# Patient Record
Sex: Female | Born: 1935 | Race: Black or African American | Hispanic: No | Smoking: Former smoker
Health system: Southern US, Community
[De-identification: ages and names within clinical notes are randomized; demographics above are authoritative.]

## PROBLEM LIST (undated history)

## (undated) DIAGNOSIS — H9190 Unspecified hearing loss, unspecified ear: Secondary | ICD-10-CM

## (undated) DIAGNOSIS — H269 Unspecified cataract: Secondary | ICD-10-CM

## (undated) DIAGNOSIS — E7800 Pure hypercholesterolemia, unspecified: Secondary | ICD-10-CM

## (undated) DIAGNOSIS — F1010 Alcohol abuse, uncomplicated: Secondary | ICD-10-CM

## (undated) DIAGNOSIS — H04123 Dry eye syndrome of bilateral lacrimal glands: Secondary | ICD-10-CM

## (undated) DIAGNOSIS — D649 Anemia, unspecified: Secondary | ICD-10-CM

## (undated) DIAGNOSIS — R4701 Aphasia: Secondary | ICD-10-CM

## (undated) DIAGNOSIS — N189 Chronic kidney disease, unspecified: Secondary | ICD-10-CM

## (undated) DIAGNOSIS — I10 Essential (primary) hypertension: Secondary | ICD-10-CM

## (undated) DIAGNOSIS — H9193 Unspecified hearing loss, bilateral: Secondary | ICD-10-CM

## (undated) DIAGNOSIS — E785 Hyperlipidemia, unspecified: Secondary | ICD-10-CM

## (undated) DIAGNOSIS — N184 Chronic kidney disease, stage 4 (severe): Secondary | ICD-10-CM

## (undated) DIAGNOSIS — F0390 Unspecified dementia without behavioral disturbance: Secondary | ICD-10-CM

## (undated) HISTORY — PX: CATARACT EXTRACTION W/ INTRAOCULAR LENS IMPLANT: SHX1309

## (undated) HISTORY — PX: EYE SURGERY: SHX253

## (undated) HISTORY — DX: Unspecified hearing loss, bilateral: H91.93

## (undated) HISTORY — DX: Aphasia: R47.01

## (undated) HISTORY — DX: Chronic kidney disease, stage 4 (severe): N18.4

---

## 1999-09-13 ENCOUNTER — Encounter: Admission: RE | Admit: 1999-09-13 | Discharge: 1999-09-13 | Payer: Self-pay | Admitting: Internal Medicine

## 1999-09-13 ENCOUNTER — Encounter: Payer: Self-pay | Admitting: Internal Medicine

## 2004-02-27 ENCOUNTER — Ambulatory Visit (HOSPITAL_COMMUNITY): Admission: RE | Admit: 2004-02-27 | Discharge: 2004-02-27 | Payer: Self-pay | Admitting: Family Medicine

## 2005-03-03 ENCOUNTER — Encounter: Admission: RE | Admit: 2005-03-03 | Discharge: 2005-03-03 | Payer: Self-pay | Admitting: Family Medicine

## 2006-02-03 ENCOUNTER — Inpatient Hospital Stay (HOSPITAL_COMMUNITY): Admission: EM | Admit: 2006-02-03 | Discharge: 2006-02-04 | Payer: Self-pay | Admitting: Emergency Medicine

## 2006-03-30 ENCOUNTER — Encounter: Admission: RE | Admit: 2006-03-30 | Discharge: 2006-03-30 | Payer: Self-pay | Admitting: Family Medicine

## 2006-07-14 ENCOUNTER — Emergency Department (HOSPITAL_COMMUNITY): Admission: EM | Admit: 2006-07-14 | Discharge: 2006-07-14 | Payer: Self-pay | Admitting: Family Medicine

## 2006-09-25 ENCOUNTER — Emergency Department (HOSPITAL_COMMUNITY): Admission: EM | Admit: 2006-09-25 | Discharge: 2006-09-25 | Payer: Self-pay | Admitting: Emergency Medicine

## 2007-01-27 ENCOUNTER — Inpatient Hospital Stay (HOSPITAL_COMMUNITY): Admission: EM | Admit: 2007-01-27 | Discharge: 2007-01-29 | Payer: Self-pay | Admitting: Emergency Medicine

## 2007-01-27 ENCOUNTER — Ambulatory Visit: Payer: Self-pay | Admitting: Internal Medicine

## 2007-04-24 ENCOUNTER — Encounter: Admission: RE | Admit: 2007-04-24 | Discharge: 2007-04-24 | Payer: Self-pay | Admitting: Family Medicine

## 2007-06-22 ENCOUNTER — Ambulatory Visit (HOSPITAL_COMMUNITY): Admission: RE | Admit: 2007-06-22 | Discharge: 2007-06-22 | Payer: Self-pay | Admitting: Orthopaedic Surgery

## 2007-12-18 ENCOUNTER — Encounter: Admission: RE | Admit: 2007-12-18 | Discharge: 2007-12-18 | Payer: Self-pay | Admitting: Family Medicine

## 2008-03-17 ENCOUNTER — Encounter: Admission: RE | Admit: 2008-03-17 | Discharge: 2008-03-17 | Payer: Self-pay | Admitting: Family Medicine

## 2008-04-08 ENCOUNTER — Emergency Department (HOSPITAL_COMMUNITY): Admission: EM | Admit: 2008-04-08 | Discharge: 2008-04-09 | Payer: Self-pay | Admitting: Emergency Medicine

## 2009-03-23 ENCOUNTER — Encounter: Admission: RE | Admit: 2009-03-23 | Discharge: 2009-03-23 | Payer: Self-pay | Admitting: Family Medicine

## 2009-05-15 ENCOUNTER — Emergency Department (HOSPITAL_COMMUNITY): Admission: EM | Admit: 2009-05-15 | Discharge: 2009-05-16 | Payer: Self-pay | Admitting: Emergency Medicine

## 2010-05-16 ENCOUNTER — Encounter: Payer: Self-pay | Admitting: Family Medicine

## 2010-07-12 LAB — DIFFERENTIAL
Basophils Absolute: 0 10*3/uL (ref 0.0–0.1)
Basophils Relative: 0 % (ref 0–1)
Eosinophils Absolute: 0.1 10*3/uL (ref 0.0–0.7)
Eosinophils Relative: 2 % (ref 0–5)
Lymphocytes Relative: 30 % (ref 12–46)
Lymphs Abs: 1.7 10*3/uL (ref 0.7–4.0)
Monocytes Absolute: 0.7 10*3/uL (ref 0.1–1.0)
Monocytes Relative: 12 % (ref 3–12)
Neutro Abs: 3.2 10*3/uL (ref 1.7–7.7)
Neutrophils Relative %: 57 % (ref 43–77)

## 2010-07-12 LAB — COMPREHENSIVE METABOLIC PANEL
ALT: 22 U/L (ref 0–35)
AST: 28 U/L (ref 0–37)
Albumin: 4 g/dL (ref 3.5–5.2)
Alkaline Phosphatase: 68 U/L (ref 39–117)
BUN: 13 mg/dL (ref 6–23)
CO2: 28 mEq/L (ref 19–32)
Calcium: 9.8 mg/dL (ref 8.4–10.5)
Chloride: 102 mEq/L (ref 96–112)
Creatinine, Ser: 1.29 mg/dL — ABNORMAL HIGH (ref 0.4–1.2)
GFR calc Af Amer: 49 mL/min — ABNORMAL LOW (ref >60)
GFR calc non Af Amer: 40 mL/min — ABNORMAL LOW (ref >60)
Glucose, Bld: 162 mg/dL — ABNORMAL HIGH (ref 70–99)
Potassium: 3.6 mEq/L (ref 3.5–5.1)
Sodium: 137 mEq/L (ref 135–145)
Total Bilirubin: 0.3 mg/dL (ref 0.3–1.2)
Total Protein: 7 g/dL (ref 6.0–8.3)

## 2010-07-12 LAB — CBC
HCT: 33.4 % — ABNORMAL LOW (ref 36.0–46.0)
Hemoglobin: 11.2 g/dL — ABNORMAL LOW (ref 12.0–15.0)
MCHC: 33.5 g/dL (ref 30.0–36.0)
MCV: 97.1 fL (ref 78.0–100.0)
Platelets: 272 10*3/uL (ref 150–400)
RBC: 3.44 MIL/uL — ABNORMAL LOW (ref 3.87–5.11)
RDW: 12.8 % (ref 11.5–15.5)
WBC: 5.7 10*3/uL (ref 4.0–10.5)

## 2010-07-12 LAB — URINALYSIS, ROUTINE W REFLEX MICROSCOPIC
Bilirubin Urine: NEGATIVE
Glucose, UA: NEGATIVE mg/dL
Hgb urine dipstick: NEGATIVE
Ketones, ur: NEGATIVE mg/dL
Nitrite: NEGATIVE
Protein, ur: NEGATIVE mg/dL
Specific Gravity, Urine: 1.01 (ref 1.005–1.030)
Urobilinogen, UA: 0.2 mg/dL (ref 0.0–1.0)
pH: 6.5 (ref 5.0–8.0)

## 2010-09-07 NOTE — Discharge Summary (Signed)
[name], [name]              ACCOUNT NO.:  [id number]   MEDICAL RECORD NO.:  [id number]          PATIENT TYPE:  INP   LOCATION:  [id number]                         FACILITY:  [place]   PHYSICIAN:  Evette Doffing, M.D.  DATE OF BIRTH:  August 18, 1932   DATE OF ADMISSION:  01/27/2007  DATE OF DISCHARGE:  01/29/2007                               DISCHARGE SUMMARY   DISCHARGE DIAGNOSES:  Include:  1. Right hip pain secondary to fall, no fractures.  2. Hyponatremia secondary to hydrochlorothiazide & possibly Lexapro,      now corrected.  3. Hypertension, controlled on atenolol.  4. Anemia, stable.  5. Dementia, no symptoms during this hospitalization.  6. Hyperlipidemia.  7. Cough.   DICHARGE MEDICATIONS  1. Atenolol 50 mg once daily  2. Zyprexa 2.5 mg every am and 5 mg at bedtime.  3. Advicor 1000/20 mg every evening  4. Aspirin 325 mg once daily  5. Aricept 10 mg daily at bedtime.  6. Tricor 145 mg once dialy  7. Ibuprofin 800mg  prn for pain  8. Docusate one tablet daily  9. Ost-Cal 500mg  daily  10.Risperdol 2mg  daily at bedtime.   Patient was advised to stop taking the following medications: HCTZ (2/2  low sodium), Lexapro (2/2 low sodium), Lisinopril (2/2 cough).   DISPOSITION:  Patient is being discharged in very good condition back to  her residence at [facility].  She will be following up with  Dr. Silvio Pate of [facility] on October 14.  At the nursing  home, her B-Met needs to be checked at least weekly until stability of  her sodium is confirmed.   PROCEDURES PERFORMED DURING THIS HOSPITALIZATION:  Include:  1. Chest x-ray October 4:  No acute pulmonary process, stable      cardiomegaly.  2. Pelvic x-ray October 4 which shows osteopenia, no bone abnormality.  3. Abdominal x-ray October 4 shows nonobstructive bowel gas pattern.  4. Left shoulder x-ray October 4 shows mild degenerative changes      including small spurs about the acromioclavicular  joint, small      subchondral cyst or geode in humeral head, negative for fracture      dislocation.  5. X-ray right hip October 4:  No fracture dislocation.  6. X-ray right ribs October 4:  Shallow lung volumes, no displaced rib      fracture or pneumothorax.   There were no consultations during this hospitalization.   ADMITTING HISTORY AND PHYSICAL IS AS FOLLOWS:  [name] is a 77-year-  old, female, nursing home resident with past medical history of  hypertension, anemia, hyperlipidemia, dementia, who is currently on low-  salt diet at her nursing home.  She presents after a fall complaining of  abdominal/hip pain.  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  Patient fell in the morning of admission while getting out  of the shower.  She landed on her right hip; there was no trauma to her  head, no pain anywhere else.  Right hip is sore, has full range of  motion, no bruising, no  abrasion.  She can still hold weight on the  right hip.  She reports that she did feel lightheaded while falling with  no loss of consciousness.  No nausea, vomiting or diarrhea in the past  days.  No shortness of breath, no chest pain, no palpitations.  Patient  reports she has been eating and drinking well and has been feeling fine  in past days.  She does report a cough at night prior to admission  productive of a thick clear sputum.  She reports occasional subjective  fevers, also leg cramping in the calves and feet.  Patient has chronic  left shoulder pain which is achy in nature.  In the emergency room, labs  showed the patient to be hyponatremic with a sodium of 125, chloride of  91.  Patient was admitted mainly for electrolyte correction.   On admission, sodium 125, potassium 3.5, chloride 91, bicarb 27, BUN 11,  creatinine 1.2, glucose 117, bilirubin 0.8, alk phos 59, SGOT 40, SGPT  24, protein 6.9, albumin 4.2, calcium 10.  White blood cell count 4.2,  hemoglobin 10.4,  hematocrit 30.4, platelets 365 with an MCV of 93.0.  UA  with nitrite and leukocyte esterase-negative.   VITALS ON PRESENTATION:  Temperature 97.9, blood pressure 119/78, pulse  58, respirations 20.  Patient was sating 99% on room air.   EXAMINATION:  Benign except for tenderness in the right hip to  palpation, no bruising, no edema, full range of motion, strength 5/5.   HOSPITAL COURSE:  1. Hyponatremia, corrected on 200 ml per hour of normal saline for 48      hours.  HCTZ has been discontinued as has the patient's      prescription for Lexapro, as Lexapro can also contribute to      hyponatremia.  My recommendation is for a normal diet at the      nursing home and that she remains off diuretics and Lexapro.  2. Hip pain.  No fracture on x-ray.  Patient has been up and walking      in hospital.  She states her pain to be minimal.  Continue      ibuprofen p.r.n.  3. Hypertension.  Patient is 144/85 on day of discharge.  This is      increased after fluid supplementation, also with the      discontinuation of her hydrochlorothiazide and lisinopril.  In the      future, may consider an ARB for blood pressure control rather than      an ACE, as patient's reports cough and suggests cough is worse when      she is on lisinopril.  Patient should continue atenolol and blood      pressure to be managed in the outpatient setting.  4. Anemia.  Stable throughout this hospitalization.  5. Dementia.  Patient showed absolutely no signs or symptoms of      dementia during this hospitalization.  Previous records question of      Alzheimer disease versus multi-infarct dementia.  During this      hospitalization, patient has been alert, oriented and appropriate.      Lexapro has been discontinued.  Patient continues on Aricept,      Risperdal and Zyprexa as well as Trazodone.  My recommendation      would be to discontinue one or several of these medications, as      this patient does not seem to  exhibit any signs or symptoms of  dementia/psychosis/depression.  6. Hyperlipidemia.  This issue was not explored during this      hospitalization.  Patient should continue on Loiza.  7. Cough.  Patient suggests that cough is worse when on lisinopril.      Lisinopril has been discontinued.  Patient still has cough, but has      only been off lisinopril for less than 24 hours.  Chest x-ray shows      lungs to be clear.  There are no fevers, no sputum production, no      increase in white blood cell count.   This patient is being discharged in stable condition back to her  residence at [facility].   On day of discharge, sodium 136, potassium 3.7, chloride 105, bicarb 26,  BUN 8, creatinine 1.08, glucose 106, calcium 9.3, hemoglobin 9.1,  hematocrit 26.8, white blood cell count 3.8, platelet count 328,  phosphorous 2.7, magnesium 2.1.   Other labs of interest during this hospitalization:  TSH was shown to be  low at 0.23, slightly low for the parameters of this hospital.  Suggest  that free T4, free T3 be checked to rule out hyperthyroidism.      Myrtis Ser, MD  Electronically Signed      Evette Doffing, M.D.  Electronically Signed    CW/MEDQ  D:  01/29/2007  T:  01/29/2007  Job:  [id number]

## 2010-09-07 NOTE — Op Note (Signed)
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   PROCEDURE:  Open reduction and pinning of left middle finger distal  interphalangeal joint and removal of necrotic bone.   SURGEON:  Sierra Guest. Ninfa Linden, MD   ANESTHESIA:  1. Left arm Bier block.  2. Sensorcaine 0.25% plain digital block, left middle finger.   TOURNIQUET TIME:  56 minutes.   BLOOD LOSS:  Minimal.   COMPLICATIONS:  None.   INDICATIONS:  Briefly, [name] is a 77 year old deaf individual who  sometime last fall sustained an injury to her left middle finger.  X-  rays were eventually obtained and this showed a bony mallet deformity.  She eventually presented to my office with wanting to have this fixed.  It was uncomfortable and painful to her at the DIP joint.  I talked to  her and the family at length about the likelihood of needing to fuse  this joint but that we would try a surgical intervention.  The risks and  benefits of this including, again, the risk of needing to fuse the  joint, were explained and understood and they agreed to proceed with  surgery.   PROCEDURE DESCRIPTION:  After informed consent was obtained, the  appropriate left arm was marked.  She was brought to the operating room  and placed supine on the operating table.  A Bier block was obtained  through the forearm tourniquet.  The arm was prepped and draped with  DuraPrep and sterile drapes.  A time-out was  called and she was  identified as the correct patient and correct extremity.  I then made an  incision directly over the DIP joint and there was a large bony fragment  that was encountered after I divided the extensor tendon.  The extensor  tendon was intact, though.  The bony fragment was necrotic and I had to  remove it in its entirety.  I then made a decision to attempt to pin the  joint without fusing it because there was some residual cartilage that  was still remaining in the palmar aspect of the DIP joint.  I placed a  single 0.045 Kirschner wire from the tip of the finger in a retrograde  manner through the distal phalanx into the middle phalanx.  This did  hold the finger in an anatomic position.  I then irrigated the wound,  cut the pin off at the skin and put a pin cap.  I then closed the  extensor tendon with 3-0 Ethibond suture followed by 3-0 nylon suture in  the skin.  The digital block was  obtained with 0.25% __________ .  I then placed Xeroform and a well-padded sterile dressing including a  splint that also keeps the finger in an extended position of the DIP  joint.  The patient was taken to the recovery room in stable condition.  All final counts were correct and there were no complications noted.      [name], M.D.  Electronically Signed     CYB/MEDQ  D:  06/22/2007  T:  06/23/2007  Job:  [id number]

## 2010-09-10 NOTE — H&P (Signed)
NAMENAVREET, FORINO              ACCOUNT NO.:  [id number]   MEDICAL RECORD NO.:  [id number]          PATIENT TYPE:  EMS   LOCATION:  MAJO                         FACILITY:  [place]   PHYSICIAN:  Mobolaji B. Bakare, M.D.DATE OF BIRTH:  Nov 08, 1932   DATE OF ADMISSION:  02/02/2006  DATE OF DISCHARGE:                                HISTORY & PHYSICAL   PRIMARY CARE PHYSICIAN:  Dr. Lisbeth Ply.   The patient is a resident of [facility].   CHIEF COMPLAINT:  Chest pain.   HISTORY OF PRESENTING COMPLAINT:  [name] is a 77 year old African-  American female who has been deaf and mute since childhood.  This afternoon  she complained of chest pain radiating to her left arm.  She felt cold but  not diaphoretic.  There was no nausea or vomiting associated with it, but  she did feel palpitation.  EMS was called to the scene.  The patient  received 3 nitroglycerin and chest pain improved remarkably, and she was  brought to the emergency room for further evaluation.  She is currently  chest pain free.  Initial EKG showed normal sinus rhythm with no acute ST  changes.  Cardiac markers at the point of care have been negative so far.  The patient is being admitted to rule out myocardial infarction.   Most of the history was obtained via interpretation by daughter.   REVIEW OF SYSTEMS:  She denies fever.  No shortness of breath.  No cough,  orthopnea, PND.  No abdominal pain, constipation, diarrhea.   PAST MEDICAL HISTORY:  1. Dementia.  2. Schizophrenia.  3. Adjustment disorder.  4. History of alcohol abuse.  5. Hypertension.  6. History of anemia.  7. Hypercholesterolemia.   CURRENT MEDICATIONS:  Include:  1. Aricept 10 mg p.o. q.h.s.  2. Risperdal 2 mg q.h.s.  3. Zyprexa 5 mg q.h.s., and 2.5 mg q. a.m.  4. Trazodone 50 mg p.o. q.h.s.  5. Advicor 20/500 mg 1 p.o. q.h.s.  6. Ecotrin 325 mg q.h.s.  7. Tenormin 50 mg p.o. daily.  8. Oyster 500 mg 1 tablet daily.  9. TriCor  145 mg p.o. daily.  10.Lexapro 10 mg p.o. daily.  11.Maxzide 37.5/25 mg 1 p.o. daily.  12.Ferrous sulfate elixir 5 mL p.o. t.i.d.  13.Colace 100 mg p.o. q.h.s.  14.Therems 1 p.o. daily.   ALLERGIES:  No known drug allergies.   FAMILY HISTORY:  Noncontributory.   SOCIAL HISTORY:  The patient is a resident of [facility].  She is  fairly independent with activities of daily living.  Limitations include  verbal communication and she is deaf.  She uses sign language.   INITIAL VITALS:  Temperature 98.7, blood pressure 103/73, pulse of 59,  respiratory rate of 16, O2 sat of 98%.   PHYSICAL EXAMINATION:  GENERAL:  The patient appears comfortable, not in  respiratory distress.  HEENT:  Normocephalic, atraumatic head.  Pupils equal, round, and reactive  to light.  NECK:  No elevated JVD.  No carotid bruits.  LUNGS:  Clear clinically to auscultation.  CVS:  S1, S2,  regular.  No murmur.  No gallop.  ABDOMEN:  Obese, soft, nontender.  Bowel sounds present.  EXTREMITIES:  No pedal edema.  No calf tenderness.  Dorsalis pedis pulses 2+  bilaterally.  CNS:  Nonfocal.   INITIAL LABORATORY DATA:  Cardiac markers at the point of care:  First set  showed myoglobin of 215, troponin and CK-MB normal; second set showed  myoglobin of 245 but troponin and CK-MB are also normal.  Sodium 132,  potassium 3.3, chloride 98, glucose 116, BUN 17, creatinine 1.6.  White cell  5.1, hemoglobin 11.1, hematocrit 32.2, platelets 356.  Normal differential.  Chest x-ray showed no acute cardiopulmonary abnormality.  Borderline  cardiomegaly.  No overt pulmonary edema or focal infiltrates.   ASSESSMENT:  1. Chest pain rule out myocardial infarction.  2. Hypertension.  3. Dementia.  4. History of schizophrenia.  5. Hard of hearing/mute.  6. Hypokalemia.  7. Renal insufficiency.   PLAN:  Admit to Telemetry Floor.  Cycle cardiac enzymes.  Nitro paste 1/2  inch every 4 hours.  Aspirin 325 mg p.o. daily.   We will resume all other  home medications including beta blocker.      Mobolaji B. Maia Petties, M.D.  Electronically Signed    MBB/MEDQ  D:  02/03/2006  T:  02/03/2006  Job:  [id number]   cc:   Dr. Lisbeth Ply

## 2010-12-22 ENCOUNTER — Emergency Department (HOSPITAL_COMMUNITY): Payer: Medicare Other

## 2010-12-22 ENCOUNTER — Inpatient Hospital Stay (HOSPITAL_COMMUNITY)
Admission: EM | Admit: 2010-12-22 | Discharge: 2010-12-24 | DRG: 149 | Disposition: A | Discharge status: Home Health | Payer: Medicare Other | Source: Ambulatory Visit | Attending: Internal Medicine | Admitting: Internal Medicine

## 2010-12-22 ENCOUNTER — Other Ambulatory Visit (HOSPITAL_COMMUNITY): Payer: Medicare Other

## 2010-12-22 DIAGNOSIS — I129 Hypertensive chronic kidney disease with stage 1 through stage 4 chronic kidney disease, or unspecified chronic kidney disease: Secondary | ICD-10-CM | POA: Diagnosis present

## 2010-12-22 DIAGNOSIS — M25569 Pain in unspecified knee: Secondary | ICD-10-CM | POA: Diagnosis present

## 2010-12-22 DIAGNOSIS — F068 Other specified mental disorders due to known physiological condition: Secondary | ICD-10-CM | POA: Diagnosis present

## 2010-12-22 DIAGNOSIS — H8190 Unspecified disorder of vestibular function, unspecified ear: Principal | ICD-10-CM | POA: Diagnosis present

## 2010-12-22 DIAGNOSIS — E119 Type 2 diabetes mellitus without complications: Secondary | ICD-10-CM | POA: Diagnosis present

## 2010-12-22 DIAGNOSIS — H9190 Unspecified hearing loss, unspecified ear: Secondary | ICD-10-CM | POA: Diagnosis present

## 2010-12-22 DIAGNOSIS — F1010 Alcohol abuse, uncomplicated: Secondary | ICD-10-CM | POA: Diagnosis present

## 2010-12-22 DIAGNOSIS — N189 Chronic kidney disease, unspecified: Secondary | ICD-10-CM | POA: Diagnosis present

## 2010-12-22 DIAGNOSIS — E7800 Pure hypercholesterolemia, unspecified: Secondary | ICD-10-CM | POA: Diagnosis present

## 2010-12-22 LAB — BASIC METABOLIC PANEL
BUN: 14 mg/dL (ref 6–23)
CO2: 30 mEq/L (ref 19–32)
Calcium: 10.4 mg/dL (ref 8.4–10.5)
Chloride: 101 mEq/L (ref 96–112)
Creatinine, Ser: 1.12 mg/dL — ABNORMAL HIGH (ref 0.50–1.10)
GFR calc Af Amer: 57 mL/min — ABNORMAL LOW (ref >60)
GFR calc non Af Amer: 47 mL/min — ABNORMAL LOW (ref >60)
Glucose, Bld: 87 mg/dL (ref 70–99)
Potassium: 3.6 mEq/L (ref 3.5–5.1)
Sodium: 138 mEq/L (ref 135–145)

## 2010-12-22 LAB — CARDIAC PANEL(CRET KIN+CKTOT+MB+TROPI)
CK, MB: 7.5 ng/mL (ref 0.3–4.0)
Relative Index: 1.2 (ref 0.0–2.5)
Total CK: 609 U/L — ABNORMAL HIGH (ref 7–177)
Troponin I: <0.3 ng/mL (ref <0.30)

## 2010-12-22 LAB — URINALYSIS, ROUTINE W REFLEX MICROSCOPIC
Bilirubin Urine: NEGATIVE
Glucose, UA: NEGATIVE mg/dL
Hgb urine dipstick: NEGATIVE
Ketones, ur: NEGATIVE mg/dL
Leukocytes, UA: NEGATIVE
Nitrite: NEGATIVE
Protein, ur: NEGATIVE mg/dL
Specific Gravity, Urine: 1.007 (ref 1.005–1.030)
Urobilinogen, UA: 0.2 mg/dL (ref 0.0–1.0)
pH: 7 (ref 5.0–8.0)

## 2010-12-22 LAB — CBC
HCT: 31.7 % — ABNORMAL LOW (ref 36.0–46.0)
Hemoglobin: 11.1 g/dL — ABNORMAL LOW (ref 12.0–15.0)
MCH: 32.6 pg (ref 26.0–34.0)
MCHC: 35 g/dL (ref 30.0–36.0)
MCV: 93 fL (ref 78.0–100.0)
Platelets: 246 10*3/uL (ref 150–400)
RBC: 3.41 MIL/uL — ABNORMAL LOW (ref 3.87–5.11)
RDW: 12.5 % (ref 11.5–15.5)
WBC: 5 10*3/uL (ref 4.0–10.5)

## 2010-12-22 LAB — GLUCOSE, CAPILLARY: Glucose-Capillary: 100 mg/dL — ABNORMAL HIGH (ref 70–99)

## 2010-12-22 LAB — DIFFERENTIAL
Basophils Absolute: 0 10*3/uL (ref 0.0–0.1)
Basophils Relative: 0 % (ref 0–1)
Eosinophils Absolute: 0.1 10*3/uL (ref 0.0–0.7)
Eosinophils Relative: 1 % (ref 0–5)
Lymphocytes Relative: 42 % (ref 12–46)
Lymphs Abs: 2.1 10*3/uL (ref 0.7–4.0)
Monocytes Absolute: 0.5 10*3/uL (ref 0.1–1.0)
Monocytes Relative: 11 % (ref 3–12)
Neutro Abs: 2.3 10*3/uL (ref 1.7–7.7)
Neutrophils Relative %: 46 % (ref 43–77)

## 2010-12-22 LAB — POCT I-STAT TROPONIN I: Troponin i, poc: 0 ng/mL (ref 0.00–0.08)

## 2010-12-23 ENCOUNTER — Inpatient Hospital Stay (HOSPITAL_COMMUNITY): Payer: Medicare Other

## 2010-12-23 ENCOUNTER — Other Ambulatory Visit (HOSPITAL_COMMUNITY): Payer: Medicare Other

## 2010-12-23 LAB — LIPID PANEL
Cholesterol: 127 mg/dL (ref 0–200)
HDL: 49 mg/dL (ref >39)
LDL Cholesterol: 61 mg/dL (ref 0–99)
Total CHOL/HDL Ratio: 2.6 RATIO
Triglycerides: 84 mg/dL (ref <150)
VLDL: 17 mg/dL (ref 0–40)

## 2010-12-23 LAB — GLUCOSE, CAPILLARY
Glucose-Capillary: 193 mg/dL — ABNORMAL HIGH (ref 70–99)
Glucose-Capillary: 126 mg/dL — ABNORMAL HIGH (ref 70–99)

## 2010-12-23 LAB — COMPREHENSIVE METABOLIC PANEL
ALT: 18 U/L (ref 0–35)
AST: 21 U/L (ref 0–37)
Albumin: 3.7 g/dL (ref 3.5–5.2)
Alkaline Phosphatase: 64 U/L (ref 39–117)
BUN: 13 mg/dL (ref 6–23)
CO2: 31 mEq/L (ref 19–32)
Calcium: 9.9 mg/dL (ref 8.4–10.5)
Chloride: 102 mEq/L (ref 96–112)
Creatinine, Ser: 1.08 mg/dL (ref 0.50–1.10)
GFR calc Af Amer: 60 mL/min — ABNORMAL LOW (ref >60)
GFR calc non Af Amer: 49 mL/min — ABNORMAL LOW (ref >60)
Glucose, Bld: 118 mg/dL — ABNORMAL HIGH (ref 70–99)
Potassium: 3.6 mEq/L (ref 3.5–5.1)
Sodium: 137 mEq/L (ref 135–145)
Total Bilirubin: 0.3 mg/dL (ref 0.3–1.2)
Total Protein: 6.6 g/dL (ref 6.0–8.3)

## 2010-12-23 LAB — HEMOGLOBIN A1C
Hgb A1c MFr Bld: 7.5 % — ABNORMAL HIGH (ref <5.7)
Hgb A1c MFr Bld: 7.1 % — ABNORMAL HIGH (ref <5.7)
Mean Plasma Glucose: 169 mg/dL — ABNORMAL HIGH (ref <117)
Mean Plasma Glucose: 157 mg/dL — ABNORMAL HIGH (ref <117)

## 2010-12-23 LAB — CARDIAC PANEL(CRET KIN+CKTOT+MB+TROPI)
CK, MB: 5.9 ng/mL — ABNORMAL HIGH (ref 0.3–4.0)
CK, MB: 7.1 ng/mL (ref 0.3–4.0)
Relative Index: 1.2 (ref 0.0–2.5)
Relative Index: 1.3 (ref 0.0–2.5)
Total CK: 510 U/L — ABNORMAL HIGH (ref 7–177)
Total CK: 528 U/L — ABNORMAL HIGH (ref 7–177)
Troponin I: <0.3 ng/mL (ref <0.30)
Troponin I: <0.3 ng/mL (ref <0.30)

## 2010-12-23 LAB — CBC
HCT: 30.4 % — ABNORMAL LOW (ref 36.0–46.0)
Hemoglobin: 10.4 g/dL — ABNORMAL LOW (ref 12.0–15.0)
MCH: 31.7 pg (ref 26.0–34.0)
MCHC: 34.2 g/dL (ref 30.0–36.0)
MCV: 92.7 fL (ref 78.0–100.0)
Platelets: 251 10*3/uL (ref 150–400)
RBC: 3.28 MIL/uL — ABNORMAL LOW (ref 3.87–5.11)
RDW: 12.4 % (ref 11.5–15.5)
WBC: 5.3 10*3/uL (ref 4.0–10.5)

## 2010-12-23 LAB — MAGNESIUM: Magnesium: 2 mg/dL (ref 1.5–2.5)

## 2010-12-23 LAB — MRSA PCR SCREENING: MRSA by PCR: NEGATIVE

## 2010-12-23 LAB — PHOSPHORUS: Phosphorus: 3.3 mg/dL (ref 2.3–4.6)

## 2010-12-23 MED ORDER — GADOBENATE DIMEGLUMINE 529 MG/ML IV SOLN
20.0000 mL | Freq: Once | INTRAVENOUS | Status: AC
  Administered 2010-12-23: 20 mL via INTRAVENOUS

## 2010-12-24 LAB — BASIC METABOLIC PANEL
BUN: 15 mg/dL (ref 6–23)
CO2: 31 mEq/L (ref 19–32)
Calcium: 10.1 mg/dL (ref 8.4–10.5)
Chloride: 101 mEq/L (ref 96–112)
Creatinine, Ser: 1.11 mg/dL — ABNORMAL HIGH (ref 0.50–1.10)
GFR calc Af Amer: 58 mL/min — ABNORMAL LOW (ref >60)
GFR calc non Af Amer: 48 mL/min — ABNORMAL LOW (ref >60)
Glucose, Bld: 135 mg/dL — ABNORMAL HIGH (ref 70–99)
Potassium: 3.8 mEq/L (ref 3.5–5.1)
Sodium: 138 mEq/L (ref 135–145)

## 2010-12-25 NOTE — Discharge Summary (Signed)
[name], [name] NO.:  [id number]  MEDICAL RECORD NO.:  [id number]  LOCATION:  62                         FACILITY:  [place]  PHYSICIAN:  Sherryl Manges, M.D.  DATE OF BIRTH:  02/28/1933  DATE OF ADMISSION:  12/22/2010 DATE OF DISCHARGE:  12/24/2010                              DISCHARGE SUMMARY   PRIMARY CARE PHYSICIAN:  Daiva Eves, MD  DISCHARGE DIAGNOSES: 1. Postural dizziness, likely vestibular in etiology. 2. Hypertension. 3. Dementia. 4. Deafness. 5. Dyslipidemia. 6. Osteoarthritis/degenerative disk disease. 7. Type 2 diabetes mellitus.  DISCHARGE MEDICATIONS: 1. Meclizine 12.5 mg p.o. t.i.d. for 14 days. 2. Tramadol 50 mg p.o. t.i.d. 3. Acetaminophen 1 g p.o. p.r.n. t.i.d. for pain. 4. Carvedilol 25 mg p.o. b.i.d. 5. Clonidine 0.3 mg p.o. b.i.d. 6. Aricept 10 mg p.o. at bedtime. 7. Doxazosin 8 mg p.o. q.evening. 8. Citalopram 10 mg p.o. daily. 9. Glimepiride 4 mg p.o. b.i.d. 10.Ibuprofen 600 mg p.o. p.r.n. t.i.d. for pain. 11.Losartan 100 mg p.o. daily. 12.Lovastatin 40 mg p.o. q.evening. 13.Niaspan 1000 mg p.o. q.evening. 14.Olanzapine 2.5 mg p.o. daily. 15.Olanzapine 5 mg p.o. q.evening. 16.Risperdal 2 mg p.o. q.evening. 17.Trazodone 5 mg p.o. q.evening. 18.Trilipix 135 mg p.o. daily.  PROCEDURES: 1. Head CT scan, December 18, 2010.  This showed chronic microvascular     ischemia.  No acute abnormality. 2. X-ray left knee, December 22, 2010.  This showed left knee     osteoarthritis.  No acute osseous findings. 3. X-ray right shoulder, December 22, 2010.  This was negative for     fracture, although there was rotator cuff disease with narrowing of     acromiohumeral distance.  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 4. X-ray of the right humerus, December 22, 2010, this was negative for  fracture. 5. X-ray of the hip, December 22, 2010, this showed mild degenerative     changes bilateral hips.  No acute bony abnormality. 6. Brain MRI, December 23, 2010, this showed no acute findings in the     brain that was mild to moderate for age, nonspecific, signal     changes most commonly due to small vessel disease, suspicious of     abnormal marrow signal in the posterior skull in the circumference     of the lambdoid suture associated with sclerosis on the comparison     CT; however, the finding was present in January 2011 and only mild     depending progression since that time as the case is an aggressive     process. 7. Brain MRA December 23, 2010.  This was a negative intracranial MRA. 8. MRA of the neck, December 23, 2010.  This showed no significant     carotid stenosis, left vertebral artery was dominant, irregular     right vertebral artery may be hypoplastic to contain some     atherosclerotic disease. 9. Brain MRI with contrast, December 23, 2010.  This was negative for     metastatic disease to the brain, noted there was an enhancing  sclerotic lesion in the occipital bone bilaterally, right greater     than left.  CONSULTATIONS:  None.  ADMISSION HISTORY:  As in H&P notes of December 22, 2010, dictated by Dr. Donia Ast.  However, in brief this is a 77 year old female, with known history of deafness, hypertension, chronic renal insufficiency with baseline creatinine of 1.2, dyslipidemia, type 2 diabetes mellitus, dementia, osteoarthritis/degenerative joint disease, presenting with postural dizziness for 1 day.  She had no associated focal neurology. She was subsequently admitted for evaluation, investigation, and management.  CLINICAL COURSE: 1. Postural dizziness.  The patient presented as described above.  Of     course, communication was difficult, as she communicates with     sign language and also by means of written notes.  However, she     underwent brain  imaging studies as well as head and neck MRA.  The     imaging studies, were unremarkable for acute pathology.  She     was commenced on meclizine and was evaluated by PT, OT.  During the     course of her hospitalization she had no recurrences of dizziness.  2. Dyslipidemia.  The patient's lipid profile is as follows.  Total     cholesterol 127, triglyceride 84, HDL 49, LDL 61, i.e., excellent     lipid profile.  She has been reassured accordingly.  3. Hypertension.  The patient is on multiple hypertensive medications     and she remained normotensive during the course of this     hospitalization except in the a.m. of December 24, 2010, when she had     a bump in BP, which resolved after administration of a.m.     antihypertensive medications.  4. Osteoarthritis.  The patient has degenerative joint disease.  The     patient did complain of left knee pain at the time of presentation,     physical examination demonstrated osteoarthritic changes.  No     significant effusion.  She was managed with analgesic medication.     Imaging studies showed DJD of the knee, no other acute findings.     She did complain also of right shoulder pain and imaging studies     demonstrated possible humeral head avascular necrosis as well as     severe degenerative changes.  She has been managed with analgesic     medications and physiotherapy with appreciable relief.  5. Type 2 diabetes mellitus.  The patient remained euglycemic during     the course of this hospitalization.  Hemoglobin A1c was 7.1.  6. Dementia.  The patient continues on preadmission psychotropic     medications.  DISPOSITION:  The patient on December 24, 2010, felt considerably better. Right shoulder and left knee pains have considerably ameliorated and she has had no dizziness during the course of this hospitalization.  She was, therefore, considered clinically stable for discharge and discharged accordingly.  Per PT/OT, the patient  will benefit from vestibular rehabilitation in the assisted living facility.  This has therefore been arranged.  The patient was discharged on December 24, 2010.  ACTIVITY:  As tolerated.  Recommended to increase activity slowly, otherwise per PT/OT.  DIET:  Heart-healthy/carbohydrate modified.  FOLLOWUP INSTRUCTIONS:  The patient will follow up routinely with her primary MD, Dr. Daiva Eves.  SPECIAL INSTRUCTIONS:  Home health PT/OT as well as vestibular rehabilitation has been arranged.     Sherryl Manges, M.D.     CO/MEDQ  D:  12/24/2010  T:  12/24/2010  Job:  [id number]  cc:   Daiva Eves, MD  Electronically Signed by Sherryl Manges M.D. on 12/25/2010 07:27:14 PM

## 2011-01-14 LAB — CBC
HCT: 32.2 — ABNORMAL LOW
Hemoglobin: 11.1 — ABNORMAL LOW
MCHC: 34.5
MCV: 95.6
Platelets: 288
RBC: 3.37 — ABNORMAL LOW
RDW: 12.8
WBC: 5.3

## 2011-01-14 LAB — BASIC METABOLIC PANEL
BUN: 12
CO2: 28
Calcium: 10.3
Chloride: 103
Creatinine, Ser: 1.09
GFR calc Af Amer: 60 — ABNORMAL LOW
GFR calc non Af Amer: 49 — ABNORMAL LOW
Glucose, Bld: 127 — ABNORMAL HIGH
Potassium: 4
Sodium: 138

## 2011-01-28 LAB — POCT I-STAT, CHEM 8
BUN: 15 mg/dL (ref 6–23)
Calcium, Ion: 1.09 mmol/L — ABNORMAL LOW (ref 1.12–1.32)
Chloride: 105 mEq/L (ref 96–112)
Creatinine, Ser: 1.5 mg/dL — ABNORMAL HIGH (ref 0.4–1.2)
Glucose, Bld: 114 mg/dL — ABNORMAL HIGH (ref 70–99)
HCT: 37 % (ref 36.0–46.0)
Hemoglobin: 12.6 g/dL (ref 12.0–15.0)
Potassium: 3.7 mEq/L (ref 3.5–5.1)
Sodium: 137 mEq/L (ref 135–145)
TCO2: 25 mmol/L (ref 0–100)

## 2011-01-28 LAB — TYPE AND SCREEN
ABO/RH(D): O POS
Antibody Screen: NEGATIVE

## 2011-01-28 LAB — URINE MICROSCOPIC-ADD ON

## 2011-01-28 LAB — URINALYSIS, ROUTINE W REFLEX MICROSCOPIC
Bilirubin Urine: NEGATIVE
Glucose, UA: NEGATIVE mg/dL
Ketones, ur: NEGATIVE mg/dL
Nitrite: NEGATIVE
Protein, ur: NEGATIVE mg/dL
Specific Gravity, Urine: 1.016 (ref 1.005–1.030)
Urobilinogen, UA: 0.2 mg/dL (ref 0.0–1.0)
pH: 7 (ref 5.0–8.0)

## 2011-01-28 LAB — ABO/RH: ABO/RH(D): O POS

## 2011-01-28 LAB — OCCULT BLOOD X 1 CARD TO LAB, STOOL: Fecal Occult Bld: NEGATIVE

## 2011-02-03 LAB — COMPREHENSIVE METABOLIC PANEL
ALT: 21
ALT: 24
AST: 33
AST: 40 — ABNORMAL HIGH
Albumin: 3.9
Albumin: 4.2
Alkaline Phosphatase: 55
Alkaline Phosphatase: 59
BUN: 11
BUN: 12
CO2: 24
CO2: 27
Calcium: 10
Calcium: 9.3
Chloride: 91 — ABNORMAL LOW
Chloride: 98
Creatinine, Ser: 1.22 — ABNORMAL HIGH
Creatinine, Ser: 1.24 — ABNORMAL HIGH
GFR calc Af Amer: 51 — ABNORMAL LOW
GFR calc Af Amer: 52 — ABNORMAL LOW
GFR calc non Af Amer: 43 — ABNORMAL LOW
GFR calc non Af Amer: 43 — ABNORMAL LOW
Glucose, Bld: 111 — ABNORMAL HIGH
Glucose, Bld: 117 — ABNORMAL HIGH
Potassium: 3.5
Potassium: 4.1
Sodium: 125 — ABNORMAL LOW
Sodium: 126 — ABNORMAL LOW
Total Bilirubin: 0.7
Total Bilirubin: 0.8
Total Protein: 6.4
Total Protein: 6.9

## 2011-02-03 LAB — CBC
HCT: 26.8 — ABNORMAL LOW
HCT: 29 — ABNORMAL LOW
HCT: 30 — ABNORMAL LOW
Hemoglobin: 10.4 — ABNORMAL LOW
Hemoglobin: 9.1 — ABNORMAL LOW
Hemoglobin: 9.9 — ABNORMAL LOW
MCHC: 34
MCHC: 34
MCHC: 34.7
MCV: 93
MCV: 94.3
MCV: 94.3
Platelets: 328
Platelets: 365
Platelets: 402 — ABNORMAL HIGH
RBC: 2.84 — ABNORMAL LOW
RBC: 3.08 — ABNORMAL LOW
RBC: 3.23 — ABNORMAL LOW
RDW: 14.1 — ABNORMAL HIGH
RDW: 14.4 — ABNORMAL HIGH
RDW: 14.5 — ABNORMAL HIGH
WBC: 3.8 — ABNORMAL LOW
WBC: 4.2
WBC: 5.1

## 2011-02-03 LAB — BASIC METABOLIC PANEL
BUN: 11
BUN: 12
BUN: 8
CO2: 24
CO2: 25
CO2: 26
Calcium: 9
Calcium: 9.3
Calcium: 9.6
Chloride: 105
Chloride: 93 — ABNORMAL LOW
Chloride: 97
Creatinine, Ser: 1.08
Creatinine, Ser: 1.13
Creatinine, Ser: 1.27 — ABNORMAL HIGH
GFR calc Af Amer: 50 — ABNORMAL LOW
GFR calc Af Amer: 57 — ABNORMAL LOW
GFR calc Af Amer: >60
GFR calc non Af Amer: 41 — ABNORMAL LOW
GFR calc non Af Amer: 47 — ABNORMAL LOW
GFR calc non Af Amer: 50 — ABNORMAL LOW
Glucose, Bld: 106 — ABNORMAL HIGH
Glucose, Bld: 107 — ABNORMAL HIGH
Glucose, Bld: 109 — ABNORMAL HIGH
Potassium: 3.6
Potassium: 3.7
Potassium: 3.8
Sodium: 125 — ABNORMAL LOW
Sodium: 133 — ABNORMAL LOW
Sodium: 136

## 2011-02-03 LAB — PHOSPHORUS: Phosphorus: 2.7

## 2011-02-03 LAB — SODIUM, URINE, RANDOM
Sodium, Ur: 28
Sodium, Ur: <10

## 2011-02-03 LAB — URINALYSIS, ROUTINE W REFLEX MICROSCOPIC
Bilirubin Urine: NEGATIVE
Glucose, UA: NEGATIVE
Hgb urine dipstick: NEGATIVE
Ketones, ur: NEGATIVE
Nitrite: NEGATIVE
Protein, ur: NEGATIVE
Specific Gravity, Urine: 1.01
Urobilinogen, UA: 0.2
pH: 7

## 2011-02-03 LAB — DIFFERENTIAL
Basophils Absolute: 0
Basophils Relative: 1
Eosinophils Absolute: 0.1
Eosinophils Relative: 1
Lymphocytes Relative: 41
Lymphs Abs: 1.7
Monocytes Absolute: 0.5
Monocytes Relative: 11
Neutro Abs: 1.9
Neutrophils Relative %: 46

## 2011-02-03 LAB — T4, FREE: Free T4: 1.14

## 2011-02-03 LAB — CREATININE, URINE, RANDOM
Creatinine, Urine: 49
Creatinine, Urine: 53.2

## 2011-02-03 LAB — T3, FREE: T3, Free: 2.6 (ref 2.3–4.2)

## 2011-02-03 LAB — MAGNESIUM: Magnesium: 2.1

## 2011-02-03 LAB — TSH: TSH: 0.236 — ABNORMAL LOW

## 2011-02-03 LAB — URIC ACID: Uric Acid, Serum: 3.5

## 2011-02-03 LAB — OSMOLALITY, URINE: Osmolality, Ur: 168 — ABNORMAL LOW

## 2011-02-03 LAB — OSMOLALITY: Osmolality: 264 — ABNORMAL LOW

## 2011-02-07 NOTE — H&P (Signed)
NAMEGARGI, GALLARZO              ACCOUNT NO.:  [id number]  MEDICAL RECORD NO.:  [id number]  LOCATION:  [facility]                         FACILITY:  [place]  PHYSICIAN:  Jazzlynn Rawe I Lindee Leason, MD      DATE OF BIRTH:  1932-08-12  DATE OF ADMISSION:  12/22/2010 DATE OF DISCHARGE:                             HISTORY & PHYSICAL   PRIMARY CARE PHYSICIAN:  Daiva Eves, MD  CHIEF COMPLAINT:  Dizziness for 1 day.  HISTORY OF PRESENT ILLNESS:  This is a 77 year old female resident so far nursing home with history of deafness, hypertension, chronic renal insufficiency with a baseline creatinine around 1.2, hypercholesterolemia, and type II diabetes mellitus, not on inulin, presented to the ED with chief complaint of dizziness, started this morning.  History mainly obtained through sign interpreter as per patient.  She denies any numbness or weakness on her upper or lower extremities and denies any facial droop.  Her dizziness better. We could not identify if the patient felt spinning around the room or the room is spinning and as the patient is deaf, patient cannot feel roaring in her ear.  The patient denies any urine incontinence or bowel incontinence. The patient uses a walker to walk and dizziness get worse, when she stands up and relieved with sitting down.  The patient denies her dizziness related to certain position.  On the emergency room, a CT scan of her head done, which was negative and currently, the patient denies any feeling dizzy.  PAST MEDICAL HISTORY: 1. Dementia. 2. Dizziness. 3. Alcohol abuse. 4. Hypercholesterolemia. 5. Hypertension.  MEDICATIONS: 1. Coreg 25 mg twice daily. 2. Clonidine 0.3 mg twice daily. 3. Donepezil 10 mg at bedtime. 4. Celexa 10 mg daily. 5. Amaryl 4 mg twice daily. 6. Losartan 100 mg daily. 7. Simvastatin 40 mg at bedtime. 8. Nystatin 2000 mg at bedtime. 9. Olanzapine 2.5 mg. 10.Risperdal. 11.Trazodone. 12.Triplex.  ALLERGIES:  No known  allergies.  FAMILY HISTORY:  Unobtainable.  SOCIAL HISTORY:  The patient lives in nursing home.  She walks with a walker.  She is deaf and uses fine language pretty fine.  REVIEW OF SYSTEMS:  On evaluation of systemic review, the patient denies any headache.  Denies any facial droop.  Denies any numbness or weakness of her extremities.  Denies any neck pain or back pain.  HEART:  Denies any chest pain or palpitation.  Denies any orthopnea or paroxysmal nocturnal dyspnea.  Denies any nausea, vomiting, or abdominal pain.  Has regular bowel movement.  Denies any burning micturition.  The patient complained of left knee pain, but no left hip pain and limitation of movements secondary to pain.  PHYSICAL EXAMINATION:  VITAL SIGNS:  Temperature 97, blood pressure 135/77, pulse 66, respiratory rate of 14, and saturating 99% on room air. GENERAL:  The patient is lying comfortably, not in respiratory distress or shortness of breath.  Awake and alert.  Able to follow command with sign interpreter. NECK:  Cannot access for JVP secondary to fullness and short.  No lymphadenopathy. BREASTS:  No masses. LUNGS:  Normal vesicular breathing with equal air entry. HEART:  S1 and S2.  No added sounds.  No murmurs.  No  gallops. ABDOMEN:  Soft and nontender.  Bowel sounds present.  No rebound.  No guarding. EXTREMITIES:  Without edema.  Peripheral pulses intact. CENTRAL NERVOUS SYSTEM:  The patient is awake and alert.  Cranial nerves seems intact.  No focal facial drooping.  Lower and upper extremity power 5/5.  Cerebellar sign negative including no dysdiadochocinesia and heel-to-shin fine negative.  ASSESSMENT AND PLAN: 1. This is a 77 year old female deaf, history of hypertension,     diabetes mellitus, on oral hypoglycemic agent, presents with     dizziness, likely related to positional vertigo versus inner ear     problem.  Unlikely, related to central vertigo.  I did not see any     neuro  deficit at this time.  We will admit the patient to neuro     tele to exclude any arrhythmia as the cause of the patient's     dizziness.  We will get the patient MRI of the brain and MRA of the     brain and neck to exclude any central vertigo.  We will place the     patient on aspirin.  We will start the patient on meclizine 25 mg     p.o. b.i.d. and we will ask PT and OT to evaluate. 2. Regarding the patient's left knee pain, there is no evidence of     effusion.  Yes, there is limitation of joint movement, which could     be secondary to arthritis.  I will get left hip x-ray and we will     involve physical therapy. Former medications which are home     medications will be resumed.  We will place the patient on tele to     exclude any arrhythmia as we mentioned for the patient's vertigo.     DVT and GI prophylaxis.     Othello Dickenson Franco Collet, MD     HIE/MEDQ  D:  12/22/2010  T:  12/22/2010  Job:  [id number]  cc:   Daiva Eves, MD  Electronically Signed by Donia Ast MD on 02/07/2011 04:31:19 PM

## 2011-02-10 LAB — DIFFERENTIAL
Basophils Absolute: 0
Basophils Relative: 1
Eosinophils Absolute: 0.1
Eosinophils Relative: 2
Lymphocytes Relative: 45
Lymphs Abs: 2.2
Monocytes Absolute: 0.4
Monocytes Relative: 8
Neutro Abs: 2.2
Neutrophils Relative %: 45

## 2011-02-10 LAB — BASIC METABOLIC PANEL
BUN: 6
CO2: 28
Calcium: 10.1
Chloride: 97
Creatinine, Ser: 1
GFR calc Af Amer: >60
GFR calc non Af Amer: 55 — ABNORMAL LOW
Glucose, Bld: 98
Potassium: 3.5
Sodium: 132 — ABNORMAL LOW

## 2011-02-10 LAB — CULTURE, BLOOD (ROUTINE X 2): Culture: NO GROWTH

## 2011-02-10 LAB — CBC
HCT: 38.4
Hemoglobin: 12.8
MCHC: 33.3
MCV: 97.3
Platelets: 375
RBC: 3.94
RDW: 12.6
WBC: 4.9

## 2011-02-21 ENCOUNTER — Other Ambulatory Visit: Payer: Self-pay | Admitting: Family Medicine

## 2011-02-21 ENCOUNTER — Ambulatory Visit
Admission: RE | Admit: 2011-02-21 | Discharge: 2011-02-21 | Disposition: A | Discharge status: Home / Self Care | Payer: Medicare Other | Source: Ambulatory Visit | Attending: Family Medicine | Admitting: Family Medicine

## 2011-02-21 DIAGNOSIS — M25511 Pain in right shoulder: Secondary | ICD-10-CM

## 2011-03-27 ENCOUNTER — Emergency Department (HOSPITAL_COMMUNITY)
Admission: EM | Admit: 2011-03-27 | Discharge: 2011-03-28 | Disposition: A | Discharge status: Skilled Nursing Facility | Payer: Medicare Other | Attending: Emergency Medicine | Admitting: Emergency Medicine

## 2011-03-27 ENCOUNTER — Emergency Department (HOSPITAL_COMMUNITY): Payer: Medicare Other

## 2011-03-27 ENCOUNTER — Encounter: Payer: Self-pay | Admitting: Emergency Medicine

## 2011-03-27 DIAGNOSIS — F0390 Unspecified dementia without behavioral disturbance: Secondary | ICD-10-CM | POA: Insufficient documentation

## 2011-03-27 DIAGNOSIS — R040 Epistaxis: Secondary | ICD-10-CM | POA: Insufficient documentation

## 2011-03-27 DIAGNOSIS — N189 Chronic kidney disease, unspecified: Secondary | ICD-10-CM | POA: Insufficient documentation

## 2011-03-27 DIAGNOSIS — E119 Type 2 diabetes mellitus without complications: Secondary | ICD-10-CM | POA: Insufficient documentation

## 2011-03-27 DIAGNOSIS — I129 Hypertensive chronic kidney disease with stage 1 through stage 4 chronic kidney disease, or unspecified chronic kidney disease: Secondary | ICD-10-CM | POA: Insufficient documentation

## 2011-03-27 DIAGNOSIS — R0602 Shortness of breath: Secondary | ICD-10-CM | POA: Insufficient documentation

## 2011-03-27 DIAGNOSIS — E785 Hyperlipidemia, unspecified: Secondary | ICD-10-CM | POA: Insufficient documentation

## 2011-03-27 HISTORY — DX: Pure hypercholesterolemia, unspecified: E78.00

## 2011-03-27 HISTORY — DX: Unspecified dementia, unspecified severity, without behavioral disturbance, psychotic disturbance, mood disturbance, and anxiety: F03.90

## 2011-03-27 HISTORY — DX: Chronic kidney disease, unspecified: N18.9

## 2011-03-27 HISTORY — DX: Anemia, unspecified: D64.9

## 2011-03-27 HISTORY — DX: Alcohol abuse, uncomplicated: F10.10

## 2011-03-27 HISTORY — DX: Essential (primary) hypertension: I10

## 2011-03-27 NOTE — ED Notes (Signed)
C/o sob and runny nose x 1 week.

## 2011-03-27 NOTE — ED Notes (Signed)
Pt to ED from [facility] by [facility]

## 2011-03-28 ENCOUNTER — Encounter (HOSPITAL_COMMUNITY): Payer: Self-pay

## 2011-03-28 NOTE — ED Provider Notes (Signed)
History     CSN: [id number] Arrival date & time: 03/27/2011  8:52 PM   First MD Initiated Contact with Patient 03/27/11 2253      Chief Complaint  Patient presents with  . Shortness of Breath    (Consider location/radiation/quality/duration/timing/severity/associated sxs/prior treatment) Patient is a 77 y.o. female presenting with nosebleeds. The history is provided by the patient. History Limited By: Legally deaf, interpreter used.  Epistaxis  This is a new problem. The current episode started 1 to 2 hours ago. The problem occurs constantly. The problem has not changed since onset.The problem is associated with an unknown factor. The bleeding has been from the right nare. She has tried applying pressure for the symptoms. The treatment provided mild relief. Her past medical history does not include bleeding disorder, colds, sinus problems, allergies, nose-picking or frequent nosebleeds.   nursing home patient, attributes nosebleed to dry air from using the heater with cold temperatures outside. Started tonight while at rest. No trauma or nose picking. No recent illness, cough cold or congestion. Mild to moderate in severity. No pain or radiation. No known aggravating factors otherwise. Not alleviated by holding pressure. Minimal bleeding on arrival  Past Medical History  Diagnosis Date  . Dementia   . Alcohol abuse   . Hypertension   . Anemia   . CKD (chronic kidney disease)   . Hypercholesteremia   . Diabetes mellitus     History reviewed. No pertinent past surgical history.  No family history on file.  History  Substance Use Topics  . Smoking status: Not on file  . Smokeless tobacco: Not on file  . Alcohol Use: Yes    OB History    Grav Para Term Preterm Abortions TAB SAB Ect Mult Living                  Review of Systems  Constitutional: Negative for fever and chills.  HENT: Positive for nosebleeds. Negative for sore throat, rhinorrhea, sneezing, drooling, mouth  sores, trouble swallowing, neck pain, neck stiffness, dental problem, voice change, postnasal drip and sinus pressure.   Eyes: Negative for pain.  Respiratory: Negative for shortness of breath.   Cardiovascular: Negative for chest pain.  Gastrointestinal: Negative for nausea, vomiting, abdominal pain, diarrhea and blood in stool.  Genitourinary: Negative for dysuria.  Musculoskeletal: Negative for back pain.  Skin: Negative for rash.  Neurological: Negative for headaches.  All other systems reviewed and are negative.    Allergies  Review of patient's allergies indicates no known allergies.  Home Medications   Current Outpatient Rx  Name Route Sig Dispense Refill  . CARVEDILOL 25 MG PO TABS Oral Take 25 mg by mouth 2 (two) times daily with a meal.      . CHOLINE FENOFIBRATE 135 MG PO CPDR Oral Take 135 mg by mouth daily.      Marland Kitchen CLONIDINE HCL 0.3 MG PO TABS Oral Take 0.3 mg by mouth 2 (two) times daily.      . DONEPEZIL HCL 10 MG PO TABS Oral Take 10 mg by mouth at bedtime.      Marland Kitchen DOXAZOSIN MESYLATE 8 MG PO TABS Oral Take 8 mg by mouth at bedtime.      Marland Kitchen ESCITALOPRAM OXALATE 10 MG PO TABS Oral Take 10 mg by mouth daily.      Marland Kitchen GLIMEPIRIDE 4 MG PO TABS Oral Take 4 mg by mouth 2 (two) times daily.      Marland Kitchen LOSARTAN POTASSIUM 100 MG PO TABS  Oral Take 100 mg by mouth daily.      Marland Kitchen LOVASTATIN 40 MG PO TABS Oral Take 40 mg by mouth at bedtime.      Marland Kitchen NIACIN (ANTIHYPERLIPIDEMIC) 1000 MG PO TBCR Oral Take 1,000 mg by mouth at bedtime.      Marland Kitchen OLANZAPINE 2.5 MG PO TABS Oral Take 2.5 mg by mouth at bedtime.      Marland Kitchen OLANZAPINE 5 MG PO TABS Oral Take 5 mg by mouth at bedtime.      Marland Kitchen RISPERIDONE 2 MG PO TABS Oral Take 2 mg by mouth 2 (two) times daily.      . TRAZODONE HCL 50 MG PO TABS Oral Take 50 mg by mouth at bedtime.      . ACETAMINOPHEN 500 MG PO TABS Oral Take 1,000 mg by mouth every 6 (six) hours as needed. For pain       BP 138/63  Pulse 56  Temp(Src) 98.5 F (36.9 C) (Oral)  Resp 16   SpO2 100%  Physical Exam  Constitutional: She is oriented to person, place, and time. She appears well-developed and well-nourished.  HENT:  Head: Normocephalic and atraumatic.       Right anterior nosebleed site visualized with minimal oozing. Oral pharynx clear with no postnasal drip. No nasal deformity  Eyes: Conjunctivae and EOM are normal. Pupils are equal, round, and reactive to light.  Neck: Trachea normal. Neck supple. No thyromegaly present.  Cardiovascular: Normal rate, regular rhythm, S1 normal, S2 normal and normal pulses.     No systolic murmur is present   No diastolic murmur is present  Pulses:      Radial pulses are 2+ on the right side, and 2+ on the left side.  Pulmonary/Chest: Effort normal and breath sounds normal. She has no wheezes. She has no rhonchi. She has no rales. She exhibits no tenderness.  Abdominal: Soft. Normal appearance and bowel sounds are normal. There is no tenderness. There is no CVA tenderness and negative Murphy's sign.  Musculoskeletal:       BLE:s Calves nontender, no cords or erythema, negative Homans sign  Neurological: She is alert and oriented to person, place, and time. She has normal strength. No sensory deficit. GCS eye subscore is 4. GCS verbal subscore is 5. GCS motor subscore is 6.  Skin: Skin is warm and dry. No rash noted. She is not diaphoretic.  Psychiatric: Her speech is normal.       Cooperative and appropriate    ED Course  EPISTAXIS MANAGEMENT Date/Time: 03/28/2011 11:19 PM Performed by: Teressa Lower Authorized by: Teressa Lower Consent: Verbal consent obtained. Risks and benefits: risks, benefits and alternatives were discussed Consent given by: patient Patient understanding: patient states understanding of the procedure being performed Patient consent: the patient's understanding of the procedure matches consent given Procedure consent: procedure consent matches procedure scheduled Patient identity confirmed: arm  band Time out: Immediately prior to procedure a "time out" was called to verify the correct patient, procedure, equipment, support staff and site/side marked as required. Local anesthetic: Topical Neo-Synephrine after nose blowing clots removed. Patient sedated: no Treatment site: right anterior Repair method: silver nitrate Post-procedure assessment: bleeding stopped Treatment complexity: complex Patient tolerance: Patient tolerated the procedure well with no immediate complications. Comments: Serial evaluations and silver nitrate as above. 30 minutes after procedure on recheck, bleeding is stopped and patient feels comfortable to be discharged home   (including critical care time)  Diagnosis: Epistaxis   MDM   Right-sided  epistaxis controlled with silver nitrate as above. Epistaxis precautions and instructions verbalized and understood. ENT referral as needed for any recurrent nosebleeds or persistent symptoms. No anticoagulants or risk factors for epistaxis otherwise        Teressa Lower, MD 03/28/11 [phone]

## 2011-06-01 DIAGNOSIS — M79609 Pain in unspecified limb: Secondary | ICD-10-CM | POA: Diagnosis not present

## 2011-06-01 DIAGNOSIS — B351 Tinea unguium: Secondary | ICD-10-CM | POA: Diagnosis not present

## 2011-07-26 DIAGNOSIS — E119 Type 2 diabetes mellitus without complications: Secondary | ICD-10-CM | POA: Diagnosis not present

## 2011-07-26 DIAGNOSIS — I10 Essential (primary) hypertension: Secondary | ICD-10-CM | POA: Diagnosis not present

## 2011-07-26 DIAGNOSIS — E782 Mixed hyperlipidemia: Secondary | ICD-10-CM | POA: Diagnosis not present

## 2011-07-26 DIAGNOSIS — D649 Anemia, unspecified: Secondary | ICD-10-CM | POA: Diagnosis not present

## 2011-07-27 DIAGNOSIS — D649 Anemia, unspecified: Secondary | ICD-10-CM | POA: Diagnosis not present

## 2011-07-27 DIAGNOSIS — Z79899 Other long term (current) drug therapy: Secondary | ICD-10-CM | POA: Diagnosis not present

## 2011-08-03 DIAGNOSIS — B351 Tinea unguium: Secondary | ICD-10-CM | POA: Diagnosis not present

## 2011-08-03 DIAGNOSIS — M79609 Pain in unspecified limb: Secondary | ICD-10-CM | POA: Diagnosis not present

## 2011-10-05 DIAGNOSIS — B351 Tinea unguium: Secondary | ICD-10-CM | POA: Diagnosis not present

## 2011-10-05 DIAGNOSIS — M79609 Pain in unspecified limb: Secondary | ICD-10-CM | POA: Diagnosis not present

## 2012-03-06 DIAGNOSIS — Z23 Encounter for immunization: Secondary | ICD-10-CM | POA: Diagnosis not present

## 2012-03-13 DIAGNOSIS — M79609 Pain in unspecified limb: Secondary | ICD-10-CM | POA: Diagnosis not present

## 2012-03-13 DIAGNOSIS — B351 Tinea unguium: Secondary | ICD-10-CM | POA: Diagnosis not present

## 2012-04-10 DIAGNOSIS — I10 Essential (primary) hypertension: Secondary | ICD-10-CM | POA: Diagnosis not present

## 2012-04-10 DIAGNOSIS — D509 Iron deficiency anemia, unspecified: Secondary | ICD-10-CM | POA: Diagnosis not present

## 2012-04-10 DIAGNOSIS — E119 Type 2 diabetes mellitus without complications: Secondary | ICD-10-CM | POA: Diagnosis not present

## 2012-04-10 DIAGNOSIS — E782 Mixed hyperlipidemia: Secondary | ICD-10-CM | POA: Diagnosis not present

## 2012-05-10 DIAGNOSIS — E119 Type 2 diabetes mellitus without complications: Secondary | ICD-10-CM | POA: Diagnosis not present

## 2012-05-17 DIAGNOSIS — M79609 Pain in unspecified limb: Secondary | ICD-10-CM | POA: Diagnosis not present

## 2012-05-17 DIAGNOSIS — B351 Tinea unguium: Secondary | ICD-10-CM | POA: Diagnosis not present

## 2012-08-13 DIAGNOSIS — Z6836 Body mass index (BMI) 36.0-36.9, adult: Secondary | ICD-10-CM | POA: Diagnosis not present

## 2012-08-13 DIAGNOSIS — E782 Mixed hyperlipidemia: Secondary | ICD-10-CM | POA: Diagnosis not present

## 2012-08-13 DIAGNOSIS — IMO0001 Reserved for inherently not codable concepts without codable children: Secondary | ICD-10-CM | POA: Diagnosis not present

## 2012-08-13 DIAGNOSIS — N183 Chronic kidney disease, stage 3 unspecified: Secondary | ICD-10-CM | POA: Diagnosis not present

## 2012-08-13 DIAGNOSIS — I10 Essential (primary) hypertension: Secondary | ICD-10-CM | POA: Diagnosis not present

## 2012-08-28 DIAGNOSIS — M79609 Pain in unspecified limb: Secondary | ICD-10-CM | POA: Diagnosis not present

## 2012-08-28 DIAGNOSIS — B351 Tinea unguium: Secondary | ICD-10-CM | POA: Diagnosis not present

## 2012-09-25 DIAGNOSIS — R609 Edema, unspecified: Secondary | ICD-10-CM | POA: Diagnosis not present

## 2012-10-30 DIAGNOSIS — B351 Tinea unguium: Secondary | ICD-10-CM | POA: Diagnosis not present

## 2012-10-30 DIAGNOSIS — M79609 Pain in unspecified limb: Secondary | ICD-10-CM | POA: Diagnosis not present

## 2012-11-22 DIAGNOSIS — H2510 Age-related nuclear cataract, unspecified eye: Secondary | ICD-10-CM | POA: Diagnosis not present

## 2013-01-03 DIAGNOSIS — M79609 Pain in unspecified limb: Secondary | ICD-10-CM | POA: Diagnosis not present

## 2013-01-03 DIAGNOSIS — B351 Tinea unguium: Secondary | ICD-10-CM | POA: Diagnosis not present

## 2013-02-01 DIAGNOSIS — Z23 Encounter for immunization: Secondary | ICD-10-CM | POA: Diagnosis not present

## 2013-02-05 DIAGNOSIS — R6889 Other general symptoms and signs: Secondary | ICD-10-CM | POA: Diagnosis not present

## 2013-02-27 ENCOUNTER — Other Ambulatory Visit: Payer: Self-pay | Admitting: Family Medicine

## 2013-02-27 DIAGNOSIS — I10 Essential (primary) hypertension: Secondary | ICD-10-CM | POA: Diagnosis not present

## 2013-02-27 DIAGNOSIS — N183 Chronic kidney disease, stage 3 unspecified: Secondary | ICD-10-CM | POA: Diagnosis not present

## 2013-02-27 DIAGNOSIS — E2839 Other primary ovarian failure: Secondary | ICD-10-CM

## 2013-02-27 DIAGNOSIS — E782 Mixed hyperlipidemia: Secondary | ICD-10-CM | POA: Diagnosis not present

## 2013-02-27 DIAGNOSIS — IMO0001 Reserved for inherently not codable concepts without codable children: Secondary | ICD-10-CM | POA: Diagnosis not present

## 2013-02-27 DIAGNOSIS — Z1231 Encounter for screening mammogram for malignant neoplasm of breast: Secondary | ICD-10-CM

## 2013-03-06 DIAGNOSIS — B351 Tinea unguium: Secondary | ICD-10-CM | POA: Diagnosis not present

## 2013-03-06 DIAGNOSIS — M79609 Pain in unspecified limb: Secondary | ICD-10-CM | POA: Diagnosis not present

## 2013-04-02 ENCOUNTER — Ambulatory Visit
Admission: RE | Admit: 2013-04-02 | Discharge: 2013-04-02 | Disposition: A | Discharge status: Home / Self Care | Payer: Medicare Other | Source: Ambulatory Visit | Attending: Family Medicine | Admitting: Family Medicine

## 2013-04-02 DIAGNOSIS — M899 Disorder of bone, unspecified: Secondary | ICD-10-CM | POA: Diagnosis not present

## 2013-04-02 DIAGNOSIS — Z1231 Encounter for screening mammogram for malignant neoplasm of breast: Secondary | ICD-10-CM | POA: Diagnosis not present

## 2013-04-02 DIAGNOSIS — E2839 Other primary ovarian failure: Secondary | ICD-10-CM

## 2013-06-06 DIAGNOSIS — M79609 Pain in unspecified limb: Secondary | ICD-10-CM | POA: Diagnosis not present

## 2013-06-06 DIAGNOSIS — B351 Tinea unguium: Secondary | ICD-10-CM | POA: Diagnosis not present

## 2013-07-04 DIAGNOSIS — E782 Mixed hyperlipidemia: Secondary | ICD-10-CM | POA: Diagnosis not present

## 2013-07-04 DIAGNOSIS — I10 Essential (primary) hypertension: Secondary | ICD-10-CM | POA: Diagnosis not present

## 2013-07-04 DIAGNOSIS — IMO0001 Reserved for inherently not codable concepts without codable children: Secondary | ICD-10-CM | POA: Diagnosis not present

## 2013-10-30 DIAGNOSIS — Z9181 History of falling: Secondary | ICD-10-CM | POA: Diagnosis not present

## 2013-10-30 DIAGNOSIS — N183 Chronic kidney disease, stage 3 unspecified: Secondary | ICD-10-CM | POA: Diagnosis not present

## 2013-10-30 DIAGNOSIS — I10 Essential (primary) hypertension: Secondary | ICD-10-CM | POA: Diagnosis not present

## 2013-10-30 DIAGNOSIS — E782 Mixed hyperlipidemia: Secondary | ICD-10-CM | POA: Diagnosis not present

## 2013-10-30 DIAGNOSIS — IMO0001 Reserved for inherently not codable concepts without codable children: Secondary | ICD-10-CM | POA: Diagnosis not present

## 2013-10-30 DIAGNOSIS — Z1331 Encounter for screening for depression: Secondary | ICD-10-CM | POA: Diagnosis not present

## 2013-11-21 DIAGNOSIS — B351 Tinea unguium: Secondary | ICD-10-CM | POA: Diagnosis not present

## 2013-11-21 DIAGNOSIS — M79609 Pain in unspecified limb: Secondary | ICD-10-CM | POA: Diagnosis not present

## 2014-01-14 DIAGNOSIS — E119 Type 2 diabetes mellitus without complications: Secondary | ICD-10-CM | POA: Diagnosis not present

## 2014-01-14 DIAGNOSIS — H2589 Other age-related cataract: Secondary | ICD-10-CM | POA: Diagnosis not present

## 2014-01-27 DIAGNOSIS — Z23 Encounter for immunization: Secondary | ICD-10-CM | POA: Diagnosis not present

## 2014-01-30 DIAGNOSIS — Z23 Encounter for immunization: Secondary | ICD-10-CM | POA: Diagnosis not present

## 2014-02-05 DIAGNOSIS — R6889 Other general symptoms and signs: Secondary | ICD-10-CM | POA: Diagnosis not present

## 2014-03-06 DIAGNOSIS — M79672 Pain in left foot: Secondary | ICD-10-CM | POA: Diagnosis not present

## 2014-03-06 DIAGNOSIS — B351 Tinea unguium: Secondary | ICD-10-CM | POA: Diagnosis not present

## 2014-03-06 DIAGNOSIS — M79675 Pain in left toe(s): Secondary | ICD-10-CM | POA: Diagnosis not present

## 2014-03-19 ENCOUNTER — Other Ambulatory Visit: Payer: Self-pay | Admitting: Family Medicine

## 2014-03-19 DIAGNOSIS — N183 Chronic kidney disease, stage 3 (moderate): Secondary | ICD-10-CM | POA: Diagnosis not present

## 2014-03-19 DIAGNOSIS — E1165 Type 2 diabetes mellitus with hyperglycemia: Secondary | ICD-10-CM | POA: Diagnosis not present

## 2014-03-19 DIAGNOSIS — Z1231 Encounter for screening mammogram for malignant neoplasm of breast: Secondary | ICD-10-CM

## 2014-03-19 DIAGNOSIS — E782 Mixed hyperlipidemia: Secondary | ICD-10-CM | POA: Diagnosis not present

## 2014-03-19 DIAGNOSIS — I10 Essential (primary) hypertension: Secondary | ICD-10-CM | POA: Diagnosis not present

## 2014-04-07 ENCOUNTER — Ambulatory Visit
Admission: RE | Admit: 2014-04-07 | Discharge: 2014-04-07 | Disposition: A | Discharge status: Home / Self Care | Payer: Medicare Other | Source: Ambulatory Visit | Attending: Family Medicine | Admitting: Family Medicine

## 2014-04-07 DIAGNOSIS — Z1231 Encounter for screening mammogram for malignant neoplasm of breast: Secondary | ICD-10-CM | POA: Diagnosis not present

## 2014-04-15 DIAGNOSIS — E119 Type 2 diabetes mellitus without complications: Secondary | ICD-10-CM | POA: Diagnosis not present

## 2014-04-15 DIAGNOSIS — H25013 Cortical age-related cataract, bilateral: Secondary | ICD-10-CM | POA: Diagnosis not present

## 2014-04-15 DIAGNOSIS — H40013 Open angle with borderline findings, low risk, bilateral: Secondary | ICD-10-CM | POA: Diagnosis not present

## 2014-07-01 DIAGNOSIS — M79676 Pain in unspecified toe(s): Secondary | ICD-10-CM | POA: Diagnosis not present

## 2014-07-01 DIAGNOSIS — B351 Tinea unguium: Secondary | ICD-10-CM | POA: Diagnosis not present

## 2014-08-11 DIAGNOSIS — F0390 Unspecified dementia without behavioral disturbance: Secondary | ICD-10-CM | POA: Diagnosis not present

## 2014-08-11 DIAGNOSIS — E1165 Type 2 diabetes mellitus with hyperglycemia: Secondary | ICD-10-CM | POA: Diagnosis not present

## 2014-08-11 DIAGNOSIS — I10 Essential (primary) hypertension: Secondary | ICD-10-CM | POA: Diagnosis not present

## 2014-08-11 DIAGNOSIS — E782 Mixed hyperlipidemia: Secondary | ICD-10-CM | POA: Diagnosis not present

## 2014-09-18 DIAGNOSIS — B351 Tinea unguium: Secondary | ICD-10-CM | POA: Diagnosis not present

## 2014-09-18 DIAGNOSIS — M79676 Pain in unspecified toe(s): Secondary | ICD-10-CM | POA: Diagnosis not present

## 2014-12-23 DIAGNOSIS — Z1389 Encounter for screening for other disorder: Secondary | ICD-10-CM | POA: Diagnosis not present

## 2014-12-23 DIAGNOSIS — E782 Mixed hyperlipidemia: Secondary | ICD-10-CM | POA: Diagnosis not present

## 2014-12-23 DIAGNOSIS — D509 Iron deficiency anemia, unspecified: Secondary | ICD-10-CM | POA: Diagnosis not present

## 2014-12-23 DIAGNOSIS — I10 Essential (primary) hypertension: Secondary | ICD-10-CM | POA: Diagnosis not present

## 2014-12-23 DIAGNOSIS — Z79899 Other long term (current) drug therapy: Secondary | ICD-10-CM | POA: Diagnosis not present

## 2014-12-23 DIAGNOSIS — Z9181 History of falling: Secondary | ICD-10-CM | POA: Diagnosis not present

## 2014-12-23 DIAGNOSIS — N183 Chronic kidney disease, stage 3 (moderate): Secondary | ICD-10-CM | POA: Diagnosis not present

## 2014-12-23 DIAGNOSIS — E1165 Type 2 diabetes mellitus with hyperglycemia: Secondary | ICD-10-CM | POA: Diagnosis not present

## 2014-12-23 DIAGNOSIS — Z23 Encounter for immunization: Secondary | ICD-10-CM | POA: Diagnosis not present

## 2015-01-15 DIAGNOSIS — M79674 Pain in right toe(s): Secondary | ICD-10-CM | POA: Diagnosis not present

## 2015-01-15 DIAGNOSIS — B351 Tinea unguium: Secondary | ICD-10-CM | POA: Diagnosis not present

## 2015-03-13 DIAGNOSIS — Z23 Encounter for immunization: Secondary | ICD-10-CM | POA: Diagnosis not present

## 2015-04-23 DIAGNOSIS — M79672 Pain in left foot: Secondary | ICD-10-CM | POA: Diagnosis not present

## 2015-04-23 DIAGNOSIS — M79675 Pain in left toe(s): Secondary | ICD-10-CM | POA: Diagnosis not present

## 2015-04-23 DIAGNOSIS — B351 Tinea unguium: Secondary | ICD-10-CM | POA: Diagnosis not present

## 2015-05-06 ENCOUNTER — Other Ambulatory Visit: Payer: Self-pay | Admitting: Family Medicine

## 2015-05-06 DIAGNOSIS — Z1231 Encounter for screening mammogram for malignant neoplasm of breast: Secondary | ICD-10-CM | POA: Diagnosis not present

## 2015-05-06 DIAGNOSIS — N183 Chronic kidney disease, stage 3 (moderate): Secondary | ICD-10-CM | POA: Diagnosis not present

## 2015-05-06 DIAGNOSIS — E2839 Other primary ovarian failure: Secondary | ICD-10-CM | POA: Diagnosis not present

## 2015-05-06 DIAGNOSIS — E1165 Type 2 diabetes mellitus with hyperglycemia: Secondary | ICD-10-CM | POA: Diagnosis not present

## 2015-05-06 DIAGNOSIS — I10 Essential (primary) hypertension: Secondary | ICD-10-CM | POA: Diagnosis not present

## 2015-05-06 DIAGNOSIS — F0390 Unspecified dementia without behavioral disturbance: Secondary | ICD-10-CM | POA: Diagnosis not present

## 2015-05-06 DIAGNOSIS — Z6836 Body mass index (BMI) 36.0-36.9, adult: Secondary | ICD-10-CM | POA: Diagnosis not present

## 2015-05-06 DIAGNOSIS — E782 Mixed hyperlipidemia: Secondary | ICD-10-CM | POA: Diagnosis not present

## 2015-05-07 DIAGNOSIS — H25813 Combined forms of age-related cataract, bilateral: Secondary | ICD-10-CM | POA: Diagnosis not present

## 2015-05-07 DIAGNOSIS — H35033 Hypertensive retinopathy, bilateral: Secondary | ICD-10-CM | POA: Diagnosis not present

## 2015-05-07 DIAGNOSIS — H40013 Open angle with borderline findings, low risk, bilateral: Secondary | ICD-10-CM | POA: Diagnosis not present

## 2015-05-07 DIAGNOSIS — E119 Type 2 diabetes mellitus without complications: Secondary | ICD-10-CM | POA: Diagnosis not present

## 2015-06-03 ENCOUNTER — Ambulatory Visit
Admission: RE | Admit: 2015-06-03 | Discharge: 2015-06-03 | Disposition: A | Discharge status: Home / Self Care | Payer: Medicare Other | Source: Ambulatory Visit | Attending: Family Medicine | Admitting: Family Medicine

## 2015-06-03 DIAGNOSIS — Z1231 Encounter for screening mammogram for malignant neoplasm of breast: Secondary | ICD-10-CM

## 2015-06-03 DIAGNOSIS — M85852 Other specified disorders of bone density and structure, left thigh: Secondary | ICD-10-CM | POA: Diagnosis not present

## 2015-06-03 DIAGNOSIS — E2839 Other primary ovarian failure: Secondary | ICD-10-CM

## 2015-08-06 ENCOUNTER — Encounter (HOSPITAL_COMMUNITY): Payer: Self-pay | Admitting: *Deleted

## 2015-08-06 ENCOUNTER — Emergency Department (HOSPITAL_COMMUNITY)
Admission: EM | Admit: 2015-08-06 | Discharge: 2015-08-06 | Disposition: A | Discharge status: Home / Self Care | Payer: Medicare Other | Attending: Emergency Medicine | Admitting: Emergency Medicine

## 2015-08-06 DIAGNOSIS — E119 Type 2 diabetes mellitus without complications: Secondary | ICD-10-CM | POA: Diagnosis not present

## 2015-08-06 DIAGNOSIS — B351 Tinea unguium: Secondary | ICD-10-CM | POA: Diagnosis not present

## 2015-08-06 DIAGNOSIS — F0390 Unspecified dementia without behavioral disturbance: Secondary | ICD-10-CM | POA: Diagnosis not present

## 2015-08-06 DIAGNOSIS — M79674 Pain in right toe(s): Secondary | ICD-10-CM | POA: Diagnosis not present

## 2015-08-06 DIAGNOSIS — R220 Localized swelling, mass and lump, head: Secondary | ICD-10-CM | POA: Diagnosis not present

## 2015-08-06 DIAGNOSIS — N189 Chronic kidney disease, unspecified: Secondary | ICD-10-CM | POA: Insufficient documentation

## 2015-08-06 DIAGNOSIS — I129 Hypertensive chronic kidney disease with stage 1 through stage 4 chronic kidney disease, or unspecified chronic kidney disease: Secondary | ICD-10-CM | POA: Diagnosis not present

## 2015-08-06 NOTE — ED Notes (Signed)
Pt son states he noticed the pt's mouth appeared swollen on the right side when he picked her up from assisted living today. Pt states the pt had a similar episode several months ago when her face was swollen but does not recall the cause. Pt appears to have pain in her right lower jaw. Pt does not have difficulty breathing at this time.

## 2015-08-06 NOTE — ED Notes (Signed)
Pt's son upset with the wait time; he states "This is not an emergency room, this is just a bunch of people sitting around waiting"; RN apologized and advised that we were doing the best we can and will get to a room as soon as we possibly can; Son states "I am taking her home and will try to get her seen later"

## 2015-08-07 ENCOUNTER — Emergency Department (HOSPITAL_COMMUNITY): Payer: Medicare Other

## 2015-08-07 ENCOUNTER — Emergency Department (HOSPITAL_COMMUNITY)
Admission: EM | Admit: 2015-08-07 | Discharge: 2015-08-07 | Disposition: A | Discharge status: Home / Self Care | Payer: Medicare Other | Attending: Emergency Medicine | Admitting: Emergency Medicine

## 2015-08-07 ENCOUNTER — Encounter (HOSPITAL_COMMUNITY): Payer: Self-pay | Admitting: Emergency Medicine

## 2015-08-07 DIAGNOSIS — E119 Type 2 diabetes mellitus without complications: Secondary | ICD-10-CM | POA: Diagnosis not present

## 2015-08-07 DIAGNOSIS — Z7982 Long term (current) use of aspirin: Secondary | ICD-10-CM | POA: Diagnosis not present

## 2015-08-07 DIAGNOSIS — Z79899 Other long term (current) drug therapy: Secondary | ICD-10-CM | POA: Diagnosis not present

## 2015-08-07 DIAGNOSIS — F0390 Unspecified dementia without behavioral disturbance: Secondary | ICD-10-CM | POA: Diagnosis not present

## 2015-08-07 DIAGNOSIS — E7800 Pure hypercholesterolemia, unspecified: Secondary | ICD-10-CM | POA: Diagnosis not present

## 2015-08-07 DIAGNOSIS — R29810 Facial weakness: Secondary | ICD-10-CM | POA: Diagnosis not present

## 2015-08-07 DIAGNOSIS — Z862 Personal history of diseases of the blood and blood-forming organs and certain disorders involving the immune mechanism: Secondary | ICD-10-CM | POA: Insufficient documentation

## 2015-08-07 DIAGNOSIS — N189 Chronic kidney disease, unspecified: Secondary | ICD-10-CM | POA: Insufficient documentation

## 2015-08-07 DIAGNOSIS — Z87891 Personal history of nicotine dependence: Secondary | ICD-10-CM | POA: Diagnosis not present

## 2015-08-07 DIAGNOSIS — I129 Hypertensive chronic kidney disease with stage 1 through stage 4 chronic kidney disease, or unspecified chronic kidney disease: Secondary | ICD-10-CM | POA: Insufficient documentation

## 2015-08-07 DIAGNOSIS — R51 Headache: Secondary | ICD-10-CM | POA: Diagnosis not present

## 2015-08-07 DIAGNOSIS — G510 Bell's palsy: Secondary | ICD-10-CM | POA: Insufficient documentation

## 2015-08-07 DIAGNOSIS — Z7984 Long term (current) use of oral hypoglycemic drugs: Secondary | ICD-10-CM | POA: Insufficient documentation

## 2015-08-07 LAB — DIFFERENTIAL
Basophils Absolute: 0 10*3/uL (ref 0.0–0.1)
Basophils Relative: 0 %
Eosinophils Absolute: 0.1 10*3/uL (ref 0.0–0.7)
Eosinophils Relative: 1 %
Lymphocytes Relative: 32 %
Lymphs Abs: 1.8 10*3/uL (ref 0.7–4.0)
Monocytes Absolute: 0.5 10*3/uL (ref 0.1–1.0)
Monocytes Relative: 8 %
Neutro Abs: 3.3 10*3/uL (ref 1.7–7.7)
Neutrophils Relative %: 59 %

## 2015-08-07 LAB — COMPREHENSIVE METABOLIC PANEL
ALT: 14 U/L (ref 14–54)
AST: 19 U/L (ref 15–41)
Albumin: 4.2 g/dL (ref 3.5–5.0)
Alkaline Phosphatase: 99 U/L (ref 38–126)
Anion gap: 6 (ref 5–15)
BUN: 15 mg/dL (ref 6–20)
CO2: 27 mmol/L (ref 22–32)
Calcium: 9.9 mg/dL (ref 8.9–10.3)
Chloride: 104 mmol/L (ref 101–111)
Creatinine, Ser: 1.15 mg/dL — ABNORMAL HIGH (ref 0.44–1.00)
GFR calc Af Amer: 50 mL/min — ABNORMAL LOW (ref >60)
GFR calc non Af Amer: 43 mL/min — ABNORMAL LOW (ref >60)
Glucose, Bld: 202 mg/dL — ABNORMAL HIGH (ref 65–99)
Potassium: 4.3 mmol/L (ref 3.5–5.1)
Sodium: 137 mmol/L (ref 135–145)
Total Bilirubin: 0.3 mg/dL (ref 0.3–1.2)
Total Protein: 7.1 g/dL (ref 6.5–8.1)

## 2015-08-07 LAB — CBC
HCT: 35.1 % — ABNORMAL LOW (ref 36.0–46.0)
Hemoglobin: 11.8 g/dL — ABNORMAL LOW (ref 12.0–15.0)
MCH: 30.5 pg (ref 26.0–34.0)
MCHC: 33.6 g/dL (ref 30.0–36.0)
MCV: 90.7 fL (ref 78.0–100.0)
Platelets: 298 10*3/uL (ref 150–400)
RBC: 3.87 MIL/uL (ref 3.87–5.11)
RDW: 13.1 % (ref 11.5–15.5)
WBC: 5.6 10*3/uL (ref 4.0–10.5)

## 2015-08-07 LAB — PROTIME-INR
INR: 1.03 (ref 0.00–1.49)
Prothrombin Time: 13.7 seconds (ref 11.6–15.2)

## 2015-08-07 LAB — APTT: aPTT: 33 seconds (ref 24–37)

## 2015-08-07 LAB — I-STAT TROPONIN, ED: Troponin i, poc: 0 ng/mL (ref 0.00–0.08)

## 2015-08-07 MED ORDER — HYPROMELLOSE (GONIOSCOPIC) 2.5 % OP SOLN: 1.0000 [drp] | Freq: Four times a day (QID) | OPHTHALMIC | Status: DC

## 2015-08-07 NOTE — ED Notes (Signed)
Transported POV by caregiver from [facility]. Pt has r/facial droop. Able to open and closes her eyes, with weakness noted on r/eye.. Able to attempt a smile with definite weakness on r/side. No drooling noted. Tongue extended midline. Pt was able to ambulate in the ED. Bilateral grips are equal Per caregiver . Pt was noted as having facial weakness yesterday at 1100. Family was notified. Son transported mother to ED yesterday evening. Family did not stay to complete evaluation and returned patient to her room [facility]. Pt is ac resident in the assisted living facility. Caregiver reported that the patient ate her breakfast, smaller amount than usual. Had no difficulty swallowing. Pt was assisted with dressing, ambulated to bathroom with assist of walker only. The CNA-caregiver stated that there is no obvious change in behavior, change in cognitive function-interaction, no change in gait or balance. Pt is hearing impaired and has a diagnosis of dementia. Per ASL-interpreter. Pt stated that she feels normal body aches, no change in vision-("glasses are working"), she ate some breakfast this morning, then when to the bathroom moved her bowels as usual. Pt responded to most questions appropriately . Pt was unable to recall name of residence. Caregiver stated that her memories are consistant with her normal responses

## 2015-08-07 NOTE — ED Notes (Signed)
Awake. Verbally responsive. A/O x2 (self/place). Resp even and unlabored. No audible adventitious breath sounds noted. ABC's intact.

## 2015-08-07 NOTE — ED Notes (Addendum)
Reported rt facial drooping at 1200 yesterday. Pt is mute, MAEE, and dressing self. Pt denies generalized weakness. Reported unsteady gait. Pt unable to close rt eye.

## 2015-08-07 NOTE — ED Provider Notes (Signed)
CSN: [id number]     Arrival date & time 08/07/15  [id number] History   First MD Initiated Contact with Patient 08/07/15 1134     Chief Complaint  Patient presents with  . Facial Droop    r/sided facial weakness x 24 hours     (Consider location/radiation/quality/duration/timing/severity/associated sxs/prior Treatment) HPI  82 year old female who presents with right sided facial droop. She has a history of dementia, hypertension, CK D, hyperlipidemia, and diabetes. She is staff and nonverbal at baseline, but communicates through sign language. History obtained from patient's caregiver at her nursing facility and via sign language interpreter. Patient noted to have right-sided facial droop at noon yesterday. Her son brought her to the ED for evaluation, but due to the long wait decided take her back and return today for reevaluation. Patient complains of feeling weak and unsteady on her feet today. Denies any recent illnesses including having fever, chills, nausea or vomiting, diarrhea, chest pain, difficulty breathing, cough or shortness of breath. Noted some difficulty with her vision earlier today while reading, but denies any diplopia. No difficulty swallowing or ambulating today according to her caregiver.  Past Medical History  Diagnosis Date  . Dementia   . Alcohol abuse   . Hypertension   . Anemia   . CKD (chronic kidney disease)   . Hypercholesteremia   . Diabetes mellitus    History reviewed. No pertinent past surgical history. No family history on file. Social History  Substance Use Topics  . Smoking status: Former Research scientist (life sciences)  . Smokeless tobacco: None  . Alcohol Use: Yes   OB History    No data available     Review of Systems 10/14 systems reviewed and are negative other than those stated in the HPI    Allergies  Review of patient's allergies indicates no known allergies.  Home Medications   Prior to Admission medications   Medication Sig Start Date End Date Taking?  Authorizing Provider  Acetaminophen (NON-ASPIRIN PO) Take 2 tablets by mouth 3 (three) times daily as needed (leg or back pain).   Yes Historical Provider, MD  aspirin EC 81 MG tablet Take 81 mg by mouth daily.   Yes Historical Provider, MD  cloNIDine (CATAPRES) 0.3 MG tablet Take 0.3 mg by mouth 2 (two) times daily.     Yes Historical Provider, MD  donepezil (ARICEPT) 10 MG tablet Take 10 mg by mouth at bedtime.     Yes Historical Provider, MD  doxazosin (CARDURA) 8 MG tablet Take 16 mg by mouth at bedtime.    Yes Historical Provider, MD  escitalopram (LEXAPRO) 10 MG tablet Take 10 mg by mouth daily.     Yes Historical Provider, MD  glimepiride (AMARYL) 4 MG tablet Take 4 mg by mouth 2 (two) times daily.     Yes Historical Provider, MD  GuaiFENesin (TUSSIN PO) Take 10 mLs by mouth 3 (three) times daily as needed (cough).   Yes Historical Provider, MD  labetalol (NORMODYNE) 300 MG tablet Take 300 mg by mouth 2 (two) times daily.   Yes Historical Provider, MD  losartan (COZAAR) 100 MG tablet Take 100 mg by mouth daily.     Yes Historical Provider, MD  lovastatin (MEVACOR) 40 MG tablet Take 40 mg by mouth at bedtime.     Yes Historical Provider, MD  metFORMIN (GLUCOPHAGE-XR) 750 MG 24 hr tablet Take 750 mg by mouth daily with breakfast.   Yes Historical Provider, MD  OLANZapine (ZYPREXA) 2.5 MG tablet Take 2.5 mg by  mouth every morning.    Yes Historical Provider, MD  OLANZapine (ZYPREXA) 5 MG tablet Take 5 mg by mouth at bedtime.     Yes Historical Provider, MD  sodium chloride (OCEAN) 0.65 % SOLN nasal spray Place 1 spray into both nostrils daily as needed for congestion.   Yes Historical Provider, MD  traZODone (DESYREL) 50 MG tablet Take 50 mg by mouth at bedtime.     Yes Historical Provider, MD  hydroxypropyl methylcellulose / hypromellose (ISOPTO TEARS / GONIOVISC) 2.5 % ophthalmic solution Place 1 drop into the right eye 4 (four) times daily. 08/07/15   Forde Dandy, MD   BP 150/85 mmHg   Pulse 66  Temp(Src) 98.1 F (36.7 C) (Oral)  Resp 18  SpO2 97% Physical Exam Physical Exam  Nursing note and vitals reviewed. Constitutional: Well developed, well nourished, non-toxic, and in no acute distress Head: Normocephalic and atraumatic.  Mouth/Throat: Oropharynx is clear and moist.  Neck: Normal range of motion. Neck supple.  Cardiovascular: Normal rate and regular rhythm.   Pulmonary/Chest: Effort normal and breath sounds normal.  Abdominal: Soft. There is no tenderness. There is no rebound and no guarding.  Musculoskeletal: Normal range of motion.  Skin: Skin is warm and dry.  Psychiatric: Cooperative Neurological:  Alert, oriented to person, place, time, and situation. Memory grossly in tact. Fluent speech. No dysarthria or aphasia.  Cranial nerves: Pupils are symmetric, and reactive to light. EOMI without nystagmus. No gaze deviation. Right sided facial droop with involvement of the forehead. Sensation to light touch over face in tact bilaterally. Hearing grossly in tact. Palate elevates symmetrically. Head turn and shoulder shrug are intact. Tongue midline.  Reflexes defered.  Muscle bulk and tone normal. No pronator drift. Moves all extremities symmetrically. Sensation to light touch is in tact throughout in bilateral upper and lower extremities. Coordination reveals no dysmetria with finger to nose.   ED Course  Procedures (including critical care time) Labs Review Labs Reviewed  CBC - Abnormal; Notable for the following:    Hemoglobin 11.8 (*)    HCT 35.1 (*)    All other components within normal limits  COMPREHENSIVE METABOLIC PANEL - Abnormal; Notable for the following:    Glucose, Bld 202 (*)    Creatinine, Ser 1.15 (*)    GFR calc non Af Amer 43 (*)    GFR calc Af Amer 50 (*)    All other components within normal limits  PROTIME-INR  APTT  DIFFERENTIAL  I-STAT TROPOININ, ED    Imaging Review Ct Head Wo Contrast  08/07/2015  CLINICAL DATA:  Fell  palsy on RIGHT, gait instability, visual changes, RIGHT facial droop and facial pain, unable to close RIGHT eye since yesterday at 1100 hours, hypertension, diabetes mellitus, dementia, chronic kidney disease, ethanol abuse EXAM: CT HEAD WITHOUT CONTRAST TECHNIQUE: Contiguous axial images were obtained from the base of the skull through the vertex without intravenous contrast. COMPARISON:  12/22/2010 FINDINGS: Generalized atrophy. Normal ventricular morphology. No midline shift or mass effect. Small vessel chronic ischemic changes of deep cerebral white matter. No intracranial hemorrhage, mass lesion, or acute infarction. Visualized paranasal sinuses and mastoid air cells clear. Bones unremarkable. IMPRESSION: Atrophy with small vessel chronic ischemic changes of deep cerebral white matter. No acute intracranial abnormalities. Electronically Signed   By: Lavonia Essa Wenk M.D.   On: 08/07/2015 13:08   Mr Brain Wo Contrast  08/07/2015  CLINICAL DATA:  Right facial droop. Right eye weakness. Bell's palsy versus stroke. EXAM: MRI  HEAD WITHOUT CONTRAST TECHNIQUE: Multiplanar, multiecho pulse sequences of the brain and surrounding structures were obtained without intravenous contrast. COMPARISON:  CT head without contrast 08/07/2015. MRI brain 12/22/2010. FINDINGS: The diffusion-weighted images demonstrate no evidence for acute or subacute infarction. No acute hemorrhage or mass lesion is present. Moderate periventricular and subcortical T2 changes bilaterally have progressed since the prior exam. There are remote lacunar infarcts within the basal ganglia bilaterally. The brainstem and cerebellum are normal. The internal auditory canals are within normal limits. Flow is present in the major intracranial arteries. Fetal type posterior cerebral arteries are noted bilaterally. The globes and orbits are intact. The paranasal sinuses and mastoid air cells are clear. Sclerotic lesions of the posterior parietal bone just above the  lambdoid suture are stable. IMPRESSION: 1. No acute or subacute infarct. 2. Progression of moderate periventricular and subcortical T2 changes since 2012. Electronically Signed   By: San Morelle M.D.   On: 08/07/2015 15:21   I have personally reviewed and evaluated these images and lab results as part of my medical decision-making.   MDM   Final diagnoses:  Bell's palsy    82 year old female who presents with right-sided facial droop. On presentation is nontoxic in no acute distress. Vital signs are stable. With right-sided facial droop involving the forehead, and suggestive of a Bell's pulse he. However given that patient has some mild dementia and potential communication issue due to sign language dependence, will require a CVA workup in the setting that she stated that her vision seemed off earlier this morning that is now resolved and she has had gait instability earlier this morning that is now resolved. Her neurological exam shows no dysmetria, and prior to arrival her caregiver says that she had a steady gait. Reports that her vision is at baseline. CT head was negative for any acute intracranial processes. MRI is negative for any stroke or other acute processes. Discussed results with the patient and family via a sign language interpreter. Discussed potential steroids as well as eye care to prevent corneal abrasions and eye injury. Her son expressed concern that in the setting of her diabetes and hypertension, which can be poorly controlled and she has had issues with the delirium in the setting of using steroids in the past that there may not be more benefit to taking this than not. His prescribed eyedrops and eye care described for patient at her facility on her paperwork. Strict return and follow-up instructions are also reviewed. Family and patient expressed understanding of all discharge instructions, and felt comfortable to plan of care.   Forde Dandy, MD 08/07/15 [phone]

## 2015-08-07 NOTE — Discharge Instructions (Signed)
You do not have evidence of stroke today. You will need to keep your eyes covered and taped shut at nighttime to prevent drying of your eyes. During the daytime you'll need to apply artificial tears as directed on the prescription. You should not have any difficulty swallowing, but if you find that you are having difficulty eating, he may need to be on a more liquid or soft or diet.    Bell Palsy Bell palsy is a condition in which the muscles on one side of the face become paralyzed. This often causes one side of the face to droop. It is a common condition and most people recover completely. RISK FACTORS Risk factors for Bell palsy include:  Pregnancy.  Diabetes.  An infection by a virus, such as infections that cause cold sores. CAUSES  Bell palsy is caused by damage to or inflammation of a nerve in your face. It is unclear why this happens, but an infection by a virus may lead to it. Most of the time the reason it happens is unknown. SIGNS AND SYMPTOMS  Symptoms can range from mild to severe and can take place over a number of hours. Symptoms may include:  Being unable to:  Raise one or both eyebrows.  Close one or both eyes.  Feel parts of your face (facial numbness).  Drooping of the eyelid and corner of the mouth.  Weakness in the face.  Paralysis of half your face.  Loss of taste.  Sensitivity to loud noises.  Difficulty chewing.  Tearing up of the affected eye.  Dryness in the affected eye.  Drooling.  Pain behind one ear. DIAGNOSIS  Diagnosis of Bell palsy may include:  A medical history and physical exam.  An MRI.  A CT scan.  Electromyography (EMG). This is a test that checks how your nerves are working. TREATMENT  Treatment may include antiviral medicine to help shorten the length of the condition. Sometimes treatment is not needed and the symptoms go away on their own. HOME CARE INSTRUCTIONS   Take medicines only as directed by your health care  provider.  Do facial massages and exercises as directed by your health care provider.  If your eye is affected:  Use moisturizing eye drops to prevent drying of your eye as directed by your health care provider.  Protect your eye as directed by your health care provider. SEEK MEDICAL CARE IF:  Your symptoms do not get better or get worse.  You are drooling.  Your eye is red, irritated, or hurts. SEEK IMMEDIATE MEDICAL CARE IF:   Another part of your body feels weak or numb.  You have difficulty swallowing.  You have a fever along with symptoms of Bell palsy.  You develop neck pain. MAKE SURE YOU:   Understand these instructions.  Will watch your condition.  Will get help right away if you are not doing well or get worse.   This information is not intended to replace advice given to you by your health care provider. Make sure you discuss any questions you have with your health care provider.   Document Released: 04/11/2005 Document Revised: 12/31/2014 Document Reviewed: 07/19/2013 Elsevier Interactive Patient Education [employer].

## 2015-08-07 NOTE — ED Notes (Signed)
Used [employer] to assist with sign language interpertation.

## 2015-08-07 NOTE — ED Notes (Signed)
Son called sign language interpretation and requested their present to facility.

## 2015-08-19 DIAGNOSIS — Z79899 Other long term (current) drug therapy: Secondary | ICD-10-CM | POA: Diagnosis not present

## 2015-08-19 DIAGNOSIS — Z6834 Body mass index (BMI) 34.0-34.9, adult: Secondary | ICD-10-CM | POA: Diagnosis not present

## 2015-08-19 DIAGNOSIS — G510 Bell's palsy: Secondary | ICD-10-CM | POA: Diagnosis not present

## 2015-08-19 DIAGNOSIS — R220 Localized swelling, mass and lump, head: Secondary | ICD-10-CM | POA: Diagnosis not present

## 2015-09-03 DIAGNOSIS — E782 Mixed hyperlipidemia: Secondary | ICD-10-CM | POA: Diagnosis not present

## 2015-09-03 DIAGNOSIS — E1165 Type 2 diabetes mellitus with hyperglycemia: Secondary | ICD-10-CM | POA: Diagnosis not present

## 2015-09-09 DIAGNOSIS — Z6836 Body mass index (BMI) 36.0-36.9, adult: Secondary | ICD-10-CM | POA: Diagnosis not present

## 2015-09-09 DIAGNOSIS — N183 Chronic kidney disease, stage 3 (moderate): Secondary | ICD-10-CM | POA: Diagnosis not present

## 2015-09-09 DIAGNOSIS — I10 Essential (primary) hypertension: Secondary | ICD-10-CM | POA: Diagnosis not present

## 2015-09-09 DIAGNOSIS — E1165 Type 2 diabetes mellitus with hyperglycemia: Secondary | ICD-10-CM | POA: Diagnosis not present

## 2015-09-09 DIAGNOSIS — E782 Mixed hyperlipidemia: Secondary | ICD-10-CM | POA: Diagnosis not present

## 2015-09-09 DIAGNOSIS — F0390 Unspecified dementia without behavioral disturbance: Secondary | ICD-10-CM | POA: Diagnosis not present

## 2015-09-09 DIAGNOSIS — E669 Obesity, unspecified: Secondary | ICD-10-CM | POA: Diagnosis not present

## 2015-11-10 DIAGNOSIS — B351 Tinea unguium: Secondary | ICD-10-CM | POA: Diagnosis not present

## 2015-11-10 DIAGNOSIS — M79674 Pain in right toe(s): Secondary | ICD-10-CM | POA: Diagnosis not present

## 2015-11-17 DIAGNOSIS — L989 Disorder of the skin and subcutaneous tissue, unspecified: Secondary | ICD-10-CM | POA: Diagnosis not present

## 2015-12-04 ENCOUNTER — Ambulatory Visit (INDEPENDENT_AMBULATORY_CARE_PROVIDER_SITE_OTHER): Payer: Medicare Other | Admitting: Podiatry

## 2015-12-04 ENCOUNTER — Encounter: Payer: Self-pay | Admitting: Podiatry

## 2015-12-04 DIAGNOSIS — L97421 Non-pressure chronic ulcer of left heel and midfoot limited to breakdown of skin: Secondary | ICD-10-CM | POA: Diagnosis not present

## 2015-12-04 DIAGNOSIS — M898X9 Other specified disorders of bone, unspecified site: Secondary | ICD-10-CM

## 2015-12-04 NOTE — Progress Notes (Signed)
This patient presents the office with a open lesion on her left foot, which has stopped draining and being painful for the last few days. She states that she initially developed an open draining painful area on the inside of her left foot. She never sought any treatment or care for this lesion. She presents the office today stating that this skin lesion has been healing the last few days and there has been no drainage noted from the site. No pain is associated with this at this time, this patient is a diabetic. She presents the office today for an evaluation and treatment of this skin lesion  GENERAL APPEARANCE: Alert, conversant. Appropriately groomed. No acute distress.  VASCULAR: Pedal pulses are  palpable at  [facility] and PT bilateral.  Capillary refill time is immediate to all digits,  Normal temperature gradient.   NEUROLOGIC: sensation is normal to 5.07 monofilament at 5/5 sites bilateral.  Light touch is intact bilateral, Muscle strength normal.  MUSCULOSKELETAL: acceptable muscle strength, tone and stability bilateral.  Intrinsic muscluature intact bilateral.  Rectus appearance of foot and digits noted bilateral. Bony prominence noted medial aspect rearfoot complenx.  DERMATOLOGIC: skin color, texture, and turgor are within normal limits.  Left foot reveals healing ulcer medial aspect rearfoot left foot.  No evidence of redness or swelling or drainage.  Ulcer has resolved and is healing.  Diabetic ulcer left foot.    IE.  Dispersion padding applied left foot.    RTC prn.   Gardiner Barefoot DPM

## 2016-01-11 DIAGNOSIS — Z79899 Other long term (current) drug therapy: Secondary | ICD-10-CM | POA: Diagnosis not present

## 2016-01-11 DIAGNOSIS — N182 Chronic kidney disease, stage 2 (mild): Secondary | ICD-10-CM | POA: Diagnosis not present

## 2016-01-11 DIAGNOSIS — E782 Mixed hyperlipidemia: Secondary | ICD-10-CM | POA: Diagnosis not present

## 2016-01-11 DIAGNOSIS — E1165 Type 2 diabetes mellitus with hyperglycemia: Secondary | ICD-10-CM | POA: Diagnosis not present

## 2016-01-13 DIAGNOSIS — E1165 Type 2 diabetes mellitus with hyperglycemia: Secondary | ICD-10-CM | POA: Diagnosis not present

## 2016-01-13 DIAGNOSIS — I10 Essential (primary) hypertension: Secondary | ICD-10-CM | POA: Diagnosis not present

## 2016-01-13 DIAGNOSIS — N182 Chronic kidney disease, stage 2 (mild): Secondary | ICD-10-CM | POA: Diagnosis not present

## 2016-01-13 DIAGNOSIS — Z9181 History of falling: Secondary | ICD-10-CM | POA: Diagnosis not present

## 2016-01-13 DIAGNOSIS — Z1389 Encounter for screening for other disorder: Secondary | ICD-10-CM | POA: Diagnosis not present

## 2016-01-13 DIAGNOSIS — E782 Mixed hyperlipidemia: Secondary | ICD-10-CM | POA: Diagnosis not present

## 2016-01-13 DIAGNOSIS — F0390 Unspecified dementia without behavioral disturbance: Secondary | ICD-10-CM | POA: Diagnosis not present

## 2016-01-15 DIAGNOSIS — H04121 Dry eye syndrome of right lacrimal gland: Secondary | ICD-10-CM | POA: Diagnosis not present

## 2016-01-15 DIAGNOSIS — I10 Essential (primary) hypertension: Secondary | ICD-10-CM | POA: Diagnosis not present

## 2016-01-15 DIAGNOSIS — E119 Type 2 diabetes mellitus without complications: Secondary | ICD-10-CM | POA: Diagnosis not present

## 2016-01-15 DIAGNOSIS — E785 Hyperlipidemia, unspecified: Secondary | ICD-10-CM | POA: Diagnosis not present

## 2016-01-29 DIAGNOSIS — E119 Type 2 diabetes mellitus without complications: Secondary | ICD-10-CM | POA: Diagnosis not present

## 2016-01-29 DIAGNOSIS — I10 Essential (primary) hypertension: Secondary | ICD-10-CM | POA: Diagnosis not present

## 2016-01-29 DIAGNOSIS — E785 Hyperlipidemia, unspecified: Secondary | ICD-10-CM | POA: Diagnosis not present

## 2016-01-29 DIAGNOSIS — E039 Hypothyroidism, unspecified: Secondary | ICD-10-CM | POA: Diagnosis not present

## 2016-02-08 DIAGNOSIS — N183 Chronic kidney disease, stage 3 (moderate): Secondary | ICD-10-CM | POA: Diagnosis not present

## 2016-02-08 DIAGNOSIS — F0390 Unspecified dementia without behavioral disturbance: Secondary | ICD-10-CM | POA: Diagnosis not present

## 2016-02-08 DIAGNOSIS — H903 Sensorineural hearing loss, bilateral: Secondary | ICD-10-CM | POA: Diagnosis not present

## 2016-02-08 DIAGNOSIS — M6281 Muscle weakness (generalized): Secondary | ICD-10-CM | POA: Diagnosis not present

## 2016-02-08 DIAGNOSIS — I129 Hypertensive chronic kidney disease with stage 1 through stage 4 chronic kidney disease, or unspecified chronic kidney disease: Secondary | ICD-10-CM | POA: Diagnosis not present

## 2016-02-08 DIAGNOSIS — E119 Type 2 diabetes mellitus without complications: Secondary | ICD-10-CM | POA: Diagnosis not present

## 2016-02-09 DIAGNOSIS — N183 Chronic kidney disease, stage 3 (moderate): Secondary | ICD-10-CM | POA: Diagnosis not present

## 2016-02-09 DIAGNOSIS — E119 Type 2 diabetes mellitus without complications: Secondary | ICD-10-CM | POA: Diagnosis not present

## 2016-02-09 DIAGNOSIS — H903 Sensorineural hearing loss, bilateral: Secondary | ICD-10-CM | POA: Diagnosis not present

## 2016-02-09 DIAGNOSIS — I129 Hypertensive chronic kidney disease with stage 1 through stage 4 chronic kidney disease, or unspecified chronic kidney disease: Secondary | ICD-10-CM | POA: Diagnosis not present

## 2016-02-09 DIAGNOSIS — F0390 Unspecified dementia without behavioral disturbance: Secondary | ICD-10-CM | POA: Diagnosis not present

## 2016-02-09 DIAGNOSIS — M6281 Muscle weakness (generalized): Secondary | ICD-10-CM | POA: Diagnosis not present

## 2016-02-10 DIAGNOSIS — H903 Sensorineural hearing loss, bilateral: Secondary | ICD-10-CM | POA: Diagnosis not present

## 2016-02-10 DIAGNOSIS — F0390 Unspecified dementia without behavioral disturbance: Secondary | ICD-10-CM | POA: Diagnosis not present

## 2016-02-10 DIAGNOSIS — I129 Hypertensive chronic kidney disease with stage 1 through stage 4 chronic kidney disease, or unspecified chronic kidney disease: Secondary | ICD-10-CM | POA: Diagnosis not present

## 2016-02-10 DIAGNOSIS — M6281 Muscle weakness (generalized): Secondary | ICD-10-CM | POA: Diagnosis not present

## 2016-02-10 DIAGNOSIS — E119 Type 2 diabetes mellitus without complications: Secondary | ICD-10-CM | POA: Diagnosis not present

## 2016-02-10 DIAGNOSIS — N183 Chronic kidney disease, stage 3 (moderate): Secondary | ICD-10-CM | POA: Diagnosis not present

## 2016-02-12 DIAGNOSIS — F0390 Unspecified dementia without behavioral disturbance: Secondary | ICD-10-CM | POA: Diagnosis not present

## 2016-02-15 DIAGNOSIS — F0390 Unspecified dementia without behavioral disturbance: Secondary | ICD-10-CM | POA: Diagnosis not present

## 2016-02-15 DIAGNOSIS — M6281 Muscle weakness (generalized): Secondary | ICD-10-CM | POA: Diagnosis not present

## 2016-02-15 DIAGNOSIS — I129 Hypertensive chronic kidney disease with stage 1 through stage 4 chronic kidney disease, or unspecified chronic kidney disease: Secondary | ICD-10-CM | POA: Diagnosis not present

## 2016-02-15 DIAGNOSIS — N183 Chronic kidney disease, stage 3 (moderate): Secondary | ICD-10-CM | POA: Diagnosis not present

## 2016-02-15 DIAGNOSIS — E119 Type 2 diabetes mellitus without complications: Secondary | ICD-10-CM | POA: Diagnosis not present

## 2016-02-15 DIAGNOSIS — H903 Sensorineural hearing loss, bilateral: Secondary | ICD-10-CM | POA: Diagnosis not present

## 2016-02-17 DIAGNOSIS — E119 Type 2 diabetes mellitus without complications: Secondary | ICD-10-CM | POA: Diagnosis not present

## 2016-02-17 DIAGNOSIS — M6281 Muscle weakness (generalized): Secondary | ICD-10-CM | POA: Diagnosis not present

## 2016-02-17 DIAGNOSIS — N183 Chronic kidney disease, stage 3 (moderate): Secondary | ICD-10-CM | POA: Diagnosis not present

## 2016-02-17 DIAGNOSIS — H903 Sensorineural hearing loss, bilateral: Secondary | ICD-10-CM | POA: Diagnosis not present

## 2016-02-17 DIAGNOSIS — I129 Hypertensive chronic kidney disease with stage 1 through stage 4 chronic kidney disease, or unspecified chronic kidney disease: Secondary | ICD-10-CM | POA: Diagnosis not present

## 2016-02-17 DIAGNOSIS — F0390 Unspecified dementia without behavioral disturbance: Secondary | ICD-10-CM | POA: Diagnosis not present

## 2016-02-19 DIAGNOSIS — H913 Deaf nonspeaking, not elsewhere classified: Secondary | ICD-10-CM | POA: Diagnosis not present

## 2016-02-19 DIAGNOSIS — R269 Unspecified abnormalities of gait and mobility: Secondary | ICD-10-CM | POA: Diagnosis not present

## 2016-02-22 DIAGNOSIS — N183 Chronic kidney disease, stage 3 (moderate): Secondary | ICD-10-CM | POA: Diagnosis not present

## 2016-02-22 DIAGNOSIS — H903 Sensorineural hearing loss, bilateral: Secondary | ICD-10-CM | POA: Diagnosis not present

## 2016-02-22 DIAGNOSIS — E119 Type 2 diabetes mellitus without complications: Secondary | ICD-10-CM | POA: Diagnosis not present

## 2016-02-22 DIAGNOSIS — I129 Hypertensive chronic kidney disease with stage 1 through stage 4 chronic kidney disease, or unspecified chronic kidney disease: Secondary | ICD-10-CM | POA: Diagnosis not present

## 2016-02-22 DIAGNOSIS — M6281 Muscle weakness (generalized): Secondary | ICD-10-CM | POA: Diagnosis not present

## 2016-02-22 DIAGNOSIS — F0390 Unspecified dementia without behavioral disturbance: Secondary | ICD-10-CM | POA: Diagnosis not present

## 2016-02-24 DIAGNOSIS — H903 Sensorineural hearing loss, bilateral: Secondary | ICD-10-CM | POA: Diagnosis not present

## 2016-02-24 DIAGNOSIS — I129 Hypertensive chronic kidney disease with stage 1 through stage 4 chronic kidney disease, or unspecified chronic kidney disease: Secondary | ICD-10-CM | POA: Diagnosis not present

## 2016-02-24 DIAGNOSIS — M6281 Muscle weakness (generalized): Secondary | ICD-10-CM | POA: Diagnosis not present

## 2016-02-24 DIAGNOSIS — F0390 Unspecified dementia without behavioral disturbance: Secondary | ICD-10-CM | POA: Diagnosis not present

## 2016-02-24 DIAGNOSIS — N183 Chronic kidney disease, stage 3 (moderate): Secondary | ICD-10-CM | POA: Diagnosis not present

## 2016-02-24 DIAGNOSIS — E119 Type 2 diabetes mellitus without complications: Secondary | ICD-10-CM | POA: Diagnosis not present

## 2016-02-26 DIAGNOSIS — E119 Type 2 diabetes mellitus without complications: Secondary | ICD-10-CM | POA: Diagnosis not present

## 2016-02-26 DIAGNOSIS — H903 Sensorineural hearing loss, bilateral: Secondary | ICD-10-CM | POA: Diagnosis not present

## 2016-02-26 DIAGNOSIS — N183 Chronic kidney disease, stage 3 (moderate): Secondary | ICD-10-CM | POA: Diagnosis not present

## 2016-02-26 DIAGNOSIS — F0390 Unspecified dementia without behavioral disturbance: Secondary | ICD-10-CM | POA: Diagnosis not present

## 2016-02-26 DIAGNOSIS — M6281 Muscle weakness (generalized): Secondary | ICD-10-CM | POA: Diagnosis not present

## 2016-02-26 DIAGNOSIS — I129 Hypertensive chronic kidney disease with stage 1 through stage 4 chronic kidney disease, or unspecified chronic kidney disease: Secondary | ICD-10-CM | POA: Diagnosis not present

## 2016-02-29 DIAGNOSIS — N183 Chronic kidney disease, stage 3 (moderate): Secondary | ICD-10-CM | POA: Diagnosis not present

## 2016-02-29 DIAGNOSIS — H903 Sensorineural hearing loss, bilateral: Secondary | ICD-10-CM | POA: Diagnosis not present

## 2016-02-29 DIAGNOSIS — I129 Hypertensive chronic kidney disease with stage 1 through stage 4 chronic kidney disease, or unspecified chronic kidney disease: Secondary | ICD-10-CM | POA: Diagnosis not present

## 2016-02-29 DIAGNOSIS — E119 Type 2 diabetes mellitus without complications: Secondary | ICD-10-CM | POA: Diagnosis not present

## 2016-02-29 DIAGNOSIS — M6281 Muscle weakness (generalized): Secondary | ICD-10-CM | POA: Diagnosis not present

## 2016-02-29 DIAGNOSIS — F0390 Unspecified dementia without behavioral disturbance: Secondary | ICD-10-CM | POA: Diagnosis not present

## 2016-03-02 DIAGNOSIS — I129 Hypertensive chronic kidney disease with stage 1 through stage 4 chronic kidney disease, or unspecified chronic kidney disease: Secondary | ICD-10-CM | POA: Diagnosis not present

## 2016-03-02 DIAGNOSIS — H903 Sensorineural hearing loss, bilateral: Secondary | ICD-10-CM | POA: Diagnosis not present

## 2016-03-02 DIAGNOSIS — M6281 Muscle weakness (generalized): Secondary | ICD-10-CM | POA: Diagnosis not present

## 2016-03-02 DIAGNOSIS — N183 Chronic kidney disease, stage 3 (moderate): Secondary | ICD-10-CM | POA: Diagnosis not present

## 2016-03-02 DIAGNOSIS — E119 Type 2 diabetes mellitus without complications: Secondary | ICD-10-CM | POA: Diagnosis not present

## 2016-03-02 DIAGNOSIS — F0390 Unspecified dementia without behavioral disturbance: Secondary | ICD-10-CM | POA: Diagnosis not present

## 2016-03-03 DIAGNOSIS — H04121 Dry eye syndrome of right lacrimal gland: Secondary | ICD-10-CM | POA: Diagnosis not present

## 2016-03-03 DIAGNOSIS — E785 Hyperlipidemia, unspecified: Secondary | ICD-10-CM | POA: Diagnosis not present

## 2016-03-03 DIAGNOSIS — F0390 Unspecified dementia without behavioral disturbance: Secondary | ICD-10-CM | POA: Diagnosis not present

## 2016-03-03 DIAGNOSIS — R269 Unspecified abnormalities of gait and mobility: Secondary | ICD-10-CM | POA: Diagnosis not present

## 2016-03-03 DIAGNOSIS — I10 Essential (primary) hypertension: Secondary | ICD-10-CM | POA: Diagnosis not present

## 2016-03-03 DIAGNOSIS — E119 Type 2 diabetes mellitus without complications: Secondary | ICD-10-CM | POA: Diagnosis not present

## 2016-03-07 DIAGNOSIS — M6281 Muscle weakness (generalized): Secondary | ICD-10-CM | POA: Diagnosis not present

## 2016-03-07 DIAGNOSIS — F0390 Unspecified dementia without behavioral disturbance: Secondary | ICD-10-CM | POA: Diagnosis not present

## 2016-03-07 DIAGNOSIS — H903 Sensorineural hearing loss, bilateral: Secondary | ICD-10-CM | POA: Diagnosis not present

## 2016-03-07 DIAGNOSIS — N183 Chronic kidney disease, stage 3 (moderate): Secondary | ICD-10-CM | POA: Diagnosis not present

## 2016-03-07 DIAGNOSIS — E119 Type 2 diabetes mellitus without complications: Secondary | ICD-10-CM | POA: Diagnosis not present

## 2016-03-07 DIAGNOSIS — I129 Hypertensive chronic kidney disease with stage 1 through stage 4 chronic kidney disease, or unspecified chronic kidney disease: Secondary | ICD-10-CM | POA: Diagnosis not present

## 2016-03-09 DIAGNOSIS — I129 Hypertensive chronic kidney disease with stage 1 through stage 4 chronic kidney disease, or unspecified chronic kidney disease: Secondary | ICD-10-CM | POA: Diagnosis not present

## 2016-03-09 DIAGNOSIS — E119 Type 2 diabetes mellitus without complications: Secondary | ICD-10-CM | POA: Diagnosis not present

## 2016-03-09 DIAGNOSIS — M6281 Muscle weakness (generalized): Secondary | ICD-10-CM | POA: Diagnosis not present

## 2016-03-09 DIAGNOSIS — F0390 Unspecified dementia without behavioral disturbance: Secondary | ICD-10-CM | POA: Diagnosis not present

## 2016-03-09 DIAGNOSIS — N183 Chronic kidney disease, stage 3 (moderate): Secondary | ICD-10-CM | POA: Diagnosis not present

## 2016-03-09 DIAGNOSIS — H903 Sensorineural hearing loss, bilateral: Secondary | ICD-10-CM | POA: Diagnosis not present

## 2016-03-11 DIAGNOSIS — Z23 Encounter for immunization: Secondary | ICD-10-CM | POA: Diagnosis not present

## 2016-03-11 DIAGNOSIS — F0390 Unspecified dementia without behavioral disturbance: Secondary | ICD-10-CM | POA: Diagnosis not present

## 2016-03-31 DIAGNOSIS — F0390 Unspecified dementia without behavioral disturbance: Secondary | ICD-10-CM | POA: Diagnosis not present

## 2016-03-31 DIAGNOSIS — R269 Unspecified abnormalities of gait and mobility: Secondary | ICD-10-CM | POA: Diagnosis not present

## 2016-03-31 DIAGNOSIS — I10 Essential (primary) hypertension: Secondary | ICD-10-CM | POA: Diagnosis not present

## 2016-03-31 DIAGNOSIS — E785 Hyperlipidemia, unspecified: Secondary | ICD-10-CM | POA: Diagnosis not present

## 2016-03-31 DIAGNOSIS — H04121 Dry eye syndrome of right lacrimal gland: Secondary | ICD-10-CM | POA: Diagnosis not present

## 2016-03-31 DIAGNOSIS — E119 Type 2 diabetes mellitus without complications: Secondary | ICD-10-CM | POA: Diagnosis not present

## 2016-04-01 DIAGNOSIS — I10 Essential (primary) hypertension: Secondary | ICD-10-CM | POA: Diagnosis not present

## 2016-04-01 DIAGNOSIS — E119 Type 2 diabetes mellitus without complications: Secondary | ICD-10-CM | POA: Diagnosis not present

## 2016-04-01 DIAGNOSIS — E039 Hypothyroidism, unspecified: Secondary | ICD-10-CM | POA: Diagnosis not present

## 2016-04-07 DIAGNOSIS — I10 Essential (primary) hypertension: Secondary | ICD-10-CM | POA: Diagnosis not present

## 2016-04-12 DIAGNOSIS — E119 Type 2 diabetes mellitus without complications: Secondary | ICD-10-CM | POA: Diagnosis not present

## 2016-04-12 DIAGNOSIS — I10 Essential (primary) hypertension: Secondary | ICD-10-CM | POA: Diagnosis not present

## 2016-04-12 DIAGNOSIS — H04121 Dry eye syndrome of right lacrimal gland: Secondary | ICD-10-CM | POA: Diagnosis not present

## 2016-04-12 DIAGNOSIS — F0390 Unspecified dementia without behavioral disturbance: Secondary | ICD-10-CM | POA: Diagnosis not present

## 2016-04-12 DIAGNOSIS — R269 Unspecified abnormalities of gait and mobility: Secondary | ICD-10-CM | POA: Diagnosis not present

## 2016-04-12 DIAGNOSIS — E785 Hyperlipidemia, unspecified: Secondary | ICD-10-CM | POA: Diagnosis not present

## 2016-04-28 DIAGNOSIS — Z0000 Encounter for general adult medical examination without abnormal findings: Secondary | ICD-10-CM | POA: Diagnosis not present

## 2016-05-04 DIAGNOSIS — L603 Nail dystrophy: Secondary | ICD-10-CM | POA: Diagnosis not present

## 2016-05-04 DIAGNOSIS — B351 Tinea unguium: Secondary | ICD-10-CM | POA: Diagnosis not present

## 2016-05-04 DIAGNOSIS — L853 Xerosis cutis: Secondary | ICD-10-CM | POA: Diagnosis not present

## 2016-05-04 DIAGNOSIS — M2010 Hallux valgus (acquired), unspecified foot: Secondary | ICD-10-CM | POA: Diagnosis not present

## 2016-05-04 DIAGNOSIS — Q845 Enlarged and hypertrophic nails: Secondary | ICD-10-CM | POA: Diagnosis not present

## 2016-05-04 DIAGNOSIS — E119 Type 2 diabetes mellitus without complications: Secondary | ICD-10-CM | POA: Diagnosis not present

## 2016-05-18 DIAGNOSIS — E11319 Type 2 diabetes mellitus with unspecified diabetic retinopathy without macular edema: Secondary | ICD-10-CM | POA: Diagnosis not present

## 2016-05-18 DIAGNOSIS — E1100 Type 2 diabetes mellitus with hyperosmolarity without nonketotic hyperglycemic-hyperosmolar coma (NKHHC): Secondary | ICD-10-CM | POA: Diagnosis not present

## 2016-05-18 DIAGNOSIS — H35033 Hypertensive retinopathy, bilateral: Secondary | ICD-10-CM | POA: Diagnosis not present

## 2016-05-18 DIAGNOSIS — H353131 Nonexudative age-related macular degeneration, bilateral, early dry stage: Secondary | ICD-10-CM | POA: Diagnosis not present

## 2016-05-18 DIAGNOSIS — H04123 Dry eye syndrome of bilateral lacrimal glands: Secondary | ICD-10-CM | POA: Diagnosis not present

## 2016-05-18 DIAGNOSIS — Z794 Long term (current) use of insulin: Secondary | ICD-10-CM | POA: Diagnosis not present

## 2016-05-20 DIAGNOSIS — N182 Chronic kidney disease, stage 2 (mild): Secondary | ICD-10-CM | POA: Diagnosis not present

## 2016-05-20 DIAGNOSIS — E782 Mixed hyperlipidemia: Secondary | ICD-10-CM | POA: Diagnosis not present

## 2016-05-20 DIAGNOSIS — E1165 Type 2 diabetes mellitus with hyperglycemia: Secondary | ICD-10-CM | POA: Diagnosis not present

## 2016-05-23 DIAGNOSIS — E1122 Type 2 diabetes mellitus with diabetic chronic kidney disease: Secondary | ICD-10-CM | POA: Diagnosis not present

## 2016-05-23 DIAGNOSIS — I1310 Hypertensive heart and chronic kidney disease without heart failure, with stage 1 through stage 4 chronic kidney disease, or unspecified chronic kidney disease: Secondary | ICD-10-CM | POA: Diagnosis not present

## 2016-05-23 DIAGNOSIS — R41841 Cognitive communication deficit: Secondary | ICD-10-CM | POA: Diagnosis not present

## 2016-05-23 DIAGNOSIS — F0390 Unspecified dementia without behavioral disturbance: Secondary | ICD-10-CM | POA: Diagnosis not present

## 2016-05-23 DIAGNOSIS — H9190 Unspecified hearing loss, unspecified ear: Secondary | ICD-10-CM | POA: Diagnosis not present

## 2016-05-23 DIAGNOSIS — N183 Chronic kidney disease, stage 3 (moderate): Secondary | ICD-10-CM | POA: Diagnosis not present

## 2016-05-24 ENCOUNTER — Emergency Department (HOSPITAL_COMMUNITY): Payer: Medicare Other

## 2016-05-24 ENCOUNTER — Encounter (HOSPITAL_COMMUNITY): Payer: Self-pay

## 2016-05-24 ENCOUNTER — Emergency Department (HOSPITAL_COMMUNITY)
Admission: EM | Admit: 2016-05-24 | Discharge: 2016-05-24 | Disposition: A | Discharge status: Home / Self Care | Payer: Medicare Other | Attending: Emergency Medicine | Admitting: Emergency Medicine

## 2016-05-24 DIAGNOSIS — Z79899 Other long term (current) drug therapy: Secondary | ICD-10-CM | POA: Diagnosis not present

## 2016-05-24 DIAGNOSIS — Z87891 Personal history of nicotine dependence: Secondary | ICD-10-CM | POA: Insufficient documentation

## 2016-05-24 DIAGNOSIS — R4182 Altered mental status, unspecified: Secondary | ICD-10-CM | POA: Diagnosis not present

## 2016-05-24 DIAGNOSIS — Z7984 Long term (current) use of oral hypoglycemic drugs: Secondary | ICD-10-CM | POA: Insufficient documentation

## 2016-05-24 DIAGNOSIS — Z7982 Long term (current) use of aspirin: Secondary | ICD-10-CM | POA: Diagnosis not present

## 2016-05-24 DIAGNOSIS — N189 Chronic kidney disease, unspecified: Secondary | ICD-10-CM | POA: Diagnosis not present

## 2016-05-24 DIAGNOSIS — R791 Abnormal coagulation profile: Secondary | ICD-10-CM | POA: Diagnosis not present

## 2016-05-24 DIAGNOSIS — I129 Hypertensive chronic kidney disease with stage 1 through stage 4 chronic kidney disease, or unspecified chronic kidney disease: Secondary | ICD-10-CM | POA: Insufficient documentation

## 2016-05-24 DIAGNOSIS — R5383 Other fatigue: Secondary | ICD-10-CM

## 2016-05-24 DIAGNOSIS — R531 Weakness: Secondary | ICD-10-CM | POA: Diagnosis not present

## 2016-05-24 DIAGNOSIS — R404 Transient alteration of awareness: Secondary | ICD-10-CM | POA: Diagnosis not present

## 2016-05-24 DIAGNOSIS — E1122 Type 2 diabetes mellitus with diabetic chronic kidney disease: Secondary | ICD-10-CM | POA: Insufficient documentation

## 2016-05-24 LAB — CBC
HCT: 36.1 % (ref 36.0–46.0)
Hemoglobin: 12 g/dL (ref 12.0–15.0)
MCH: 30.8 pg (ref 26.0–34.0)
MCHC: 33.2 g/dL (ref 30.0–36.0)
MCV: 92.8 fL (ref 78.0–100.0)
Platelets: 246 10*3/uL (ref 150–400)
RBC: 3.89 MIL/uL (ref 3.87–5.11)
RDW: 13.1 % (ref 11.5–15.5)
WBC: 5.1 10*3/uL (ref 4.0–10.5)

## 2016-05-24 LAB — URINALYSIS, ROUTINE W REFLEX MICROSCOPIC
Bilirubin Urine: NEGATIVE
Glucose, UA: NEGATIVE mg/dL
Hgb urine dipstick: NEGATIVE
Ketones, ur: NEGATIVE mg/dL
Leukocytes, UA: NEGATIVE
Nitrite: NEGATIVE
Protein, ur: 100 mg/dL — AB
Specific Gravity, Urine: 1.015 (ref 1.005–1.030)
pH: 6 (ref 5.0–8.0)

## 2016-05-24 LAB — COMPREHENSIVE METABOLIC PANEL
ALT: 8 U/L — ABNORMAL LOW (ref 14–54)
AST: 18 U/L (ref 15–41)
Albumin: 3.8 g/dL (ref 3.5–5.0)
Alkaline Phosphatase: 93 U/L (ref 38–126)
Anion gap: 10 (ref 5–15)
BUN: 9 mg/dL (ref 6–20)
CO2: 25 mmol/L (ref 22–32)
Calcium: 10 mg/dL (ref 8.9–10.3)
Chloride: 105 mmol/L (ref 101–111)
Creatinine, Ser: 1.24 mg/dL — ABNORMAL HIGH (ref 0.44–1.00)
GFR calc Af Amer: 46 mL/min — ABNORMAL LOW (ref >60)
GFR calc non Af Amer: 40 mL/min — ABNORMAL LOW (ref >60)
Glucose, Bld: 161 mg/dL — ABNORMAL HIGH (ref 65–99)
Potassium: 3.9 mmol/L (ref 3.5–5.1)
Sodium: 140 mmol/L (ref 135–145)
Total Bilirubin: 0.5 mg/dL (ref 0.3–1.2)
Total Protein: 6.4 g/dL — ABNORMAL LOW (ref 6.5–8.1)

## 2016-05-24 LAB — APTT: aPTT: 31 seconds (ref 24–36)

## 2016-05-24 LAB — DIFFERENTIAL
Basophils Absolute: 0 10*3/uL (ref 0.0–0.1)
Basophils Relative: 0 %
Eosinophils Absolute: 0.1 10*3/uL (ref 0.0–0.7)
Eosinophils Relative: 1 %
Lymphocytes Relative: 36 %
Lymphs Abs: 1.8 10*3/uL (ref 0.7–4.0)
Monocytes Absolute: 0.3 10*3/uL (ref 0.1–1.0)
Monocytes Relative: 6 %
Neutro Abs: 2.8 10*3/uL (ref 1.7–7.7)
Neutrophils Relative %: 57 %

## 2016-05-24 LAB — RAPID URINE DRUG SCREEN, HOSP PERFORMED
Amphetamines: NOT DETECTED
Barbiturates: NOT DETECTED
Benzodiazepines: NOT DETECTED
Cocaine: NOT DETECTED
Opiates: NOT DETECTED
Tetrahydrocannabinol: NOT DETECTED

## 2016-05-24 LAB — I-STAT TROPONIN, ED: Troponin i, poc: 0 ng/mL (ref 0.00–0.08)

## 2016-05-24 LAB — PROTIME-INR
INR: 1.11
Prothrombin Time: 14.4 seconds (ref 11.4–15.2)

## 2016-05-24 LAB — ETHANOL: Alcohol, Ethyl (B): <5 mg/dL (ref <5)

## 2016-05-24 LAB — AMMONIA: Ammonia: 50 umol/L — ABNORMAL HIGH (ref 9–35)

## 2016-05-24 NOTE — ED Notes (Signed)
ED Provider at bedside. 

## 2016-05-24 NOTE — ED Notes (Signed)
Pt returned from RAD

## 2016-05-24 NOTE — ED Provider Notes (Signed)
[facility] DEPT Provider Note   CSN: [id number] Arrival date & time: 05/24/16  1043     History   Chief Complaint Chief Complaint  Patient presents with  . Altered Mental Status    HPI [name] is a 83 y.o. female.  HPI Patient presents to the emergency room for evaluation of somnolence this morning.   The patient is a resident of [facility]. She has history of dementia but is able to do her daily activities on her own. Patient was at the breakfast this table this morning when she fell asleep. Staff at the facility had a difficult time waking her up so they called EMS. EMS noted the same but the patient would wake up and would communicate with them. There were some limitations however because the patient is deaf sign language interpreters. Patient is able to read lips somewhat. In the emergency room the patient states she's having some trouble with feeling feverish. She denies any new weakness, numbness, headache. She denies any pain.  Initially the son was translating as we do not have a interpreter available in the [facility] interpreter services is not functioning properly. Past Medical History:  Diagnosis Date  . Alcohol abuse   . Anemia   . CKD (chronic kidney disease)   . Dementia   . Diabetes mellitus   . Hypercholesteremia   . Hypertension     There are no active problems to display for this patient.   History reviewed. No pertinent surgical history.  OB History    No data available       Home Medications    Prior to Admission medications   Medication Sig Start Date End Date Taking? Authorizing Provider  acetaminophen (TYLENOL) 500 MG tablet Take 1,000 mg by mouth 3 (three) times daily as needed for mild pain.   Yes Historical Provider, MD  aspirin EC 81 MG tablet Take 81 mg by mouth daily.   Yes Historical Provider, MD  carvedilol (COREG) 25 MG tablet Take 25 mg by mouth 2 (two) times daily with a meal.  10/30/15  Yes Historical Provider,  MD  cloNIDine (CATAPRES) 0.3 MG tablet Take 0.3 mg by mouth daily.    Yes Historical Provider, MD  Dextromethorphan-Guaifenesin (ROBITUSSIN SUGAR FREE) 10-100 MG/5ML liquid Take 10 mLs by mouth 3 (three) times daily as needed (cough).   Yes Historical Provider, MD  donepezil (ARICEPT) 10 MG tablet Take 10 mg by mouth at bedtime.     Yes Historical Provider, MD  doxazosin (CARDURA) 8 MG tablet Take 16 mg by mouth at bedtime.    Yes Historical Provider, MD  escitalopram (LEXAPRO) 10 MG tablet Take 10 mg by mouth daily.     Yes Historical Provider, MD  losartan (COZAAR) 100 MG tablet Take 100 mg by mouth daily.     Yes Historical Provider, MD  lovastatin (MEVACOR) 40 MG tablet Take 40 mg by mouth at bedtime.     Yes Historical Provider, MD  Magnesium Sulfate, Laxative, (EPSOM SALT PO) Apply 1 scoop topically daily. For foot soaks   Yes Historical Provider, MD  metFORMIN (GLUCOPHAGE) 1000 MG tablet Take 1,000 mg by mouth 2 (two) times daily with a meal.   Yes Historical Provider, MD  OLANZapine (ZYPREXA) 2.5 MG tablet Take 2.5 mg by mouth 2 (two) times daily.    Yes Historical Provider, MD  Propylene Glycol (SYSTANE BALANCE OP) Apply to eye. Instill 1 drop into the right eye 4 times daily   Yes  Historical Provider, MD  sodium chloride (OCEAN) 0.65 % SOLN nasal spray Place 1 spray into both nostrils daily as needed for congestion (nasal dryness).    Yes Historical Provider, MD  traZODone (DESYREL) 50 MG tablet Take 50 mg by mouth at bedtime.     Yes Historical Provider, MD  hydroxypropyl methylcellulose / hypromellose (ISOPTO TEARS / GONIOVISC) 2.5 % ophthalmic solution Place 1 drop into the right eye 4 (four) times daily. Patient not taking: Reported on 05/24/2016 08/07/15   Forde Dandy, MD    Family History History reviewed. No pertinent family history.  Social History Social History  Substance Use Topics  . Smoking status: Former Research scientist (life sciences)  . Smokeless tobacco: Never Used  . Alcohol use Yes      Allergies   Patient has no known allergies.   Review of Systems Review of Systems  All other systems reviewed and are negative.    Physical Exam Updated Vital Signs BP 131/83   Pulse (!) 54   Temp 97.4 F (36.3 C) (Oral)   Resp 13   Ht 5' 2.5" (1.588 m)   Wt 96.6 kg   SpO2 97%   BMI 38.34 kg/m   Physical Exam  Constitutional: She appears well-developed and well-nourished. No distress.  HENT:  Head: Normocephalic and atraumatic.  Right Ear: External ear normal.  Left Ear: External ear normal.  Eyes: Conjunctivae are normal. Right eye exhibits no discharge. Left eye exhibits no discharge. No scleral icterus.  Neck: Neck supple. No tracheal deviation present.  Cardiovascular: Normal rate, regular rhythm and intact distal pulses.   Pulmonary/Chest: Effort normal and breath sounds normal. No stridor. No respiratory distress. She has no wheezes. She has no rales.  Abdominal: Soft. Bowel sounds are normal. She exhibits no distension. There is no tenderness. There is no rebound and no guarding.  Musculoskeletal: She exhibits no edema or tenderness.  Neurological: She is alert. She has normal strength. A cranial nerve deficit (r facial droop, extraocular movements intact,) is present. No sensory deficit. She exhibits normal muscle tone. She displays no seizure activity. Coordination normal.  Right-sided facial droop, family states the patient has a history of Bell's palsy and this is typical for her  Skin: Skin is warm and dry. No rash noted.  Psychiatric: She has a normal mood and affect.  Nursing note and vitals reviewed.    ED Treatments / Results  Labs (all labs ordered are listed, but only abnormal results are displayed) Labs Reviewed  COMPREHENSIVE METABOLIC PANEL - Abnormal; Notable for the following:       Result Value   Glucose, Bld 161 (*)    Creatinine, Ser 1.24 (*)    Total Protein 6.4 (*)    ALT 8 (*)    GFR calc non Af Amer 40 (*)    GFR calc Af Amer  46 (*)    All other components within normal limits  URINALYSIS, ROUTINE W REFLEX MICROSCOPIC - Abnormal; Notable for the following:    Protein, ur 100 (*)    Bacteria, UA RARE (*)    Squamous Epithelial / LPF 0-5 (*)    All other components within normal limits  AMMONIA - Abnormal; Notable for the following:    Ammonia 50 (*)    All other components within normal limits  CBC  ETHANOL  PROTIME-INR  APTT  DIFFERENTIAL  RAPID URINE DRUG SCREEN, HOSP PERFORMED  I-STAT TROPOININ, ED  CBG MONITORING, ED    EKG  EKG Interpretation  Date/Time:  Tuesday May 24 2016 11:36:04 EST Ventricular Rate:  56 PR Interval:    QRS Duration: 94 QT Interval:  442 QTC Calculation: 427 R Axis:   36 Text Interpretation:  Sinus rhythm No significant change since last tracing Confirmed by Ameena Vesey  MD-J, Veria Stradley ([id number]) on 05/24/2016 1:33:04 PM       Radiology Dg Chest 2 View  Result Date: 05/24/2016 CLINICAL DATA:  Weakness today.  Stroke-like symptoms. EXAM: CHEST  2 VIEW COMPARISON:  05/15/2009 FINDINGS: There is no focal parenchymal opacity. There is no pleural effusion or pneumothorax. There is stable cardiomegaly. 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 There is mild osteoarthritis of the left glenohumeral joint. IMPRESSION: No active cardiopulmonary disease. Electronically Signed   By: Kathreen Devoid   On: 05/24/2016 12:10   Ct Head Wo Contrast  Result Date: 05/24/2016 CLINICAL DATA:  Altered level of consciousness. EXAM: CT HEAD WITHOUT CONTRAST TECHNIQUE: Contiguous axial images were obtained from the base of the skull through the vertex without intravenous contrast. COMPARISON:  Head CT and MRI 08/07/2015 FINDINGS: Brain: There is no evidence of acute cortical infarct, intracranial hemorrhage, mass, midline shift, or extra-axial fluid collection. A chronic lacunar infarct is again seen in the left lentiform nucleus. There is a chronic lacunar infarct in the left frontal  white matter adjacent to the frontal horn of the lateral ventricle which is new. Cerebral white matter hypodensities elsewhere are similar to the prior CT and nonspecific but compatible with moderate chronic small vessel ischemic disease. Vascular: No hyperdense vessel or unexpected calcification. Skull: No fracture or focal osseous lesion. Sinuses/Orbits: Visualized paranasal sinuses and mastoid air cells are clear. Orbits are unremarkable. Other: None. IMPRESSION: 1. No evidence of acute intracranial abnormality. 2. Moderate chronic small vessel ischemic disease. Interval chronic left frontal white matter infarct. Electronically Signed   By: Logan Bores M.D.   On: 05/24/2016 12:00    Procedures Procedures (including critical care time)  Medications Ordered in ED Medications - No data to display   Initial Impression / Assessment and Plan / ED Course  I have reviewed the triage vital signs and the nursing notes.  Pertinent labs & imaging results that were available during my care of the patient were reviewed by me and considered in my medical decision making (see chart for details).   was able to obtain additional history when the sign language interpreter arrived. Patient remembers eating breakfast this morning. She remembers feeling tired. She thinks she may have fallen asleep. Patient denies any complaints right now. She's not having any headaches. No chest pain. No numbness or weakness. The evaluation in the emergency room is reassuring. I doubt stroke TIA or acute infection.  Patient's ammonia level was slightly increased but I doubt that this is clinically significant.  At this point she appears stable to be discharged back to the nursing facility. Patient and her son are comfortable with this plan.  Final Clinical Impressions(s) / ED Diagnoses   Final diagnoses:  Other fatigue    New Prescriptions New Prescriptions   No medications on file     Dorie Rank, MD 05/24/16 1422

## 2016-05-24 NOTE — ED Notes (Signed)
[facility] called and bag lunch given.

## 2016-05-24 NOTE — ED Notes (Signed)
Pt alert and conversing in sign with interpreter.

## 2016-05-24 NOTE — Discharge Instructions (Signed)
Continue your current medications, return to the emergency room as needed

## 2016-05-24 NOTE — ED Notes (Signed)
Patient transported to CT 

## 2016-05-24 NOTE — ED Triage Notes (Addendum)
Per [facility], Pt is coming from [facility]. Pt has dementia at baseline, but is able to do normal ADLs. Pt was eating breakfast this morning when she fell asleep at the table. Pt was not easily aroused and so facility called EMS. EMS noted that patient was hard to arouse. Pt is deaf, but is able to read lips. Alert to self upon arrival. Disoriented otherwise.   Vitals per EMS: 143/89, 50 HR, 12, 97% on RA, 211 CBG

## 2016-05-25 DIAGNOSIS — R41841 Cognitive communication deficit: Secondary | ICD-10-CM | POA: Diagnosis not present

## 2016-05-25 DIAGNOSIS — F0390 Unspecified dementia without behavioral disturbance: Secondary | ICD-10-CM | POA: Diagnosis not present

## 2016-05-25 DIAGNOSIS — E1122 Type 2 diabetes mellitus with diabetic chronic kidney disease: Secondary | ICD-10-CM | POA: Diagnosis not present

## 2016-05-25 DIAGNOSIS — H9190 Unspecified hearing loss, unspecified ear: Secondary | ICD-10-CM | POA: Diagnosis not present

## 2016-05-25 DIAGNOSIS — I1310 Hypertensive heart and chronic kidney disease without heart failure, with stage 1 through stage 4 chronic kidney disease, or unspecified chronic kidney disease: Secondary | ICD-10-CM | POA: Diagnosis not present

## 2016-05-25 DIAGNOSIS — N183 Chronic kidney disease, stage 3 (moderate): Secondary | ICD-10-CM | POA: Diagnosis not present

## 2016-05-26 DIAGNOSIS — E785 Hyperlipidemia, unspecified: Secondary | ICD-10-CM | POA: Diagnosis not present

## 2016-05-26 DIAGNOSIS — I10 Essential (primary) hypertension: Secondary | ICD-10-CM | POA: Diagnosis not present

## 2016-05-26 DIAGNOSIS — F0390 Unspecified dementia without behavioral disturbance: Secondary | ICD-10-CM | POA: Diagnosis not present

## 2016-05-26 DIAGNOSIS — M150 Primary generalized (osteo)arthritis: Secondary | ICD-10-CM | POA: Diagnosis not present

## 2016-05-26 DIAGNOSIS — R269 Unspecified abnormalities of gait and mobility: Secondary | ICD-10-CM | POA: Diagnosis not present

## 2016-05-26 DIAGNOSIS — E119 Type 2 diabetes mellitus without complications: Secondary | ICD-10-CM | POA: Diagnosis not present

## 2016-05-26 DIAGNOSIS — H04121 Dry eye syndrome of right lacrimal gland: Secondary | ICD-10-CM | POA: Diagnosis not present

## 2016-05-31 DIAGNOSIS — I1310 Hypertensive heart and chronic kidney disease without heart failure, with stage 1 through stage 4 chronic kidney disease, or unspecified chronic kidney disease: Secondary | ICD-10-CM | POA: Diagnosis not present

## 2016-05-31 DIAGNOSIS — H9190 Unspecified hearing loss, unspecified ear: Secondary | ICD-10-CM | POA: Diagnosis not present

## 2016-05-31 DIAGNOSIS — H43811 Vitreous degeneration, right eye: Secondary | ICD-10-CM | POA: Diagnosis not present

## 2016-05-31 DIAGNOSIS — E119 Type 2 diabetes mellitus without complications: Secondary | ICD-10-CM | POA: Diagnosis not present

## 2016-05-31 DIAGNOSIS — R41841 Cognitive communication deficit: Secondary | ICD-10-CM | POA: Diagnosis not present

## 2016-05-31 DIAGNOSIS — N183 Chronic kidney disease, stage 3 (moderate): Secondary | ICD-10-CM | POA: Diagnosis not present

## 2016-05-31 DIAGNOSIS — F0390 Unspecified dementia without behavioral disturbance: Secondary | ICD-10-CM | POA: Diagnosis not present

## 2016-05-31 DIAGNOSIS — E1122 Type 2 diabetes mellitus with diabetic chronic kidney disease: Secondary | ICD-10-CM | POA: Diagnosis not present

## 2016-05-31 DIAGNOSIS — H25813 Combined forms of age-related cataract, bilateral: Secondary | ICD-10-CM | POA: Diagnosis not present

## 2016-06-02 DIAGNOSIS — M810 Age-related osteoporosis without current pathological fracture: Secondary | ICD-10-CM | POA: Diagnosis not present

## 2016-06-03 DIAGNOSIS — R41841 Cognitive communication deficit: Secondary | ICD-10-CM | POA: Diagnosis not present

## 2016-06-03 DIAGNOSIS — N183 Chronic kidney disease, stage 3 (moderate): Secondary | ICD-10-CM | POA: Diagnosis not present

## 2016-06-03 DIAGNOSIS — H9190 Unspecified hearing loss, unspecified ear: Secondary | ICD-10-CM | POA: Diagnosis not present

## 2016-06-03 DIAGNOSIS — I1310 Hypertensive heart and chronic kidney disease without heart failure, with stage 1 through stage 4 chronic kidney disease, or unspecified chronic kidney disease: Secondary | ICD-10-CM | POA: Diagnosis not present

## 2016-06-03 DIAGNOSIS — F0390 Unspecified dementia without behavioral disturbance: Secondary | ICD-10-CM | POA: Diagnosis not present

## 2016-06-03 DIAGNOSIS — E1122 Type 2 diabetes mellitus with diabetic chronic kidney disease: Secondary | ICD-10-CM | POA: Diagnosis not present

## 2016-06-10 DIAGNOSIS — F0390 Unspecified dementia without behavioral disturbance: Secondary | ICD-10-CM | POA: Diagnosis not present

## 2016-06-10 DIAGNOSIS — H9190 Unspecified hearing loss, unspecified ear: Secondary | ICD-10-CM | POA: Diagnosis not present

## 2016-06-10 DIAGNOSIS — N183 Chronic kidney disease, stage 3 (moderate): Secondary | ICD-10-CM | POA: Diagnosis not present

## 2016-06-10 DIAGNOSIS — E1122 Type 2 diabetes mellitus with diabetic chronic kidney disease: Secondary | ICD-10-CM | POA: Diagnosis not present

## 2016-06-10 DIAGNOSIS — I1310 Hypertensive heart and chronic kidney disease without heart failure, with stage 1 through stage 4 chronic kidney disease, or unspecified chronic kidney disease: Secondary | ICD-10-CM | POA: Diagnosis not present

## 2016-06-10 DIAGNOSIS — R41841 Cognitive communication deficit: Secondary | ICD-10-CM | POA: Diagnosis not present

## 2016-06-16 DIAGNOSIS — H9190 Unspecified hearing loss, unspecified ear: Secondary | ICD-10-CM | POA: Diagnosis not present

## 2016-06-16 DIAGNOSIS — Z111 Encounter for screening for respiratory tuberculosis: Secondary | ICD-10-CM | POA: Diagnosis not present

## 2016-06-16 DIAGNOSIS — N183 Chronic kidney disease, stage 3 (moderate): Secondary | ICD-10-CM | POA: Diagnosis not present

## 2016-06-16 DIAGNOSIS — I1310 Hypertensive heart and chronic kidney disease without heart failure, with stage 1 through stage 4 chronic kidney disease, or unspecified chronic kidney disease: Secondary | ICD-10-CM | POA: Diagnosis not present

## 2016-06-16 DIAGNOSIS — F0390 Unspecified dementia without behavioral disturbance: Secondary | ICD-10-CM | POA: Diagnosis not present

## 2016-06-16 DIAGNOSIS — E1122 Type 2 diabetes mellitus with diabetic chronic kidney disease: Secondary | ICD-10-CM | POA: Diagnosis not present

## 2016-06-16 DIAGNOSIS — R41841 Cognitive communication deficit: Secondary | ICD-10-CM | POA: Diagnosis not present

## 2016-06-21 DIAGNOSIS — R41841 Cognitive communication deficit: Secondary | ICD-10-CM | POA: Diagnosis not present

## 2016-06-21 DIAGNOSIS — N183 Chronic kidney disease, stage 3 (moderate): Secondary | ICD-10-CM | POA: Diagnosis not present

## 2016-06-21 DIAGNOSIS — I1310 Hypertensive heart and chronic kidney disease without heart failure, with stage 1 through stage 4 chronic kidney disease, or unspecified chronic kidney disease: Secondary | ICD-10-CM | POA: Diagnosis not present

## 2016-06-21 DIAGNOSIS — H9190 Unspecified hearing loss, unspecified ear: Secondary | ICD-10-CM | POA: Diagnosis not present

## 2016-06-21 DIAGNOSIS — E1122 Type 2 diabetes mellitus with diabetic chronic kidney disease: Secondary | ICD-10-CM | POA: Diagnosis not present

## 2016-06-21 DIAGNOSIS — F0390 Unspecified dementia without behavioral disturbance: Secondary | ICD-10-CM | POA: Diagnosis not present

## 2016-06-22 DIAGNOSIS — N183 Chronic kidney disease, stage 3 (moderate): Secondary | ICD-10-CM | POA: Diagnosis not present

## 2016-06-22 DIAGNOSIS — H9190 Unspecified hearing loss, unspecified ear: Secondary | ICD-10-CM | POA: Diagnosis not present

## 2016-06-22 DIAGNOSIS — E1122 Type 2 diabetes mellitus with diabetic chronic kidney disease: Secondary | ICD-10-CM | POA: Diagnosis not present

## 2016-06-22 DIAGNOSIS — R41841 Cognitive communication deficit: Secondary | ICD-10-CM | POA: Diagnosis not present

## 2016-06-22 DIAGNOSIS — I1310 Hypertensive heart and chronic kidney disease without heart failure, with stage 1 through stage 4 chronic kidney disease, or unspecified chronic kidney disease: Secondary | ICD-10-CM | POA: Diagnosis not present

## 2016-06-22 DIAGNOSIS — F0390 Unspecified dementia without behavioral disturbance: Secondary | ICD-10-CM | POA: Diagnosis not present

## 2016-06-28 DIAGNOSIS — F0390 Unspecified dementia without behavioral disturbance: Secondary | ICD-10-CM | POA: Diagnosis not present

## 2016-06-29 DIAGNOSIS — R41841 Cognitive communication deficit: Secondary | ICD-10-CM | POA: Diagnosis not present

## 2016-06-29 DIAGNOSIS — H9190 Unspecified hearing loss, unspecified ear: Secondary | ICD-10-CM | POA: Diagnosis not present

## 2016-06-29 DIAGNOSIS — E1122 Type 2 diabetes mellitus with diabetic chronic kidney disease: Secondary | ICD-10-CM | POA: Diagnosis not present

## 2016-06-29 DIAGNOSIS — I1310 Hypertensive heart and chronic kidney disease without heart failure, with stage 1 through stage 4 chronic kidney disease, or unspecified chronic kidney disease: Secondary | ICD-10-CM | POA: Diagnosis not present

## 2016-06-29 DIAGNOSIS — N183 Chronic kidney disease, stage 3 (moderate): Secondary | ICD-10-CM | POA: Diagnosis not present

## 2016-06-29 DIAGNOSIS — F0390 Unspecified dementia without behavioral disturbance: Secondary | ICD-10-CM | POA: Diagnosis not present

## 2016-07-01 DIAGNOSIS — F0390 Unspecified dementia without behavioral disturbance: Secondary | ICD-10-CM | POA: Diagnosis not present

## 2016-07-01 DIAGNOSIS — N183 Chronic kidney disease, stage 3 (moderate): Secondary | ICD-10-CM | POA: Diagnosis not present

## 2016-07-01 DIAGNOSIS — H9190 Unspecified hearing loss, unspecified ear: Secondary | ICD-10-CM | POA: Diagnosis not present

## 2016-07-01 DIAGNOSIS — I1310 Hypertensive heart and chronic kidney disease without heart failure, with stage 1 through stage 4 chronic kidney disease, or unspecified chronic kidney disease: Secondary | ICD-10-CM | POA: Diagnosis not present

## 2016-07-01 DIAGNOSIS — E1122 Type 2 diabetes mellitus with diabetic chronic kidney disease: Secondary | ICD-10-CM | POA: Diagnosis not present

## 2016-07-01 DIAGNOSIS — R41841 Cognitive communication deficit: Secondary | ICD-10-CM | POA: Diagnosis not present

## 2016-07-02 DIAGNOSIS — H04121 Dry eye syndrome of right lacrimal gland: Secondary | ICD-10-CM | POA: Diagnosis not present

## 2016-07-02 DIAGNOSIS — R269 Unspecified abnormalities of gait and mobility: Secondary | ICD-10-CM | POA: Diagnosis not present

## 2016-07-02 DIAGNOSIS — I10 Essential (primary) hypertension: Secondary | ICD-10-CM | POA: Diagnosis not present

## 2016-07-02 DIAGNOSIS — F0390 Unspecified dementia without behavioral disturbance: Secondary | ICD-10-CM | POA: Diagnosis not present

## 2016-07-02 DIAGNOSIS — E785 Hyperlipidemia, unspecified: Secondary | ICD-10-CM | POA: Diagnosis not present

## 2016-07-02 DIAGNOSIS — E119 Type 2 diabetes mellitus without complications: Secondary | ICD-10-CM | POA: Diagnosis not present

## 2016-07-02 DIAGNOSIS — M150 Primary generalized (osteo)arthritis: Secondary | ICD-10-CM | POA: Diagnosis not present

## 2016-07-12 DIAGNOSIS — H9190 Unspecified hearing loss, unspecified ear: Secondary | ICD-10-CM | POA: Diagnosis not present

## 2016-07-12 DIAGNOSIS — E1122 Type 2 diabetes mellitus with diabetic chronic kidney disease: Secondary | ICD-10-CM | POA: Diagnosis not present

## 2016-07-12 DIAGNOSIS — N183 Chronic kidney disease, stage 3 (moderate): Secondary | ICD-10-CM | POA: Diagnosis not present

## 2016-07-12 DIAGNOSIS — F0390 Unspecified dementia without behavioral disturbance: Secondary | ICD-10-CM | POA: Diagnosis not present

## 2016-07-12 DIAGNOSIS — R41841 Cognitive communication deficit: Secondary | ICD-10-CM | POA: Diagnosis not present

## 2016-07-12 DIAGNOSIS — I1310 Hypertensive heart and chronic kidney disease without heart failure, with stage 1 through stage 4 chronic kidney disease, or unspecified chronic kidney disease: Secondary | ICD-10-CM | POA: Diagnosis not present

## 2016-07-15 DIAGNOSIS — N183 Chronic kidney disease, stage 3 (moderate): Secondary | ICD-10-CM | POA: Diagnosis not present

## 2016-07-15 DIAGNOSIS — E1122 Type 2 diabetes mellitus with diabetic chronic kidney disease: Secondary | ICD-10-CM | POA: Diagnosis not present

## 2016-07-15 DIAGNOSIS — H9190 Unspecified hearing loss, unspecified ear: Secondary | ICD-10-CM | POA: Diagnosis not present

## 2016-07-15 DIAGNOSIS — F0390 Unspecified dementia without behavioral disturbance: Secondary | ICD-10-CM | POA: Diagnosis not present

## 2016-07-15 DIAGNOSIS — I1310 Hypertensive heart and chronic kidney disease without heart failure, with stage 1 through stage 4 chronic kidney disease, or unspecified chronic kidney disease: Secondary | ICD-10-CM | POA: Diagnosis not present

## 2016-07-15 DIAGNOSIS — R41841 Cognitive communication deficit: Secondary | ICD-10-CM | POA: Diagnosis not present

## 2016-07-19 DIAGNOSIS — F0390 Unspecified dementia without behavioral disturbance: Secondary | ICD-10-CM | POA: Diagnosis not present

## 2016-07-19 DIAGNOSIS — H9190 Unspecified hearing loss, unspecified ear: Secondary | ICD-10-CM | POA: Diagnosis not present

## 2016-07-19 DIAGNOSIS — R41841 Cognitive communication deficit: Secondary | ICD-10-CM | POA: Diagnosis not present

## 2016-07-19 DIAGNOSIS — E1122 Type 2 diabetes mellitus with diabetic chronic kidney disease: Secondary | ICD-10-CM | POA: Diagnosis not present

## 2016-07-19 DIAGNOSIS — N183 Chronic kidney disease, stage 3 (moderate): Secondary | ICD-10-CM | POA: Diagnosis not present

## 2016-07-19 DIAGNOSIS — I1310 Hypertensive heart and chronic kidney disease without heart failure, with stage 1 through stage 4 chronic kidney disease, or unspecified chronic kidney disease: Secondary | ICD-10-CM | POA: Diagnosis not present

## 2016-07-21 DIAGNOSIS — N183 Chronic kidney disease, stage 3 (moderate): Secondary | ICD-10-CM | POA: Diagnosis not present

## 2016-07-21 DIAGNOSIS — F0390 Unspecified dementia without behavioral disturbance: Secondary | ICD-10-CM | POA: Diagnosis not present

## 2016-07-21 DIAGNOSIS — R41841 Cognitive communication deficit: Secondary | ICD-10-CM | POA: Diagnosis not present

## 2016-07-21 DIAGNOSIS — H9190 Unspecified hearing loss, unspecified ear: Secondary | ICD-10-CM | POA: Diagnosis not present

## 2016-07-21 DIAGNOSIS — E1122 Type 2 diabetes mellitus with diabetic chronic kidney disease: Secondary | ICD-10-CM | POA: Diagnosis not present

## 2016-07-21 DIAGNOSIS — I1310 Hypertensive heart and chronic kidney disease without heart failure, with stage 1 through stage 4 chronic kidney disease, or unspecified chronic kidney disease: Secondary | ICD-10-CM | POA: Diagnosis not present

## 2016-07-22 DIAGNOSIS — R41841 Cognitive communication deficit: Secondary | ICD-10-CM | POA: Diagnosis not present

## 2016-07-22 DIAGNOSIS — I1310 Hypertensive heart and chronic kidney disease without heart failure, with stage 1 through stage 4 chronic kidney disease, or unspecified chronic kidney disease: Secondary | ICD-10-CM | POA: Diagnosis not present

## 2016-07-22 DIAGNOSIS — H9190 Unspecified hearing loss, unspecified ear: Secondary | ICD-10-CM | POA: Diagnosis not present

## 2016-07-22 DIAGNOSIS — N183 Chronic kidney disease, stage 3 (moderate): Secondary | ICD-10-CM | POA: Diagnosis not present

## 2016-07-22 DIAGNOSIS — F0390 Unspecified dementia without behavioral disturbance: Secondary | ICD-10-CM | POA: Diagnosis not present

## 2016-07-22 DIAGNOSIS — E1122 Type 2 diabetes mellitus with diabetic chronic kidney disease: Secondary | ICD-10-CM | POA: Diagnosis not present

## 2016-07-26 DIAGNOSIS — N183 Chronic kidney disease, stage 3 (moderate): Secondary | ICD-10-CM | POA: Diagnosis not present

## 2016-07-26 DIAGNOSIS — E1122 Type 2 diabetes mellitus with diabetic chronic kidney disease: Secondary | ICD-10-CM | POA: Diagnosis not present

## 2016-07-26 DIAGNOSIS — H9190 Unspecified hearing loss, unspecified ear: Secondary | ICD-10-CM | POA: Diagnosis not present

## 2016-07-26 DIAGNOSIS — I1310 Hypertensive heart and chronic kidney disease without heart failure, with stage 1 through stage 4 chronic kidney disease, or unspecified chronic kidney disease: Secondary | ICD-10-CM | POA: Diagnosis not present

## 2016-07-26 DIAGNOSIS — F0390 Unspecified dementia without behavioral disturbance: Secondary | ICD-10-CM | POA: Diagnosis not present

## 2016-07-26 DIAGNOSIS — R41841 Cognitive communication deficit: Secondary | ICD-10-CM | POA: Diagnosis not present

## 2016-07-28 DIAGNOSIS — H9190 Unspecified hearing loss, unspecified ear: Secondary | ICD-10-CM | POA: Diagnosis not present

## 2016-07-28 DIAGNOSIS — R41841 Cognitive communication deficit: Secondary | ICD-10-CM | POA: Diagnosis not present

## 2016-07-28 DIAGNOSIS — E1122 Type 2 diabetes mellitus with diabetic chronic kidney disease: Secondary | ICD-10-CM | POA: Diagnosis not present

## 2016-07-28 DIAGNOSIS — I1310 Hypertensive heart and chronic kidney disease without heart failure, with stage 1 through stage 4 chronic kidney disease, or unspecified chronic kidney disease: Secondary | ICD-10-CM | POA: Diagnosis not present

## 2016-07-28 DIAGNOSIS — F0390 Unspecified dementia without behavioral disturbance: Secondary | ICD-10-CM | POA: Diagnosis not present

## 2016-07-28 DIAGNOSIS — N183 Chronic kidney disease, stage 3 (moderate): Secondary | ICD-10-CM | POA: Diagnosis not present

## 2016-08-02 DIAGNOSIS — I1310 Hypertensive heart and chronic kidney disease without heart failure, with stage 1 through stage 4 chronic kidney disease, or unspecified chronic kidney disease: Secondary | ICD-10-CM | POA: Diagnosis not present

## 2016-08-02 DIAGNOSIS — E1122 Type 2 diabetes mellitus with diabetic chronic kidney disease: Secondary | ICD-10-CM | POA: Diagnosis not present

## 2016-08-02 DIAGNOSIS — N183 Chronic kidney disease, stage 3 (moderate): Secondary | ICD-10-CM | POA: Diagnosis not present

## 2016-08-02 DIAGNOSIS — H9190 Unspecified hearing loss, unspecified ear: Secondary | ICD-10-CM | POA: Diagnosis not present

## 2016-08-02 DIAGNOSIS — R41841 Cognitive communication deficit: Secondary | ICD-10-CM | POA: Diagnosis not present

## 2016-08-02 DIAGNOSIS — F0390 Unspecified dementia without behavioral disturbance: Secondary | ICD-10-CM | POA: Diagnosis not present

## 2016-08-04 DIAGNOSIS — R41841 Cognitive communication deficit: Secondary | ICD-10-CM | POA: Diagnosis not present

## 2016-08-04 DIAGNOSIS — F0390 Unspecified dementia without behavioral disturbance: Secondary | ICD-10-CM | POA: Diagnosis not present

## 2016-08-04 DIAGNOSIS — N183 Chronic kidney disease, stage 3 (moderate): Secondary | ICD-10-CM | POA: Diagnosis not present

## 2016-08-04 DIAGNOSIS — I1310 Hypertensive heart and chronic kidney disease without heart failure, with stage 1 through stage 4 chronic kidney disease, or unspecified chronic kidney disease: Secondary | ICD-10-CM | POA: Diagnosis not present

## 2016-08-04 DIAGNOSIS — H9190 Unspecified hearing loss, unspecified ear: Secondary | ICD-10-CM | POA: Diagnosis not present

## 2016-08-04 DIAGNOSIS — E1122 Type 2 diabetes mellitus with diabetic chronic kidney disease: Secondary | ICD-10-CM | POA: Diagnosis not present

## 2016-08-08 DIAGNOSIS — R41841 Cognitive communication deficit: Secondary | ICD-10-CM | POA: Diagnosis not present

## 2016-08-08 DIAGNOSIS — N183 Chronic kidney disease, stage 3 (moderate): Secondary | ICD-10-CM | POA: Diagnosis not present

## 2016-08-08 DIAGNOSIS — I1310 Hypertensive heart and chronic kidney disease without heart failure, with stage 1 through stage 4 chronic kidney disease, or unspecified chronic kidney disease: Secondary | ICD-10-CM | POA: Diagnosis not present

## 2016-08-08 DIAGNOSIS — E1122 Type 2 diabetes mellitus with diabetic chronic kidney disease: Secondary | ICD-10-CM | POA: Diagnosis not present

## 2016-08-08 DIAGNOSIS — F0390 Unspecified dementia without behavioral disturbance: Secondary | ICD-10-CM | POA: Diagnosis not present

## 2016-08-08 DIAGNOSIS — H9190 Unspecified hearing loss, unspecified ear: Secondary | ICD-10-CM | POA: Diagnosis not present

## 2016-08-09 DIAGNOSIS — E1122 Type 2 diabetes mellitus with diabetic chronic kidney disease: Secondary | ICD-10-CM | POA: Diagnosis not present

## 2016-08-09 DIAGNOSIS — R41841 Cognitive communication deficit: Secondary | ICD-10-CM | POA: Diagnosis not present

## 2016-08-09 DIAGNOSIS — I1310 Hypertensive heart and chronic kidney disease without heart failure, with stage 1 through stage 4 chronic kidney disease, or unspecified chronic kidney disease: Secondary | ICD-10-CM | POA: Diagnosis not present

## 2016-08-09 DIAGNOSIS — N183 Chronic kidney disease, stage 3 (moderate): Secondary | ICD-10-CM | POA: Diagnosis not present

## 2016-08-09 DIAGNOSIS — H9190 Unspecified hearing loss, unspecified ear: Secondary | ICD-10-CM | POA: Diagnosis not present

## 2016-08-09 DIAGNOSIS — F0390 Unspecified dementia without behavioral disturbance: Secondary | ICD-10-CM | POA: Diagnosis not present

## 2016-08-11 DIAGNOSIS — F0390 Unspecified dementia without behavioral disturbance: Secondary | ICD-10-CM | POA: Diagnosis not present

## 2016-08-11 DIAGNOSIS — N183 Chronic kidney disease, stage 3 (moderate): Secondary | ICD-10-CM | POA: Diagnosis not present

## 2016-08-11 DIAGNOSIS — R41841 Cognitive communication deficit: Secondary | ICD-10-CM | POA: Diagnosis not present

## 2016-08-11 DIAGNOSIS — E1122 Type 2 diabetes mellitus with diabetic chronic kidney disease: Secondary | ICD-10-CM | POA: Diagnosis not present

## 2016-08-11 DIAGNOSIS — H9190 Unspecified hearing loss, unspecified ear: Secondary | ICD-10-CM | POA: Diagnosis not present

## 2016-08-11 DIAGNOSIS — I1310 Hypertensive heart and chronic kidney disease without heart failure, with stage 1 through stage 4 chronic kidney disease, or unspecified chronic kidney disease: Secondary | ICD-10-CM | POA: Diagnosis not present

## 2016-08-16 DIAGNOSIS — I1310 Hypertensive heart and chronic kidney disease without heart failure, with stage 1 through stage 4 chronic kidney disease, or unspecified chronic kidney disease: Secondary | ICD-10-CM | POA: Diagnosis not present

## 2016-08-16 DIAGNOSIS — R41841 Cognitive communication deficit: Secondary | ICD-10-CM | POA: Diagnosis not present

## 2016-08-16 DIAGNOSIS — F0390 Unspecified dementia without behavioral disturbance: Secondary | ICD-10-CM | POA: Diagnosis not present

## 2016-08-16 DIAGNOSIS — H9190 Unspecified hearing loss, unspecified ear: Secondary | ICD-10-CM | POA: Diagnosis not present

## 2016-08-16 DIAGNOSIS — N183 Chronic kidney disease, stage 3 (moderate): Secondary | ICD-10-CM | POA: Diagnosis not present

## 2016-08-16 DIAGNOSIS — E1122 Type 2 diabetes mellitus with diabetic chronic kidney disease: Secondary | ICD-10-CM | POA: Diagnosis not present

## 2016-08-17 DIAGNOSIS — H9190 Unspecified hearing loss, unspecified ear: Secondary | ICD-10-CM | POA: Diagnosis not present

## 2016-08-17 DIAGNOSIS — R41841 Cognitive communication deficit: Secondary | ICD-10-CM | POA: Diagnosis not present

## 2016-08-17 DIAGNOSIS — N183 Chronic kidney disease, stage 3 (moderate): Secondary | ICD-10-CM | POA: Diagnosis not present

## 2016-08-17 DIAGNOSIS — E1122 Type 2 diabetes mellitus with diabetic chronic kidney disease: Secondary | ICD-10-CM | POA: Diagnosis not present

## 2016-08-17 DIAGNOSIS — I1310 Hypertensive heart and chronic kidney disease without heart failure, with stage 1 through stage 4 chronic kidney disease, or unspecified chronic kidney disease: Secondary | ICD-10-CM | POA: Diagnosis not present

## 2016-08-17 DIAGNOSIS — F0390 Unspecified dementia without behavioral disturbance: Secondary | ICD-10-CM | POA: Diagnosis not present

## 2016-08-18 DIAGNOSIS — I1310 Hypertensive heart and chronic kidney disease without heart failure, with stage 1 through stage 4 chronic kidney disease, or unspecified chronic kidney disease: Secondary | ICD-10-CM | POA: Diagnosis not present

## 2016-08-18 DIAGNOSIS — N183 Chronic kidney disease, stage 3 (moderate): Secondary | ICD-10-CM | POA: Diagnosis not present

## 2016-08-18 DIAGNOSIS — H9190 Unspecified hearing loss, unspecified ear: Secondary | ICD-10-CM | POA: Diagnosis not present

## 2016-08-18 DIAGNOSIS — F0390 Unspecified dementia without behavioral disturbance: Secondary | ICD-10-CM | POA: Diagnosis not present

## 2016-08-18 DIAGNOSIS — E1122 Type 2 diabetes mellitus with diabetic chronic kidney disease: Secondary | ICD-10-CM | POA: Diagnosis not present

## 2016-08-18 DIAGNOSIS — R41841 Cognitive communication deficit: Secondary | ICD-10-CM | POA: Diagnosis not present

## 2016-08-22 DIAGNOSIS — I1310 Hypertensive heart and chronic kidney disease without heart failure, with stage 1 through stage 4 chronic kidney disease, or unspecified chronic kidney disease: Secondary | ICD-10-CM | POA: Diagnosis not present

## 2016-08-22 DIAGNOSIS — E1122 Type 2 diabetes mellitus with diabetic chronic kidney disease: Secondary | ICD-10-CM | POA: Diagnosis not present

## 2016-08-22 DIAGNOSIS — R41841 Cognitive communication deficit: Secondary | ICD-10-CM | POA: Diagnosis not present

## 2016-08-22 DIAGNOSIS — N183 Chronic kidney disease, stage 3 (moderate): Secondary | ICD-10-CM | POA: Diagnosis not present

## 2016-08-22 DIAGNOSIS — F0390 Unspecified dementia without behavioral disturbance: Secondary | ICD-10-CM | POA: Diagnosis not present

## 2016-08-22 DIAGNOSIS — H9190 Unspecified hearing loss, unspecified ear: Secondary | ICD-10-CM | POA: Diagnosis not present

## 2016-08-23 DIAGNOSIS — N183 Chronic kidney disease, stage 3 (moderate): Secondary | ICD-10-CM | POA: Diagnosis not present

## 2016-08-23 DIAGNOSIS — R41841 Cognitive communication deficit: Secondary | ICD-10-CM | POA: Diagnosis not present

## 2016-08-23 DIAGNOSIS — I1310 Hypertensive heart and chronic kidney disease without heart failure, with stage 1 through stage 4 chronic kidney disease, or unspecified chronic kidney disease: Secondary | ICD-10-CM | POA: Diagnosis not present

## 2016-08-23 DIAGNOSIS — H9190 Unspecified hearing loss, unspecified ear: Secondary | ICD-10-CM | POA: Diagnosis not present

## 2016-08-23 DIAGNOSIS — F0390 Unspecified dementia without behavioral disturbance: Secondary | ICD-10-CM | POA: Diagnosis not present

## 2016-08-23 DIAGNOSIS — E1122 Type 2 diabetes mellitus with diabetic chronic kidney disease: Secondary | ICD-10-CM | POA: Diagnosis not present

## 2016-08-24 DIAGNOSIS — I1310 Hypertensive heart and chronic kidney disease without heart failure, with stage 1 through stage 4 chronic kidney disease, or unspecified chronic kidney disease: Secondary | ICD-10-CM | POA: Diagnosis not present

## 2016-08-24 DIAGNOSIS — H9190 Unspecified hearing loss, unspecified ear: Secondary | ICD-10-CM | POA: Diagnosis not present

## 2016-08-24 DIAGNOSIS — E1122 Type 2 diabetes mellitus with diabetic chronic kidney disease: Secondary | ICD-10-CM | POA: Diagnosis not present

## 2016-08-24 DIAGNOSIS — F0390 Unspecified dementia without behavioral disturbance: Secondary | ICD-10-CM | POA: Diagnosis not present

## 2016-08-24 DIAGNOSIS — R41841 Cognitive communication deficit: Secondary | ICD-10-CM | POA: Diagnosis not present

## 2016-08-24 DIAGNOSIS — N183 Chronic kidney disease, stage 3 (moderate): Secondary | ICD-10-CM | POA: Diagnosis not present

## 2016-08-25 ENCOUNTER — Encounter (HOSPITAL_COMMUNITY): Payer: Self-pay | Admitting: Emergency Medicine

## 2016-08-25 ENCOUNTER — Emergency Department (HOSPITAL_COMMUNITY): Payer: Medicare Other

## 2016-08-25 ENCOUNTER — Emergency Department (HOSPITAL_COMMUNITY)
Admission: EM | Admit: 2016-08-25 | Discharge: 2016-08-25 | Disposition: A | Discharge status: Home / Self Care | Payer: Medicare Other | Attending: Emergency Medicine | Admitting: Emergency Medicine

## 2016-08-25 DIAGNOSIS — N189 Chronic kidney disease, unspecified: Secondary | ICD-10-CM | POA: Diagnosis not present

## 2016-08-25 DIAGNOSIS — Z7982 Long term (current) use of aspirin: Secondary | ICD-10-CM | POA: Insufficient documentation

## 2016-08-25 DIAGNOSIS — Z87891 Personal history of nicotine dependence: Secondary | ICD-10-CM | POA: Insufficient documentation

## 2016-08-25 DIAGNOSIS — R404 Transient alteration of awareness: Secondary | ICD-10-CM | POA: Diagnosis not present

## 2016-08-25 DIAGNOSIS — Z7984 Long term (current) use of oral hypoglycemic drugs: Secondary | ICD-10-CM | POA: Diagnosis not present

## 2016-08-25 DIAGNOSIS — Z79899 Other long term (current) drug therapy: Secondary | ICD-10-CM | POA: Insufficient documentation

## 2016-08-25 DIAGNOSIS — R42 Dizziness and giddiness: Secondary | ICD-10-CM | POA: Diagnosis not present

## 2016-08-25 DIAGNOSIS — H9190 Unspecified hearing loss, unspecified ear: Secondary | ICD-10-CM | POA: Diagnosis not present

## 2016-08-25 DIAGNOSIS — E119 Type 2 diabetes mellitus without complications: Secondary | ICD-10-CM | POA: Diagnosis not present

## 2016-08-25 DIAGNOSIS — R41841 Cognitive communication deficit: Secondary | ICD-10-CM | POA: Diagnosis not present

## 2016-08-25 DIAGNOSIS — I129 Hypertensive chronic kidney disease with stage 1 through stage 4 chronic kidney disease, or unspecified chronic kidney disease: Secondary | ICD-10-CM | POA: Insufficient documentation

## 2016-08-25 DIAGNOSIS — R51 Headache: Secondary | ICD-10-CM | POA: Diagnosis not present

## 2016-08-25 DIAGNOSIS — E860 Dehydration: Secondary | ICD-10-CM | POA: Insufficient documentation

## 2016-08-25 DIAGNOSIS — F0390 Unspecified dementia without behavioral disturbance: Secondary | ICD-10-CM | POA: Diagnosis not present

## 2016-08-25 DIAGNOSIS — N183 Chronic kidney disease, stage 3 (moderate): Secondary | ICD-10-CM | POA: Diagnosis not present

## 2016-08-25 DIAGNOSIS — E1122 Type 2 diabetes mellitus with diabetic chronic kidney disease: Secondary | ICD-10-CM | POA: Diagnosis not present

## 2016-08-25 DIAGNOSIS — I1310 Hypertensive heart and chronic kidney disease without heart failure, with stage 1 through stage 4 chronic kidney disease, or unspecified chronic kidney disease: Secondary | ICD-10-CM | POA: Diagnosis not present

## 2016-08-25 LAB — URINALYSIS, ROUTINE W REFLEX MICROSCOPIC
Bilirubin Urine: NEGATIVE
Glucose, UA: NEGATIVE mg/dL
Hgb urine dipstick: NEGATIVE
Ketones, ur: NEGATIVE mg/dL
Leukocytes, UA: NEGATIVE
Nitrite: NEGATIVE
Protein, ur: 30 mg/dL — AB
Specific Gravity, Urine: 1.021 (ref 1.005–1.030)
pH: 5 (ref 5.0–8.0)

## 2016-08-25 LAB — BASIC METABOLIC PANEL
Anion gap: 7 (ref 5–15)
BUN: 20 mg/dL (ref 6–20)
CO2: 26 mmol/L (ref 22–32)
Calcium: 9.8 mg/dL (ref 8.9–10.3)
Chloride: 103 mmol/L (ref 101–111)
Creatinine, Ser: 1.47 mg/dL — ABNORMAL HIGH (ref 0.44–1.00)
GFR calc Af Amer: 37 mL/min — ABNORMAL LOW (ref >60)
GFR calc non Af Amer: 32 mL/min — ABNORMAL LOW (ref >60)
Glucose, Bld: 125 mg/dL — ABNORMAL HIGH (ref 65–99)
Potassium: 4.7 mmol/L (ref 3.5–5.1)
Sodium: 136 mmol/L (ref 135–145)

## 2016-08-25 LAB — CBC WITH DIFFERENTIAL/PLATELET
Basophils Absolute: 0 10*3/uL (ref 0.0–0.1)
Basophils Relative: 0 %
Eosinophils Absolute: 0.1 10*3/uL (ref 0.0–0.7)
Eosinophils Relative: 1 %
HCT: 32.4 % — ABNORMAL LOW (ref 36.0–46.0)
Hemoglobin: 10.9 g/dL — ABNORMAL LOW (ref 12.0–15.0)
Lymphocytes Relative: 47 %
Lymphs Abs: 2.1 10*3/uL (ref 0.7–4.0)
MCH: 31.1 pg (ref 26.0–34.0)
MCHC: 33.6 g/dL (ref 30.0–36.0)
MCV: 92.3 fL (ref 78.0–100.0)
Monocytes Absolute: 0.3 10*3/uL (ref 0.1–1.0)
Monocytes Relative: 6 %
Neutro Abs: 2.1 10*3/uL (ref 1.7–7.7)
Neutrophils Relative %: 46 %
Platelets: 254 10*3/uL (ref 150–400)
RBC: 3.51 MIL/uL — ABNORMAL LOW (ref 3.87–5.11)
RDW: 13.3 % (ref 11.5–15.5)
WBC: 4.5 10*3/uL (ref 4.0–10.5)

## 2016-08-25 LAB — I-STAT TROPONIN, ED: Troponin i, poc: 0 ng/mL (ref 0.00–0.08)

## 2016-08-25 MED ORDER — SODIUM CHLORIDE 0.9 % IV BOLUS (SEPSIS)
500.0000 mL | Freq: Once | INTRAVENOUS | Status: AC
  Administered 2016-08-25: 500 mL via INTRAVENOUS

## 2016-08-25 NOTE — ED Notes (Signed)
Pt could not urinate at this time; will attempt at later date.

## 2016-08-25 NOTE — Discharge Instructions (Signed)
Encourage hydration.  Follow up with your primary care provider if symptoms persist.  Return to ER for new or worsening symptoms, any additional concerns.

## 2016-08-25 NOTE — ED Notes (Signed)
Pt transported to MRI 

## 2016-08-25 NOTE — ED Notes (Signed)
Pt back from MRI 

## 2016-08-25 NOTE — ED Provider Notes (Signed)
[facility] DEPT Provider Note   CSN: [id number] Arrival date & time: 08/25/16  1426     History   Chief Complaint Chief Complaint  Patient presents with  . Dizziness    HPI [name] is a 83 y.o. female.  The history is provided by the patient, a relative and medical records. A language interpreter was used ([employer] Production manager).  Dizziness  Associated symptoms: weakness    [name] is a 83 y.o. female  with a PMH of HTN, HLD, DM, CKD, dementia who presents to the Emergency Department complaining of dizziness that began this morning. Patient resides at [facility]. She ate lunch and shortly after began throwing up. Just had 1-2 episodes of emesis and that resolved. No diarrhea. No n/v now. She went back to her room when she began feeling dizzy and fell back on to the bed. She denies LOC. No fevers. Still feels a little dizzy but improving. No muscle weakness. She is deaf and has somewhat slurred speech at baseline but no reported change in speech.   Past Medical History:  Diagnosis Date  . Alcohol abuse   . Anemia   . CKD (chronic kidney disease)   . Dementia   . Diabetes mellitus   . Hypercholesteremia   . Hypertension     There are no active problems to display for this patient.   History reviewed. No pertinent surgical history.  OB History    No data available       Home Medications    Prior to Admission medications   Medication Sig Start Date End Date Taking? Authorizing Provider  acetaminophen (TYLENOL) 500 MG tablet Take 1,000 mg by mouth 3 (three) times daily as needed for mild pain.    Historical Provider, MD  aspirin EC 81 MG tablet Take 81 mg by mouth daily.    Historical Provider, MD  carvedilol (COREG) 25 MG tablet Take 25 mg by mouth 2 (two) times daily with a meal.  10/30/15   Historical Provider, MD  cloNIDine (CATAPRES) 0.3 MG tablet Take 0.3 mg by mouth daily.     Historical Provider, MD    Dextromethorphan-Guaifenesin (ROBITUSSIN SUGAR FREE) 10-100 MG/5ML liquid Take 10 mLs by mouth 3 (three) times daily as needed (cough).    Historical Provider, MD  donepezil (ARICEPT) 10 MG tablet Take 10 mg by mouth at bedtime.      Historical Provider, MD  doxazosin (CARDURA) 8 MG tablet Take 16 mg by mouth at bedtime.     Historical Provider, MD  escitalopram (LEXAPRO) 10 MG tablet Take 10 mg by mouth daily.      Historical Provider, MD  hydroxypropyl methylcellulose / hypromellose (ISOPTO TEARS / GONIOVISC) 2.5 % ophthalmic solution Place 1 drop into the right eye 4 (four) times daily. Patient not taking: Reported on 05/24/2016 08/07/15   Forde Dandy, MD  losartan (COZAAR) 100 MG tablet Take 100 mg by mouth daily.      Historical Provider, MD  lovastatin (MEVACOR) 40 MG tablet Take 40 mg by mouth at bedtime.      Historical Provider, MD  Magnesium Sulfate, Laxative, (EPSOM SALT PO) Apply 1 scoop topically daily. For foot soaks    Historical Provider, MD  metFORMIN (GLUCOPHAGE) 1000 MG tablet Take 1,000 mg by mouth 2 (two) times daily with a meal.    Historical Provider, MD  OLANZapine (ZYPREXA) 2.5 MG tablet Take 2.5 mg by mouth 2 (two) times daily.  Historical Provider, MD  Propylene Glycol (SYSTANE BALANCE OP) Apply to eye. Instill 1 drop into the right eye 4 times daily    Historical Provider, MD  sodium chloride (OCEAN) 0.65 % SOLN nasal spray Place 1 spray into both nostrils daily as needed for congestion (nasal dryness).     Historical Provider, MD  traZODone (DESYREL) 50 MG tablet Take 50 mg by mouth at bedtime.      Historical Provider, MD    Family History History reviewed. No pertinent family history.  Social History Social History  Substance Use Topics  . Smoking status: Former Research scientist (life sciences)  . Smokeless tobacco: Never Used  . Alcohol use Yes     Allergies   Patient has no known allergies.   Review of Systems Review of Systems  Neurological: Positive for dizziness and  weakness. Negative for syncope.  All other systems reviewed and are negative.    Physical Exam Updated Vital Signs BP (!) 141/86   Pulse (!) 58   Temp 97.5 F (36.4 C)   Resp 16   SpO2 95%   Physical Exam  Constitutional: She is oriented to person, place, and time. She appears well-developed and well-nourished. No distress.  HENT:  Head: Normocephalic and atraumatic.  Cardiovascular: Normal rate, regular rhythm and normal heart sounds.   No murmur heard. Pulmonary/Chest: Effort normal and breath sounds normal. No respiratory distress.  Abdominal: Soft. She exhibits no distension. There is no tenderness.  Neurological: She is alert and oriented to person, place, and time.  No drift. 4/4 muscle strength in all four extremities. Deaf. Sensation intact x 4.  Skin: Skin is warm and dry.  Nursing note and vitals reviewed.    ED Treatments / Results  Labs (all labs ordered are listed, but only abnormal results are displayed) Labs Reviewed  CBC WITH DIFFERENTIAL/PLATELET - Abnormal; Notable for the following:       Result Value   RBC 3.51 (*)    Hemoglobin 10.9 (*)    HCT 32.4 (*)    All other components within normal limits  BASIC METABOLIC PANEL - Abnormal; Notable for the following:    Glucose, Bld 125 (*)    Creatinine, Ser 1.47 (*)    GFR calc non Af Amer 32 (*)    GFR calc Af Amer 37 (*)    All other components within normal limits  URINALYSIS, ROUTINE W REFLEX MICROSCOPIC - Abnormal; Notable for the following:    APPearance HAZY (*)    Protein, ur 30 (*)    Bacteria, UA RARE (*)    Squamous Epithelial / LPF 0-5 (*)    All other components within normal limits  I-STAT TROPOININ, ED    EKG  EKG Interpretation None       Radiology Mr Brain Wo Contrast  Result Date: 08/25/2016 CLINICAL DATA:  83 y/o  F; headache and dizziness. EXAM: MRI HEAD WITHOUT CONTRAST TECHNIQUE: Multiplanar, multiecho pulse sequences of the brain and surrounding structures were  obtained without intravenous contrast. COMPARISON:  05/24/2016 CT head. 08/07/2015 MRI of head. 05/15/2009 CT of the head. FINDINGS: Brain: No acute infarction, hemorrhage, hydrocephalus, extra-axial collection or mass lesion. Interval small chronic infarction within left frontal periventricular white matter. Otherwise moderate chronic microvascular ischemic changes of white matter are stable. Mild brain parenchymal volume loss. Vascular: Normal flow voids. Skull and upper cervical spine: Stable low signal focus within the right parietal bone near the confluence of sagittal and lambdoid sutures with stable sclerotic lesion from 2011  on CT. Sinuses/Orbits: Mild right maxillary sinus and right ethmoid air cell mucosal thickening. Otherwise no abnormal signal of paranasal sinuses or mastoid air cells. Orbits are unremarkable. Other: None. IMPRESSION: 1. No acute intracranial abnormality. 2. Interval small chronic infarction left frontal periventricular white matter. 3. Otherwise stable moderate chronic microvascular ischemic changes and mild parenchymal volume loss of the brain. 4. Stable right occipital sclerotic bone lesion. Electronically Signed   By: Kristine Garbe M.D.   On: 08/25/2016 17:17    Procedures Procedures (including critical care time)  Medications Ordered in ED Medications  sodium chloride 0.9 % bolus 500 mL (0 mLs Intravenous Stopped 08/25/16 1808)     Initial Impression / Assessment and Plan / ED Course  I have reviewed the triage vital signs and the nursing notes.  Pertinent labs & imaging results that were available during my care of the patient were reviewed by me and considered in my medical decision making (see chart for details).    [name] is a 83 y.o. female who presents to ED for acute onset of dizziness today. Did have episode of n/v prior to onset. MRI negative for acute process. Bump in cr from 1.24 to 1.47, otherwise baseline. Urine not infectious.  Patient given 1L fluids. On re-evaluation, dizziness resolved. Family feels comfortable with discharge back to facility with PCP follow up. All questions answered.   Patient discussed with Dr. Venora Maples who agrees with treatment plan.    Final Clinical Impressions(s) / ED Diagnoses   Final diagnoses:  Dizziness  Dehydration    New Prescriptions Discharge Medication List as of 08/25/2016  6:02 PM       [facility] Adalbert Alberto, PA-C 08/25/16 1918    Jola Schmidt, MD 08/28/16 [phone]

## 2016-08-25 NOTE — ED Notes (Signed)
Pt's family verbalized understanding of d/c instructions and has no further questions. Pt to be taken back to facility by family. VSS, NAD.

## 2016-08-25 NOTE — ED Notes (Signed)
Patient transported to MRI 

## 2016-08-25 NOTE — ED Notes (Signed)
Pt on bedpan.

## 2016-08-25 NOTE — ED Triage Notes (Addendum)
PT from [facility]. Staff state she came into nursing office and said she had headache; staff state she was hypotensive in 45W systolic. EMS on scene state diaphoretic and clammy. She reports she fell and hit head. No neck of back pain reported. A&Ox3. No signs of distress. C/o headache only. CBG 121. VSS per EMS

## 2016-08-25 NOTE — ED Notes (Signed)
Pt does report she is dizzy. Walks with walker per baseline.

## 2016-08-30 DIAGNOSIS — I1310 Hypertensive heart and chronic kidney disease without heart failure, with stage 1 through stage 4 chronic kidney disease, or unspecified chronic kidney disease: Secondary | ICD-10-CM | POA: Diagnosis not present

## 2016-08-30 DIAGNOSIS — E1122 Type 2 diabetes mellitus with diabetic chronic kidney disease: Secondary | ICD-10-CM | POA: Diagnosis not present

## 2016-08-30 DIAGNOSIS — H9190 Unspecified hearing loss, unspecified ear: Secondary | ICD-10-CM | POA: Diagnosis not present

## 2016-08-30 DIAGNOSIS — F0390 Unspecified dementia without behavioral disturbance: Secondary | ICD-10-CM | POA: Diagnosis not present

## 2016-08-30 DIAGNOSIS — R41841 Cognitive communication deficit: Secondary | ICD-10-CM | POA: Diagnosis not present

## 2016-08-30 DIAGNOSIS — N183 Chronic kidney disease, stage 3 (moderate): Secondary | ICD-10-CM | POA: Diagnosis not present

## 2016-09-01 DIAGNOSIS — H9190 Unspecified hearing loss, unspecified ear: Secondary | ICD-10-CM | POA: Diagnosis not present

## 2016-09-01 DIAGNOSIS — R41841 Cognitive communication deficit: Secondary | ICD-10-CM | POA: Diagnosis not present

## 2016-09-01 DIAGNOSIS — E1122 Type 2 diabetes mellitus with diabetic chronic kidney disease: Secondary | ICD-10-CM | POA: Diagnosis not present

## 2016-09-01 DIAGNOSIS — I1310 Hypertensive heart and chronic kidney disease without heart failure, with stage 1 through stage 4 chronic kidney disease, or unspecified chronic kidney disease: Secondary | ICD-10-CM | POA: Diagnosis not present

## 2016-09-01 DIAGNOSIS — N183 Chronic kidney disease, stage 3 (moderate): Secondary | ICD-10-CM | POA: Diagnosis not present

## 2016-09-01 DIAGNOSIS — F0390 Unspecified dementia without behavioral disturbance: Secondary | ICD-10-CM | POA: Diagnosis not present

## 2016-09-02 DIAGNOSIS — I1310 Hypertensive heart and chronic kidney disease without heart failure, with stage 1 through stage 4 chronic kidney disease, or unspecified chronic kidney disease: Secondary | ICD-10-CM | POA: Diagnosis not present

## 2016-09-02 DIAGNOSIS — R41841 Cognitive communication deficit: Secondary | ICD-10-CM | POA: Diagnosis not present

## 2016-09-02 DIAGNOSIS — F0390 Unspecified dementia without behavioral disturbance: Secondary | ICD-10-CM | POA: Diagnosis not present

## 2016-09-02 DIAGNOSIS — N183 Chronic kidney disease, stage 3 (moderate): Secondary | ICD-10-CM | POA: Diagnosis not present

## 2016-09-02 DIAGNOSIS — E1122 Type 2 diabetes mellitus with diabetic chronic kidney disease: Secondary | ICD-10-CM | POA: Diagnosis not present

## 2016-09-02 DIAGNOSIS — H9190 Unspecified hearing loss, unspecified ear: Secondary | ICD-10-CM | POA: Diagnosis not present

## 2016-09-05 DIAGNOSIS — Z79899 Other long term (current) drug therapy: Secondary | ICD-10-CM | POA: Diagnosis not present

## 2016-09-05 DIAGNOSIS — I10 Essential (primary) hypertension: Secondary | ICD-10-CM | POA: Diagnosis not present

## 2016-09-05 DIAGNOSIS — G309 Alzheimer's disease, unspecified: Secondary | ICD-10-CM | POA: Diagnosis not present

## 2016-09-05 DIAGNOSIS — E119 Type 2 diabetes mellitus without complications: Secondary | ICD-10-CM | POA: Diagnosis not present

## 2016-09-05 DIAGNOSIS — E785 Hyperlipidemia, unspecified: Secondary | ICD-10-CM | POA: Diagnosis not present

## 2016-09-06 DIAGNOSIS — I1310 Hypertensive heart and chronic kidney disease without heart failure, with stage 1 through stage 4 chronic kidney disease, or unspecified chronic kidney disease: Secondary | ICD-10-CM | POA: Diagnosis not present

## 2016-09-06 DIAGNOSIS — R41841 Cognitive communication deficit: Secondary | ICD-10-CM | POA: Diagnosis not present

## 2016-09-06 DIAGNOSIS — F0390 Unspecified dementia without behavioral disturbance: Secondary | ICD-10-CM | POA: Diagnosis not present

## 2016-09-06 DIAGNOSIS — E1122 Type 2 diabetes mellitus with diabetic chronic kidney disease: Secondary | ICD-10-CM | POA: Diagnosis not present

## 2016-09-06 DIAGNOSIS — H9190 Unspecified hearing loss, unspecified ear: Secondary | ICD-10-CM | POA: Diagnosis not present

## 2016-09-06 DIAGNOSIS — N183 Chronic kidney disease, stage 3 (moderate): Secondary | ICD-10-CM | POA: Diagnosis not present

## 2016-09-08 DIAGNOSIS — I1310 Hypertensive heart and chronic kidney disease without heart failure, with stage 1 through stage 4 chronic kidney disease, or unspecified chronic kidney disease: Secondary | ICD-10-CM | POA: Diagnosis not present

## 2016-09-08 DIAGNOSIS — E1122 Type 2 diabetes mellitus with diabetic chronic kidney disease: Secondary | ICD-10-CM | POA: Diagnosis not present

## 2016-09-08 DIAGNOSIS — N183 Chronic kidney disease, stage 3 (moderate): Secondary | ICD-10-CM | POA: Diagnosis not present

## 2016-09-08 DIAGNOSIS — R41841 Cognitive communication deficit: Secondary | ICD-10-CM | POA: Diagnosis not present

## 2016-09-08 DIAGNOSIS — F0390 Unspecified dementia without behavioral disturbance: Secondary | ICD-10-CM | POA: Diagnosis not present

## 2016-09-08 DIAGNOSIS — H9190 Unspecified hearing loss, unspecified ear: Secondary | ICD-10-CM | POA: Diagnosis not present

## 2016-09-12 DIAGNOSIS — I1310 Hypertensive heart and chronic kidney disease without heart failure, with stage 1 through stage 4 chronic kidney disease, or unspecified chronic kidney disease: Secondary | ICD-10-CM | POA: Diagnosis not present

## 2016-09-12 DIAGNOSIS — R41841 Cognitive communication deficit: Secondary | ICD-10-CM | POA: Diagnosis not present

## 2016-09-12 DIAGNOSIS — E1122 Type 2 diabetes mellitus with diabetic chronic kidney disease: Secondary | ICD-10-CM | POA: Diagnosis not present

## 2016-09-12 DIAGNOSIS — H9190 Unspecified hearing loss, unspecified ear: Secondary | ICD-10-CM | POA: Diagnosis not present

## 2016-09-12 DIAGNOSIS — N183 Chronic kidney disease, stage 3 (moderate): Secondary | ICD-10-CM | POA: Diagnosis not present

## 2016-09-12 DIAGNOSIS — F0390 Unspecified dementia without behavioral disturbance: Secondary | ICD-10-CM | POA: Diagnosis not present

## 2016-09-22 DIAGNOSIS — E119 Type 2 diabetes mellitus without complications: Secondary | ICD-10-CM | POA: Diagnosis not present

## 2016-09-22 DIAGNOSIS — E785 Hyperlipidemia, unspecified: Secondary | ICD-10-CM | POA: Diagnosis not present

## 2016-09-22 DIAGNOSIS — Z79899 Other long term (current) drug therapy: Secondary | ICD-10-CM | POA: Diagnosis not present

## 2016-09-26 DIAGNOSIS — E785 Hyperlipidemia, unspecified: Secondary | ICD-10-CM | POA: Diagnosis not present

## 2016-09-26 DIAGNOSIS — H9190 Unspecified hearing loss, unspecified ear: Secondary | ICD-10-CM | POA: Diagnosis not present

## 2016-09-26 DIAGNOSIS — E119 Type 2 diabetes mellitus without complications: Secondary | ICD-10-CM | POA: Diagnosis not present

## 2016-09-26 DIAGNOSIS — Z79899 Other long term (current) drug therapy: Secondary | ICD-10-CM | POA: Diagnosis not present

## 2016-09-26 DIAGNOSIS — N179 Acute kidney failure, unspecified: Secondary | ICD-10-CM | POA: Diagnosis not present

## 2016-09-26 DIAGNOSIS — R946 Abnormal results of thyroid function studies: Secondary | ICD-10-CM | POA: Diagnosis not present

## 2016-09-29 DIAGNOSIS — M2010 Hallux valgus (acquired), unspecified foot: Secondary | ICD-10-CM | POA: Diagnosis not present

## 2016-09-29 DIAGNOSIS — H9190 Unspecified hearing loss, unspecified ear: Secondary | ICD-10-CM | POA: Diagnosis not present

## 2016-09-29 DIAGNOSIS — E119 Type 2 diabetes mellitus without complications: Secondary | ICD-10-CM | POA: Diagnosis not present

## 2016-09-29 DIAGNOSIS — B351 Tinea unguium: Secondary | ICD-10-CM | POA: Diagnosis not present

## 2016-10-03 DIAGNOSIS — F329 Major depressive disorder, single episode, unspecified: Secondary | ICD-10-CM | POA: Diagnosis not present

## 2016-10-03 DIAGNOSIS — Z79899 Other long term (current) drug therapy: Secondary | ICD-10-CM | POA: Diagnosis not present

## 2016-10-03 DIAGNOSIS — I10 Essential (primary) hypertension: Secondary | ICD-10-CM | POA: Diagnosis not present

## 2016-10-03 DIAGNOSIS — H04129 Dry eye syndrome of unspecified lacrimal gland: Secondary | ICD-10-CM | POA: Diagnosis not present

## 2016-10-03 DIAGNOSIS — G4700 Insomnia, unspecified: Secondary | ICD-10-CM | POA: Diagnosis not present

## 2016-10-31 DIAGNOSIS — E784 Other hyperlipidemia: Secondary | ICD-10-CM | POA: Diagnosis not present

## 2016-10-31 DIAGNOSIS — E119 Type 2 diabetes mellitus without complications: Secondary | ICD-10-CM | POA: Diagnosis not present

## 2016-10-31 DIAGNOSIS — G309 Alzheimer's disease, unspecified: Secondary | ICD-10-CM | POA: Diagnosis not present

## 2016-10-31 DIAGNOSIS — Z79899 Other long term (current) drug therapy: Secondary | ICD-10-CM | POA: Diagnosis not present

## 2016-10-31 DIAGNOSIS — R2689 Other abnormalities of gait and mobility: Secondary | ICD-10-CM | POA: Diagnosis not present

## 2016-11-18 DIAGNOSIS — Z79899 Other long term (current) drug therapy: Secondary | ICD-10-CM | POA: Diagnosis not present

## 2016-11-30 DIAGNOSIS — Z79899 Other long term (current) drug therapy: Secondary | ICD-10-CM | POA: Diagnosis not present

## 2016-12-01 DIAGNOSIS — M2010 Hallux valgus (acquired), unspecified foot: Secondary | ICD-10-CM | POA: Diagnosis not present

## 2016-12-01 DIAGNOSIS — L605 Yellow nail syndrome: Secondary | ICD-10-CM | POA: Diagnosis not present

## 2016-12-01 DIAGNOSIS — E119 Type 2 diabetes mellitus without complications: Secondary | ICD-10-CM | POA: Diagnosis not present

## 2016-12-01 DIAGNOSIS — B351 Tinea unguium: Secondary | ICD-10-CM | POA: Diagnosis not present

## 2016-12-01 DIAGNOSIS — H9190 Unspecified hearing loss, unspecified ear: Secondary | ICD-10-CM | POA: Diagnosis not present

## 2016-12-12 DIAGNOSIS — G308 Other Alzheimer's disease: Secondary | ICD-10-CM | POA: Diagnosis not present

## 2016-12-12 DIAGNOSIS — H9190 Unspecified hearing loss, unspecified ear: Secondary | ICD-10-CM | POA: Diagnosis not present

## 2016-12-12 DIAGNOSIS — I10 Essential (primary) hypertension: Secondary | ICD-10-CM | POA: Diagnosis not present

## 2016-12-12 DIAGNOSIS — N179 Acute kidney failure, unspecified: Secondary | ICD-10-CM | POA: Diagnosis not present

## 2016-12-12 DIAGNOSIS — R946 Abnormal results of thyroid function studies: Secondary | ICD-10-CM | POA: Diagnosis not present

## 2016-12-12 DIAGNOSIS — Z79899 Other long term (current) drug therapy: Secondary | ICD-10-CM | POA: Diagnosis not present

## 2017-01-02 DIAGNOSIS — Z79899 Other long term (current) drug therapy: Secondary | ICD-10-CM | POA: Diagnosis not present

## 2017-01-02 DIAGNOSIS — I150 Renovascular hypertension: Secondary | ICD-10-CM | POA: Diagnosis not present

## 2017-01-02 DIAGNOSIS — R946 Abnormal results of thyroid function studies: Secondary | ICD-10-CM | POA: Diagnosis not present

## 2017-01-02 DIAGNOSIS — F3289 Other specified depressive episodes: Secondary | ICD-10-CM | POA: Diagnosis not present

## 2017-01-02 DIAGNOSIS — N189 Chronic kidney disease, unspecified: Secondary | ICD-10-CM | POA: Diagnosis not present

## 2017-01-06 DIAGNOSIS — Z79899 Other long term (current) drug therapy: Secondary | ICD-10-CM | POA: Diagnosis not present

## 2017-01-06 DIAGNOSIS — E119 Type 2 diabetes mellitus without complications: Secondary | ICD-10-CM | POA: Diagnosis not present

## 2017-01-16 DIAGNOSIS — N189 Chronic kidney disease, unspecified: Secondary | ICD-10-CM | POA: Diagnosis not present

## 2017-01-16 DIAGNOSIS — M79651 Pain in right thigh: Secondary | ICD-10-CM | POA: Diagnosis not present

## 2017-01-16 DIAGNOSIS — I150 Renovascular hypertension: Secondary | ICD-10-CM | POA: Diagnosis not present

## 2017-01-16 DIAGNOSIS — D631 Anemia in chronic kidney disease: Secondary | ICD-10-CM | POA: Diagnosis not present

## 2017-01-16 DIAGNOSIS — E1129 Type 2 diabetes mellitus with other diabetic kidney complication: Secondary | ICD-10-CM | POA: Diagnosis not present

## 2017-01-16 DIAGNOSIS — Z79899 Other long term (current) drug therapy: Secondary | ICD-10-CM | POA: Diagnosis not present

## 2017-01-16 DIAGNOSIS — M79652 Pain in left thigh: Secondary | ICD-10-CM | POA: Diagnosis not present

## 2017-02-02 DIAGNOSIS — Z79899 Other long term (current) drug therapy: Secondary | ICD-10-CM | POA: Diagnosis not present

## 2017-02-02 DIAGNOSIS — E119 Type 2 diabetes mellitus without complications: Secondary | ICD-10-CM | POA: Diagnosis not present

## 2017-02-02 DIAGNOSIS — D631 Anemia in chronic kidney disease: Secondary | ICD-10-CM | POA: Diagnosis not present

## 2017-02-07 DIAGNOSIS — B351 Tinea unguium: Secondary | ICD-10-CM | POA: Diagnosis not present

## 2017-02-07 DIAGNOSIS — E119 Type 2 diabetes mellitus without complications: Secondary | ICD-10-CM | POA: Diagnosis not present

## 2017-02-07 DIAGNOSIS — M2010 Hallux valgus (acquired), unspecified foot: Secondary | ICD-10-CM | POA: Diagnosis not present

## 2017-02-07 DIAGNOSIS — R262 Difficulty in walking, not elsewhere classified: Secondary | ICD-10-CM | POA: Diagnosis not present

## 2017-02-09 DIAGNOSIS — Z79899 Other long term (current) drug therapy: Secondary | ICD-10-CM | POA: Diagnosis not present

## 2017-02-09 DIAGNOSIS — E119 Type 2 diabetes mellitus without complications: Secondary | ICD-10-CM | POA: Diagnosis not present

## 2017-02-09 DIAGNOSIS — D641 Secondary sideroblastic anemia due to disease: Secondary | ICD-10-CM | POA: Diagnosis not present

## 2017-02-10 ENCOUNTER — Emergency Department (HOSPITAL_COMMUNITY): Payer: Medicare Other

## 2017-02-10 ENCOUNTER — Emergency Department (HOSPITAL_COMMUNITY)
Admission: EM | Admit: 2017-02-10 | Discharge: 2017-02-10 | Disposition: A | Discharge status: Home / Self Care | Payer: Medicare Other | Attending: Emergency Medicine | Admitting: Emergency Medicine

## 2017-02-10 ENCOUNTER — Encounter (HOSPITAL_COMMUNITY): Payer: Self-pay | Admitting: Emergency Medicine

## 2017-02-10 DIAGNOSIS — N189 Chronic kidney disease, unspecified: Secondary | ICD-10-CM | POA: Diagnosis not present

## 2017-02-10 DIAGNOSIS — R55 Syncope and collapse: Secondary | ICD-10-CM | POA: Diagnosis not present

## 2017-02-10 DIAGNOSIS — E1122 Type 2 diabetes mellitus with diabetic chronic kidney disease: Secondary | ICD-10-CM | POA: Insufficient documentation

## 2017-02-10 DIAGNOSIS — R4182 Altered mental status, unspecified: Secondary | ICD-10-CM | POA: Diagnosis not present

## 2017-02-10 DIAGNOSIS — Z87891 Personal history of nicotine dependence: Secondary | ICD-10-CM | POA: Insufficient documentation

## 2017-02-10 DIAGNOSIS — I129 Hypertensive chronic kidney disease with stage 1 through stage 4 chronic kidney disease, or unspecified chronic kidney disease: Secondary | ICD-10-CM | POA: Diagnosis not present

## 2017-02-10 DIAGNOSIS — F0390 Unspecified dementia without behavioral disturbance: Secondary | ICD-10-CM | POA: Insufficient documentation

## 2017-02-10 DIAGNOSIS — R001 Bradycardia, unspecified: Secondary | ICD-10-CM | POA: Diagnosis not present

## 2017-02-10 DIAGNOSIS — I499 Cardiac arrhythmia, unspecified: Secondary | ICD-10-CM | POA: Diagnosis not present

## 2017-02-10 DIAGNOSIS — Z7984 Long term (current) use of oral hypoglycemic drugs: Secondary | ICD-10-CM | POA: Diagnosis not present

## 2017-02-10 DIAGNOSIS — Z79899 Other long term (current) drug therapy: Secondary | ICD-10-CM | POA: Diagnosis not present

## 2017-02-10 DIAGNOSIS — G4489 Other headache syndrome: Secondary | ICD-10-CM | POA: Diagnosis not present

## 2017-02-10 LAB — COMPREHENSIVE METABOLIC PANEL
ALT: 12 U/L — ABNORMAL LOW (ref 14–54)
AST: 16 U/L (ref 15–41)
Albumin: 3.7 g/dL (ref 3.5–5.0)
Alkaline Phosphatase: 78 U/L (ref 38–126)
Anion gap: 9 (ref 5–15)
BUN: 16 mg/dL (ref 6–20)
CO2: 27 mmol/L (ref 22–32)
Calcium: 9.8 mg/dL (ref 8.9–10.3)
Chloride: 97 mmol/L — ABNORMAL LOW (ref 101–111)
Creatinine, Ser: 1.16 mg/dL — ABNORMAL HIGH (ref 0.44–1.00)
GFR calc Af Amer: 49 mL/min — ABNORMAL LOW (ref >60)
GFR calc non Af Amer: 43 mL/min — ABNORMAL LOW (ref >60)
Glucose, Bld: 122 mg/dL — ABNORMAL HIGH (ref 65–99)
Potassium: 3.9 mmol/L (ref 3.5–5.1)
Sodium: 133 mmol/L — ABNORMAL LOW (ref 135–145)
Total Bilirubin: 0.4 mg/dL (ref 0.3–1.2)
Total Protein: 6.6 g/dL (ref 6.5–8.1)

## 2017-02-10 LAB — URINALYSIS, ROUTINE W REFLEX MICROSCOPIC
Bilirubin Urine: NEGATIVE
Glucose, UA: NEGATIVE mg/dL
Hgb urine dipstick: NEGATIVE
Ketones, ur: NEGATIVE mg/dL
Leukocytes, UA: NEGATIVE
Nitrite: NEGATIVE
Protein, ur: NEGATIVE mg/dL
Specific Gravity, Urine: 1.011 (ref 1.005–1.030)
pH: 6 (ref 5.0–8.0)

## 2017-02-10 LAB — CBC WITH DIFFERENTIAL/PLATELET
Basophils Absolute: 0 10*3/uL (ref 0.0–0.1)
Basophils Relative: 1 %
Eosinophils Absolute: 0.3 10*3/uL (ref 0.0–0.7)
Eosinophils Relative: 5 %
HCT: 32.4 % — ABNORMAL LOW (ref 36.0–46.0)
Hemoglobin: 10.8 g/dL — ABNORMAL LOW (ref 12.0–15.0)
Lymphocytes Relative: 27 %
Lymphs Abs: 1.4 10*3/uL (ref 0.7–4.0)
MCH: 30.9 pg (ref 26.0–34.0)
MCHC: 33.3 g/dL (ref 30.0–36.0)
MCV: 92.6 fL (ref 78.0–100.0)
Monocytes Absolute: 0.3 10*3/uL (ref 0.1–1.0)
Monocytes Relative: 6 %
Neutro Abs: 3.1 10*3/uL (ref 1.7–7.7)
Neutrophils Relative %: 61 %
Platelets: 307 10*3/uL (ref 150–400)
RBC: 3.5 MIL/uL — ABNORMAL LOW (ref 3.87–5.11)
RDW: 12.7 % (ref 11.5–15.5)
WBC: 5.1 10*3/uL (ref 4.0–10.5)

## 2017-02-10 LAB — I-STAT TROPONIN, ED: Troponin i, poc: 0 ng/mL (ref 0.00–0.08)

## 2017-02-10 MED ORDER — SODIUM CHLORIDE 0.9 % IV BOLUS (SEPSIS): 500.0000 mL | Freq: Once | INTRAVENOUS | Status: DC

## 2017-02-10 NOTE — ED Notes (Signed)
ED Provider at bedside. 

## 2017-02-10 NOTE — ED Triage Notes (Signed)
Pt here from nursing home with c/o syncopal episode , staff called it a seizure but no "shaking "wittnessed by staff , pt is deaf but awake on arrival

## 2017-02-10 NOTE — ED Notes (Signed)
Removed IV placed in L AC by EMS. Site is clean, dry and intact. Catheter intact upon removal.

## 2017-02-10 NOTE — ED Notes (Signed)
Patient transported to CT 

## 2017-02-10 NOTE — Discharge Instructions (Signed)
Return if any problems.  Drink plenty of fluids.

## 2017-02-10 NOTE — ED Notes (Signed)
Pt gave urine sample, however it was contaminated with toilet paper and BM.

## 2017-02-10 NOTE — ED Provider Notes (Signed)
[facility] EMERGENCY DEPARTMENT Provider Note   CSN: [id number] Arrival date & time: 02/10/17  1000     History   Chief Complaint Chief Complaint  Patient presents with  . Loss of Consciousness    HPI [name] is a 83 y.o. female.  The history is provided by the patient and the spouse. The history is limited by a language barrier. A language interpreter was used.  Loss of Consciousness   This is a new problem. The current episode started yesterday. The problem occurs constantly. The problem has been gradually worsening. There was no loss of consciousness. Pertinent negatives include chest pain and visual change. She has tried nothing for the symptoms. The treatment provided no relief. Her past medical history is significant for DM and HTN. Her past medical history does not include seizures.    Past Medical History:  Diagnosis Date  . Alcohol abuse   . Anemia   . CKD (chronic kidney disease)   . Dementia   . Diabetes mellitus   . Hypercholesteremia   . Hypertension     There are no active problems to display for this patient.   History reviewed. No pertinent surgical history.  OB History    No data available       Home Medications    Prior to Admission medications   Medication Sig Start Date End Date Taking? Authorizing Provider  acetaminophen (TYLENOL) 325 MG tablet Take 650 mg by mouth every 4 (four) hours as needed for mild pain.   Yes [provider]  aluminum sulfate-calcium acetate (DOMEBORO) packet Apply 2 packets topically daily. Dissolve two packets in one quart warm water, soak both feet for 20 minutes daily after bath/showers x4 weeks   Yes [provider]  cloNIDine (CATAPRES) 0.3 MG tablet Take 0.3 mg by mouth daily.    Yes [provider]  clotrimazole-betamethasone (LOTRISONE) cream Apply 1 application topically 2 (two) times daily.   Yes [provider]  donepezil (ARICEPT) 10 MG  tablet Take 10 mg by mouth at bedtime.     Yes [provider]  doxazosin (CARDURA) 8 MG tablet Take 16 mg by mouth at bedtime.    Yes [provider]  escitalopram (LEXAPRO) 10 MG tablet Take 10 mg by mouth daily.     Yes [provider]  hydroxypropyl methylcellulose / hypromellose (ISOPTO TEARS / GONIOVISC) 2.5 % ophthalmic solution Place 1 drop into the right eye 4 (four) times daily. 08/07/15  Yes Forde Dandy, MD  losartan (COZAAR) 100 MG tablet Take 100 mg by mouth daily.     Yes [provider]  lovastatin (MEVACOR) 40 MG tablet Take 40 mg by mouth at bedtime.     Yes [provider]  metFORMIN (GLUCOPHAGE) 1000 MG tablet Take 1,000 mg by mouth 2 (two) times daily with a meal.   Yes [provider]  OLANZapine (ZYPREXA) 2.5 MG tablet Take 2.5 mg by mouth 2 (two) times daily.    Yes [provider]  traZODone (DESYREL) 50 MG tablet Take 50 mg by mouth at bedtime.     Yes [provider]    Family History History reviewed. No pertinent family history.  Social History Social History  Substance Use Topics  . Smoking status: Former Research scientist (life sciences)  . Smokeless tobacco: Never Used  . Alcohol use Yes     Allergies   Patient has no known allergies.   Review of Systems Review of Systems  Cardiovascular: Positive for syncope. Negative for chest pain.  All other systems reviewed and are negative.    Physical Exam Updated Vital Signs BP (!) 164/84   Pulse (!) 56   Resp 13   SpO2 99%   Physical Exam  Constitutional: She appears well-developed and well-nourished. No distress.  HENT:  Head: Normocephalic and atraumatic.  Eyes: Conjunctivae are normal.  Neck: Neck supple.  Cardiovascular: Normal rate and regular rhythm.   No murmur heard. Pulmonary/Chest: Effort normal and breath sounds normal. No respiratory distress.  Abdominal: Soft. There is no tenderness.  Musculoskeletal: She exhibits no edema.    Neurological: She is alert. No cranial nerve deficit. Coordination normal.  Skin: Skin is warm and dry.  Psychiatric: She has a normal mood and affect.  Nursing note and vitals reviewed. Pt lives in a nursing home.  Family reports pt receives good care. No concerns.  Pt denies any complaints.  Pt reports she felt well yesterday.  Pt reports she feels tired today.    ED Treatments / Results  Labs (all labs ordered are listed, but only abnormal results are displayed) Labs Reviewed  CBC WITH DIFFERENTIAL/PLATELET - Abnormal; Notable for the following:       Result Value   RBC 3.50 (*)    Hemoglobin 10.8 (*)    HCT 32.4 (*)    All other components within normal limits  COMPREHENSIVE METABOLIC PANEL - Abnormal; Notable for the following:    Sodium 133 (*)    Chloride 97 (*)    Glucose, Bld 122 (*)    Creatinine, Ser 1.16 (*)    ALT 12 (*)    GFR calc non Af Amer 43 (*)    GFR calc Af Amer 49 (*)    All other components within normal limits  URINALYSIS, ROUTINE W REFLEX MICROSCOPIC  I-STAT TROPONIN, ED    EKG  EKG Interpretation  Date/Time:  Friday February 10 2017 12:13:56 EDT Ventricular Rate:  56 PR Interval:    QRS Duration: 93 QT Interval:  430 QTC Calculation: 415 R Axis:   21 Text Interpretation:  Sinus rhythm No significant change since last tracing Confirmed by Gareth Morgan [phone]) on 02/10/2017 1:13:50 PM       Radiology Dg Chest 2 View  Result Date: 02/10/2017 CLINICAL DATA:  Syncope EXAM: CHEST  2 VIEW COMPARISON:  05/24/2016 chest radiograph. FINDINGS: Stable cardiomediastinal silhouette with mild cardiomegaly and aortic atherosclerosis. No pneumothorax. No pleural effusion. Lungs appear clear, with no acute consolidative airspace disease and no pulmonary edema. IMPRESSION: Stable mild cardiomegaly without pulmonary edema. No active pulmonary disease. Electronically Signed   By: Ilona Sorrel M.D.   On: 02/10/2017 11:46   Ct Head Wo Contrast  Result  Date: 02/10/2017 CLINICAL DATA:  Syncope with altered mental status. History of dementia EXAM: CT HEAD WITHOUT CONTRAST TECHNIQUE: Contiguous axial images were obtained from the base of the skull through the vertex without intravenous contrast. COMPARISON:  Head CT May 24, 2016 and brain MRI Aug 25, 2016 FINDINGS: Brain: There is age related volume loss. There is no intracranial mass hemorrhage, extra-axial fluid collection, or midline shift. There is patchy small vessel disease throughout the centra semiovale bilaterally. There are scattered lacunar type infarcts in the left internal and external capsules as well as a small infarct in the anterior left lentiform nucleus. There is also small vessel disease in the anterior limb of the right external and internal capsules. There is no new gray-white compartment lesion. No  acute infarct evident. Vascular: There is no appreciable hyperdense vessel. There are foci of calcification in each carotid artery. Skull: Bony calvarium appears intact. Sinuses/Orbits: There is mild mucosal thickening in the right maxillary antrum. There is mucosal thickening in several ethmoid air cells bilaterally. Other visualized paranasal sinuses are clear. Orbits appear symmetric bilaterally. Other: Mastoid air cells are clear. IMPRESSION: Age related volume loss with supratentorial small vessel disease, stable. Basal ganglia lacunar type infarcts, more on the left than on the right, are stable. No acute infarct is demonstrable. There are foci of arterial vascular calcification. There are areas of paranasal sinus disease. Electronically Signed   By: Lowella Grip III M.D.   On: 02/10/2017 12:03    Procedures Procedures (including critical care time)  Medications Ordered in ED Medications - No data to display   Initial Impression / Assessment and Plan / ED Course  I have reviewed the triage vital signs and the nursing notes.  Pertinent labs & imaging results that were  available during my care of the patient were reviewed by me and considered in my medical decision making (see chart for details).    Pt observed on monitor.  No abnormalities,  Vitals stable,  Hypertensive,  Pt tolerating po fluids.  Pt denies any complaints.  Ct no acute abnormality,  Ua negative,  Pt wants to return home.  Pt advised to continue current medications.  Final Clinical Impressions(s) / ED Diagnoses   Final diagnoses:  Syncope and collapse  Syncope, unspecified syncope type    New Prescriptions New Prescriptions   No medications on file  An After Visit Summary was printed and given to the patient.   Fransico Meadow, [place] 02/10/17 1740    Gareth Morgan, MD 02/11/17 1341

## 2017-02-13 DIAGNOSIS — I150 Renovascular hypertension: Secondary | ICD-10-CM | POA: Diagnosis not present

## 2017-02-13 DIAGNOSIS — Z79899 Other long term (current) drug therapy: Secondary | ICD-10-CM | POA: Diagnosis not present

## 2017-02-13 DIAGNOSIS — G308 Other Alzheimer's disease: Secondary | ICD-10-CM | POA: Diagnosis not present

## 2017-02-13 DIAGNOSIS — G4709 Other insomnia: Secondary | ICD-10-CM | POA: Diagnosis not present

## 2017-02-22 DIAGNOSIS — F0390 Unspecified dementia without behavioral disturbance: Secondary | ICD-10-CM | POA: Diagnosis not present

## 2017-02-22 DIAGNOSIS — R260 Ataxic gait: Secondary | ICD-10-CM | POA: Diagnosis not present

## 2017-02-22 DIAGNOSIS — N183 Chronic kidney disease, stage 3 (moderate): Secondary | ICD-10-CM | POA: Diagnosis not present

## 2017-02-22 DIAGNOSIS — I1310 Hypertensive heart and chronic kidney disease without heart failure, with stage 1 through stage 4 chronic kidney disease, or unspecified chronic kidney disease: Secondary | ICD-10-CM | POA: Diagnosis not present

## 2017-02-22 DIAGNOSIS — M6281 Muscle weakness (generalized): Secondary | ICD-10-CM | POA: Diagnosis not present

## 2017-02-22 DIAGNOSIS — E1122 Type 2 diabetes mellitus with diabetic chronic kidney disease: Secondary | ICD-10-CM | POA: Diagnosis not present

## 2017-02-27 DIAGNOSIS — E1129 Type 2 diabetes mellitus with other diabetic kidney complication: Secondary | ICD-10-CM | POA: Diagnosis not present

## 2017-02-27 DIAGNOSIS — Z79899 Other long term (current) drug therapy: Secondary | ICD-10-CM | POA: Diagnosis not present

## 2017-02-27 DIAGNOSIS — I6389 Other cerebral infarction: Secondary | ICD-10-CM | POA: Diagnosis not present

## 2017-02-27 DIAGNOSIS — I150 Renovascular hypertension: Secondary | ICD-10-CM | POA: Diagnosis not present

## 2017-02-27 DIAGNOSIS — R2689 Other abnormalities of gait and mobility: Secondary | ICD-10-CM | POA: Diagnosis not present

## 2017-02-27 DIAGNOSIS — D508 Other iron deficiency anemias: Secondary | ICD-10-CM | POA: Diagnosis not present

## 2017-02-27 DIAGNOSIS — N189 Chronic kidney disease, unspecified: Secondary | ICD-10-CM | POA: Diagnosis not present

## 2017-02-28 DIAGNOSIS — N183 Chronic kidney disease, stage 3 (moderate): Secondary | ICD-10-CM | POA: Diagnosis not present

## 2017-02-28 DIAGNOSIS — F0390 Unspecified dementia without behavioral disturbance: Secondary | ICD-10-CM | POA: Diagnosis not present

## 2017-02-28 DIAGNOSIS — E1122 Type 2 diabetes mellitus with diabetic chronic kidney disease: Secondary | ICD-10-CM | POA: Diagnosis not present

## 2017-02-28 DIAGNOSIS — M6281 Muscle weakness (generalized): Secondary | ICD-10-CM | POA: Diagnosis not present

## 2017-02-28 DIAGNOSIS — I1310 Hypertensive heart and chronic kidney disease without heart failure, with stage 1 through stage 4 chronic kidney disease, or unspecified chronic kidney disease: Secondary | ICD-10-CM | POA: Diagnosis not present

## 2017-02-28 DIAGNOSIS — R260 Ataxic gait: Secondary | ICD-10-CM | POA: Diagnosis not present

## 2017-03-02 DIAGNOSIS — N183 Chronic kidney disease, stage 3 (moderate): Secondary | ICD-10-CM | POA: Diagnosis not present

## 2017-03-02 DIAGNOSIS — E1122 Type 2 diabetes mellitus with diabetic chronic kidney disease: Secondary | ICD-10-CM | POA: Diagnosis not present

## 2017-03-02 DIAGNOSIS — I1310 Hypertensive heart and chronic kidney disease without heart failure, with stage 1 through stage 4 chronic kidney disease, or unspecified chronic kidney disease: Secondary | ICD-10-CM | POA: Diagnosis not present

## 2017-03-02 DIAGNOSIS — R260 Ataxic gait: Secondary | ICD-10-CM | POA: Diagnosis not present

## 2017-03-02 DIAGNOSIS — M6281 Muscle weakness (generalized): Secondary | ICD-10-CM | POA: Diagnosis not present

## 2017-03-02 DIAGNOSIS — F0390 Unspecified dementia without behavioral disturbance: Secondary | ICD-10-CM | POA: Diagnosis not present

## 2017-03-04 DIAGNOSIS — G309 Alzheimer's disease, unspecified: Secondary | ICD-10-CM | POA: Diagnosis not present

## 2017-03-04 DIAGNOSIS — E1129 Type 2 diabetes mellitus with other diabetic kidney complication: Secondary | ICD-10-CM | POA: Diagnosis not present

## 2017-03-04 DIAGNOSIS — N189 Chronic kidney disease, unspecified: Secondary | ICD-10-CM | POA: Diagnosis not present

## 2017-03-04 DIAGNOSIS — G308 Other Alzheimer's disease: Secondary | ICD-10-CM | POA: Diagnosis not present

## 2017-03-04 DIAGNOSIS — D631 Anemia in chronic kidney disease: Secondary | ICD-10-CM | POA: Diagnosis not present

## 2017-03-04 DIAGNOSIS — I10 Essential (primary) hypertension: Secondary | ICD-10-CM | POA: Diagnosis not present

## 2017-03-04 DIAGNOSIS — Z79899 Other long term (current) drug therapy: Secondary | ICD-10-CM | POA: Diagnosis not present

## 2017-03-07 DIAGNOSIS — M6281 Muscle weakness (generalized): Secondary | ICD-10-CM | POA: Diagnosis not present

## 2017-03-07 DIAGNOSIS — I1310 Hypertensive heart and chronic kidney disease without heart failure, with stage 1 through stage 4 chronic kidney disease, or unspecified chronic kidney disease: Secondary | ICD-10-CM | POA: Diagnosis not present

## 2017-03-07 DIAGNOSIS — E1122 Type 2 diabetes mellitus with diabetic chronic kidney disease: Secondary | ICD-10-CM | POA: Diagnosis not present

## 2017-03-07 DIAGNOSIS — N183 Chronic kidney disease, stage 3 (moderate): Secondary | ICD-10-CM | POA: Diagnosis not present

## 2017-03-07 DIAGNOSIS — F0390 Unspecified dementia without behavioral disturbance: Secondary | ICD-10-CM | POA: Diagnosis not present

## 2017-03-07 DIAGNOSIS — R260 Ataxic gait: Secondary | ICD-10-CM | POA: Diagnosis not present

## 2017-03-08 DIAGNOSIS — I1310 Hypertensive heart and chronic kidney disease without heart failure, with stage 1 through stage 4 chronic kidney disease, or unspecified chronic kidney disease: Secondary | ICD-10-CM | POA: Diagnosis not present

## 2017-03-08 DIAGNOSIS — F0390 Unspecified dementia without behavioral disturbance: Secondary | ICD-10-CM | POA: Diagnosis not present

## 2017-03-08 DIAGNOSIS — N183 Chronic kidney disease, stage 3 (moderate): Secondary | ICD-10-CM | POA: Diagnosis not present

## 2017-03-08 DIAGNOSIS — E1122 Type 2 diabetes mellitus with diabetic chronic kidney disease: Secondary | ICD-10-CM | POA: Diagnosis not present

## 2017-03-08 DIAGNOSIS — R260 Ataxic gait: Secondary | ICD-10-CM | POA: Diagnosis not present

## 2017-03-08 DIAGNOSIS — M6281 Muscle weakness (generalized): Secondary | ICD-10-CM | POA: Diagnosis not present

## 2017-03-08 DIAGNOSIS — Z23 Encounter for immunization: Secondary | ICD-10-CM | POA: Diagnosis not present

## 2017-03-13 DIAGNOSIS — E1122 Type 2 diabetes mellitus with diabetic chronic kidney disease: Secondary | ICD-10-CM | POA: Diagnosis not present

## 2017-03-13 DIAGNOSIS — M6281 Muscle weakness (generalized): Secondary | ICD-10-CM | POA: Diagnosis not present

## 2017-03-13 DIAGNOSIS — R260 Ataxic gait: Secondary | ICD-10-CM | POA: Diagnosis not present

## 2017-03-13 DIAGNOSIS — N183 Chronic kidney disease, stage 3 (moderate): Secondary | ICD-10-CM | POA: Diagnosis not present

## 2017-03-13 DIAGNOSIS — F0390 Unspecified dementia without behavioral disturbance: Secondary | ICD-10-CM | POA: Diagnosis not present

## 2017-03-13 DIAGNOSIS — I1310 Hypertensive heart and chronic kidney disease without heart failure, with stage 1 through stage 4 chronic kidney disease, or unspecified chronic kidney disease: Secondary | ICD-10-CM | POA: Diagnosis not present

## 2017-03-20 DIAGNOSIS — M6281 Muscle weakness (generalized): Secondary | ICD-10-CM | POA: Diagnosis not present

## 2017-03-20 DIAGNOSIS — I1310 Hypertensive heart and chronic kidney disease without heart failure, with stage 1 through stage 4 chronic kidney disease, or unspecified chronic kidney disease: Secondary | ICD-10-CM | POA: Diagnosis not present

## 2017-03-20 DIAGNOSIS — R260 Ataxic gait: Secondary | ICD-10-CM | POA: Diagnosis not present

## 2017-03-20 DIAGNOSIS — N183 Chronic kidney disease, stage 3 (moderate): Secondary | ICD-10-CM | POA: Diagnosis not present

## 2017-03-20 DIAGNOSIS — E1122 Type 2 diabetes mellitus with diabetic chronic kidney disease: Secondary | ICD-10-CM | POA: Diagnosis not present

## 2017-03-20 DIAGNOSIS — F0390 Unspecified dementia without behavioral disturbance: Secondary | ICD-10-CM | POA: Diagnosis not present

## 2017-03-21 DIAGNOSIS — H25813 Combined forms of age-related cataract, bilateral: Secondary | ICD-10-CM | POA: Diagnosis not present

## 2017-03-21 DIAGNOSIS — H40013 Open angle with borderline findings, low risk, bilateral: Secondary | ICD-10-CM | POA: Diagnosis not present

## 2017-03-21 DIAGNOSIS — H35033 Hypertensive retinopathy, bilateral: Secondary | ICD-10-CM | POA: Diagnosis not present

## 2017-03-21 DIAGNOSIS — E119 Type 2 diabetes mellitus without complications: Secondary | ICD-10-CM | POA: Diagnosis not present

## 2017-03-23 DIAGNOSIS — R260 Ataxic gait: Secondary | ICD-10-CM | POA: Diagnosis not present

## 2017-03-23 DIAGNOSIS — F0390 Unspecified dementia without behavioral disturbance: Secondary | ICD-10-CM | POA: Diagnosis not present

## 2017-03-23 DIAGNOSIS — N183 Chronic kidney disease, stage 3 (moderate): Secondary | ICD-10-CM | POA: Diagnosis not present

## 2017-03-23 DIAGNOSIS — I1310 Hypertensive heart and chronic kidney disease without heart failure, with stage 1 through stage 4 chronic kidney disease, or unspecified chronic kidney disease: Secondary | ICD-10-CM | POA: Diagnosis not present

## 2017-03-23 DIAGNOSIS — E1122 Type 2 diabetes mellitus with diabetic chronic kidney disease: Secondary | ICD-10-CM | POA: Diagnosis not present

## 2017-03-23 DIAGNOSIS — M6281 Muscle weakness (generalized): Secondary | ICD-10-CM | POA: Diagnosis not present

## 2017-03-27 DIAGNOSIS — F3289 Other specified depressive episodes: Secondary | ICD-10-CM | POA: Diagnosis not present

## 2017-03-27 DIAGNOSIS — I150 Renovascular hypertension: Secondary | ICD-10-CM | POA: Diagnosis not present

## 2017-03-27 DIAGNOSIS — H04129 Dry eye syndrome of unspecified lacrimal gland: Secondary | ICD-10-CM | POA: Diagnosis not present

## 2017-03-27 DIAGNOSIS — Z79899 Other long term (current) drug therapy: Secondary | ICD-10-CM | POA: Diagnosis not present

## 2017-03-27 DIAGNOSIS — K5909 Other constipation: Secondary | ICD-10-CM | POA: Diagnosis not present

## 2017-03-28 DIAGNOSIS — M6281 Muscle weakness (generalized): Secondary | ICD-10-CM | POA: Diagnosis not present

## 2017-03-28 DIAGNOSIS — E1122 Type 2 diabetes mellitus with diabetic chronic kidney disease: Secondary | ICD-10-CM | POA: Diagnosis not present

## 2017-03-28 DIAGNOSIS — N183 Chronic kidney disease, stage 3 (moderate): Secondary | ICD-10-CM | POA: Diagnosis not present

## 2017-03-28 DIAGNOSIS — R260 Ataxic gait: Secondary | ICD-10-CM | POA: Diagnosis not present

## 2017-03-28 DIAGNOSIS — F0390 Unspecified dementia without behavioral disturbance: Secondary | ICD-10-CM | POA: Diagnosis not present

## 2017-03-28 DIAGNOSIS — I1310 Hypertensive heart and chronic kidney disease without heart failure, with stage 1 through stage 4 chronic kidney disease, or unspecified chronic kidney disease: Secondary | ICD-10-CM | POA: Diagnosis not present

## 2017-03-30 DIAGNOSIS — I10 Essential (primary) hypertension: Secondary | ICD-10-CM | POA: Diagnosis not present

## 2017-03-30 DIAGNOSIS — N189 Chronic kidney disease, unspecified: Secondary | ICD-10-CM | POA: Diagnosis not present

## 2017-03-30 DIAGNOSIS — F0390 Unspecified dementia without behavioral disturbance: Secondary | ICD-10-CM | POA: Diagnosis not present

## 2017-03-30 DIAGNOSIS — E785 Hyperlipidemia, unspecified: Secondary | ICD-10-CM | POA: Diagnosis not present

## 2017-03-30 DIAGNOSIS — R260 Ataxic gait: Secondary | ICD-10-CM | POA: Diagnosis not present

## 2017-03-30 DIAGNOSIS — I150 Renovascular hypertension: Secondary | ICD-10-CM | POA: Diagnosis not present

## 2017-03-30 DIAGNOSIS — N183 Chronic kidney disease, stage 3 (moderate): Secondary | ICD-10-CM | POA: Diagnosis not present

## 2017-03-30 DIAGNOSIS — M6281 Muscle weakness (generalized): Secondary | ICD-10-CM | POA: Diagnosis not present

## 2017-03-30 DIAGNOSIS — E1122 Type 2 diabetes mellitus with diabetic chronic kidney disease: Secondary | ICD-10-CM | POA: Diagnosis not present

## 2017-03-30 DIAGNOSIS — I1310 Hypertensive heart and chronic kidney disease without heart failure, with stage 1 through stage 4 chronic kidney disease, or unspecified chronic kidney disease: Secondary | ICD-10-CM | POA: Diagnosis not present

## 2017-04-04 DIAGNOSIS — F0390 Unspecified dementia without behavioral disturbance: Secondary | ICD-10-CM | POA: Diagnosis not present

## 2017-04-04 DIAGNOSIS — N183 Chronic kidney disease, stage 3 (moderate): Secondary | ICD-10-CM | POA: Diagnosis not present

## 2017-04-04 DIAGNOSIS — M6281 Muscle weakness (generalized): Secondary | ICD-10-CM | POA: Diagnosis not present

## 2017-04-04 DIAGNOSIS — R260 Ataxic gait: Secondary | ICD-10-CM | POA: Diagnosis not present

## 2017-04-04 DIAGNOSIS — E1122 Type 2 diabetes mellitus with diabetic chronic kidney disease: Secondary | ICD-10-CM | POA: Diagnosis not present

## 2017-04-04 DIAGNOSIS — I1310 Hypertensive heart and chronic kidney disease without heart failure, with stage 1 through stage 4 chronic kidney disease, or unspecified chronic kidney disease: Secondary | ICD-10-CM | POA: Diagnosis not present

## 2017-04-06 DIAGNOSIS — M6281 Muscle weakness (generalized): Secondary | ICD-10-CM | POA: Diagnosis not present

## 2017-04-06 DIAGNOSIS — N183 Chronic kidney disease, stage 3 (moderate): Secondary | ICD-10-CM | POA: Diagnosis not present

## 2017-04-06 DIAGNOSIS — R260 Ataxic gait: Secondary | ICD-10-CM | POA: Diagnosis not present

## 2017-04-06 DIAGNOSIS — F0390 Unspecified dementia without behavioral disturbance: Secondary | ICD-10-CM | POA: Diagnosis not present

## 2017-04-06 DIAGNOSIS — I1310 Hypertensive heart and chronic kidney disease without heart failure, with stage 1 through stage 4 chronic kidney disease, or unspecified chronic kidney disease: Secondary | ICD-10-CM | POA: Diagnosis not present

## 2017-04-06 DIAGNOSIS — E1122 Type 2 diabetes mellitus with diabetic chronic kidney disease: Secondary | ICD-10-CM | POA: Diagnosis not present

## 2017-04-10 DIAGNOSIS — H25813 Combined forms of age-related cataract, bilateral: Secondary | ICD-10-CM | POA: Diagnosis not present

## 2017-04-10 DIAGNOSIS — H25812 Combined forms of age-related cataract, left eye: Secondary | ICD-10-CM | POA: Diagnosis not present

## 2017-04-10 DIAGNOSIS — H2513 Age-related nuclear cataract, bilateral: Secondary | ICD-10-CM | POA: Diagnosis not present

## 2017-04-11 DIAGNOSIS — H25813 Combined forms of age-related cataract, bilateral: Secondary | ICD-10-CM | POA: Diagnosis not present

## 2017-04-12 DIAGNOSIS — M6281 Muscle weakness (generalized): Secondary | ICD-10-CM | POA: Diagnosis not present

## 2017-04-12 DIAGNOSIS — F0390 Unspecified dementia without behavioral disturbance: Secondary | ICD-10-CM | POA: Diagnosis not present

## 2017-04-12 DIAGNOSIS — E1122 Type 2 diabetes mellitus with diabetic chronic kidney disease: Secondary | ICD-10-CM | POA: Diagnosis not present

## 2017-04-12 DIAGNOSIS — N183 Chronic kidney disease, stage 3 (moderate): Secondary | ICD-10-CM | POA: Diagnosis not present

## 2017-04-12 DIAGNOSIS — H2513 Age-related nuclear cataract, bilateral: Secondary | ICD-10-CM | POA: Diagnosis not present

## 2017-04-12 DIAGNOSIS — I1310 Hypertensive heart and chronic kidney disease without heart failure, with stage 1 through stage 4 chronic kidney disease, or unspecified chronic kidney disease: Secondary | ICD-10-CM | POA: Diagnosis not present

## 2017-04-12 DIAGNOSIS — R260 Ataxic gait: Secondary | ICD-10-CM | POA: Diagnosis not present

## 2017-04-13 DIAGNOSIS — H25811 Combined forms of age-related cataract, right eye: Secondary | ICD-10-CM | POA: Diagnosis not present

## 2017-04-13 DIAGNOSIS — F0390 Unspecified dementia without behavioral disturbance: Secondary | ICD-10-CM | POA: Diagnosis not present

## 2017-04-13 DIAGNOSIS — Z961 Presence of intraocular lens: Secondary | ICD-10-CM | POA: Diagnosis not present

## 2017-04-13 DIAGNOSIS — I1310 Hypertensive heart and chronic kidney disease without heart failure, with stage 1 through stage 4 chronic kidney disease, or unspecified chronic kidney disease: Secondary | ICD-10-CM | POA: Diagnosis not present

## 2017-04-13 DIAGNOSIS — M6281 Muscle weakness (generalized): Secondary | ICD-10-CM | POA: Diagnosis not present

## 2017-04-13 DIAGNOSIS — E1122 Type 2 diabetes mellitus with diabetic chronic kidney disease: Secondary | ICD-10-CM | POA: Diagnosis not present

## 2017-04-13 DIAGNOSIS — R260 Ataxic gait: Secondary | ICD-10-CM | POA: Diagnosis not present

## 2017-04-13 DIAGNOSIS — N183 Chronic kidney disease, stage 3 (moderate): Secondary | ICD-10-CM | POA: Diagnosis not present

## 2017-04-20 DIAGNOSIS — Z961 Presence of intraocular lens: Secondary | ICD-10-CM | POA: Diagnosis not present

## 2017-05-03 ENCOUNTER — Ambulatory Visit: Payer: Self-pay | Admitting: Ophthalmology

## 2017-05-03 ENCOUNTER — Encounter (HOSPITAL_COMMUNITY): Payer: Self-pay | Admitting: *Deleted

## 2017-05-03 ENCOUNTER — Other Ambulatory Visit: Payer: Self-pay

## 2017-05-03 NOTE — Pre-Procedure Instructions (Signed)
[name]  05/03/2017      [facility] [place] [address] [place] [address] Phone: [phone] Fax: [phone]    Your procedure is scheduled on Thursday, May 04, 2017  Report to [facility] Admitting at 5:30 A.M.  Call this number if you have problems the morning of surgery:  [phone]   Remember:  Do not eat food or drink liquids after midnight.  Take these medicines the morning of surgery with A SIP OF WATER : aspirin, carvedilol (COREG), cloNIDine (CATAPRES),  escitalopram (LEXAPRO),  OLANZapine (ZYPREXA), eye drops, if needed: Tylenol for pain Stop taking vitamins, fish oil and herbal medications. Do not take any NSAIDs ie: Ibuprofen, Advil, Naproxen (Aleve), Motrin, BC and Goody Powder; stop now.    How to Manage Your Diabetes Before and After Surgery  Why is it important to control my blood sugar before and after surgery? . Improving blood sugar levels before and after surgery helps healing and can limit problems. . A way of improving blood sugar control is eating a healthy diet by: o  Eating less sugar and carbohydrates o  Increasing activity/exercise o  Talking with your doctor about reaching your blood sugar goals . High blood sugars (greater than 180 mg/dL) can raise your risk of infections and slow your recovery, so you will need to focus on controlling your diabetes during the weeks before surgery. . Make sure that the doctor who takes care of your diabetes knows about your planned surgery including the date and location.  How do I manage my blood sugar before surgery? . Check your blood sugar at least 4 times a day, starting 2 days before surgery, to make sure that the level is not too high or low. o Check your blood sugar the morning of your surgery when you wake up and every 2 hours until you get to the Short Stay unit. . If your blood sugar is less than 70 mg/dL, you will need to treat for low blood  sugar: o Do not take insulin. o Treat a low blood sugar (less than 70 mg/dL) with  cup of clear juice (cranberry or apple), 4 glucose tablets, OR glucose gel. Recheck blood sugar in 15 minutes after treatment (to make sure it is greater than 70 mg/dL). If your blood sugar is not greater than 70 mg/dL on recheck, call [phone] o  for further instructions. . Report your blood sugar to the short stay nurse when you get to Short Stay.  . If you are admitted to the hospital after surgery: o Your blood sugar will be checked by the staff and you will probably be given insulin after surgery (instead of oral diabetes medicines) to make sure you have good blood sugar levels. o The goal for blood sugar control after surgery is 80-180 mg/d  WHAT DO I DO ABOUT MY DIABETES MEDICATION?  Marland Kitchen Do not take oral diabetes medicines (pills) the morning of surgery such as metFORMIN (GLUCOPHAGE)   Reviewed and Endorsed by [facility] Patient Education Committee, August 2015  Do not wear jewelry, make-up or nail polish.  Do not wear lotions, powders, or perfumes, or deodorant.  Do not shave 48 hours prior to surgery.    Do not bring valuables to the hospital.  [facility] is not responsible for any belongings or valuables.  Contacts, dentures or bridgework may not be worn into surgery.  Patients discharged the day of surgery will not  be allowed to drive home.

## 2017-05-03 NOTE — H&P (Signed)
Date of examination:  05/04/17  Indication for surgery: retained lens material left eye  Pertinent past medical history:  Past Medical History:  Diagnosis Date  . Alcohol abuse   . Anemia   . Cataract   . CKD (chronic kidney disease)   . Deaf   . Dementia   . Diabetes mellitus   . Dry eyes   . Hypercholesteremia   . Hyperlipidemia   . Hypertension     Pertinent ocular history:  Retained lens material left eye  Pertinent family history: No family history on file.  General:  Healthy appearing patient in no distress.     Eyes:    Acuity OD 20/40  OS CF  External: Within normal limits      Anterior segment: 3+ cell left eye   Fundus: hazy view left eye B-scan - retained lens material     Impression: Retained lens material left eye  Plan: Vitrectomy, endolaser, lensectomy left eye  Royston Cowper

## 2017-05-03 NOTE — H&P (Deleted)
  The note originally documented on this encounter has been moved the the encounter in which it belongs.  

## 2017-05-03 NOTE — Progress Notes (Signed)
Pt SDW-Pre-op call completed by Santiago Glad, Medical laboratory scientific officer. Santiago Glad denies that pt C/O SOB and chest pain. Santiago Glad denies record or notes of pt having a cardiac history. Please complete anesthesia assessment and Apnea Screening DOS; unable to assess. Pre-op instructions faxed and reviewed with Santiago Glad. Santiago Glad verbalized understanding of all pre-op instructions. Pt daughter, Rhina Brackett made aware of arrival time, location and phone # to [facility] unit. Helene Kelp verbalized understanding of instructions. Pat, Surgical Coordinator stated that MD advised that pt can continue taking Aspirin (okay to take Aspirin on DOS per [facility]). Spoke with Adria at " [employer] " to set up services for a sign language interpreter for tomorrow at 5:30 AM at [facility] Unit and post -op. Adria stated that she is waiting for a confirmation.

## 2017-05-04 ENCOUNTER — Ambulatory Visit (HOSPITAL_COMMUNITY)
Admission: RE | Admit: 2017-05-04 | Discharge: 2017-05-04 | Disposition: A | Discharge status: Home / Self Care | Payer: Medicare Other | Source: Ambulatory Visit | Attending: Ophthalmology | Admitting: Ophthalmology

## 2017-05-04 ENCOUNTER — Encounter (HOSPITAL_COMMUNITY)
Admission: RE | Disposition: A | Discharge status: Home / Self Care | Payer: Self-pay | Source: Ambulatory Visit | Attending: Ophthalmology

## 2017-05-04 ENCOUNTER — Ambulatory Visit (HOSPITAL_COMMUNITY): Payer: Medicare Other | Admitting: Anesthesiology

## 2017-05-04 ENCOUNTER — Encounter (HOSPITAL_COMMUNITY): Payer: Self-pay | Admitting: Anesthesiology

## 2017-05-04 DIAGNOSIS — H59022 Cataract (lens) fragments in eye following cataract surgery, left eye: Secondary | ICD-10-CM | POA: Diagnosis present

## 2017-05-04 DIAGNOSIS — I129 Hypertensive chronic kidney disease with stage 1 through stage 4 chronic kidney disease, or unspecified chronic kidney disease: Secondary | ICD-10-CM | POA: Insufficient documentation

## 2017-05-04 DIAGNOSIS — Z87891 Personal history of nicotine dependence: Secondary | ICD-10-CM | POA: Diagnosis not present

## 2017-05-04 DIAGNOSIS — E7800 Pure hypercholesterolemia, unspecified: Secondary | ICD-10-CM | POA: Insufficient documentation

## 2017-05-04 DIAGNOSIS — E785 Hyperlipidemia, unspecified: Secondary | ICD-10-CM | POA: Diagnosis not present

## 2017-05-04 DIAGNOSIS — F0390 Unspecified dementia without behavioral disturbance: Secondary | ICD-10-CM | POA: Insufficient documentation

## 2017-05-04 DIAGNOSIS — H9190 Unspecified hearing loss, unspecified ear: Secondary | ICD-10-CM | POA: Insufficient documentation

## 2017-05-04 DIAGNOSIS — H04123 Dry eye syndrome of bilateral lacrimal glands: Secondary | ICD-10-CM | POA: Insufficient documentation

## 2017-05-04 DIAGNOSIS — E1122 Type 2 diabetes mellitus with diabetic chronic kidney disease: Secondary | ICD-10-CM | POA: Insufficient documentation

## 2017-05-04 DIAGNOSIS — N189 Chronic kidney disease, unspecified: Secondary | ICD-10-CM | POA: Diagnosis not present

## 2017-05-04 DIAGNOSIS — Y849 Medical procedure, unspecified as the cause of abnormal reaction of the patient, or of later complication, without mention of misadventure at the time of the procedure: Secondary | ICD-10-CM | POA: Insufficient documentation

## 2017-05-04 DIAGNOSIS — D631 Anemia in chronic kidney disease: Secondary | ICD-10-CM | POA: Insufficient documentation

## 2017-05-04 HISTORY — DX: Unspecified hearing loss, unspecified ear: H91.90

## 2017-05-04 HISTORY — DX: Unspecified cataract: H26.9

## 2017-05-04 HISTORY — PX: PARS PLANA VITRECTOMY: SHX2166

## 2017-05-04 HISTORY — DX: Dry eye syndrome of bilateral lacrimal glands: H04.123

## 2017-05-04 HISTORY — DX: Hyperlipidemia, unspecified: E78.5

## 2017-05-04 LAB — BASIC METABOLIC PANEL
Anion gap: 8 (ref 5–15)
BUN: 20 mg/dL (ref 6–20)
CO2: 20 mmol/L — ABNORMAL LOW (ref 22–32)
Calcium: 9.6 mg/dL (ref 8.9–10.3)
Chloride: 107 mmol/L (ref 101–111)
Creatinine, Ser: 1.18 mg/dL — ABNORMAL HIGH (ref 0.44–1.00)
GFR calc Af Amer: 48 mL/min — ABNORMAL LOW (ref >60)
GFR calc non Af Amer: 42 mL/min — ABNORMAL LOW (ref >60)
Glucose, Bld: 96 mg/dL (ref 65–99)
Potassium: 3.4 mmol/L — ABNORMAL LOW (ref 3.5–5.1)
Sodium: 135 mmol/L (ref 135–145)

## 2017-05-04 LAB — GLUCOSE, CAPILLARY
Glucose-Capillary: 79 mg/dL (ref 65–99)
Glucose-Capillary: 83 mg/dL (ref 65–99)

## 2017-05-04 LAB — CBC
HCT: 31.8 % — ABNORMAL LOW (ref 36.0–46.0)
Hemoglobin: 10.5 g/dL — ABNORMAL LOW (ref 12.0–15.0)
MCH: 30.9 pg (ref 26.0–34.0)
MCHC: 33 g/dL (ref 30.0–36.0)
MCV: 93.5 fL (ref 78.0–100.0)
Platelets: 290 10*3/uL (ref 150–400)
RBC: 3.4 MIL/uL — ABNORMAL LOW (ref 3.87–5.11)
RDW: 13.8 % (ref 11.5–15.5)
WBC: 4 10*3/uL (ref 4.0–10.5)

## 2017-05-04 SURGERY — PARS PLANA VITRECTOMY WITH 25 GAUGE
Anesthesia: General | Site: Eye | Laterality: Left

## 2017-05-04 MED ORDER — TOBRAMYCIN-DEXAMETHASONE 0.3-0.1 % OP OINT
TOPICAL_OINTMENT | OPHTHALMIC | Status: AC
  Filled 2017-05-04: qty 3.5

## 2017-05-04 MED ORDER — PROPOFOL 10 MG/ML IV BOLUS
INTRAVENOUS | Status: AC
  Filled 2017-05-04: qty 20

## 2017-05-04 MED ORDER — HYPROMELLOSE (GONIOSCOPIC) 2.5 % OP SOLN
OPHTHALMIC | Status: AC
  Filled 2017-05-04: qty 15

## 2017-05-04 MED ORDER — FENTANYL CITRATE (PF) 250 MCG/5ML IJ SOLN
INTRAMUSCULAR | Status: AC
  Filled 2017-05-04: qty 5

## 2017-05-04 MED ORDER — HYDRALAZINE HCL 20 MG/ML IJ SOLN
INTRAMUSCULAR | Status: AC
  Filled 2017-05-04: qty 1

## 2017-05-04 MED ORDER — HYDRALAZINE HCL 20 MG/ML IJ SOLN
5.0000 mg | Freq: Once | INTRAMUSCULAR | Status: AC
  Administered 2017-05-04: 5 mg via INTRAVENOUS

## 2017-05-04 MED ORDER — CEFAZOLIN 200 MG/ML FOR DIALYSIS
INJECTION | INTRAOCULAR | Status: DC | PRN
  Administered 2017-05-04: .5 mL via INTRAPERITONEAL

## 2017-05-04 MED ORDER — GLYCOPYRROLATE 0.2 MG/ML IJ SOLN
INTRAMUSCULAR | Status: DC | PRN
  Administered 2017-05-04 (×2): 0.2 mg via INTRAVENOUS

## 2017-05-04 MED ORDER — DEXAMETHASONE SODIUM PHOSPHATE 10 MG/ML IJ SOLN
INTRAMUSCULAR | Status: AC
  Filled 2017-05-04: qty 1

## 2017-05-04 MED ORDER — ATROPINE SULFATE 1 % OP SOLN
OPHTHALMIC | Status: AC
  Filled 2017-05-04: qty 5

## 2017-05-04 MED ORDER — ONDANSETRON HCL 4 MG/2ML IJ SOLN
INTRAMUSCULAR | Status: AC
  Filled 2017-05-04: qty 2

## 2017-05-04 MED ORDER — PHENYLEPHRINE HCL 2.5 % OP SOLN
OPHTHALMIC | Status: AC
  Administered 2017-05-04: 1 [drp] via OPHTHALMIC
  Filled 2017-05-04: qty 2

## 2017-05-04 MED ORDER — PHENYLEPHRINE HCL 10 MG/ML IJ SOLN
INTRAMUSCULAR | Status: DC | PRN
  Administered 2017-05-04 (×3): 40 ug via INTRAVENOUS

## 2017-05-04 MED ORDER — EPINEPHRINE PF 1 MG/ML IJ SOLN
INTRAMUSCULAR | Status: AC
  Filled 2017-05-04: qty 1

## 2017-05-04 MED ORDER — BSS IO SOLN
INTRAOCULAR | Status: AC
  Filled 2017-05-04: qty 15

## 2017-05-04 MED ORDER — TROPICAMIDE 1 % OP SOLN
OPHTHALMIC | Status: AC
  Administered 2017-05-04: 1 [drp] via OPHTHALMIC
  Filled 2017-05-04: qty 15

## 2017-05-04 MED ORDER — PREDNISOLONE ACETATE 1 % OP SUSP
OPHTHALMIC | Status: AC
  Administered 2017-05-04: 1 [drp] via OPHTHALMIC
  Filled 2017-05-04: qty 5

## 2017-05-04 MED ORDER — EPINEPHRINE PF 1 MG/ML IJ SOLN
INTRAOCULAR | Status: DC | PRN
  Administered 2017-05-04: 500 mL

## 2017-05-04 MED ORDER — TROPICAMIDE 1 % OP SOLN
1.0000 [drp] | OPHTHALMIC | Status: AC | PRN
  Administered 2017-05-04 (×3): 1 [drp] via OPHTHALMIC

## 2017-05-04 MED ORDER — TETRACAINE HCL 0.5 % OP SOLN
OPHTHALMIC | Status: AC
  Filled 2017-05-04: qty 4

## 2017-05-04 MED ORDER — OFLOXACIN 0.3 % OP SOLN
1.0000 [drp] | OPHTHALMIC | Status: AC | PRN
  Administered 2017-05-04 (×3): 1 [drp] via OPHTHALMIC

## 2017-05-04 MED ORDER — BSS IO SOLN
INTRAOCULAR | Status: DC | PRN
  Administered 2017-05-04: 15 mL via INTRAOCULAR

## 2017-05-04 MED ORDER — PROPOFOL 10 MG/ML IV BOLUS
INTRAVENOUS | Status: DC | PRN
  Administered 2017-05-04: 100 mg via INTRAVENOUS

## 2017-05-04 MED ORDER — MEPERIDINE HCL 25 MG/ML IJ SOLN: 6.2500 mg | INTRAMUSCULAR | Status: DC | PRN

## 2017-05-04 MED ORDER — BSS PLUS IO SOLN
INTRAOCULAR | Status: AC
  Filled 2017-05-04: qty 500

## 2017-05-04 MED ORDER — METOCLOPRAMIDE HCL 5 MG/ML IJ SOLN: 10.0000 mg | Freq: Once | INTRAMUSCULAR | Status: DC | PRN

## 2017-05-04 MED ORDER — PREDNISOLONE ACETATE 1 % OP SUSP
1.0000 [drp] | OPHTHALMIC | Status: AC
  Administered 2017-05-04: 1 [drp] via OPHTHALMIC

## 2017-05-04 MED ORDER — LIDOCAINE HCL 2 % IJ SOLN
INTRAMUSCULAR | Status: AC
  Filled 2017-05-04: qty 20

## 2017-05-04 MED ORDER — CEFAZOLIN SUBCONJUNCTIVAL INJECTION 100 MG/0.5 ML
100.0000 mg | INJECTION | SUBCONJUNCTIVAL | Status: DC
  Filled 2017-05-04: qty 5

## 2017-05-04 MED ORDER — FLUORESCEIN SODIUM 1 MG OP STRP
ORAL_STRIP | OPHTHALMIC | Status: DC | PRN
  Administered 2017-05-04: 1 via OPHTHALMIC

## 2017-05-04 MED ORDER — HYALURONIDASE HUMAN 150 UNIT/ML IJ SOLN
INTRAMUSCULAR | Status: AC
  Filled 2017-05-04: qty 1

## 2017-05-04 MED ORDER — OFLOXACIN 0.3 % OP SOLN
OPHTHALMIC | Status: AC
  Administered 2017-05-04: 1 [drp] via OPHTHALMIC
  Filled 2017-05-04: qty 5

## 2017-05-04 MED ORDER — SODIUM CHLORIDE 0.9 % IV SOLN
INTRAVENOUS | Status: DC | PRN
  Administered 2017-05-04: 07:00:00 via INTRAVENOUS

## 2017-05-04 MED ORDER — LIDOCAINE HCL (CARDIAC) 20 MG/ML IV SOLN
INTRAVENOUS | Status: DC | PRN
  Administered 2017-05-04: 80 mg via INTRAVENOUS

## 2017-05-04 MED ORDER — PHENYLEPHRINE HCL 2.5 % OP SOLN
1.0000 [drp] | OPHTHALMIC | Status: AC | PRN
  Administered 2017-05-04 (×3): 1 [drp] via OPHTHALMIC

## 2017-05-04 MED ORDER — ONDANSETRON HCL 4 MG/2ML IJ SOLN
INTRAMUSCULAR | Status: DC | PRN
  Administered 2017-05-04: 4 mg via INTRAVENOUS

## 2017-05-04 MED ORDER — FENTANYL CITRATE (PF) 100 MCG/2ML IJ SOLN
INTRAMUSCULAR | Status: DC | PRN
  Administered 2017-05-04: 25 ug via INTRAVENOUS
  Administered 2017-05-04: 50 ug via INTRAVENOUS

## 2017-05-04 MED ORDER — BUPIVACAINE HCL (PF) 0.75 % IJ SOLN
INTRAMUSCULAR | Status: AC
  Filled 2017-05-04: qty 10

## 2017-05-04 MED ORDER — FENTANYL CITRATE (PF) 100 MCG/2ML IJ SOLN: 25.0000 ug | INTRAMUSCULAR | Status: DC | PRN

## 2017-05-04 MED ORDER — DEXAMETHASONE SODIUM PHOSPHATE 10 MG/ML IJ SOLN
INTRAMUSCULAR | Status: DC | PRN
  Administered 2017-05-04: 10 mg

## 2017-05-04 SURGICAL SUPPLY — 58 items
APPLICATOR COTTON TIP 6IN STRL (MISCELLANEOUS) ×2 IMPLANT
BLADE MVR KNIFE 20G (BLADE) IMPLANT
CANNULA ANT CHAM MAIN (OPHTHALMIC RELATED) IMPLANT
CANNULA DUAL BORE 23G (CANNULA) IMPLANT
CANNULA DUALBORE 25G (CANNULA) IMPLANT
CANNULA VLV SOFT TIP 25G (OPHTHALMIC) ×1 IMPLANT
CANNULA VLV SOFT TIP 25GA (OPHTHALMIC) ×2 IMPLANT
CAUTERY EYE LOW TEMP 1300F FIN (OPHTHALMIC RELATED) IMPLANT
CLSR STERI-STRIP ANTIMIC 1/2X4 (GAUZE/BANDAGES/DRESSINGS) ×2 IMPLANT
COVER MAYO STAND STRL (DRAPES) ×2 IMPLANT
DRAPE HALF SHEET 40X57 (DRAPES) ×2 IMPLANT
DRAPE INCISE 51X51 W/FILM STRL (DRAPES) ×2 IMPLANT
DRAPE RETRACTOR (MISCELLANEOUS) ×2 IMPLANT
ERASER HMR WETFIELD 23G BP (MISCELLANEOUS) IMPLANT
FORCEPS ECKARDT ILM 25G SERR (OPHTHALMIC RELATED) IMPLANT
FORCEPS GRIESHABER ILM 25G A (INSTRUMENTS) IMPLANT
GAS AUTO FILL CONSTEL (OPHTHALMIC)
GAS AUTO FILL CONSTELLATION (OPHTHALMIC) IMPLANT
GLOVE ECLIPSE 7.5 STRL STRAW (GLOVE) ×2 IMPLANT
GOWN STRL REUS W/ TWL LRG LVL3 (GOWN DISPOSABLE) ×1 IMPLANT
GOWN STRL REUS W/TWL LRG LVL3 (GOWN DISPOSABLE) ×2
HANDLE PNEUMATIC FOR CONSTEL (OPHTHALMIC) IMPLANT
KIT BASIN OR (CUSTOM PROCEDURE TRAY) ×2 IMPLANT
KIT ROOM TURNOVER OR (KITS) ×2 IMPLANT
LENS BIOM SUPER VIEW SET DISP (OPHTHALMIC RELATED) ×2 IMPLANT
MICROPICK 25G (MISCELLANEOUS)
NDL 18GX1X1/2 (RX/OR ONLY) (NEEDLE) ×1 IMPLANT
NDL 25GX 5/8IN NON SAFETY (NEEDLE) ×1 IMPLANT
NDL FILTER BLUNT 18X1 1/2 (NEEDLE) ×1 IMPLANT
NDL HYPO 25GX1X1/2 BEV (NEEDLE) IMPLANT
NDL HYPO 30X.5 LL (NEEDLE) ×2 IMPLANT
NDL RETROBULBAR 25GX1.5 (NEEDLE) ×1 IMPLANT
NEEDLE 18GX1X1/2 (RX/OR ONLY) (NEEDLE) ×2 IMPLANT
NEEDLE 25GX 5/8IN NON SAFETY (NEEDLE) ×2 IMPLANT
NEEDLE FILTER BLUNT 18X 1/2SAF (NEEDLE) ×1
NEEDLE FILTER BLUNT 18X1 1/2 (NEEDLE) ×1 IMPLANT
NEEDLE HYPO 25GX1X1/2 BEV (NEEDLE) IMPLANT
NEEDLE HYPO 30X.5 LL (NEEDLE) ×4 IMPLANT
NEEDLE RETROBULBAR 25GX1.5 (NEEDLE) ×2 IMPLANT
NS IRRIG 1000ML POUR BTL (IV SOLUTION) ×2 IMPLANT
PACK VITRECTOMY CUSTOM (CUSTOM PROCEDURE TRAY) ×2 IMPLANT
PAD ARMBOARD 7.5X6 YLW CONV (MISCELLANEOUS) ×4 IMPLANT
PAK PIK VITRECTOMY CVS 25GA (OPHTHALMIC) ×2 IMPLANT
PENCIL BIPOLAR 25GA STR DISP (OPHTHALMIC RELATED) IMPLANT
PICK MICROPICK 25G (MISCELLANEOUS) IMPLANT
PROBE LASER ILLUM FLEX CVD 25G (OPHTHALMIC) IMPLANT
ROLLS DENTAL (MISCELLANEOUS) IMPLANT
SCRAPER DIAMOND 25GA (OPHTHALMIC RELATED) IMPLANT
SOLUTION ANTI FOG 6CC (MISCELLANEOUS) ×2 IMPLANT
STOPCOCK 4 WAY LG BORE MALE ST (IV SETS) IMPLANT
SUT VICRYL 7 0 TG140 8 (SUTURE) ×2 IMPLANT
SUT VICRYL 8 0 TG140 8 (SUTURE) IMPLANT
SYR 10ML LL (SYRINGE) IMPLANT
SYR 20CC LL (SYRINGE) ×2 IMPLANT
SYR 5ML LL (SYRINGE) ×2 IMPLANT
SYR TB 1ML LUER SLIP (SYRINGE) IMPLANT
WATER STERILE IRR 1000ML POUR (IV SOLUTION) ×2 IMPLANT
WIPE INSTRUMENT VISIWIPE 73X73 (MISCELLANEOUS) IMPLANT

## 2017-05-04 NOTE — Anesthesia Preprocedure Evaluation (Signed)
Anesthesia Evaluation  Patient identified by MRN, date of birth, ID band Patient awake    Reviewed: Allergy & Precautions, NPO status , Patient's Chart, lab work & pertinent test results  Airway Mallampati: II  TM Distance: >3 FB Neck ROM: Full    Dental no notable dental hx. (+) Edentulous Upper, Edentulous Lower   Pulmonary former smoker,    Pulmonary exam normal breath sounds clear to auscultation       Cardiovascular hypertension, Pt. on medications Normal cardiovascular exam Rhythm:Regular Rate:Normal     Neuro/Psych Dementia Deaf ASL negative psych ROS   GI/Hepatic negative GI ROS, Neg liver ROS,   Endo/Other  negative endocrine ROSdiabetes, Type 2  Renal/GU Renal InsufficiencyRenal disease  negative genitourinary   Musculoskeletal negative musculoskeletal ROS (+)   Abdominal   Peds negative pediatric ROS (+)  Hematology negative hematology ROS (+)   Anesthesia Other Findings   Reproductive/Obstetrics negative OB ROS                             Anesthesia Physical Anesthesia Plan  ASA: II  Anesthesia Plan: General   Post-op Pain Management:    Induction: Intravenous  PONV Risk Score and Plan: 3 and Ondansetron and Treatment may vary due to age or medical condition  Airway Management Planned: LMA and Oral ETT  Additional Equipment:   Intra-op Plan:   Post-operative Plan: Extubation in OR  Informed Consent: I have reviewed the patients History and Physical, chart, labs and discussed the procedure including the risks, benefits and alternatives for the proposed anesthesia with the patient or authorized representative who has indicated his/her understanding and acceptance.   Dental advisory given  Plan Discussed with: CRNA  Anesthesia Plan Comments:         Anesthesia Quick Evaluation

## 2017-05-04 NOTE — Transfer of Care (Signed)
Immediate Anesthesia Transfer of Care Note  Patient: [name]  Procedure(s) Performed: PARS PLANA VITRECTOMY LEFT EYE WITH 25 GAUGE WITH ENDOLASER (Left Eye)  Patient Location: PACU  Anesthesia Type:General  Level of Consciousness: awake, alert , oriented and sedated  Airway & Oxygen Therapy: Patient Spontanous Breathing and Patient connected to nasal cannula oxygen  Post-op Assessment: Report given to RN, Post -op Vital signs reviewed and stable and Patient moving all extremities  Post vital signs: Reviewed and stable  Last Vitals:  Vitals:   05/04/17 0634  BP: 129/77  Pulse: (!) 56  Resp: 19  Temp: (!) 36.3 C  SpO2: 100%    Last Pain:  Vitals:   05/04/17 0634  TempSrc: Oral      Patients Stated Pain Goal: 2 (39/43/20 0379)  Complications: No apparent anesthesia complications

## 2017-05-04 NOTE — Anesthesia Procedure Notes (Signed)
Procedure Name: LMA Insertion Date/Time: 05/04/2017 7:54 AM Performed by: Scheryl Darter, CRNA Pre-anesthesia Checklist: Patient identified, Emergency Drugs available, Suction available and Patient being monitored Patient Re-evaluated:Patient Re-evaluated prior to induction Oxygen Delivery Method: Circle System Utilized Preoxygenation: Pre-oxygenation with 100% oxygen Induction Type: IV induction Ventilation: Mask ventilation without difficulty LMA: LMA inserted LMA Size: 4.0 Number of attempts: 1 Placement Confirmation: positive ETCO2 Tube secured with: Tape Dental Injury: Teeth and Oropharynx as per pre-operative assessment

## 2017-05-04 NOTE — Brief Op Note (Signed)
05/04/2017  9:42 AM  PATIENT:  [name]  84 y.o. female  PRE-OPERATIVE DIAGNOSIS:  RETAINED LENS FRAGMENT IN LEFT EYE  POST-OPERATIVE DIAGNOSIS:  RETAINED LENS FRAGMENT IN LEFT EYE  PROCEDURE:  Procedure(s): PARS PLANA VITRECTOMY LEFT EYE WITH 25 GAUGE WITH ENDOLASER (Left)  SURGEON:  Surgeon(s) and Role:    * Jalene Mullet, MD - Primary  PHYSICIAN ASSISTANT:   ASSISTANTS: none   ANESTHESIA:   general  EBL:  1 mL   BLOOD ADMINISTERED:none  DRAINS: none   LOCAL MEDICATIONS USED:  NONE  SPECIMEN:  No Specimen  DISPOSITION OF SPECIMEN:  N/A  COUNTS:  YES  TOURNIQUET:  * No tourniquets in log *  DICTATION: .Note written in EPIC  PLAN OF CARE: Discharge to home after PACU  PATIENT DISPOSITION:  PACU - hemodynamically stable.   Delay start of Pharmacological VTE agent (>24hrs) due to surgical blood loss or risk of bleeding: not applicable

## 2017-05-04 NOTE — Anesthesia Postprocedure Evaluation (Signed)
Anesthesia Post Note  Patient: [name]  Procedure(s) Performed: PARS PLANA VITRECTOMY LEFT EYE WITH 25 GAUGE WITH ENDOLASER (Left Eye)     Patient location during evaluation: PACU Anesthesia Type: General Level of consciousness: awake and alert Pain management: pain level controlled Vital Signs Assessment: post-procedure vital signs reviewed and stable Respiratory status: spontaneous breathing, nonlabored ventilation, respiratory function stable and patient connected to nasal cannula oxygen Cardiovascular status: blood pressure returned to baseline and stable Postop Assessment: no apparent nausea or vomiting Anesthetic complications: no    Last Vitals:  Vitals:   05/04/17 1000 05/04/17 1015  BP: (!) 163/110 (!) 155/95  Pulse: 70 78  Resp: 17 17  Temp:    SpO2: 100% 100%    Last Pain:  Vitals:   05/04/17 1000  TempSrc:   PainSc: 0-No pain                 Montez Hageman

## 2017-05-04 NOTE — Discharge Instructions (Signed)
Sleep on RIGHT side tonight and tomorrow night.     DO NOT RUB EYES Drop instructions:  Pred Forte 1 drop 4x/day left eye for 1 week, then 3x/day for 1 week, the 2x/day for 1 week, then 1x/day for 1 week, then stop (Pink or White top) Ofloxacin 4x/day for 1 week then stop (Tan top)

## 2017-05-04 NOTE — Op Note (Signed)
[name] 05/04/2017 Diagnosis: Retained lens material left eye  Procedure: Pars Plana Vitrectomy and Endolaser Operative Eye:  left eye  Surgeon: Royston Cowper Estimated Blood Loss: minimal Specimens for Pathology:  None Complications: none   The  patient was prepped and draped in the usual fashion for ocular surgery on the  left eye .  A lid speculum was placed.  Infusion line and trocar was placed at the 4 o'clock position approximately 3.5 mm from the surgical limbus.   The infusion line was allowed to run and then clamped when placed at the cannula opening. The line was inserted and secured to the drape with an adhesive strip.   Active trocars/cannula were placed at the 10 and 2 o'clock positions approximately 3.5 mm from the surgical limbus. The cannula was visualized in the vitreous cavity.  The light pipe and vitreous cutter were inserted into the vitreous cavity and a core vitrectomy was performed.  Care taken to remove the vitreous up to the vitreous base for 360 degrees.  Lens material was noted in the posterior segment and vitrectomized.  Additional lens material was noted just posterior the the lens capsule which was similarly vitrectomized.  3 rows of endolaser were applied 360 degrees to the periphery.  The 4 10-0 nylon sutures were removed and the prior cataract wound checked with flourscein and noted to be water tight.  A partial air-fluid exchange was performed.  The superior cannulas were sequentially removed with concommitant tamponade using a cotton tipped applicator and noted to be air tight.  The infusion line and trocar were removed and the sclerotomy was noted to be air tight with normal intraocular pressure by digital palpapation.  BSS was introduced in the eye to replace the air and limit any movement of the ACIOL. Subconjunctival injections of Ancef and Decadron were placed in the infero-medial quadrant.   The speculum and drapes were removed and the  eye was patched with Polymixin/Bacitracin ophthalmic ointment. An eye shield was placed and the patient was transferred alert and conversant with stable vital signs to the post operative recovery area.  The patient tolerated the procedure well and no complications were noted.  Royston Cowper MD

## 2017-05-05 ENCOUNTER — Encounter (HOSPITAL_COMMUNITY): Payer: Self-pay | Admitting: Ophthalmology

## 2017-08-22 ENCOUNTER — Emergency Department (HOSPITAL_COMMUNITY)
Admission: EM | Admit: 2017-08-22 | Discharge: 2017-08-22 | Disposition: A | Discharge status: Home / Self Care | Payer: Medicare Other | Attending: Emergency Medicine | Admitting: Emergency Medicine

## 2017-08-22 ENCOUNTER — Emergency Department (HOSPITAL_COMMUNITY): Payer: Medicare Other

## 2017-08-22 ENCOUNTER — Encounter (HOSPITAL_COMMUNITY): Payer: Self-pay | Admitting: Emergency Medicine

## 2017-08-22 DIAGNOSIS — E1122 Type 2 diabetes mellitus with diabetic chronic kidney disease: Secondary | ICD-10-CM | POA: Insufficient documentation

## 2017-08-22 DIAGNOSIS — F0390 Unspecified dementia without behavioral disturbance: Secondary | ICD-10-CM | POA: Insufficient documentation

## 2017-08-22 DIAGNOSIS — H9190 Unspecified hearing loss, unspecified ear: Secondary | ICD-10-CM | POA: Diagnosis not present

## 2017-08-22 DIAGNOSIS — Z7984 Long term (current) use of oral hypoglycemic drugs: Secondary | ICD-10-CM | POA: Diagnosis not present

## 2017-08-22 DIAGNOSIS — I129 Hypertensive chronic kidney disease with stage 1 through stage 4 chronic kidney disease, or unspecified chronic kidney disease: Secondary | ICD-10-CM | POA: Diagnosis not present

## 2017-08-22 DIAGNOSIS — M6281 Muscle weakness (generalized): Secondary | ICD-10-CM | POA: Diagnosis not present

## 2017-08-22 DIAGNOSIS — N189 Chronic kidney disease, unspecified: Secondary | ICD-10-CM | POA: Insufficient documentation

## 2017-08-22 DIAGNOSIS — R531 Weakness: Secondary | ICD-10-CM

## 2017-08-22 DIAGNOSIS — Z87891 Personal history of nicotine dependence: Secondary | ICD-10-CM | POA: Diagnosis not present

## 2017-08-22 DIAGNOSIS — M25552 Pain in left hip: Secondary | ICD-10-CM

## 2017-08-22 DIAGNOSIS — Z79899 Other long term (current) drug therapy: Secondary | ICD-10-CM | POA: Insufficient documentation

## 2017-08-22 DIAGNOSIS — Z7982 Long term (current) use of aspirin: Secondary | ICD-10-CM | POA: Diagnosis not present

## 2017-08-22 DIAGNOSIS — W19XXXA Unspecified fall, initial encounter: Secondary | ICD-10-CM

## 2017-08-22 LAB — CBC
HCT: 32.5 % — ABNORMAL LOW (ref 36.0–46.0)
Hemoglobin: 11 g/dL — ABNORMAL LOW (ref 12.0–15.0)
MCH: 31.8 pg (ref 26.0–34.0)
MCHC: 33.8 g/dL (ref 30.0–36.0)
MCV: 93.9 fL (ref 78.0–100.0)
Platelets: 363 10*3/uL (ref 150–400)
RBC: 3.46 MIL/uL — ABNORMAL LOW (ref 3.87–5.11)
RDW: 11.9 % (ref 11.5–15.5)
WBC: 4.7 10*3/uL (ref 4.0–10.5)

## 2017-08-22 LAB — BASIC METABOLIC PANEL
Anion gap: 11 (ref 5–15)
BUN: 20 mg/dL (ref 6–20)
CO2: 26 mmol/L (ref 22–32)
Calcium: 10.1 mg/dL (ref 8.9–10.3)
Chloride: 100 mmol/L — ABNORMAL LOW (ref 101–111)
Creatinine, Ser: 1.23 mg/dL — ABNORMAL HIGH (ref 0.44–1.00)
GFR calc Af Amer: 46 mL/min — ABNORMAL LOW (ref >60)
GFR calc non Af Amer: 39 mL/min — ABNORMAL LOW (ref >60)
Glucose, Bld: 109 mg/dL — ABNORMAL HIGH (ref 65–99)
Potassium: 4.5 mmol/L (ref 3.5–5.1)
Sodium: 137 mmol/L (ref 135–145)

## 2017-08-22 LAB — URINALYSIS, ROUTINE W REFLEX MICROSCOPIC
Bilirubin Urine: NEGATIVE
Glucose, UA: NEGATIVE mg/dL
Hgb urine dipstick: NEGATIVE
Ketones, ur: NEGATIVE mg/dL
Nitrite: NEGATIVE
Protein, ur: NEGATIVE mg/dL
Specific Gravity, Urine: 1.023 (ref 1.005–1.030)
pH: 6 (ref 5.0–8.0)

## 2017-08-22 LAB — I-STAT TROPONIN, ED: Troponin i, poc: 0 ng/mL (ref 0.00–0.08)

## 2017-08-22 MED ORDER — ACETAMINOPHEN 500 MG PO TABS
1000.0000 mg | ORAL_TABLET | Freq: Once | ORAL | Status: AC
  Administered 2017-08-22: 1000 mg via ORAL
  Filled 2017-08-22: qty 2

## 2017-08-22 NOTE — ED Notes (Signed)
Waiting on pt to provide urine. Pt and patient family ask me to give patient water and  to wait fifty min and she will be able to provide urine sample. Patient does not want to be cath at this time.

## 2017-08-22 NOTE — ED Notes (Signed)
Patient states that she can not provide sample at this time.

## 2017-08-22 NOTE — ED Provider Notes (Signed)
[facility] DEPT Provider Note   CSN: [id number] Arrival date & time: 08/22/17  8676     History   Chief Complaint Chief Complaint  Patient presents with  . Fall  . Hip Pain  . Shoulder Pain    HPI [name] is a 84 y.o. female.  HPI Had unwitnessed fall at nursing facility.  It is suspected that she tripped while using her walker to go to the bathroom.  Patient is alert and interactive.  She is deaf.  Her son acts as Astronomer.  She reports she did have some pain on the left side of her body.  At this time no severe or localizing pain.  Her son reports she does have troubles with general weakness.  Her appetite has been decreasing.  He reports at baseline she gets up and ambulates around with her walker.  There have been no recent changes in terms of fevers, cough, complaints of chest pain, shortness of breath, vomiting or diarrhea.  Reports due to failure to thrive type issues he is working with the nursing home for alternative care measures. Past Medical History:  Diagnosis Date  . Alcohol abuse   . Anemia   . Cataract   . CKD (chronic kidney disease)   . Deaf   . Dementia   . Diabetes mellitus   . Dry eyes   . Hypercholesteremia   . Hyperlipidemia   . Hypertension     There are no active problems to display for this patient.   Past Surgical History:  Procedure Laterality Date  . CATARACT EXTRACTION W/ INTRAOCULAR LENS IMPLANT     left eye  . EYE SURGERY    . PARS PLANA VITRECTOMY Left 05/04/2017   Procedure: PARS PLANA VITRECTOMY LEFT EYE WITH 25 GAUGE WITH ENDOLASER;  Surgeon: Jalene Mullet, MD;  Location: [facility];  Service: Ophthalmology;  Laterality: Left;     OB History   None      Home Medications    Prior to Admission medications   Medication Sig Start Date End Date Taking? Authorizing Provider  aspirin 81 MG chewable tablet Chew 81 mg by mouth daily. (0700)   Yes [provider]  brimonidine-timolol  (COMBIGAN) 0.2-0.5 % ophthalmic solution Place 1 drop into the left eye 2 (two) times daily. (Separate from other eye drops by at least 10 minutes)   Yes [provider]  carvedilol (COREG) 25 MG tablet Take 25 mg by mouth 2 (two) times daily. (0700 & 1900)   Yes [provider]  cloNIDine (CATAPRES) 0.3 MG tablet Take 0.3 mg by mouth daily. (0700)   Yes [provider]  docusate sodium (COLACE) 100 MG capsule Take 100 mg by mouth daily. (0700)   Yes [provider]  donepezil (ARICEPT) 10 MG tablet Take 10 mg by mouth at bedtime. (1900)   Yes [provider]  doxazosin (CARDURA) 8 MG tablet Take 16 mg by mouth at bedtime. (1900)   Yes [provider]  escitalopram (LEXAPRO) 10 MG tablet Take 10 mg by mouth daily. (0700)   Yes [provider]  ferrous sulfate 325 (65 FE) MG tablet Take 325 mg by mouth daily. (0700)   Yes [provider]  hydroxypropyl methylcellulose / hypromellose (ISOPTO TEARS / GONIOVISC) 2.5 % ophthalmic solution Place 1 drop into both eyes 4 (four) times daily. (0800, 1200, 1600, & 2000)   Yes [provider]  losartan (COZAAR) 100 MG tablet Take 100 mg  by mouth daily. (0700)   Yes [provider]  lovastatin (MEVACOR) 40 MG tablet Take 40 mg by mouth at bedtime. (1900)   Yes [provider]  metFORMIN (GLUCOPHAGE) 1000 MG tablet Take 1,000 mg by mouth 2 (two) times daily with a meal. (0700 & 1500)   Yes [provider]  traZODone (DESYREL) 50 MG tablet Take 50 mg by mouth at bedtime. (1900)    Yes [provider]  Dextrose, Diabetic Use, (INSTA-GLUCOSE PO) Take 1 Dose by mouth daily as needed (for blood sugar less than 70--recheck in 15 minutes.).    [provider]    Family History No family history on file.  Social History Social History   Tobacco Use  . Smoking status: Former Research scientist (life sciences)  . Smokeless tobacco: Never Used  Substance Use Topics  .  Alcohol use: Yes  . Drug use: No     Allergies   Patient has no known allergies.   Review of Systems Review of Systems 10 Systems reviewed and are negative for acute change except as noted in the HPI.  Physical Exam Updated Vital Signs BP (!) 154/91 (BP Location: Right Arm)   Pulse (!) 56   Temp 98.1 F (36.7 C) (Oral)   Resp 18   SpO2 100%   Physical Exam  Constitutional:  Patient is deaf.  She is however alert.  She interacts for examination.  No respiratory distress.  HENT:  Head: Normocephalic and atraumatic.  Right Ear: External ear normal.  Left Ear: External ear normal.  Nose: Nose normal.  Mouth/Throat: Oropharynx is clear and moist.  Eyes: Pupils are equal, round, and reactive to light. EOM are normal.  Neck: Neck supple.  No C-spine tenderness.  Cardiovascular: Normal rate, regular rhythm, normal heart sounds and intact distal pulses.  Pulmonary/Chest: Effort normal and breath sounds normal. She exhibits no tenderness.  Abdominal: Soft. She exhibits no distension. There is no tenderness. There is no guarding.  Musculoskeletal: Normal range of motion. She exhibits no deformity.  May also put both upper and lower extremities through range of motion without expression of pain.  No evident deformities contusions or abrasions.  No peripheral edema.  Neurological: She is alert. She exhibits normal muscle tone. Coordination normal.  No focal motor deficits.  Skin:  Skin warm and dry.  Patient has generally pale appearance.  Psychiatric: She has a normal mood and affect.      ED Treatments / Results  Labs (all labs ordered are listed, but only abnormal results are displayed) Labs Reviewed  BASIC METABOLIC PANEL - Abnormal; Notable for the following components:      Result Value   Chloride 100 (*)    Glucose, Bld 109 (*)    Creatinine, Ser 1.23 (*)    GFR calc non Af Amer 39 (*)    GFR calc Af Amer 46 (*)    All other components within normal limits  CBC -  Abnormal; Notable for the following components:   RBC 3.46 (*)    Hemoglobin 11.0 (*)    HCT 32.5 (*)    All other components within normal limits  URINALYSIS, ROUTINE W REFLEX MICROSCOPIC - Abnormal; Notable for the following components:   APPearance HAZY (*)    Leukocytes, UA SMALL (*)    Bacteria, UA RARE (*)    All other components within normal limits  I-STAT TROPONIN, ED    EKG None  Radiology Dg Chest 2 View  Result Date: 08/22/2017 CLINICAL  DATA:  [place] today, former smoking history, history of diabetes EXAM: CHEST - 2 VIEW COMPARISON:  Chest x-ray of 02/10/2017 FINDINGS: No active infiltrate or effusion is seen. Mediastinal and hilar contours are unremarkable. Mild cardiomegaly is stable. There are significant degenerative changes in the shoulders. The bones appear somewhat osteopenic. No acute compression of the thoracic vertebrae is noted. IMPRESSION: 1. No active lung disease. 2. Stable mild cardiomegaly. 3. Degenerative joint disease involves both shoulders. Electronically Signed   By: Ivar Drape M.D.   On: 08/22/2017 10:21   Dg Hip Unilat With Pelvis 2-3 Views Left  Result Date: 08/22/2017 CLINICAL DATA:  Left hip pain.  Fall EXAM: DG HIP (WITH OR WITHOUT PELVIS) 2-3V LEFT COMPARISON:  None. FINDINGS: There is no evidence of hip fracture or dislocation. There is no evidence of arthropathy or other focal bone abnormality. IMPRESSION: Negative. Electronically Signed   By: Franchot Gallo M.D.   On: 08/22/2017 09:52    Procedures Procedures (including critical care time)  Medications Ordered in ED Medications  acetaminophen (TYLENOL) tablet 1,000 mg (1,000 mg Oral Given 08/22/17 1027)     Initial Impression / Assessment and Plan / ED Course  I have reviewed the triage vital signs and the nursing notes.  Pertinent labs & imaging results that were available during my care of the patient were reviewed by me and considered in my medical decision making (see chart for  details).     Final Clinical Impressions(s) / ED Diagnoses   Final diagnoses:  Fall, initial encounter  Generalized weakness   Diagnostic work-up within normal limits.  No evidence of acute significant injury.  Patient is functional at baseline.  At this time she is stable for return to nursing home care. ED Discharge Orders    None       Charlesetta Shanks, MD 08/22/17 [phone]

## 2017-08-22 NOTE — ED Triage Notes (Addendum)
Per [facility] pt from [facility] for fall tripping over her feet and c/o left hip pain and left shoulder pain. Was witnessed by staff. Pt is deaf.  Using interpretor pt states that she got dizzy and fell.

## 2017-10-25 ENCOUNTER — Encounter (HOSPITAL_COMMUNITY): Payer: Self-pay | Admitting: Emergency Medicine

## 2017-10-25 ENCOUNTER — Emergency Department (HOSPITAL_COMMUNITY)
Admission: EM | Admit: 2017-10-25 | Discharge: 2017-10-25 | Disposition: A | Discharge status: Home / Self Care | Payer: Medicare Other | Attending: Emergency Medicine | Admitting: Emergency Medicine

## 2017-10-25 ENCOUNTER — Other Ambulatory Visit: Payer: Self-pay

## 2017-10-25 ENCOUNTER — Emergency Department (HOSPITAL_COMMUNITY): Payer: Medicare Other

## 2017-10-25 DIAGNOSIS — Z79899 Other long term (current) drug therapy: Secondary | ICD-10-CM | POA: Diagnosis not present

## 2017-10-25 DIAGNOSIS — E860 Dehydration: Secondary | ICD-10-CM | POA: Insufficient documentation

## 2017-10-25 DIAGNOSIS — Z7984 Long term (current) use of oral hypoglycemic drugs: Secondary | ICD-10-CM | POA: Insufficient documentation

## 2017-10-25 DIAGNOSIS — E1122 Type 2 diabetes mellitus with diabetic chronic kidney disease: Secondary | ICD-10-CM | POA: Insufficient documentation

## 2017-10-25 DIAGNOSIS — Z87891 Personal history of nicotine dependence: Secondary | ICD-10-CM | POA: Diagnosis not present

## 2017-10-25 DIAGNOSIS — D649 Anemia, unspecified: Secondary | ICD-10-CM | POA: Diagnosis not present

## 2017-10-25 DIAGNOSIS — H9190 Unspecified hearing loss, unspecified ear: Secondary | ICD-10-CM | POA: Insufficient documentation

## 2017-10-25 DIAGNOSIS — Z7982 Long term (current) use of aspirin: Secondary | ICD-10-CM | POA: Diagnosis not present

## 2017-10-25 DIAGNOSIS — N189 Chronic kidney disease, unspecified: Secondary | ICD-10-CM | POA: Diagnosis not present

## 2017-10-25 DIAGNOSIS — F0390 Unspecified dementia without behavioral disturbance: Secondary | ICD-10-CM | POA: Insufficient documentation

## 2017-10-25 DIAGNOSIS — I129 Hypertensive chronic kidney disease with stage 1 through stage 4 chronic kidney disease, or unspecified chronic kidney disease: Secondary | ICD-10-CM | POA: Diagnosis not present

## 2017-10-25 DIAGNOSIS — R55 Syncope and collapse: Secondary | ICD-10-CM | POA: Diagnosis present

## 2017-10-25 LAB — CBC WITH DIFFERENTIAL/PLATELET
Abs Immature Granulocytes: 0 10*3/uL (ref 0.0–0.1)
Basophils Absolute: 0 10*3/uL (ref 0.0–0.1)
Basophils Relative: 1 %
Eosinophils Absolute: 0.1 10*3/uL (ref 0.0–0.7)
Eosinophils Relative: 1 %
HCT: 29.6 % — ABNORMAL LOW (ref 36.0–46.0)
Hemoglobin: 9.4 g/dL — ABNORMAL LOW (ref 12.0–15.0)
Immature Granulocytes: 0 %
Lymphocytes Relative: 44 %
Lymphs Abs: 1.9 10*3/uL (ref 0.7–4.0)
MCH: 31.1 pg (ref 26.0–34.0)
MCHC: 31.8 g/dL (ref 30.0–36.0)
MCV: 98 fL (ref 78.0–100.0)
Monocytes Absolute: 0.3 10*3/uL (ref 0.1–1.0)
Monocytes Relative: 8 %
Neutro Abs: 1.9 10*3/uL (ref 1.7–7.7)
Neutrophils Relative %: 46 %
Platelets: 252 10*3/uL (ref 150–400)
RBC: 3.02 MIL/uL — ABNORMAL LOW (ref 3.87–5.11)
RDW: 12.7 % (ref 11.5–15.5)
WBC: 4.2 10*3/uL (ref 4.0–10.5)

## 2017-10-25 LAB — COMPREHENSIVE METABOLIC PANEL
ALT: 8 U/L (ref 0–44)
AST: 15 U/L (ref 15–41)
Albumin: 3.3 g/dL — ABNORMAL LOW (ref 3.5–5.0)
Alkaline Phosphatase: 47 U/L (ref 38–126)
Anion gap: 9 (ref 5–15)
BUN: 24 mg/dL — ABNORMAL HIGH (ref 8–23)
CO2: 23 mmol/L (ref 22–32)
Calcium: 9.4 mg/dL (ref 8.9–10.3)
Chloride: 106 mmol/L (ref 98–111)
Creatinine, Ser: 1.47 mg/dL — ABNORMAL HIGH (ref 0.44–1.00)
GFR calc Af Amer: 37 mL/min — ABNORMAL LOW (ref >60)
GFR calc non Af Amer: 32 mL/min — ABNORMAL LOW (ref >60)
Glucose, Bld: 119 mg/dL — ABNORMAL HIGH (ref 70–99)
Potassium: 4.1 mmol/L (ref 3.5–5.1)
Sodium: 138 mmol/L (ref 135–145)
Total Bilirubin: 0.6 mg/dL (ref 0.3–1.2)
Total Protein: 5.7 g/dL — ABNORMAL LOW (ref 6.5–8.1)

## 2017-10-25 LAB — I-STAT TROPONIN, ED
Troponin i, poc: 0 ng/mL (ref 0.00–0.08)
Troponin i, poc: 0 ng/mL (ref 0.00–0.08)

## 2017-10-25 LAB — POC OCCULT BLOOD, ED: Fecal Occult Bld: NEGATIVE

## 2017-10-25 MED ORDER — SODIUM CHLORIDE 0.9 % IV BOLUS
500.0000 mL | Freq: Once | INTRAVENOUS | Status: AC
  Administered 2017-10-25: 500 mL via INTRAVENOUS

## 2017-10-25 NOTE — Discharge Instructions (Addendum)
There is evidence of some minor dehydration on the lab work.  Please be sure to drink water regularly to stay hydrated.  Please also be sure to have regular, small meals. There was some decrease in your hemoglobin, also called anemia.  Many times, this is due to nutritional deficiencies. Please follow up with the primary care provider on these matters. Return to the ED as needed.

## 2017-10-25 NOTE — ED Provider Notes (Signed)
[facility] EMERGENCY DEPARTMENT Provider Note   CSN: [id number] Arrival date & time: 10/25/17  4008     History   Chief Complaint Chief Complaint  Patient presents with  . Loss of Consciousness    HPI [name] is a 84 y.o. female.  The history is limited by a language barrier. A language interpreter was used (Attempted to use sign language interpreter, but patient did not seem to understand well and was not giving complete answers. ).     Level 5 caveat due to dementia.  [name] is a 84 y.o. female, with a history of dementia, CKD, DM, HTN, hyperlipidemia, deafness, presenting to the ED with reported syncopal episode.  Patient arrives from nursing home where she was reportedly sitting at table and then had a syncopal episode while seated. EMS reports somnolence on their arrival. Staff reports to EMS that patient has had recent syncopal episodes.  Patient reportedly nonverbal at baseline. Initially hypotensive on scene at 80/58, received 500 cc NS bolus prior to ED arrival.  CBG 134. Staff told EMS patient was back to her baseline prior to departure.  When asked about pain, patient points to her chest but then points all over her body.   Past Medical History:  Diagnosis Date  . Alcohol abuse   . Anemia   . Cataract   . CKD (chronic kidney disease)   . Deaf   . Dementia   . Diabetes mellitus   . Dry eyes   . Hypercholesteremia   . Hyperlipidemia   . Hypertension     There are no active problems to display for this patient.   Past Surgical History:  Procedure Laterality Date  . CATARACT EXTRACTION W/ INTRAOCULAR LENS IMPLANT     left eye  . EYE SURGERY    . PARS PLANA VITRECTOMY Left 05/04/2017   Procedure: PARS PLANA VITRECTOMY LEFT EYE WITH 25 GAUGE WITH ENDOLASER;  Surgeon: Jalene Mullet, MD;  Location: [facility];  Service: Ophthalmology;  Laterality: Left;     OB History   None      Home Medications    Prior to  Admission medications   Medication Sig Start Date End Date Taking? Authorizing Provider  aspirin 81 MG chewable tablet Chew 81 mg by mouth daily. (0700)   Yes [provider]  brimonidine (ALPHAGAN) 0.2 % ophthalmic solution Place 1 drop into the right eye 2 (two) times daily.   Yes [provider]  brimonidine-timolol (COMBIGAN) 0.2-0.5 % ophthalmic solution Place 1 drop into the left eye 2 (two) times daily. (Separate from other eye drops by at least 10 minutes)    Yes [provider]  carvedilol (COREG) 25 MG tablet Take 25 mg by mouth 2 (two) times daily. (0700 & 1900)   Yes [provider]  cloNIDine (CATAPRES) 0.3 MG tablet Take 0.3 mg by mouth daily. (0700)   Yes [provider]  cyclopentolate (CYCLODRYL,CYCLOGYL) 1 % ophthalmic solution Place 1 drop into the right eye 3 (three) times daily as needed (starting 24hf before surgery).   Yes [provider]  Dextrose, Diabetic Use, (INSTA-GLUCOSE PO) Take 1 Dose by mouth daily as needed (for blood sugar less than 70--recheck in 15 minutes.).   Yes [provider]  docusate sodium (COLACE) 100 MG capsule Take 100 mg by mouth daily. (0700)   Yes [provider]  donepezil (ARICEPT) 10 MG tablet Take 10 mg by mouth at bedtime. (1900)  Yes [provider]  doxazosin (CARDURA) 8 MG tablet Take 16 mg by mouth at bedtime. (1900)   Yes [provider]  escitalopram (LEXAPRO) 10 MG tablet Take 10 mg by mouth daily. (0700)   Yes [provider]  ferrous sulfate 325 (65 FE) MG tablet Take 325 mg by mouth daily. (0700)   Yes [provider]  hydroxypropyl methylcellulose / hypromellose (ISOPTO TEARS / GONIOVISC) 2.5 % ophthalmic solution Place 1 drop into both eyes 4 (four) times daily. (0800, 1200, 1600, & 2000)   Yes [provider]  ketorolac (ACULAR) 0.4 % SOLN Place 1 drop into the right eye 4 (four) times daily. Starting 72 hours before  surgery   Yes [provider]  losartan (COZAAR) 100 MG tablet Take 100 mg by mouth daily. (0700)   Yes [provider]  lovastatin (MEVACOR) 20 MG tablet Take 20 mg by mouth at bedtime. (1900)   Yes [provider]  metFORMIN (GLUCOPHAGE) 1000 MG tablet Take 1,000 mg by mouth 2 (two) times daily with a meal. (0700 & 1500)   Yes [provider]  mirtazapine (REMERON) 7.5 MG tablet Take 7.5 mg by mouth at bedtime.   Yes [provider]  nepafenac (ILEVRO) 0.3 % ophthalmic suspension Place 1 drop into the left eye every morning.   Yes [provider]  ofloxacin (OCUFLOX) 0.3 % ophthalmic solution Place 1 drop into both eyes 4 (four) times daily. Starting 72hrs prior to surgery   Yes [provider]  prednisoLONE acetate (PRED FORTE) 1 % ophthalmic suspension Place 1 drop into the right eye 4 (four) times daily.   Yes [provider]  traZODone (DESYREL) 50 MG tablet Take 50 mg by mouth at bedtime. (1900)    Yes [provider]    Family History History reviewed. No pertinent family history.  Social History Social History   Tobacco Use  . Smoking status: Former Research scientist (life sciences)  . Smokeless tobacco: Never Used  Substance Use Topics  . Alcohol use: Yes  . Drug use: No     Allergies   Patient has no known allergies.   Review of Systems Review of Systems  Unable to perform ROS: Dementia     Physical Exam Updated Vital Signs BP 120/72 (BP Location: Right Arm)   Pulse 60   Temp (!) 97.3 F (36.3 C) (Oral)   Resp 16   SpO2 98%   Physical Exam  Constitutional: She appears well-developed and well-nourished. No distress.  HENT:  Head: Normocephalic and atraumatic.  Eyes: Conjunctivae are normal.  Neck: Neck supple.  Cardiovascular: Normal rate, regular rhythm, normal heart sounds and intact distal pulses.  Pulmonary/Chest: Effort normal and breath sounds normal. No respiratory distress. She exhibits no  tenderness.  Abdominal: Soft. There is no tenderness. There is no guarding.  Genitourinary: Rectal exam shows guaiac negative stool.  Genitourinary Comments: No external hemorrhoids, fissures, or lesions noted. No gross blood, melena, or stool burden. No rectal tenderness. No foreign bodies noted. RN, Burman Nieves, served as chaperone during the rectal exam.  Musculoskeletal: She exhibits no edema.  Lymphadenopathy:    She has no cervical adenopathy.  Neurological:  Patient intermittently somnolent, but can be roused with light tactile stimulus.  She appears to indicate that she has sensation grossly intact to light touch in the extremities. She has motor function intact in each of the extremities. She will follow some simple commands.  Skin: Skin is warm and dry. She is not  diaphoretic.  Psychiatric: She has a normal mood and affect. Her behavior is normal.  Nursing note and vitals reviewed.    ED Treatments / Results  Labs (all labs ordered are listed, but only abnormal results are displayed) Labs Reviewed  COMPREHENSIVE METABOLIC PANEL - Abnormal; Notable for the following components:      Result Value   Glucose, Bld 119 (*)    BUN 24 (*)    Creatinine, Ser 1.47 (*)    Total Protein 5.7 (*)    Albumin 3.3 (*)    GFR calc non Af Amer 32 (*)    GFR calc Af Amer 37 (*)    All other components within normal limits  CBC WITH DIFFERENTIAL/PLATELET - Abnormal; Notable for the following components:   RBC 3.02 (*)    Hemoglobin 9.4 (*)    HCT 29.6 (*)    All other components within normal limits  URINALYSIS, ROUTINE W REFLEX MICROSCOPIC  RAPID URINE DRUG SCREEN, HOSP PERFORMED  I-STAT TROPONIN, ED  POC OCCULT BLOOD, ED  I-STAT TROPONIN, ED   Hemoglobin  Date Value Ref Range Status  10/25/2017 9.4 (L) 12.0 - 15.0 g/dL Final  08/22/2017 11.0 (L) 12.0 - 15.0 g/dL Final  05/04/2017 10.5 (L) 12.0 - 15.0 g/dL Final  02/10/2017 10.8 (L) 12.0 - 15.0 g/dL Final   BUN  Date Value Ref  Range Status  10/25/2017 24 (H) 8 - 23 mg/dL Final    Comment:    Please note change in reference range.  08/22/2017 20 6 - 20 mg/dL Final  05/04/2017 20 6 - 20 mg/dL Final  02/10/2017 16 6 - 20 mg/dL Final   Creatinine, Ser  Date Value Ref Range Status  10/25/2017 1.47 (H) 0.44 - 1.00 mg/dL Final  08/22/2017 1.23 (H) 0.44 - 1.00 mg/dL Final  05/04/2017 1.18 (H) 0.44 - 1.00 mg/dL Final  02/10/2017 1.16 (H) 0.44 - 1.00 mg/dL Final     EKG EKG Interpretation  Date/Time:  Wednesday October 25 2017 09:31:08 EDT Ventricular Rate:  58 PR Interval:    QRS Duration: 96 QT Interval:  398 QTC Calculation: 391 R Axis:   21 Text Interpretation:  Sinus rhythm Normal ECG No significant change since last tracing Confirmed by Blanchie Dessert [phone]) on 10/25/2017 9:36:37 AM Also confirmed by Blanchie Dessert [phone]), editor Lynder Parents [phone])  on 10/25/2017 3:05:08 PM   Radiology Dg Chest 2 View  Result Date: 10/25/2017 CLINICAL DATA:  Chest pain and syncope. EXAM: CHEST - 2 VIEW COMPARISON:  Chest x-ray dated August 22, 2017. FINDINGS: The patient is rotated to the right. Stable mild cardiomegaly. Normal pulmonary vascularity. No focal consolidation, pleural effusion, or pneumothorax. No acute osseous abnormality. IMPRESSION: No active cardiopulmonary disease. Electronically Signed   By: Titus Dubin M.D.   On: 10/25/2017 10:12   Ct Head Wo Contrast  Result Date: 10/25/2017 CLINICAL DATA:  Syncope.  History of dementia EXAM: CT HEAD WITHOUT CONTRAST TECHNIQUE: Contiguous axial images were obtained from the base of the skull through the vertex without intravenous contrast. COMPARISON:  February 10, 2017 FINDINGS: Brain: The ventricles are normal in size and configuration. There is no intracranial mass, hemorrhage, extra-axial fluid collection, or midline shift. There is patchy small vessel disease in the centra semiovale bilaterally. Prior lacunar infarcts are noted in the anterior lentiform  nucleus on the left as well as in the anterior limbs of each external capsule. Small vessel disease is also noted in the anterior limb of the left internal  capsule, stable. No new gray-white compartment lesions are evident. No evident acute infarct. Vascular: No evident hyperdense vessel. There is calcification in each carotid siphon region. Skull: Bony calvarium appears intact. Sinuses/Orbits: There is mucosal thickening in the right maxillary antrum. There is mucosal thickening in several ethmoid air cells. Other paranasal sinuses are clear. Orbits appear symmetric bilaterally except for cataract removal on left. Other: Mastoid air cells are clear. IMPRESSION: Patchy periventricular small vessel disease. Prior small lacunar infarcts in the basal ganglia regions, stable. No acute infarct evident. No mass or hemorrhage. There are foci of arterial vascular calcification. There are areas of paranasal sinus disease. Electronically Signed   By: Lowella Grip III M.D.   On: 10/25/2017 11:19    Procedures Procedures (including critical care time)  Medications Ordered in ED Medications  sodium chloride 0.9 % bolus 500 mL (0 mLs Intravenous Stopped 10/25/17 1308)     Initial Impression / Assessment and Plan / ED Course  I have reviewed the triage vital signs and the nursing notes.  Pertinent labs & imaging results that were available during my care of the patient were reviewed by me and considered in my medical decision making (see chart for details).  Clinical Course as of Oct 26 1602  Wed Oct 25, 2017  1135 Spoke with patient's sons, Ollen Gross ([place]) and Bland Span.  They confirmed that the patient is at the baseline she has been at for several months.  They state she has been less vibrant and has had decreased appetite over the past several months. We discussed the possibility that this may very well be from her dementia, but further evaluation by her PCP is warranted.   [SJ]    Clinical Course User  Index [SJ] Shenia Alan C, PA-C    Patient presents with reported syncopal episode.  Initially hypotensive with EMS, however, patient's mental status and blood pressure improved with IV fluids.  Appropriate to her baseline during ED course. Given further hydration here in the ED. Some change in the patient's hemoglobin from 11.4 two months ago to 9.4 today.  Hemoccult negative.  Patient's sons are pleased with the patient's work-up and are comfortable with discharge.    Findings and plan of care discussed with Blanchie Dessert, MD. Dr. Maryan Rued personally evaluated and examined this patient.  Vitals:   10/25/17 0933 10/25/17 0941 10/25/17 0945 10/25/17 1000  BP: 120/72  105/74 116/79  Pulse: 60 (!) 58 (!) 59 (!) 53  Resp: 16 12 16 15   Temp: (!) 97.3 F (36.3 C)     TempSrc: Oral     SpO2: 98% 95% 97% 99%   Vitals:   10/25/17 1230 10/25/17 1300 10/25/17 1330 10/25/17 1400  BP: (!) 157/87 (!) 141/81 (!) 153/86 (!) 155/87  Pulse: (!) 55 (!) 55 (!) 54 70  Resp: 13 12 12 12   Temp:      TempSrc:      SpO2: 100% 98% 99% 99%     Final Clinical Impressions(s) / ED Diagnoses   Final diagnoses:  Dehydration  Anemia, unspecified type    ED Discharge Orders    None       Layla Maw 10/25/17 1605    Blanchie Dessert, MD 10/26/17 0715

## 2017-10-25 NOTE — ED Notes (Signed)
Patient transported to CT 

## 2017-10-25 NOTE — ED Triage Notes (Addendum)
Pt coming via EMS. Pt from a nursing facility. Possible syncopal episode. Pt not responsive for a few minutes per staff at nursing facility. EMS reports pt having a few PVCs on heart monitor with pauses. EMS said staff reports "she has been going out on [place] a lot lately". Pt is deaf. Pt has hx of dementia as well. spo2 96% on room air, BP 80/58 initially, then 100/64. EMS gave 585mL NS bolus. Prior to leaving, pt was at her mental baseline.

## 2018-03-18 ENCOUNTER — Emergency Department (HOSPITAL_COMMUNITY): Payer: Medicare Other

## 2018-03-18 ENCOUNTER — Emergency Department (HOSPITAL_COMMUNITY)
Admission: EM | Admit: 2018-03-18 | Discharge: 2018-03-18 | Disposition: A | Discharge status: Home / Self Care | Payer: Medicare Other | Attending: Emergency Medicine | Admitting: Emergency Medicine

## 2018-03-18 DIAGNOSIS — Z7984 Long term (current) use of oral hypoglycemic drugs: Secondary | ICD-10-CM | POA: Insufficient documentation

## 2018-03-18 DIAGNOSIS — F0390 Unspecified dementia without behavioral disturbance: Secondary | ICD-10-CM | POA: Insufficient documentation

## 2018-03-18 DIAGNOSIS — N189 Chronic kidney disease, unspecified: Secondary | ICD-10-CM | POA: Insufficient documentation

## 2018-03-18 DIAGNOSIS — E860 Dehydration: Secondary | ICD-10-CM

## 2018-03-18 DIAGNOSIS — Z87891 Personal history of nicotine dependence: Secondary | ICD-10-CM | POA: Insufficient documentation

## 2018-03-18 DIAGNOSIS — R55 Syncope and collapse: Secondary | ICD-10-CM | POA: Diagnosis present

## 2018-03-18 DIAGNOSIS — E1122 Type 2 diabetes mellitus with diabetic chronic kidney disease: Secondary | ICD-10-CM | POA: Diagnosis not present

## 2018-03-18 DIAGNOSIS — Z7982 Long term (current) use of aspirin: Secondary | ICD-10-CM | POA: Diagnosis not present

## 2018-03-18 DIAGNOSIS — I129 Hypertensive chronic kidney disease with stage 1 through stage 4 chronic kidney disease, or unspecified chronic kidney disease: Secondary | ICD-10-CM | POA: Diagnosis not present

## 2018-03-18 DIAGNOSIS — Z79899 Other long term (current) drug therapy: Secondary | ICD-10-CM | POA: Diagnosis not present

## 2018-03-18 LAB — COMPREHENSIVE METABOLIC PANEL
ALT: 9 U/L (ref 0–44)
AST: 14 U/L — ABNORMAL LOW (ref 15–41)
Albumin: 3.7 g/dL (ref 3.5–5.0)
Alkaline Phosphatase: 55 U/L (ref 38–126)
Anion gap: 8 (ref 5–15)
BUN: 26 mg/dL — ABNORMAL HIGH (ref 8–23)
CO2: 29 mmol/L (ref 22–32)
Calcium: 9.8 mg/dL (ref 8.9–10.3)
Chloride: 104 mmol/L (ref 98–111)
Creatinine, Ser: 1.26 mg/dL — ABNORMAL HIGH (ref 0.44–1.00)
GFR calc Af Amer: 44 mL/min — ABNORMAL LOW (ref >60)
GFR calc non Af Amer: 38 mL/min — ABNORMAL LOW (ref >60)
Glucose, Bld: 109 mg/dL — ABNORMAL HIGH (ref 70–99)
Potassium: 4.1 mmol/L (ref 3.5–5.1)
Sodium: 141 mmol/L (ref 135–145)
Total Bilirubin: 0.7 mg/dL (ref 0.3–1.2)
Total Protein: 6.5 g/dL (ref 6.5–8.1)

## 2018-03-18 LAB — CBC WITH DIFFERENTIAL/PLATELET
Abs Immature Granulocytes: 0.02 10*3/uL (ref 0.00–0.07)
Basophils Absolute: 0 10*3/uL (ref 0.0–0.1)
Basophils Relative: 1 %
Eosinophils Absolute: 0.1 10*3/uL (ref 0.0–0.5)
Eosinophils Relative: 1 %
HCT: 30.7 % — ABNORMAL LOW (ref 36.0–46.0)
Hemoglobin: 9.8 g/dL — ABNORMAL LOW (ref 12.0–15.0)
Immature Granulocytes: 0 %
Lymphocytes Relative: 34 %
Lymphs Abs: 1.9 10*3/uL (ref 0.7–4.0)
MCH: 31.5 pg (ref 26.0–34.0)
MCHC: 31.9 g/dL (ref 30.0–36.0)
MCV: 98.7 fL (ref 80.0–100.0)
Monocytes Absolute: 0.5 10*3/uL (ref 0.1–1.0)
Monocytes Relative: 9 %
Neutro Abs: 3.1 10*3/uL (ref 1.7–7.7)
Neutrophils Relative %: 55 %
Platelets: 289 10*3/uL (ref 150–400)
RBC: 3.11 MIL/uL — ABNORMAL LOW (ref 3.87–5.11)
RDW: 13 % (ref 11.5–15.5)
WBC: 5.7 10*3/uL (ref 4.0–10.5)
nRBC: 0 % (ref 0.0–0.2)

## 2018-03-18 LAB — APTT: aPTT: 25 seconds (ref 24–36)

## 2018-03-18 LAB — PROTIME-INR
INR: 1.08
Prothrombin Time: 13.9 seconds (ref 11.4–15.2)

## 2018-03-18 LAB — I-STAT TROPONIN, ED: Troponin i, poc: 0 ng/mL (ref 0.00–0.08)

## 2018-03-18 LAB — LIPASE, BLOOD: Lipase: 50 U/L (ref 11–51)

## 2018-03-18 MED ORDER — SODIUM CHLORIDE 0.9 % IV BOLUS
500.0000 mL | Freq: Once | INTRAVENOUS | Status: AC
  Administered 2018-03-18: 500 mL via INTRAVENOUS

## 2018-03-18 MED ORDER — SODIUM CHLORIDE 0.9 % IV SOLN
INTRAVENOUS | Status: DC
  Administered 2018-03-18: 09:00:00 via INTRAVENOUS

## 2018-03-18 MED ORDER — SODIUM CHLORIDE 0.9 % IV SOLN
INTRAVENOUS | Status: DC
  Administered 2018-03-18: 10:00:00 via INTRAVENOUS

## 2018-03-18 NOTE — ED Triage Notes (Signed)
Pt BIB EMS from [facility].  PT was eating breakfast and staff reported she "had a near syncopal episode."  No fall, no LOC. EMS arrived and pt had one emesis occurrence.  Staff reports pt AOx3.  Pt deaf.  Pt has complaints of nausea.  EMS gave 4 mg Zofran.  Pt also reports minor headache.    VS 190/102; HR-60, T-97.1; CBG 137; R-16.    18 g IV L AC.

## 2018-03-18 NOTE — ED Notes (Signed)
Family at bedside. 

## 2018-03-18 NOTE — ED Notes (Signed)
Bed: WA06 Expected date:  Expected time:  Means of arrival:  Comments: Near syncopal

## 2018-03-18 NOTE — ED Provider Notes (Signed)
[facility] DEPT Provider Note   CSN: [id number] Arrival date & time: 03/18/18  0813     History   Chief Complaint No chief complaint on file.   HPI [name] is a 84 y.o. female.  83 year old female with a history of dementia as well as hearing impairment who has limited sign language ability presents from nursing home after having a near syncopal event while eating her breakfast.  No reported aspiration but patient did have emesis x1.  Patient had noted some nausea and was given Zofran.  Had complained of minor headache.  History is limited due to her current state.  When I inquired if she had any pain in her chest or abdomen she said no.  She currently denies any severe headache.     Past Medical History:  Diagnosis Date  . Alcohol abuse   . Anemia   . Cataract   . CKD (chronic kidney disease)   . Deaf   . Dementia   . Diabetes mellitus   . Dry eyes   . Hypercholesteremia   . Hyperlipidemia   . Hypertension     There are no active problems to display for this patient.   Past Surgical History:  Procedure Laterality Date  . CATARACT EXTRACTION W/ INTRAOCULAR LENS IMPLANT     left eye  . EYE SURGERY    . PARS PLANA VITRECTOMY Left 05/04/2017   Procedure: PARS PLANA VITRECTOMY LEFT EYE WITH 25 GAUGE WITH ENDOLASER;  Surgeon: Jalene Mullet, MD;  Location: [facility];  Service: Ophthalmology;  Laterality: Left;     OB History   None      Home Medications    Prior to Admission medications   Medication Sig Start Date End Date Taking? Authorizing Provider  aspirin 81 MG chewable tablet Chew 81 mg by mouth daily. (0700)    [provider]  brimonidine (ALPHAGAN) 0.2 % ophthalmic solution Place 1 drop into the right eye 2 (two) times daily.    [provider]  brimonidine-timolol (COMBIGAN) 0.2-0.5 % ophthalmic solution Place 1 drop into the left eye 2 (two) times daily. (Separate from other eye drops by at least  10 minutes)     [provider]  carvedilol (COREG) 25 MG tablet Take 25 mg by mouth 2 (two) times daily. (0700 & 1900)    [provider]  cloNIDine (CATAPRES) 0.3 MG tablet Take 0.3 mg by mouth daily. (0700)    [provider]  cyclopentolate (CYCLODRYL,CYCLOGYL) 1 % ophthalmic solution Place 1 drop into the right eye 3 (three) times daily as needed (starting 24hf before surgery).    [provider]  Dextrose, Diabetic Use, (INSTA-GLUCOSE PO) Take 1 Dose by mouth daily as needed (for blood sugar less than 70--recheck in 15 minutes.).    [provider]  docusate sodium (COLACE) 100 MG capsule Take 100 mg by mouth daily. (0700)    [provider]  donepezil (ARICEPT) 10 MG tablet Take 10 mg by mouth at bedtime. (1900)    [provider]  doxazosin (CARDURA) 8 MG tablet Take 16 mg by mouth at bedtime. (1900)    [provider]  escitalopram (LEXAPRO) 10 MG tablet Take 10 mg by mouth daily. (0700)    [provider]  ferrous sulfate 325 (65 FE) MG tablet Take 325 mg by mouth daily. (0700)    [provider]  hydroxypropyl methylcellulose / hypromellose (ISOPTO TEARS / GONIOVISC) 2.5 % ophthalmic solution  Place 1 drop into both eyes 4 (four) times daily. (0800, 1200, 1600, & 2000)    [provider]  ketorolac (ACULAR) 0.4 % SOLN Place 1 drop into the right eye 4 (four) times daily. Starting 72 hours before surgery    [provider]  losartan (COZAAR) 100 MG tablet Take 100 mg by mouth daily. (0700)    [provider]  lovastatin (MEVACOR) 20 MG tablet Take 20 mg by mouth at bedtime. (1900)    [provider]  metFORMIN (GLUCOPHAGE) 1000 MG tablet Take 1,000 mg by mouth 2 (two) times daily with a meal. (0700 & 1500)    [provider]  mirtazapine (REMERON) 7.5 MG tablet Take 7.5 mg by mouth at bedtime.    [provider]  nepafenac (ILEVRO) 0.3 % ophthalmic  suspension Place 1 drop into the left eye every morning.    [provider]  ofloxacin (OCUFLOX) 0.3 % ophthalmic solution Place 1 drop into both eyes 4 (four) times daily. Starting 72hrs prior to surgery    [provider]  prednisoLONE acetate (PRED FORTE) 1 % ophthalmic suspension Place 1 drop into the right eye 4 (four) times daily.    [provider]  traZODone (DESYREL) 50 MG tablet Take 50 mg by mouth at bedtime. (1900)     [provider]    Family History No family history on file.  Social History Social History   Tobacco Use  . Smoking status: Former Research scientist (life sciences)  . Smokeless tobacco: Never Used  Substance Use Topics  . Alcohol use: Yes  . Drug use: No     Allergies   Patient has no known allergies.   Review of Systems Review of Systems  Unable to perform ROS: Dementia     Physical Exam Updated Vital Signs BP (!) 195/117 (BP Location: Right Arm)   Pulse (!) 55   Temp 97.6 F (36.4 C) (Oral)   Resp 18   SpO2 100%   Physical Exam  Constitutional: She appears well-developed and well-nourished.  Non-toxic appearance. No distress.  HENT:  Head: Normocephalic and atraumatic.  Eyes: Pupils are equal, round, and reactive to light. Conjunctivae, EOM and lids are normal.  Neck: Normal range of motion. Neck supple. No tracheal deviation present. No thyroid mass present.  Cardiovascular: Normal rate, regular rhythm and normal heart sounds. Exam reveals no gallop.  No murmur heard. Pulmonary/Chest: Effort normal and breath sounds normal. No stridor. No respiratory distress. She has no decreased breath sounds. She has no wheezes. She has no rhonchi. She has no rales.  Abdominal: Soft. Normal appearance and bowel sounds are normal. She exhibits no distension. There is no tenderness. There is no rebound and no CVA tenderness.  Musculoskeletal: Normal range of motion. She exhibits no edema or tenderness.  Neurological: She is alert. She has  normal strength. No cranial nerve deficit or sensory deficit. GCS eye subscore is 4. GCS verbal subscore is 2. GCS motor subscore is 6.  Skin: Skin is warm and dry. No abrasion and no rash noted.  Psychiatric: Her affect is blunt. She is slowed.  Nursing note and vitals reviewed.    ED Treatments / Results  Labs (all labs ordered are listed, but only abnormal results are displayed) Labs Reviewed  CBC WITH DIFFERENTIAL/PLATELET  COMPREHENSIVE METABOLIC PANEL  PROTIME-INR  APTT  LIPASE, BLOOD  I-STAT TROPONIN, ED    EKG EKG Interpretation  Date/Time:  Sunday March 18 2018 08:53:29 EST Ventricular Rate:  54 PR Interval:    QRS Duration: 97 QT Interval:  421 QTC Calculation: 399 R Axis:   9 Text Interpretation:  Sinus rhythm No significant change since last tracing Confirmed by Lacretia Leigh ([id number]) on 03/18/2018 9:40:14 AM   Radiology No results found.  Procedures Procedures (including critical care time)  Medications Ordered in ED Medications  0.9 %  sodium chloride infusion (has no administration in time range)     Initial Impression / Assessment and Plan / ED Course  I have reviewed the triage vital signs and the nursing notes.  Pertinent labs & imaging results that were available during my care of the patient were reviewed by me and considered in my medical decision making (see chart for details).     Patient was mildly orthostatic here.  Given IV hydration and now so somewhat back to her baseline.  Case discussed with her son who states that she has had these type of episodes before in the past associate with dehydration.  Patient stable for discharge back to her facility  Final Clinical Impressions(s) / ED Diagnoses   Final diagnoses:  None    ED Discharge Orders    None       Lacretia Leigh, MD 03/18/18 1106

## 2018-03-18 NOTE — ED Notes (Signed)
Discharge paperwork reviewed with pts son, who stated he would like to transport patient back to nursing facility.

## 2019-01-06 IMAGING — CT CT HEAD W/O CM
4 series · 16 of 47 positions shown, 18 images · non-contrast
Comparison: Head CT May 24, 2016 and brain MRI August 25, 2016

CLINICAL DATA: Syncope with altered mental status. History of
dementia

EXAM:
CT HEAD WITHOUT CONTRAST
TECHNIQUE: Contiguous axial images were obtained from the base of the skull
through the vertex without intravenous contrast.

[Series 3: head without · axial · non-contrast · 0.42mm/px · z∈[-116,-1]mm · 7 of 31 slices shown, 9 images]
[im 4/31  brain]
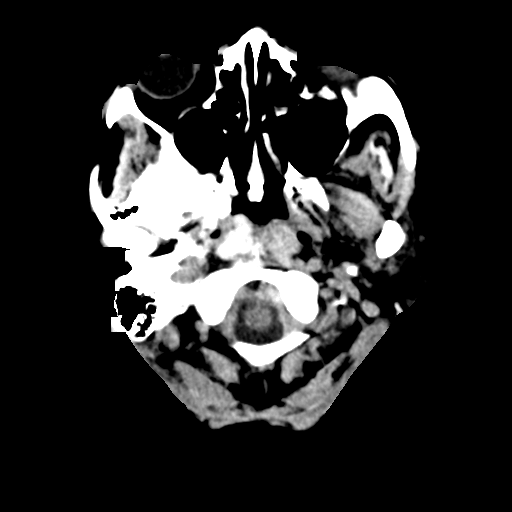
[im 4/31  bone]
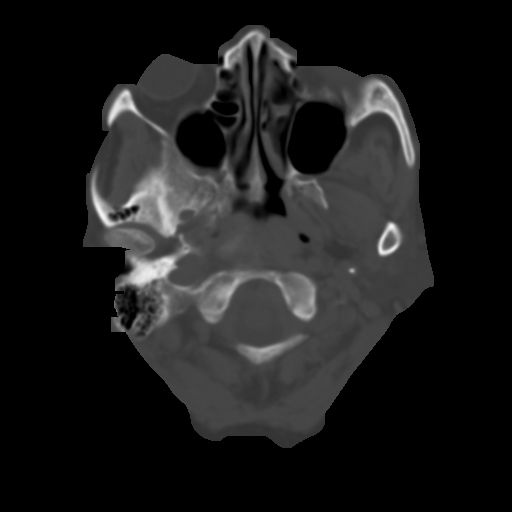
[im 8/31  brain]
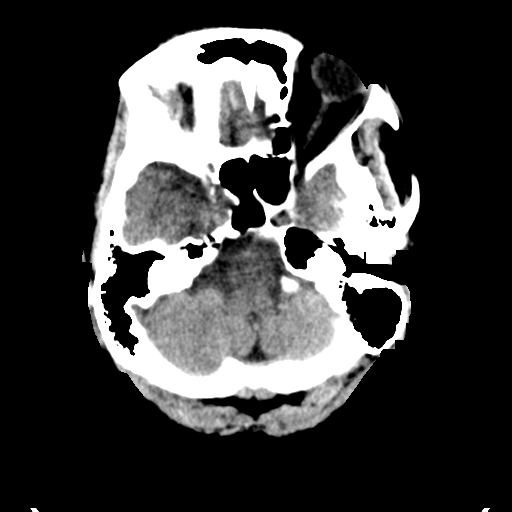
[im 12/31  brain]
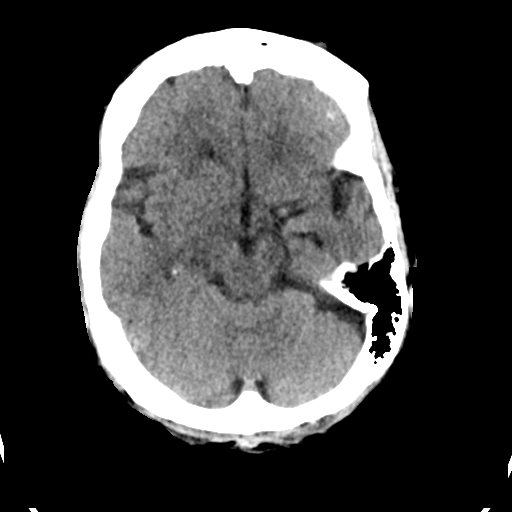
[im 16/31  brain]
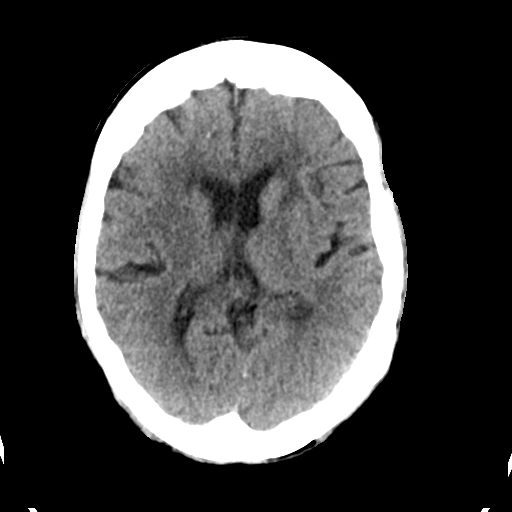
[im 19/31  brain]
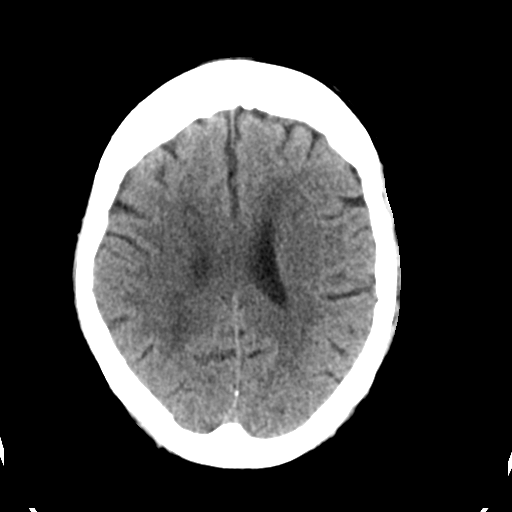
[im 19/31  bone]
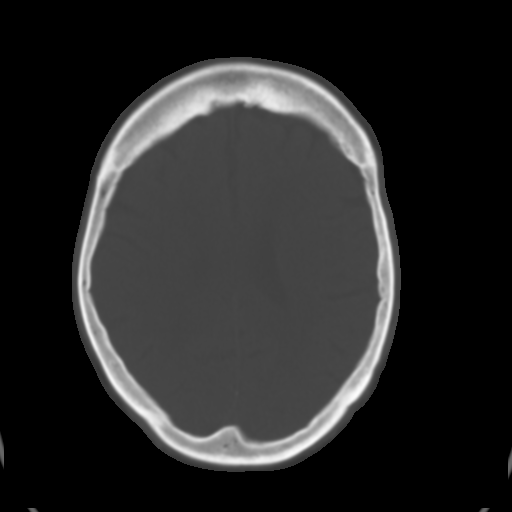
[im 23/31  brain]
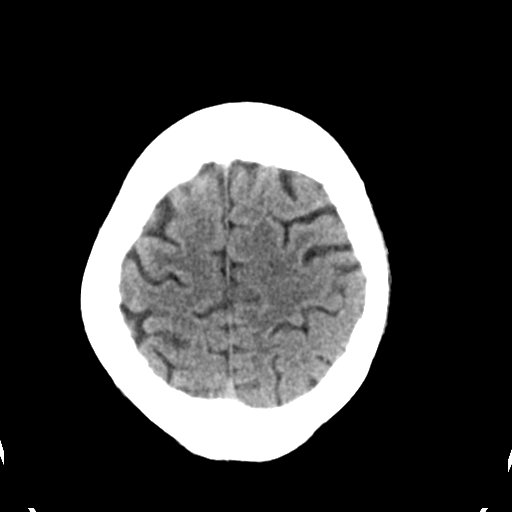
[im 27/31  brain]
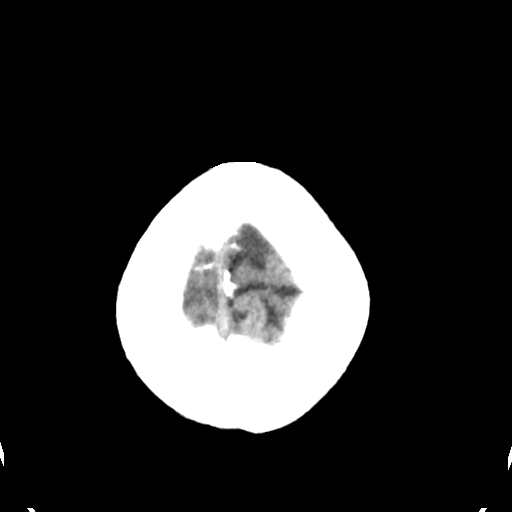

[Series 4: head bone · axial · 0.42mm/px · z∈[-117,-87]mm · 3 of 76 slices shown]
[im 8/76  bone]
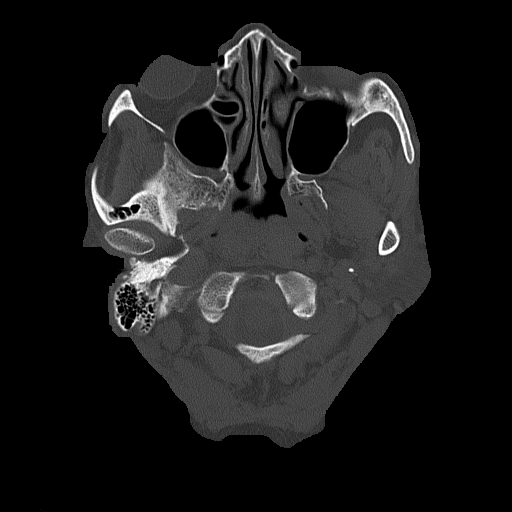
[im 16/76  bone]
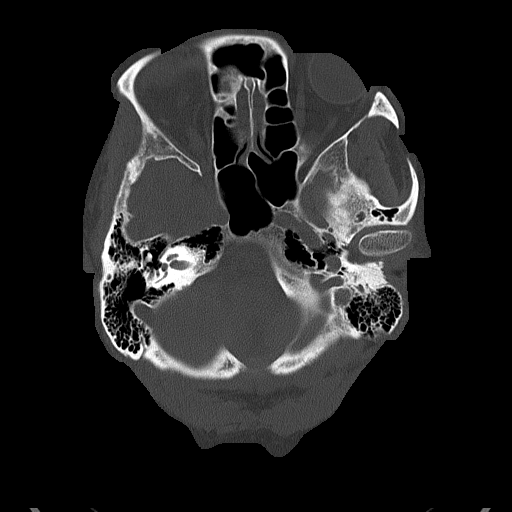
[im 23/76  bone]
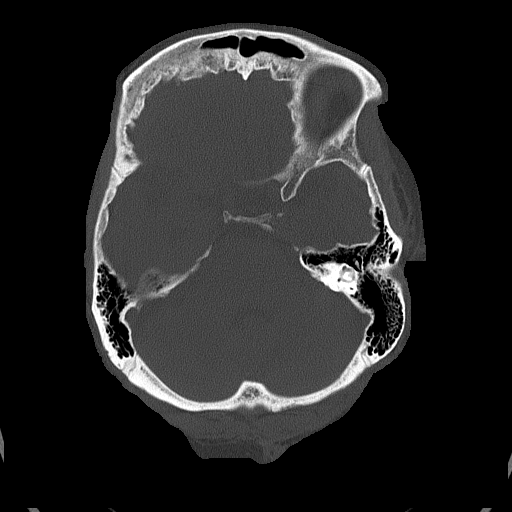

[Series 5: head without cor · coronal · non-contrast · 0.29mm/px · 3 of 66 slices shown]
[im 22/66  brain]
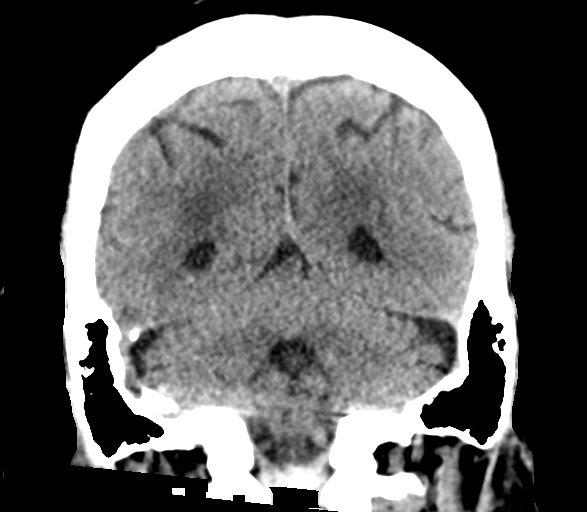
[im 29/66  brain]
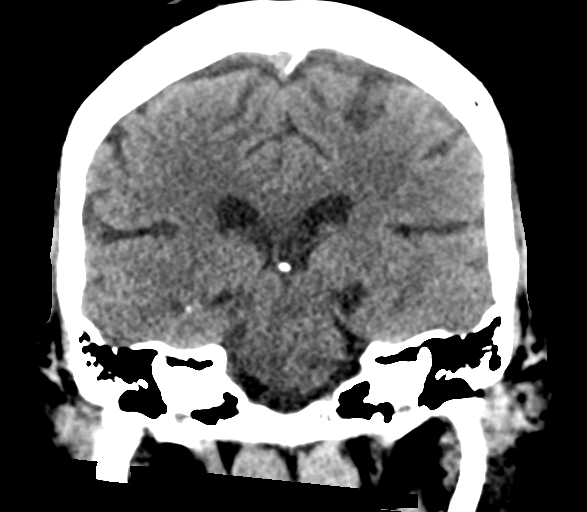
[im 37/66  brain]
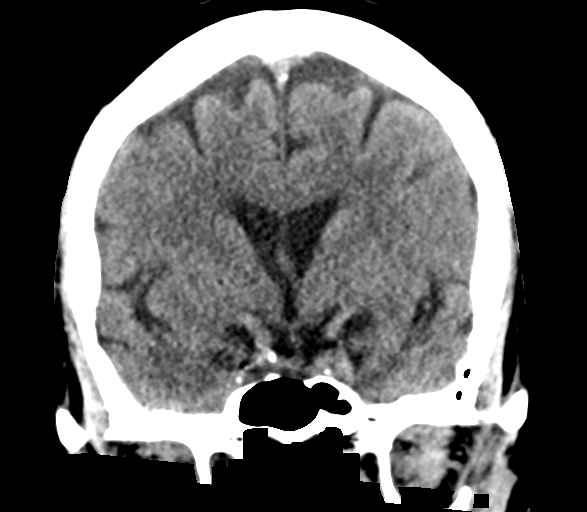

[Series 6: head without sag · sagittal · non-contrast · 0.29mm/px · 3 of 52 slices shown]
[im 18/52  brain]
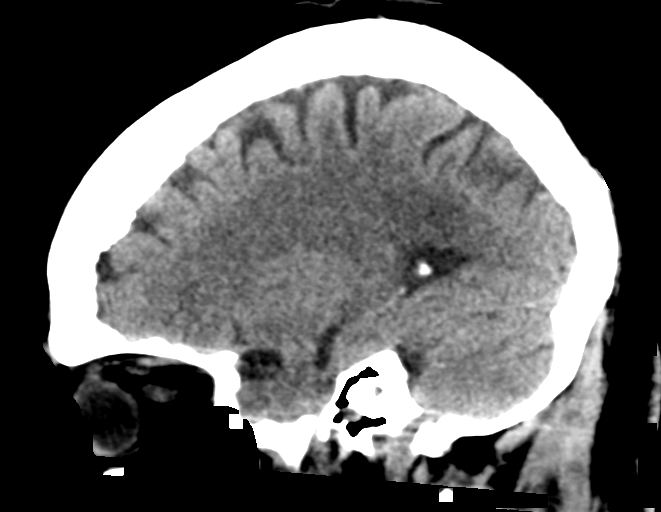
[im 26/52  brain]
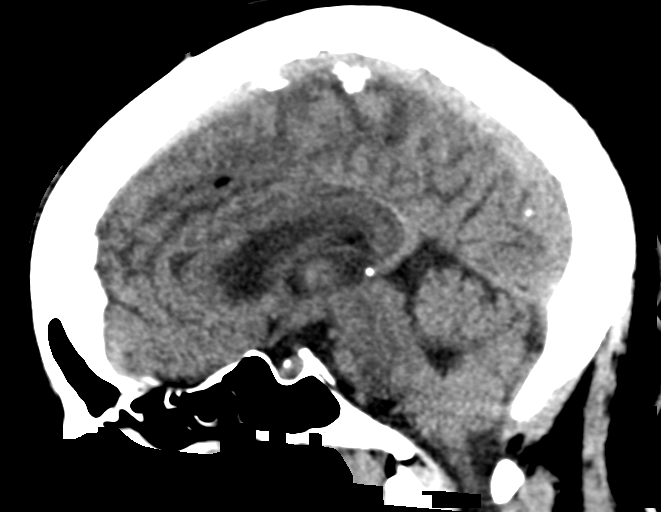
[im 35/52  brain]
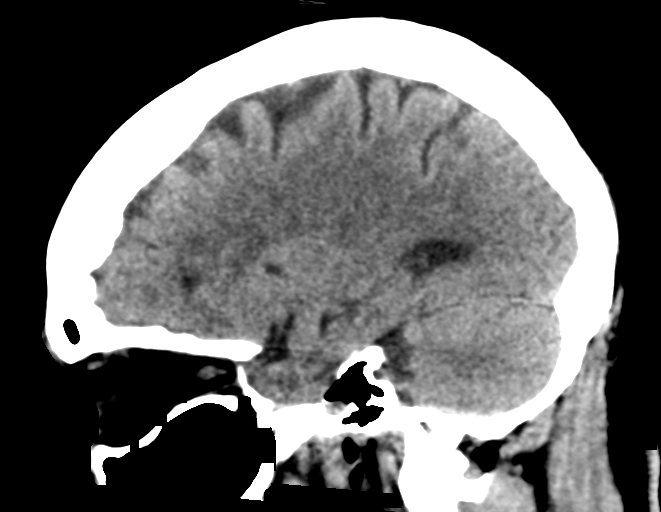

[16 of 47 positions shown; findings below may reference images not displayed]

FINDINGS: Brain: There is age related volume loss. There is no intracranial
mass hemorrhage, extra-axial fluid collection, or midline shift.
There is patchy small vessel disease throughout the centra semiovale
bilaterally. There are scattered lacunar type infarcts in the left
internal and external capsules as well as a small infarct in the
anterior left lentiform nucleus. There is also small vessel disease
in the anterior limb of the right external and internal capsules.
There is no new gray-white compartment lesion. No acute infarct
evident.

Vascular: There is no appreciable hyperdense vessel. There are foci
of calcification in each carotid artery.

Skull: Bony calvarium appears intact.

Sinuses/Orbits: There is mild mucosal thickening in the right
maxillary antrum. There is mucosal thickening in several ethmoid air
cells bilaterally. Other visualized paranasal sinuses are clear.
Orbits appear symmetric bilaterally.

Other: Mastoid air cells are clear.
IMPRESSION: Age related volume loss with supratentorial small vessel disease,
stable. Basal ganglia lacunar type infarcts, more on the left than
on the right, are stable. No acute infarct is demonstrable.

There are foci of arterial vascular calcification. There are areas
of paranasal sinus disease.

## 2019-04-29 DIAGNOSIS — U071 COVID-19: Secondary | ICD-10-CM | POA: Diagnosis not present

## 2019-04-29 DIAGNOSIS — M6281 Muscle weakness (generalized): Secondary | ICD-10-CM | POA: Diagnosis not present

## 2019-05-06 DIAGNOSIS — Z008 Encounter for other general examination: Secondary | ICD-10-CM | POA: Diagnosis not present

## 2019-05-07 DIAGNOSIS — R1312 Dysphagia, oropharyngeal phase: Secondary | ICD-10-CM | POA: Diagnosis not present

## 2019-05-07 DIAGNOSIS — H913 Deaf nonspeaking, not elsewhere classified: Secondary | ICD-10-CM | POA: Diagnosis not present

## 2019-05-07 DIAGNOSIS — U071 COVID-19: Secondary | ICD-10-CM | POA: Diagnosis not present

## 2019-05-08 DIAGNOSIS — U071 COVID-19: Secondary | ICD-10-CM | POA: Diagnosis not present

## 2019-05-08 DIAGNOSIS — H913 Deaf nonspeaking, not elsewhere classified: Secondary | ICD-10-CM | POA: Diagnosis not present

## 2019-05-08 DIAGNOSIS — R1312 Dysphagia, oropharyngeal phase: Secondary | ICD-10-CM | POA: Diagnosis not present

## 2019-05-10 DIAGNOSIS — R1312 Dysphagia, oropharyngeal phase: Secondary | ICD-10-CM | POA: Diagnosis not present

## 2019-05-10 DIAGNOSIS — U071 COVID-19: Secondary | ICD-10-CM | POA: Diagnosis not present

## 2019-05-10 DIAGNOSIS — H913 Deaf nonspeaking, not elsewhere classified: Secondary | ICD-10-CM | POA: Diagnosis not present

## 2019-05-13 DIAGNOSIS — H913 Deaf nonspeaking, not elsewhere classified: Secondary | ICD-10-CM | POA: Diagnosis not present

## 2019-05-13 DIAGNOSIS — R1312 Dysphagia, oropharyngeal phase: Secondary | ICD-10-CM | POA: Diagnosis not present

## 2019-05-13 DIAGNOSIS — U071 COVID-19: Secondary | ICD-10-CM | POA: Diagnosis not present

## 2019-05-15 DIAGNOSIS — H913 Deaf nonspeaking, not elsewhere classified: Secondary | ICD-10-CM | POA: Diagnosis not present

## 2019-05-15 DIAGNOSIS — U071 COVID-19: Secondary | ICD-10-CM | POA: Diagnosis not present

## 2019-05-15 DIAGNOSIS — R1312 Dysphagia, oropharyngeal phase: Secondary | ICD-10-CM | POA: Diagnosis not present

## 2019-05-16 DIAGNOSIS — F339 Major depressive disorder, recurrent, unspecified: Secondary | ICD-10-CM | POA: Diagnosis not present

## 2019-05-16 DIAGNOSIS — G4701 Insomnia due to medical condition: Secondary | ICD-10-CM | POA: Diagnosis not present

## 2019-05-20 DIAGNOSIS — H913 Deaf nonspeaking, not elsewhere classified: Secondary | ICD-10-CM | POA: Diagnosis not present

## 2019-05-20 DIAGNOSIS — U071 COVID-19: Secondary | ICD-10-CM | POA: Diagnosis not present

## 2019-05-20 DIAGNOSIS — R1312 Dysphagia, oropharyngeal phase: Secondary | ICD-10-CM | POA: Diagnosis not present

## 2019-05-21 DIAGNOSIS — H913 Deaf nonspeaking, not elsewhere classified: Secondary | ICD-10-CM | POA: Diagnosis not present

## 2019-05-21 DIAGNOSIS — U071 COVID-19: Secondary | ICD-10-CM | POA: Diagnosis not present

## 2019-05-21 DIAGNOSIS — R1312 Dysphagia, oropharyngeal phase: Secondary | ICD-10-CM | POA: Diagnosis not present

## 2019-05-27 DIAGNOSIS — U071 COVID-19: Secondary | ICD-10-CM | POA: Diagnosis not present

## 2019-05-27 DIAGNOSIS — H913 Deaf nonspeaking, not elsewhere classified: Secondary | ICD-10-CM | POA: Diagnosis not present

## 2019-05-27 DIAGNOSIS — R1312 Dysphagia, oropharyngeal phase: Secondary | ICD-10-CM | POA: Diagnosis not present

## 2019-05-28 DIAGNOSIS — U071 COVID-19: Secondary | ICD-10-CM | POA: Diagnosis not present

## 2019-05-28 DIAGNOSIS — H913 Deaf nonspeaking, not elsewhere classified: Secondary | ICD-10-CM | POA: Diagnosis not present

## 2019-05-28 DIAGNOSIS — R1312 Dysphagia, oropharyngeal phase: Secondary | ICD-10-CM | POA: Diagnosis not present

## 2019-05-29 DIAGNOSIS — R1312 Dysphagia, oropharyngeal phase: Secondary | ICD-10-CM | POA: Diagnosis not present

## 2019-05-29 DIAGNOSIS — H913 Deaf nonspeaking, not elsewhere classified: Secondary | ICD-10-CM | POA: Diagnosis not present

## 2019-05-29 DIAGNOSIS — U071 COVID-19: Secondary | ICD-10-CM | POA: Diagnosis not present

## 2019-06-03 DIAGNOSIS — U071 COVID-19: Secondary | ICD-10-CM | POA: Diagnosis not present

## 2019-06-03 DIAGNOSIS — I10 Essential (primary) hypertension: Secondary | ICD-10-CM | POA: Diagnosis not present

## 2019-06-03 DIAGNOSIS — E119 Type 2 diabetes mellitus without complications: Secondary | ICD-10-CM | POA: Diagnosis not present

## 2019-06-03 DIAGNOSIS — E782 Mixed hyperlipidemia: Secondary | ICD-10-CM | POA: Diagnosis not present

## 2019-06-03 DIAGNOSIS — H913 Deaf nonspeaking, not elsewhere classified: Secondary | ICD-10-CM | POA: Diagnosis not present

## 2019-06-03 DIAGNOSIS — R1312 Dysphagia, oropharyngeal phase: Secondary | ICD-10-CM | POA: Diagnosis not present

## 2019-06-06 DIAGNOSIS — U071 COVID-19: Secondary | ICD-10-CM | POA: Diagnosis not present

## 2019-06-06 DIAGNOSIS — R1312 Dysphagia, oropharyngeal phase: Secondary | ICD-10-CM | POA: Diagnosis not present

## 2019-06-06 DIAGNOSIS — H913 Deaf nonspeaking, not elsewhere classified: Secondary | ICD-10-CM | POA: Diagnosis not present

## 2019-06-13 DIAGNOSIS — G301 Alzheimer's disease with late onset: Secondary | ICD-10-CM | POA: Diagnosis not present

## 2019-06-13 DIAGNOSIS — F339 Major depressive disorder, recurrent, unspecified: Secondary | ICD-10-CM | POA: Diagnosis not present

## 2019-06-13 DIAGNOSIS — G4701 Insomnia due to medical condition: Secondary | ICD-10-CM | POA: Diagnosis not present

## 2019-06-13 DIAGNOSIS — F0151 Vascular dementia with behavioral disturbance: Secondary | ICD-10-CM | POA: Diagnosis not present

## 2019-06-19 DIAGNOSIS — H913 Deaf nonspeaking, not elsewhere classified: Secondary | ICD-10-CM | POA: Diagnosis not present

## 2019-06-19 DIAGNOSIS — R1312 Dysphagia, oropharyngeal phase: Secondary | ICD-10-CM | POA: Diagnosis not present

## 2019-06-19 DIAGNOSIS — U071 COVID-19: Secondary | ICD-10-CM | POA: Diagnosis not present

## 2019-06-24 DIAGNOSIS — E119 Type 2 diabetes mellitus without complications: Secondary | ICD-10-CM | POA: Diagnosis not present

## 2019-06-24 DIAGNOSIS — I10 Essential (primary) hypertension: Secondary | ICD-10-CM | POA: Diagnosis not present

## 2019-06-24 DIAGNOSIS — E782 Mixed hyperlipidemia: Secondary | ICD-10-CM | POA: Diagnosis not present

## 2019-07-11 DIAGNOSIS — G4701 Insomnia due to medical condition: Secondary | ICD-10-CM | POA: Diagnosis not present

## 2019-07-11 DIAGNOSIS — F339 Major depressive disorder, recurrent, unspecified: Secondary | ICD-10-CM | POA: Diagnosis not present

## 2019-07-11 DIAGNOSIS — F0151 Vascular dementia with behavioral disturbance: Secondary | ICD-10-CM | POA: Diagnosis not present

## 2019-07-12 DIAGNOSIS — E119 Type 2 diabetes mellitus without complications: Secondary | ICD-10-CM | POA: Diagnosis not present

## 2019-07-12 DIAGNOSIS — E782 Mixed hyperlipidemia: Secondary | ICD-10-CM | POA: Diagnosis not present

## 2019-07-12 DIAGNOSIS — I10 Essential (primary) hypertension: Secondary | ICD-10-CM | POA: Diagnosis not present

## 2019-07-30 ENCOUNTER — Ambulatory Visit: Payer: Medicare Other | Admitting: Podiatry

## 2019-08-05 DIAGNOSIS — E119 Type 2 diabetes mellitus without complications: Secondary | ICD-10-CM | POA: Diagnosis not present

## 2019-08-05 DIAGNOSIS — D509 Iron deficiency anemia, unspecified: Secondary | ICD-10-CM | POA: Diagnosis not present

## 2019-08-05 DIAGNOSIS — S91309A Unspecified open wound, unspecified foot, initial encounter: Secondary | ICD-10-CM | POA: Diagnosis not present

## 2019-08-09 ENCOUNTER — Ambulatory Visit (INDEPENDENT_AMBULATORY_CARE_PROVIDER_SITE_OTHER): Payer: Medicare Other | Admitting: Podiatry

## 2019-08-09 ENCOUNTER — Other Ambulatory Visit: Payer: Self-pay

## 2019-08-09 VITALS — Temp 98.0°F

## 2019-08-09 DIAGNOSIS — L97511 Non-pressure chronic ulcer of other part of right foot limited to breakdown of skin: Secondary | ICD-10-CM

## 2019-08-09 DIAGNOSIS — L97521 Non-pressure chronic ulcer of other part of left foot limited to breakdown of skin: Secondary | ICD-10-CM

## 2019-08-09 NOTE — Patient Instructions (Signed)
Wounds to the toes do not appear infected.   Please apply neosporin and band-aid to the toes daily.  Apply betadine or cotton in between toes after bathing to keep them dry.  Patient to wear surgical shoes until the toes heal.  Please inform if pus drainage occurs, redness occurs, black discoloration of the toe occurs, or she develop signs of infection.

## 2019-08-14 DIAGNOSIS — M810 Age-related osteoporosis without current pathological fracture: Secondary | ICD-10-CM | POA: Diagnosis not present

## 2019-08-14 DIAGNOSIS — E119 Type 2 diabetes mellitus without complications: Secondary | ICD-10-CM | POA: Diagnosis not present

## 2019-08-15 DIAGNOSIS — F0151 Vascular dementia with behavioral disturbance: Secondary | ICD-10-CM | POA: Diagnosis not present

## 2019-08-15 DIAGNOSIS — F339 Major depressive disorder, recurrent, unspecified: Secondary | ICD-10-CM | POA: Diagnosis not present

## 2019-08-15 DIAGNOSIS — G301 Alzheimer's disease with late onset: Secondary | ICD-10-CM | POA: Diagnosis not present

## 2019-08-15 DIAGNOSIS — G4701 Insomnia due to medical condition: Secondary | ICD-10-CM | POA: Diagnosis not present

## 2019-08-19 DIAGNOSIS — S91309A Unspecified open wound, unspecified foot, initial encounter: Secondary | ICD-10-CM | POA: Diagnosis not present

## 2019-08-21 DIAGNOSIS — R2681 Unsteadiness on feet: Secondary | ICD-10-CM | POA: Diagnosis not present

## 2019-08-21 DIAGNOSIS — L03116 Cellulitis of left lower limb: Secondary | ICD-10-CM | POA: Diagnosis not present

## 2019-08-21 DIAGNOSIS — M6282 Rhabdomyolysis: Secondary | ICD-10-CM | POA: Diagnosis not present

## 2019-08-21 DIAGNOSIS — E11628 Type 2 diabetes mellitus with other skin complications: Secondary | ICD-10-CM | POA: Diagnosis not present

## 2019-08-21 DIAGNOSIS — L03115 Cellulitis of right lower limb: Secondary | ICD-10-CM | POA: Diagnosis not present

## 2019-08-21 DIAGNOSIS — Z8616 Personal history of COVID-19: Secondary | ICD-10-CM | POA: Diagnosis not present

## 2019-08-21 DIAGNOSIS — L89892 Pressure ulcer of other site, stage 2: Secondary | ICD-10-CM | POA: Diagnosis not present

## 2019-08-21 DIAGNOSIS — H913 Deaf nonspeaking, not elsewhere classified: Secondary | ICD-10-CM | POA: Diagnosis not present

## 2019-08-21 DIAGNOSIS — E507 Other ocular manifestations of vitamin A deficiency: Secondary | ICD-10-CM | POA: Diagnosis not present

## 2019-08-21 DIAGNOSIS — I10 Essential (primary) hypertension: Secondary | ICD-10-CM | POA: Diagnosis not present

## 2019-08-21 DIAGNOSIS — Z7984 Long term (current) use of oral hypoglycemic drugs: Secondary | ICD-10-CM | POA: Diagnosis not present

## 2019-08-23 DIAGNOSIS — E782 Mixed hyperlipidemia: Secondary | ICD-10-CM | POA: Diagnosis not present

## 2019-08-23 DIAGNOSIS — E119 Type 2 diabetes mellitus without complications: Secondary | ICD-10-CM | POA: Diagnosis not present

## 2019-08-23 DIAGNOSIS — I10 Essential (primary) hypertension: Secondary | ICD-10-CM | POA: Diagnosis not present

## 2019-08-26 DIAGNOSIS — L03116 Cellulitis of left lower limb: Secondary | ICD-10-CM | POA: Diagnosis not present

## 2019-08-26 DIAGNOSIS — E785 Hyperlipidemia, unspecified: Secondary | ICD-10-CM | POA: Diagnosis not present

## 2019-08-26 DIAGNOSIS — L89892 Pressure ulcer of other site, stage 2: Secondary | ICD-10-CM | POA: Diagnosis not present

## 2019-08-26 DIAGNOSIS — D509 Iron deficiency anemia, unspecified: Secondary | ICD-10-CM | POA: Diagnosis not present

## 2019-08-26 DIAGNOSIS — I10 Essential (primary) hypertension: Secondary | ICD-10-CM | POA: Diagnosis not present

## 2019-08-26 DIAGNOSIS — E11628 Type 2 diabetes mellitus with other skin complications: Secondary | ICD-10-CM | POA: Diagnosis not present

## 2019-08-26 DIAGNOSIS — Z7982 Long term (current) use of aspirin: Secondary | ICD-10-CM | POA: Diagnosis not present

## 2019-08-26 DIAGNOSIS — N183 Chronic kidney disease, stage 3 unspecified: Secondary | ICD-10-CM | POA: Diagnosis not present

## 2019-08-26 DIAGNOSIS — R2689 Other abnormalities of gait and mobility: Secondary | ICD-10-CM | POA: Diagnosis not present

## 2019-08-26 DIAGNOSIS — H913 Deaf nonspeaking, not elsewhere classified: Secondary | ICD-10-CM | POA: Diagnosis not present

## 2019-08-26 DIAGNOSIS — L03115 Cellulitis of right lower limb: Secondary | ICD-10-CM | POA: Diagnosis not present

## 2019-08-27 DIAGNOSIS — Q845 Enlarged and hypertrophic nails: Secondary | ICD-10-CM | POA: Diagnosis not present

## 2019-08-27 DIAGNOSIS — B351 Tinea unguium: Secondary | ICD-10-CM | POA: Diagnosis not present

## 2019-08-27 DIAGNOSIS — I739 Peripheral vascular disease, unspecified: Secondary | ICD-10-CM | POA: Diagnosis not present

## 2019-08-27 DIAGNOSIS — E119 Type 2 diabetes mellitus without complications: Secondary | ICD-10-CM | POA: Diagnosis not present

## 2019-08-29 DIAGNOSIS — H913 Deaf nonspeaking, not elsewhere classified: Secondary | ICD-10-CM | POA: Diagnosis not present

## 2019-08-29 DIAGNOSIS — E11628 Type 2 diabetes mellitus with other skin complications: Secondary | ICD-10-CM | POA: Diagnosis not present

## 2019-08-29 DIAGNOSIS — L89892 Pressure ulcer of other site, stage 2: Secondary | ICD-10-CM | POA: Diagnosis not present

## 2019-08-29 DIAGNOSIS — L03116 Cellulitis of left lower limb: Secondary | ICD-10-CM | POA: Diagnosis not present

## 2019-08-29 DIAGNOSIS — I10 Essential (primary) hypertension: Secondary | ICD-10-CM | POA: Diagnosis not present

## 2019-08-29 DIAGNOSIS — L03115 Cellulitis of right lower limb: Secondary | ICD-10-CM | POA: Diagnosis not present

## 2019-08-30 DIAGNOSIS — H913 Deaf nonspeaking, not elsewhere classified: Secondary | ICD-10-CM | POA: Diagnosis not present

## 2019-08-30 DIAGNOSIS — I10 Essential (primary) hypertension: Secondary | ICD-10-CM | POA: Diagnosis not present

## 2019-08-30 DIAGNOSIS — E11628 Type 2 diabetes mellitus with other skin complications: Secondary | ICD-10-CM | POA: Diagnosis not present

## 2019-08-30 DIAGNOSIS — L03116 Cellulitis of left lower limb: Secondary | ICD-10-CM | POA: Diagnosis not present

## 2019-08-30 DIAGNOSIS — L03115 Cellulitis of right lower limb: Secondary | ICD-10-CM | POA: Diagnosis not present

## 2019-08-30 DIAGNOSIS — L89892 Pressure ulcer of other site, stage 2: Secondary | ICD-10-CM | POA: Diagnosis not present

## 2019-09-02 DIAGNOSIS — H913 Deaf nonspeaking, not elsewhere classified: Secondary | ICD-10-CM | POA: Diagnosis not present

## 2019-09-02 DIAGNOSIS — L89892 Pressure ulcer of other site, stage 2: Secondary | ICD-10-CM | POA: Diagnosis not present

## 2019-09-02 DIAGNOSIS — L03115 Cellulitis of right lower limb: Secondary | ICD-10-CM | POA: Diagnosis not present

## 2019-09-02 DIAGNOSIS — L03116 Cellulitis of left lower limb: Secondary | ICD-10-CM | POA: Diagnosis not present

## 2019-09-02 DIAGNOSIS — E11628 Type 2 diabetes mellitus with other skin complications: Secondary | ICD-10-CM | POA: Diagnosis not present

## 2019-09-02 DIAGNOSIS — I10 Essential (primary) hypertension: Secondary | ICD-10-CM | POA: Diagnosis not present

## 2019-09-03 DIAGNOSIS — L89892 Pressure ulcer of other site, stage 2: Secondary | ICD-10-CM | POA: Diagnosis not present

## 2019-09-03 DIAGNOSIS — E11628 Type 2 diabetes mellitus with other skin complications: Secondary | ICD-10-CM | POA: Diagnosis not present

## 2019-09-03 DIAGNOSIS — H913 Deaf nonspeaking, not elsewhere classified: Secondary | ICD-10-CM | POA: Diagnosis not present

## 2019-09-03 DIAGNOSIS — L03116 Cellulitis of left lower limb: Secondary | ICD-10-CM | POA: Diagnosis not present

## 2019-09-03 DIAGNOSIS — I10 Essential (primary) hypertension: Secondary | ICD-10-CM | POA: Diagnosis not present

## 2019-09-03 DIAGNOSIS — L03115 Cellulitis of right lower limb: Secondary | ICD-10-CM | POA: Diagnosis not present

## 2019-09-04 DIAGNOSIS — E11628 Type 2 diabetes mellitus with other skin complications: Secondary | ICD-10-CM | POA: Diagnosis not present

## 2019-09-04 DIAGNOSIS — L03115 Cellulitis of right lower limb: Secondary | ICD-10-CM | POA: Diagnosis not present

## 2019-09-04 DIAGNOSIS — I10 Essential (primary) hypertension: Secondary | ICD-10-CM | POA: Diagnosis not present

## 2019-09-04 DIAGNOSIS — L03116 Cellulitis of left lower limb: Secondary | ICD-10-CM | POA: Diagnosis not present

## 2019-09-04 DIAGNOSIS — L89892 Pressure ulcer of other site, stage 2: Secondary | ICD-10-CM | POA: Diagnosis not present

## 2019-09-04 DIAGNOSIS — H913 Deaf nonspeaking, not elsewhere classified: Secondary | ICD-10-CM | POA: Diagnosis not present

## 2019-09-06 ENCOUNTER — Ambulatory Visit (INDEPENDENT_AMBULATORY_CARE_PROVIDER_SITE_OTHER): Payer: Medicare Other | Admitting: Podiatry

## 2019-09-06 ENCOUNTER — Other Ambulatory Visit: Payer: Self-pay

## 2019-09-06 DIAGNOSIS — H913 Deaf nonspeaking, not elsewhere classified: Secondary | ICD-10-CM | POA: Diagnosis not present

## 2019-09-06 DIAGNOSIS — E11628 Type 2 diabetes mellitus with other skin complications: Secondary | ICD-10-CM | POA: Diagnosis not present

## 2019-09-06 DIAGNOSIS — L03116 Cellulitis of left lower limb: Secondary | ICD-10-CM | POA: Diagnosis not present

## 2019-09-06 DIAGNOSIS — L89892 Pressure ulcer of other site, stage 2: Secondary | ICD-10-CM | POA: Diagnosis not present

## 2019-09-06 DIAGNOSIS — I10 Essential (primary) hypertension: Secondary | ICD-10-CM | POA: Diagnosis not present

## 2019-09-06 DIAGNOSIS — L97511 Non-pressure chronic ulcer of other part of right foot limited to breakdown of skin: Secondary | ICD-10-CM

## 2019-09-06 DIAGNOSIS — L97521 Non-pressure chronic ulcer of other part of left foot limited to breakdown of skin: Secondary | ICD-10-CM | POA: Diagnosis not present

## 2019-09-06 DIAGNOSIS — L03115 Cellulitis of right lower limb: Secondary | ICD-10-CM | POA: Diagnosis not present

## 2019-09-06 NOTE — Progress Notes (Signed)
  Subjective:  Patient ID: [name], female    DOB: [birth year],  MRN: [id number]  Chief Complaint  Patient presents with  . Wound Check    Bilateral 2nd digit wound check. Pt states much improved, no longer draining pus/blood. Denies fever/chills/nausea/vomiting. No new concerns.    86 y.o. female presents for wound care. Hx confirmed with patient.  Objective:  Physical Exam: Wound Location: bilat 2nd toes Wound Measurement: 0.2x0.2, 0.3x0.2 Wound Base: Granular/Healthy Peri-wound: Calloused Exudate: None: wound tissue dry wound without warmth, erythema, signs of acute infection  Assessment:   1. Ulcer of toe, left, limited to breakdown of skin ([place])   2. Ulcer of toe, right, limited to breakdown of skin [facility])      Plan:  Patient was evaluated and treated and all questions answered.  Ulcer bilat 2nd toes -Minimally debrided -Dressed with abx ointment and band-aid -Continue surgical shoes. -F/u in  6weeks   Return in about 6 weeks (around 10/18/2019) for Ulcer check.

## 2019-09-09 DIAGNOSIS — H913 Deaf nonspeaking, not elsewhere classified: Secondary | ICD-10-CM | POA: Diagnosis not present

## 2019-09-09 DIAGNOSIS — I10 Essential (primary) hypertension: Secondary | ICD-10-CM | POA: Diagnosis not present

## 2019-09-09 DIAGNOSIS — L89892 Pressure ulcer of other site, stage 2: Secondary | ICD-10-CM | POA: Diagnosis not present

## 2019-09-09 DIAGNOSIS — E11628 Type 2 diabetes mellitus with other skin complications: Secondary | ICD-10-CM | POA: Diagnosis not present

## 2019-09-09 DIAGNOSIS — L03116 Cellulitis of left lower limb: Secondary | ICD-10-CM | POA: Diagnosis not present

## 2019-09-09 DIAGNOSIS — L03115 Cellulitis of right lower limb: Secondary | ICD-10-CM | POA: Diagnosis not present

## 2019-09-13 DIAGNOSIS — E11628 Type 2 diabetes mellitus with other skin complications: Secondary | ICD-10-CM | POA: Diagnosis not present

## 2019-09-13 DIAGNOSIS — L03116 Cellulitis of left lower limb: Secondary | ICD-10-CM | POA: Diagnosis not present

## 2019-09-13 DIAGNOSIS — L89892 Pressure ulcer of other site, stage 2: Secondary | ICD-10-CM | POA: Diagnosis not present

## 2019-09-13 DIAGNOSIS — L03115 Cellulitis of right lower limb: Secondary | ICD-10-CM | POA: Diagnosis not present

## 2019-09-13 DIAGNOSIS — I10 Essential (primary) hypertension: Secondary | ICD-10-CM | POA: Diagnosis not present

## 2019-09-13 DIAGNOSIS — H913 Deaf nonspeaking, not elsewhere classified: Secondary | ICD-10-CM | POA: Diagnosis not present

## 2019-09-17 DIAGNOSIS — E11628 Type 2 diabetes mellitus with other skin complications: Secondary | ICD-10-CM | POA: Diagnosis not present

## 2019-09-17 DIAGNOSIS — L89892 Pressure ulcer of other site, stage 2: Secondary | ICD-10-CM | POA: Diagnosis not present

## 2019-09-17 DIAGNOSIS — H913 Deaf nonspeaking, not elsewhere classified: Secondary | ICD-10-CM | POA: Diagnosis not present

## 2019-09-17 DIAGNOSIS — L03116 Cellulitis of left lower limb: Secondary | ICD-10-CM | POA: Diagnosis not present

## 2019-09-17 DIAGNOSIS — I10 Essential (primary) hypertension: Secondary | ICD-10-CM | POA: Diagnosis not present

## 2019-09-17 DIAGNOSIS — L03115 Cellulitis of right lower limb: Secondary | ICD-10-CM | POA: Diagnosis not present

## 2019-09-19 DIAGNOSIS — L89892 Pressure ulcer of other site, stage 2: Secondary | ICD-10-CM | POA: Diagnosis not present

## 2019-09-19 DIAGNOSIS — E11628 Type 2 diabetes mellitus with other skin complications: Secondary | ICD-10-CM | POA: Diagnosis not present

## 2019-09-19 DIAGNOSIS — F339 Major depressive disorder, recurrent, unspecified: Secondary | ICD-10-CM | POA: Diagnosis not present

## 2019-09-19 DIAGNOSIS — L03115 Cellulitis of right lower limb: Secondary | ICD-10-CM | POA: Diagnosis not present

## 2019-09-19 DIAGNOSIS — L03116 Cellulitis of left lower limb: Secondary | ICD-10-CM | POA: Diagnosis not present

## 2019-09-19 DIAGNOSIS — H913 Deaf nonspeaking, not elsewhere classified: Secondary | ICD-10-CM | POA: Diagnosis not present

## 2019-09-19 DIAGNOSIS — I10 Essential (primary) hypertension: Secondary | ICD-10-CM | POA: Diagnosis not present

## 2019-09-19 DIAGNOSIS — F0151 Vascular dementia with behavioral disturbance: Secondary | ICD-10-CM | POA: Diagnosis not present

## 2019-09-19 DIAGNOSIS — G4701 Insomnia due to medical condition: Secondary | ICD-10-CM | POA: Diagnosis not present

## 2019-09-20 DIAGNOSIS — L89892 Pressure ulcer of other site, stage 2: Secondary | ICD-10-CM | POA: Diagnosis not present

## 2019-09-21 DIAGNOSIS — I739 Peripheral vascular disease, unspecified: Secondary | ICD-10-CM | POA: Diagnosis not present

## 2019-09-21 DIAGNOSIS — R32 Unspecified urinary incontinence: Secondary | ICD-10-CM | POA: Diagnosis not present

## 2019-09-21 DIAGNOSIS — Z515 Encounter for palliative care: Secondary | ICD-10-CM | POA: Diagnosis not present

## 2019-09-21 DIAGNOSIS — E119 Type 2 diabetes mellitus without complications: Secondary | ICD-10-CM | POA: Diagnosis not present

## 2019-09-21 DIAGNOSIS — F0280 Dementia in other diseases classified elsewhere without behavioral disturbance: Secondary | ICD-10-CM | POA: Diagnosis not present

## 2019-09-21 DIAGNOSIS — G309 Alzheimer's disease, unspecified: Secondary | ICD-10-CM | POA: Diagnosis not present

## 2019-09-21 DIAGNOSIS — F339 Major depressive disorder, recurrent, unspecified: Secondary | ICD-10-CM | POA: Diagnosis not present

## 2019-09-21 DIAGNOSIS — I10 Essential (primary) hypertension: Secondary | ICD-10-CM | POA: Diagnosis not present

## 2019-09-23 DIAGNOSIS — F0280 Dementia in other diseases classified elsewhere without behavioral disturbance: Secondary | ICD-10-CM | POA: Diagnosis not present

## 2019-09-23 DIAGNOSIS — I10 Essential (primary) hypertension: Secondary | ICD-10-CM | POA: Diagnosis not present

## 2019-09-23 DIAGNOSIS — I739 Peripheral vascular disease, unspecified: Secondary | ICD-10-CM | POA: Diagnosis not present

## 2019-09-23 DIAGNOSIS — G309 Alzheimer's disease, unspecified: Secondary | ICD-10-CM | POA: Diagnosis not present

## 2019-09-23 DIAGNOSIS — E119 Type 2 diabetes mellitus without complications: Secondary | ICD-10-CM | POA: Diagnosis not present

## 2019-09-23 DIAGNOSIS — F339 Major depressive disorder, recurrent, unspecified: Secondary | ICD-10-CM | POA: Diagnosis not present

## 2019-09-24 DIAGNOSIS — I739 Peripheral vascular disease, unspecified: Secondary | ICD-10-CM | POA: Diagnosis not present

## 2019-09-24 DIAGNOSIS — I10 Essential (primary) hypertension: Secondary | ICD-10-CM | POA: Diagnosis not present

## 2019-09-24 DIAGNOSIS — F339 Major depressive disorder, recurrent, unspecified: Secondary | ICD-10-CM | POA: Diagnosis not present

## 2019-09-24 DIAGNOSIS — G311 Senile degeneration of brain, not elsewhere classified: Secondary | ICD-10-CM | POA: Diagnosis not present

## 2019-09-24 DIAGNOSIS — R32 Unspecified urinary incontinence: Secondary | ICD-10-CM | POA: Diagnosis not present

## 2019-09-24 DIAGNOSIS — Z515 Encounter for palliative care: Secondary | ICD-10-CM | POA: Diagnosis not present

## 2019-09-24 DIAGNOSIS — F0280 Dementia in other diseases classified elsewhere without behavioral disturbance: Secondary | ICD-10-CM | POA: Diagnosis not present

## 2019-09-24 DIAGNOSIS — E119 Type 2 diabetes mellitus without complications: Secondary | ICD-10-CM | POA: Diagnosis not present

## 2019-09-25 DIAGNOSIS — I739 Peripheral vascular disease, unspecified: Secondary | ICD-10-CM | POA: Diagnosis not present

## 2019-09-25 DIAGNOSIS — F0280 Dementia in other diseases classified elsewhere without behavioral disturbance: Secondary | ICD-10-CM | POA: Diagnosis not present

## 2019-09-25 DIAGNOSIS — E119 Type 2 diabetes mellitus without complications: Secondary | ICD-10-CM | POA: Diagnosis not present

## 2019-09-25 DIAGNOSIS — F339 Major depressive disorder, recurrent, unspecified: Secondary | ICD-10-CM | POA: Diagnosis not present

## 2019-09-25 DIAGNOSIS — G311 Senile degeneration of brain, not elsewhere classified: Secondary | ICD-10-CM | POA: Diagnosis not present

## 2019-09-25 DIAGNOSIS — I10 Essential (primary) hypertension: Secondary | ICD-10-CM | POA: Diagnosis not present

## 2019-09-27 DIAGNOSIS — E119 Type 2 diabetes mellitus without complications: Secondary | ICD-10-CM | POA: Diagnosis not present

## 2019-09-27 DIAGNOSIS — F339 Major depressive disorder, recurrent, unspecified: Secondary | ICD-10-CM | POA: Diagnosis not present

## 2019-09-27 DIAGNOSIS — I10 Essential (primary) hypertension: Secondary | ICD-10-CM | POA: Diagnosis not present

## 2019-09-27 DIAGNOSIS — I739 Peripheral vascular disease, unspecified: Secondary | ICD-10-CM | POA: Diagnosis not present

## 2019-09-27 DIAGNOSIS — G311 Senile degeneration of brain, not elsewhere classified: Secondary | ICD-10-CM | POA: Diagnosis not present

## 2019-09-27 DIAGNOSIS — F0280 Dementia in other diseases classified elsewhere without behavioral disturbance: Secondary | ICD-10-CM | POA: Diagnosis not present

## 2019-09-30 DIAGNOSIS — I10 Essential (primary) hypertension: Secondary | ICD-10-CM | POA: Diagnosis not present

## 2019-09-30 DIAGNOSIS — D509 Iron deficiency anemia, unspecified: Secondary | ICD-10-CM | POA: Diagnosis not present

## 2019-09-30 DIAGNOSIS — G311 Senile degeneration of brain, not elsewhere classified: Secondary | ICD-10-CM | POA: Diagnosis not present

## 2019-09-30 DIAGNOSIS — E119 Type 2 diabetes mellitus without complications: Secondary | ICD-10-CM | POA: Diagnosis not present

## 2019-09-30 DIAGNOSIS — F339 Major depressive disorder, recurrent, unspecified: Secondary | ICD-10-CM | POA: Diagnosis not present

## 2019-09-30 DIAGNOSIS — F0280 Dementia in other diseases classified elsewhere without behavioral disturbance: Secondary | ICD-10-CM | POA: Diagnosis not present

## 2019-09-30 DIAGNOSIS — I739 Peripheral vascular disease, unspecified: Secondary | ICD-10-CM | POA: Diagnosis not present

## 2019-09-30 DIAGNOSIS — E785 Hyperlipidemia, unspecified: Secondary | ICD-10-CM | POA: Diagnosis not present

## 2019-10-02 DIAGNOSIS — G311 Senile degeneration of brain, not elsewhere classified: Secondary | ICD-10-CM | POA: Diagnosis not present

## 2019-10-02 DIAGNOSIS — I10 Essential (primary) hypertension: Secondary | ICD-10-CM | POA: Diagnosis not present

## 2019-10-02 DIAGNOSIS — F0280 Dementia in other diseases classified elsewhere without behavioral disturbance: Secondary | ICD-10-CM | POA: Diagnosis not present

## 2019-10-02 DIAGNOSIS — E119 Type 2 diabetes mellitus without complications: Secondary | ICD-10-CM | POA: Diagnosis not present

## 2019-10-02 DIAGNOSIS — I739 Peripheral vascular disease, unspecified: Secondary | ICD-10-CM | POA: Diagnosis not present

## 2019-10-02 DIAGNOSIS — F339 Major depressive disorder, recurrent, unspecified: Secondary | ICD-10-CM | POA: Diagnosis not present

## 2019-10-04 DIAGNOSIS — I739 Peripheral vascular disease, unspecified: Secondary | ICD-10-CM | POA: Diagnosis not present

## 2019-10-04 DIAGNOSIS — E119 Type 2 diabetes mellitus without complications: Secondary | ICD-10-CM | POA: Diagnosis not present

## 2019-10-04 DIAGNOSIS — I10 Essential (primary) hypertension: Secondary | ICD-10-CM | POA: Diagnosis not present

## 2019-10-04 DIAGNOSIS — F339 Major depressive disorder, recurrent, unspecified: Secondary | ICD-10-CM | POA: Diagnosis not present

## 2019-10-04 DIAGNOSIS — F0280 Dementia in other diseases classified elsewhere without behavioral disturbance: Secondary | ICD-10-CM | POA: Diagnosis not present

## 2019-10-04 DIAGNOSIS — G311 Senile degeneration of brain, not elsewhere classified: Secondary | ICD-10-CM | POA: Diagnosis not present

## 2019-10-07 ENCOUNTER — Encounter (HOSPITAL_COMMUNITY): Payer: Self-pay | Admitting: Emergency Medicine

## 2019-10-07 ENCOUNTER — Emergency Department (HOSPITAL_COMMUNITY): Payer: Medicare Other

## 2019-10-07 ENCOUNTER — Other Ambulatory Visit: Payer: Self-pay

## 2019-10-07 ENCOUNTER — Inpatient Hospital Stay (HOSPITAL_COMMUNITY)
Admission: EM | Admit: 2019-10-07 | Discharge: 2019-10-21 | DRG: 682 | Disposition: A | Discharge status: Skilled Nursing Facility | Payer: Medicare Other | Source: Skilled Nursing Facility | Attending: Family Medicine | Admitting: Family Medicine

## 2019-10-07 DIAGNOSIS — E119 Type 2 diabetes mellitus without complications: Secondary | ICD-10-CM

## 2019-10-07 DIAGNOSIS — S3993XA Unspecified injury of pelvis, initial encounter: Secondary | ICD-10-CM | POA: Diagnosis not present

## 2019-10-07 DIAGNOSIS — S199XXA Unspecified injury of neck, initial encounter: Secondary | ICD-10-CM | POA: Diagnosis not present

## 2019-10-07 DIAGNOSIS — Z9842 Cataract extraction status, left eye: Secondary | ICD-10-CM

## 2019-10-07 DIAGNOSIS — F0280 Dementia in other diseases classified elsewhere without behavioral disturbance: Secondary | ICD-10-CM | POA: Diagnosis not present

## 2019-10-07 DIAGNOSIS — I129 Hypertensive chronic kidney disease with stage 1 through stage 4 chronic kidney disease, or unspecified chronic kidney disease: Secondary | ICD-10-CM | POA: Diagnosis present

## 2019-10-07 DIAGNOSIS — I739 Peripheral vascular disease, unspecified: Secondary | ICD-10-CM | POA: Diagnosis not present

## 2019-10-07 DIAGNOSIS — Z7984 Long term (current) use of oral hypoglycemic drugs: Secondary | ICD-10-CM

## 2019-10-07 DIAGNOSIS — R4182 Altered mental status, unspecified: Secondary | ICD-10-CM | POA: Diagnosis not present

## 2019-10-07 DIAGNOSIS — D509 Iron deficiency anemia, unspecified: Secondary | ICD-10-CM | POA: Diagnosis present

## 2019-10-07 DIAGNOSIS — L8990 Pressure ulcer of unspecified site, unspecified stage: Secondary | ICD-10-CM | POA: Insufficient documentation

## 2019-10-07 DIAGNOSIS — J939 Pneumothorax, unspecified: Secondary | ICD-10-CM | POA: Diagnosis not present

## 2019-10-07 DIAGNOSIS — Z87891 Personal history of nicotine dependence: Secondary | ICD-10-CM | POA: Diagnosis not present

## 2019-10-07 DIAGNOSIS — E785 Hyperlipidemia, unspecified: Secondary | ICD-10-CM | POA: Diagnosis present

## 2019-10-07 DIAGNOSIS — H409 Unspecified glaucoma: Secondary | ICD-10-CM | POA: Diagnosis present

## 2019-10-07 DIAGNOSIS — Z961 Presence of intraocular lens: Secondary | ICD-10-CM | POA: Diagnosis present

## 2019-10-07 DIAGNOSIS — G311 Senile degeneration of brain, not elsewhere classified: Secondary | ICD-10-CM | POA: Diagnosis not present

## 2019-10-07 DIAGNOSIS — E871 Hypo-osmolality and hyponatremia: Secondary | ICD-10-CM | POA: Diagnosis present

## 2019-10-07 DIAGNOSIS — F0390 Unspecified dementia without behavioral disturbance: Secondary | ICD-10-CM | POA: Diagnosis present

## 2019-10-07 DIAGNOSIS — W19XXXA Unspecified fall, initial encounter: Secondary | ICD-10-CM | POA: Diagnosis not present

## 2019-10-07 DIAGNOSIS — Z7982 Long term (current) use of aspirin: Secondary | ICD-10-CM | POA: Diagnosis not present

## 2019-10-07 DIAGNOSIS — U071 COVID-19: Secondary | ICD-10-CM | POA: Diagnosis present

## 2019-10-07 DIAGNOSIS — I10 Essential (primary) hypertension: Secondary | ICD-10-CM | POA: Diagnosis not present

## 2019-10-07 DIAGNOSIS — H9190 Unspecified hearing loss, unspecified ear: Secondary | ICD-10-CM | POA: Diagnosis present

## 2019-10-07 DIAGNOSIS — L89321 Pressure ulcer of left buttock, stage 1: Secondary | ICD-10-CM | POA: Diagnosis present

## 2019-10-07 DIAGNOSIS — M25512 Pain in left shoulder: Secondary | ICD-10-CM

## 2019-10-07 DIAGNOSIS — L89152 Pressure ulcer of sacral region, stage 2: Secondary | ICD-10-CM | POA: Diagnosis not present

## 2019-10-07 DIAGNOSIS — E7800 Pure hypercholesterolemia, unspecified: Secondary | ICD-10-CM | POA: Diagnosis present

## 2019-10-07 DIAGNOSIS — R4701 Aphasia: Secondary | ICD-10-CM | POA: Diagnosis not present

## 2019-10-07 DIAGNOSIS — R2232 Localized swelling, mass and lump, left upper limb: Secondary | ICD-10-CM | POA: Diagnosis not present

## 2019-10-07 DIAGNOSIS — R0602 Shortness of breath: Secondary | ICD-10-CM

## 2019-10-07 DIAGNOSIS — J90 Pleural effusion, not elsewhere classified: Secondary | ICD-10-CM | POA: Diagnosis present

## 2019-10-07 DIAGNOSIS — N184 Chronic kidney disease, stage 4 (severe): Secondary | ICD-10-CM | POA: Diagnosis present

## 2019-10-07 DIAGNOSIS — N179 Acute kidney failure, unspecified: Principal | ICD-10-CM | POA: Diagnosis present

## 2019-10-07 DIAGNOSIS — J181 Lobar pneumonia, unspecified organism: Secondary | ICD-10-CM | POA: Diagnosis not present

## 2019-10-07 DIAGNOSIS — E875 Hyperkalemia: Secondary | ICD-10-CM | POA: Diagnosis not present

## 2019-10-07 DIAGNOSIS — D72819 Decreased white blood cell count, unspecified: Secondary | ICD-10-CM | POA: Diagnosis present

## 2019-10-07 DIAGNOSIS — F329 Major depressive disorder, single episode, unspecified: Secondary | ICD-10-CM | POA: Diagnosis present

## 2019-10-07 DIAGNOSIS — H93013 Transient ischemic deafness, bilateral: Secondary | ICD-10-CM | POA: Diagnosis not present

## 2019-10-07 DIAGNOSIS — I517 Cardiomegaly: Secondary | ICD-10-CM | POA: Diagnosis not present

## 2019-10-07 DIAGNOSIS — I444 Left anterior fascicular block: Secondary | ICD-10-CM | POA: Diagnosis present

## 2019-10-07 DIAGNOSIS — M2550 Pain in unspecified joint: Secondary | ICD-10-CM | POA: Diagnosis not present

## 2019-10-07 DIAGNOSIS — M25412 Effusion, left shoulder: Secondary | ICD-10-CM | POA: Diagnosis present

## 2019-10-07 DIAGNOSIS — I160 Hypertensive urgency: Secondary | ICD-10-CM | POA: Diagnosis present

## 2019-10-07 DIAGNOSIS — M1990 Unspecified osteoarthritis, unspecified site: Secondary | ICD-10-CM | POA: Diagnosis not present

## 2019-10-07 DIAGNOSIS — D631 Anemia in chronic kidney disease: Secondary | ICD-10-CM | POA: Diagnosis present

## 2019-10-07 DIAGNOSIS — N289 Disorder of kidney and ureter, unspecified: Secondary | ICD-10-CM | POA: Diagnosis not present

## 2019-10-07 DIAGNOSIS — Z8616 Personal history of COVID-19: Secondary | ICD-10-CM | POA: Diagnosis not present

## 2019-10-07 DIAGNOSIS — J810 Acute pulmonary edema: Secondary | ICD-10-CM | POA: Diagnosis not present

## 2019-10-07 DIAGNOSIS — M19012 Primary osteoarthritis, left shoulder: Secondary | ICD-10-CM | POA: Diagnosis present

## 2019-10-07 DIAGNOSIS — D500 Iron deficiency anemia secondary to blood loss (chronic): Secondary | ICD-10-CM | POA: Diagnosis not present

## 2019-10-07 DIAGNOSIS — E1122 Type 2 diabetes mellitus with diabetic chronic kidney disease: Secondary | ICD-10-CM | POA: Diagnosis present

## 2019-10-07 DIAGNOSIS — S4991XA Unspecified injury of right shoulder and upper arm, initial encounter: Secondary | ICD-10-CM | POA: Diagnosis not present

## 2019-10-07 DIAGNOSIS — R531 Weakness: Secondary | ICD-10-CM | POA: Diagnosis not present

## 2019-10-07 DIAGNOSIS — S4992XA Unspecified injury of left shoulder and upper arm, initial encounter: Secondary | ICD-10-CM | POA: Diagnosis not present

## 2019-10-07 DIAGNOSIS — M79622 Pain in left upper arm: Secondary | ICD-10-CM | POA: Diagnosis not present

## 2019-10-07 DIAGNOSIS — F339 Major depressive disorder, recurrent, unspecified: Secondary | ICD-10-CM | POA: Diagnosis not present

## 2019-10-07 DIAGNOSIS — Z7401 Bed confinement status: Secondary | ICD-10-CM | POA: Diagnosis not present

## 2019-10-07 DIAGNOSIS — R609 Edema, unspecified: Secondary | ICD-10-CM | POA: Diagnosis not present

## 2019-10-07 DIAGNOSIS — J811 Chronic pulmonary edema: Secondary | ICD-10-CM | POA: Diagnosis not present

## 2019-10-07 DIAGNOSIS — Z79899 Other long term (current) drug therapy: Secondary | ICD-10-CM

## 2019-10-07 DIAGNOSIS — R519 Headache, unspecified: Secondary | ICD-10-CM | POA: Diagnosis not present

## 2019-10-07 LAB — CBC WITH DIFFERENTIAL/PLATELET
Abs Immature Granulocytes: 0.01 10*3/uL (ref 0.00–0.07)
Basophils Absolute: 0 10*3/uL (ref 0.0–0.1)
Basophils Relative: 1 %
Eosinophils Absolute: 0 10*3/uL (ref 0.0–0.5)
Eosinophils Relative: 1 %
HCT: 31.3 % — ABNORMAL LOW (ref 36.0–46.0)
Hemoglobin: 10.1 g/dL — ABNORMAL LOW (ref 12.0–15.0)
Immature Granulocytes: 0 %
Lymphocytes Relative: 30 %
Lymphs Abs: 1.2 10*3/uL (ref 0.7–4.0)
MCH: 33 pg (ref 26.0–34.0)
MCHC: 32.3 g/dL (ref 30.0–36.0)
MCV: 102.3 fL — ABNORMAL HIGH (ref 80.0–100.0)
Monocytes Absolute: 0.2 10*3/uL (ref 0.1–1.0)
Monocytes Relative: 6 %
Neutro Abs: 2.5 10*3/uL (ref 1.7–7.7)
Neutrophils Relative %: 62 %
Platelets: 226 10*3/uL (ref 150–400)
RBC: 3.06 MIL/uL — ABNORMAL LOW (ref 3.87–5.11)
RDW: 13.2 % (ref 11.5–15.5)
WBC: 3.9 10*3/uL — ABNORMAL LOW (ref 4.0–10.5)
nRBC: 0 % (ref 0.0–0.2)

## 2019-10-07 LAB — COMPREHENSIVE METABOLIC PANEL
ALT: 14 U/L (ref 0–44)
AST: 30 U/L (ref 15–41)
Albumin: 3.6 g/dL (ref 3.5–5.0)
Alkaline Phosphatase: 53 U/L (ref 38–126)
Anion gap: 11 (ref 5–15)
BUN: 42 mg/dL — ABNORMAL HIGH (ref 8–23)
CO2: 21 mmol/L — ABNORMAL LOW (ref 22–32)
Calcium: 9.2 mg/dL (ref 8.9–10.3)
Chloride: 99 mmol/L (ref 98–111)
Creatinine, Ser: 1.8 mg/dL — ABNORMAL HIGH (ref 0.44–1.00)
GFR calc Af Amer: 29 mL/min — ABNORMAL LOW (ref >60)
GFR calc non Af Amer: 25 mL/min — ABNORMAL LOW (ref >60)
Glucose, Bld: 88 mg/dL (ref 70–99)
Potassium: 6.5 mmol/L (ref 3.5–5.1)
Sodium: 131 mmol/L — ABNORMAL LOW (ref 135–145)
Total Bilirubin: 0.6 mg/dL (ref 0.3–1.2)
Total Protein: 6.8 g/dL (ref 6.5–8.1)

## 2019-10-07 LAB — SARS CORONAVIRUS 2 BY RT PCR (HOSPITAL ORDER, PERFORMED IN ~~LOC~~ HOSPITAL LAB): SARS Coronavirus 2: NEGATIVE

## 2019-10-07 LAB — MAGNESIUM: Magnesium: 1.7 mg/dL (ref 1.7–2.4)

## 2019-10-07 LAB — POTASSIUM: Potassium: 6.1 mmol/L — ABNORMAL HIGH (ref 3.5–5.1)

## 2019-10-07 LAB — CBG MONITORING, ED: Glucose-Capillary: 81 mg/dL (ref 70–99)

## 2019-10-07 MED ORDER — FERROUS SULFATE 325 (65 FE) MG PO TABS
325.0000 mg | ORAL_TABLET | Freq: Every day | ORAL | Status: DC
  Administered 2019-10-08 – 2019-10-21 (×14): 325 mg via ORAL
  Filled 2019-10-07 (×14): qty 1

## 2019-10-07 MED ORDER — MIRTAZAPINE 15 MG PO TABS
15.0000 mg | ORAL_TABLET | Freq: Every day | ORAL | Status: DC
  Administered 2019-10-07 – 2019-10-20 (×14): 15 mg via ORAL
  Filled 2019-10-07 (×7): qty 1
  Filled 2019-10-07: qty 2
  Filled 2019-10-07 (×7): qty 1

## 2019-10-07 MED ORDER — INSULIN ASPART 100 UNIT/ML IV SOLN
10.0000 [IU] | Freq: Once | INTRAVENOUS | Status: AC
  Administered 2019-10-07: 10 [IU] via INTRAVENOUS
  Filled 2019-10-07: qty 0.1

## 2019-10-07 MED ORDER — ESCITALOPRAM OXALATE 10 MG PO TABS
10.0000 mg | ORAL_TABLET | Freq: Every day | ORAL | Status: DC
  Administered 2019-10-08 – 2019-10-21 (×14): 10 mg via ORAL
  Filled 2019-10-07 (×14): qty 1

## 2019-10-07 MED ORDER — DEXTROSE 50 % IV SOLN
1.0000 | Freq: Once | INTRAVENOUS | Status: AC
  Administered 2019-10-07: 50 mL via INTRAVENOUS
  Filled 2019-10-07: qty 50

## 2019-10-07 MED ORDER — PRAVASTATIN SODIUM 20 MG PO TABS
10.0000 mg | ORAL_TABLET | Freq: Every day | ORAL | Status: DC
  Administered 2019-10-08 – 2019-10-20 (×13): 10 mg via ORAL
  Filled 2019-10-07 (×13): qty 1

## 2019-10-07 MED ORDER — ONDANSETRON HCL 4 MG/2ML IJ SOLN: 4.0000 mg | Freq: Four times a day (QID) | INTRAMUSCULAR | Status: DC | PRN

## 2019-10-07 MED ORDER — BRIMONIDINE TARTRATE-TIMOLOL 0.2-0.5 % OP SOLN: 1.0000 [drp] | Freq: Two times a day (BID) | OPHTHALMIC | Status: DC

## 2019-10-07 MED ORDER — DOCUSATE SODIUM 100 MG PO CAPS
100.0000 mg | ORAL_CAPSULE | Freq: Every day | ORAL | Status: DC
  Administered 2019-10-08 – 2019-10-09 (×2): 100 mg via ORAL
  Filled 2019-10-07 (×2): qty 1

## 2019-10-07 MED ORDER — SODIUM ZIRCONIUM CYCLOSILICATE 10 G PO PACK
10.0000 g | PACK | Freq: Once | ORAL | Status: AC
  Administered 2019-10-07: 10 g via ORAL
  Filled 2019-10-07: qty 1

## 2019-10-07 MED ORDER — CARVEDILOL 25 MG PO TABS
25.0000 mg | ORAL_TABLET | Freq: Two times a day (BID) | ORAL | Status: DC
  Administered 2019-10-07 – 2019-10-21 (×28): 25 mg via ORAL
  Filled 2019-10-07: qty 2
  Filled 2019-10-07 (×27): qty 1

## 2019-10-07 MED ORDER — FENTANYL CITRATE (PF) 100 MCG/2ML IJ SOLN
50.0000 ug | Freq: Once | INTRAMUSCULAR | Status: AC
  Administered 2019-10-07: 50 ug via INTRAVENOUS
  Filled 2019-10-07: qty 2

## 2019-10-07 MED ORDER — POLYVINYL ALCOHOL 1.4 % OP SOLN
1.0000 [drp] | Freq: Two times a day (BID) | OPHTHALMIC | Status: DC
  Administered 2019-10-08 – 2019-10-21 (×25): 1 [drp] via OPHTHALMIC
  Filled 2019-10-07: qty 15

## 2019-10-07 MED ORDER — SODIUM CHLORIDE 0.9 % IV BOLUS
1000.0000 mL | Freq: Once | INTRAVENOUS | Status: AC
  Administered 2019-10-07: 1000 mL via INTRAVENOUS

## 2019-10-07 MED ORDER — CALCIUM GLUCONATE-NACL 1-0.675 GM/50ML-% IV SOLN
1.0000 g | Freq: Once | INTRAVENOUS | Status: AC
  Administered 2019-10-07: 1000 mg via INTRAVENOUS
  Filled 2019-10-07: qty 50

## 2019-10-07 MED ORDER — SODIUM CHLORIDE 0.9 % IV SOLN: INTRAVENOUS | Status: AC

## 2019-10-07 MED ORDER — TIMOLOL MALEATE 0.5 % OP SOLN
1.0000 [drp] | Freq: Two times a day (BID) | OPHTHALMIC | Status: DC
  Administered 2019-10-08 – 2019-10-21 (×26): 1 [drp] via OPHTHALMIC
  Filled 2019-10-07: qty 5

## 2019-10-07 MED ORDER — SODIUM CHLORIDE 0.9 % IV BOLUS
500.0000 mL | Freq: Once | INTRAVENOUS | Status: AC
  Administered 2019-10-07: 500 mL via INTRAVENOUS

## 2019-10-07 MED ORDER — HYDRALAZINE HCL 20 MG/ML IJ SOLN
10.0000 mg | INTRAMUSCULAR | Status: DC | PRN
  Administered 2019-10-07 – 2019-10-20 (×11): 10 mg via INTRAVENOUS
  Filled 2019-10-07 (×13): qty 1

## 2019-10-07 MED ORDER — ASPIRIN 81 MG PO CHEW
81.0000 mg | CHEWABLE_TABLET | Freq: Every day | ORAL | Status: DC
  Administered 2019-10-08 – 2019-10-21 (×14): 81 mg via ORAL
  Filled 2019-10-07 (×14): qty 1

## 2019-10-07 MED ORDER — CLONIDINE HCL 0.3 MG PO TABS
0.3000 mg | ORAL_TABLET | Freq: Every day | ORAL | Status: DC
  Administered 2019-10-08 – 2019-10-17 (×10): 0.3 mg via ORAL
  Filled 2019-10-07 (×11): qty 1

## 2019-10-07 MED ORDER — DOXAZOSIN MESYLATE 4 MG PO TABS
16.0000 mg | ORAL_TABLET | Freq: Every day | ORAL | Status: DC
  Administered 2019-10-07 – 2019-10-20 (×14): 16 mg via ORAL
  Filled 2019-10-07 (×2): qty 4
  Filled 2019-10-07: qty 8
  Filled 2019-10-07 (×4): qty 4
  Filled 2019-10-07: qty 2
  Filled 2019-10-07 (×7): qty 4

## 2019-10-07 MED ORDER — BRIMONIDINE TARTRATE 0.2 % OP SOLN
1.0000 [drp] | Freq: Two times a day (BID) | OPHTHALMIC | Status: DC
  Administered 2019-10-08 – 2019-10-21 (×26): 1 [drp] via OPHTHALMIC
  Filled 2019-10-07: qty 5

## 2019-10-07 MED ORDER — ONDANSETRON HCL 4 MG PO TABS: 4.0000 mg | ORAL_TABLET | Freq: Four times a day (QID) | ORAL | Status: DC | PRN

## 2019-10-07 MED ORDER — TRAZODONE HCL 50 MG PO TABS
50.0000 mg | ORAL_TABLET | Freq: Every day | ORAL | Status: DC
  Administered 2019-10-07 – 2019-10-20 (×14): 50 mg via ORAL
  Filled 2019-10-07 (×14): qty 1

## 2019-10-07 MED ORDER — DONEPEZIL HCL 10 MG PO TABS
10.0000 mg | ORAL_TABLET | Freq: Every day | ORAL | Status: DC
  Administered 2019-10-07 – 2019-10-20 (×14): 10 mg via ORAL
  Filled 2019-10-07 (×13): qty 1
  Filled 2019-10-07: qty 2

## 2019-10-07 MED ORDER — INSULIN ASPART 100 UNIT/ML ~~LOC~~ SOLN
0.0000 [IU] | Freq: Three times a day (TID) | SUBCUTANEOUS | Status: DC
  Administered 2019-10-09: 1 [IU] via SUBCUTANEOUS
  Administered 2019-10-10: 2 [IU] via SUBCUTANEOUS
  Administered 2019-10-11: 1 [IU] via SUBCUTANEOUS
  Administered 2019-10-12: 2 [IU] via SUBCUTANEOUS
  Administered 2019-10-14: 1 [IU] via SUBCUTANEOUS
  Administered 2019-10-14 – 2019-10-15 (×2): 2 [IU] via SUBCUTANEOUS
  Administered 2019-10-17: 3 [IU] via SUBCUTANEOUS
  Administered 2019-10-18: 1 [IU] via SUBCUTANEOUS
  Administered 2019-10-19: 2 [IU] via SUBCUTANEOUS
  Administered 2019-10-19 – 2019-10-20 (×3): 1 [IU] via SUBCUTANEOUS
  Administered 2019-10-21: 2 [IU] via SUBCUTANEOUS
  Filled 2019-10-07: qty 0.09

## 2019-10-07 NOTE — H&P (Signed)
History and Physical    [name] KDT:[id number] DOB: 29-Jun-1932 DOA: 10/07/2019  PCP: Leonides Sake, MD  Patient coming from: [place].  Chief Complaint: Left shoulder swelling.  Most of the history is obtained through sign language provided by patient's son.  HPI: [name] is a 86 y.o. female with history of deafness, dementia, diabetes mellitus type 2, chronic kidney disease baseline creatinine around 1.2 hypertension was brought to the ER after patient's left shoulder was found to be swollen today around 4 PM.  It is not known if patient had a fall or not.  Per patient's son who provided most of the history patient son has visited her yesterday and at the time no complaints were made.  ED Course: In the ER on exam patient has left shoulder swelling and has difficulty moving her arm because of the pain from the swelling.  The swelling does not appear to be erythematous or warm.  X-rays of both shoulder shows degenerative changes.  Since it was not known if patient had a fall or not CT head and C-spine were done which were unremarkable.  X-ray pelvis unremarkable.  Labs show hyperkalemia of 6.5 which was repeated it was 6.1.  Creatinine has increased from 1.2-1.8 hemoglobin is 10.1 and platelets 226.  EKG shows normal sinus rhythm.  Patient was given a fluid bolus because of acute renal failure and since patient's repeat potassium is still elevated patient was given Lokelma D50 IV insulin and IV calcium gluconate admitted for acute renal failure with hyperkalemia and further work-up of left shoulder pain.  Covid test was negative.  Review of Systems: As per HPI, rest all negative.   Past Medical History:  Diagnosis Date  . Alcohol abuse   . Anemia   . Cataract   . CKD (chronic kidney disease)   . Deaf   . Dementia ([place])   . Diabetes mellitus   . Dry eyes   . Hypercholesteremia   . Hyperlipidemia   . Hypertension     Past Surgical History:    Procedure Laterality Date  . CATARACT EXTRACTION W/ INTRAOCULAR LENS IMPLANT     left eye  . EYE SURGERY    . PARS PLANA VITRECTOMY Left 05/04/2017   Procedure: PARS PLANA VITRECTOMY LEFT EYE WITH 25 GAUGE WITH ENDOLASER;  Surgeon: Jalene Mullet, MD;  Location: [facility];  Service: Ophthalmology;  Laterality: Left;     reports that she has quit smoking. She has never used smokeless tobacco. She reports current alcohol use. She reports that she does not use drugs.  No Known Allergies  Family History  Family history unknown: Yes    Prior to Admission medications   Medication Sig Start Date End Date Taking? Authorizing Provider  aspirin 81 MG chewable tablet Chew 81 mg by mouth daily. (0700)    [provider]  brimonidine-timolol (COMBIGAN) 0.2-0.5 % ophthalmic solution Place 1 drop into both eyes 2 (two) times daily. (Separate from other eye drops by at least 10 minutes)     [provider]  carvedilol (COREG) 25 MG tablet Take 25 mg by mouth 2 (two) times daily. (0700 & 1900)    [provider]  cephALEXin (KEFLEX) 500 MG capsule Take 500 mg by mouth 3 (three) times daily. 08/07/19   [provider]  cloNIDine (CATAPRES) 0.3 MG tablet Take 0.3 mg by mouth daily. (0700)    [provider]  Dextrose, Diabetic Use, (INSTA-GLUCOSE PO) Take 1  Dose by mouth daily as needed (for blood sugar less than 70--recheck in 15 minutes.).    [provider]  docusate sodium (COLACE) 100 MG capsule Take 100 mg by mouth daily. (0700)    [provider]  donepezil (ARICEPT) 10 MG tablet Take 10 mg by mouth at bedtime. (1900)    [provider]  doxazosin (CARDURA) 8 MG tablet Take 16 mg by mouth at bedtime. (1900)    [provider]  escitalopram (LEXAPRO) 10 MG tablet Take 10 mg by mouth daily. (0700)    [provider]  ferrous sulfate 325 (65 FE) MG tablet Take 325 mg by mouth daily. (0700)    [provider]   Glycerin-Hypromellose-PEG 400 (ARTIFICIAL TEARS) 0.2-0.2-1 % SOLN Place 1 drop into both eyes 2 (two) times daily. (1200 and 1900)    [provider]  ILEVRO 0.3 % ophthalmic suspension  08/02/19   [provider]  lovastatin (MEVACOR) 10 MG tablet Take 10 mg by mouth at bedtime. 09/05/19   [provider]  lovastatin (MEVACOR) 20 MG tablet Take 20 mg by mouth at bedtime. (1900)    [provider]  metFORMIN (GLUCOPHAGE) 1000 MG tablet Take 1,000 mg by mouth 2 (two) times daily with a meal. (0700 & 1500)    [provider]  mirtazapine (REMERON) 15 MG tablet Take 15 mg by mouth at bedtime.     [provider]  traZODone (DESYREL) 50 MG tablet Take 50 mg by mouth at bedtime. (1900)     [provider]    Physical Exam: Constitutional: Moderately built and nourished. Vitals:   10/07/19 1836 10/07/19 1838 10/07/19 1839 10/07/19 1900  BP:    (!) 175/105  Pulse:  (!) 58 (!) 56 64  Resp: 11 11 (!) 9 12  Temp:      TempSrc:      SpO2:  98% 98% 95%   Eyes: Anicteric no pallor. ENMT: No discharge from the ears eyes nose or mouth. Neck: No mass felt.  No neck rigidity. Respiratory: No rhonchi or crepitations. Cardiovascular: S1-S2 heard. Abdomen: Soft nontender bowel sounds present. Musculoskeletal: Left shoulder is swollen but has no skin changes and is not warm to touch.  Has restriction of movement because of the pain. Skin: Chronic skin changes of the both feet dorsal aspect of the toes. Neurologic: Alert awake moves all extremities. Psychiatric: Alert and awake.  Patient communicates with sign language.   Labs on Admission: I have personally reviewed following labs and imaging studies  CBC: Recent Labs  Lab 10/07/19 1802  WBC 3.9*  NEUTROABS 2.5  HGB 10.1*  HCT 31.3*  MCV 102.3*  PLT 185   Basic Metabolic Panel: Recent Labs  Lab 10/07/19 1802 10/07/19 2004  NA 131*  --   K 6.5* 6.1*  CL 99  --   CO2 21*  --    GLUCOSE 88  --   BUN 42*  --   CREATININE 1.80*  --   CALCIUM 9.2  --   MG  --  1.7   GFR: CrCl cannot be calculated (Unknown ideal weight.). Liver Function Tests: Recent Labs  Lab 10/07/19 1802  AST 30  ALT 14  ALKPHOS 53  BILITOT 0.6  PROT 6.8  ALBUMIN 3.6   No results for input(s): LIPASE, AMYLASE in the last 168 hours. No results for input(s): AMMONIA in the last 168 hours. Coagulation Profile: No results for input(s): INR, PROTIME in the last 168  hours. Cardiac Enzymes: No results for input(s): CKTOTAL, CKMB, CKMBINDEX, TROPONINI in the last 168 hours. BNP (last 3 results) No results for input(s): PROBNP in the last 8760 hours. HbA1C: No results for input(s): HGBA1C in the last 72 hours. CBG: No results for input(s): GLUCAP in the last 168 hours. Lipid Profile: No results for input(s): CHOL, HDL, LDLCALC, TRIG, CHOLHDL, LDLDIRECT in the last 72 hours. Thyroid Function Tests: No results for input(s): TSH, T4TOTAL, FREET4, T3FREE, THYROIDAB in the last 72 hours. Anemia Panel: No results for input(s): VITAMINB12, FOLATE, FERRITIN, TIBC, IRON, RETICCTPCT in the last 72 hours. Urine analysis:    Component Value Date/Time   COLORURINE YELLOW 08/22/2017 1219   APPEARANCEUR HAZY (A) 08/22/2017 1219   LABSPEC 1.023 08/22/2017 1219   PHURINE 6.0 08/22/2017 1219   GLUCOSEU NEGATIVE 08/22/2017 1219   HGBUR NEGATIVE 08/22/2017 1219   BILIRUBINUR NEGATIVE 08/22/2017 1219   [place] 08/22/2017 1219   PROTEINUR NEGATIVE 08/22/2017 1219   UROBILINOGEN 0.2 12/22/2010 1421   NITRITE NEGATIVE 08/22/2017 1219   LEUKOCYTESUR SMALL (A) 08/22/2017 1219   Sepsis Labs: @LABRCNTIP (procalcitonin:4,lacticidven:4) )No results found for this or any previous visit (from the past 240 hour(s)).   Radiological Exams on Admission: DG Chest 1 View  Result Date: 10/07/2019 CLINICAL DATA:  Fall. EXAM: CHEST  1 VIEW COMPARISON:  March 18, 2018. FINDINGS: Stable cardiomegaly.  No pneumothorax or pleural effusion is noted. Both lungs are clear. The visualized skeletal structures are unremarkable. IMPRESSION: No active disease. Electronically Signed   By: Marijo Conception M.D.   On: 10/07/2019 18:26   DG Pelvis 1-2 Views  Result Date: 10/07/2019 CLINICAL DATA:  Fall today. EXAM: PELVIS - 1-2 VIEW COMPARISON:  January 27, 2007. FINDINGS: There is no evidence of pelvic fracture or diastasis. No pelvic bone lesions are seen. IMPRESSION: Negative. Electronically Signed   By: Marijo Conception M.D.   On: 10/07/2019 18:30   DG Shoulder Right  Result Date: 10/07/2019 CLINICAL DATA:  Bilateral shoulder pain after fall today. EXAM: RIGHT SHOULDER - 2+ VIEW COMPARISON:  February 21, 2011. FINDINGS: There is no evidence of fracture or dislocation. Severe degenerative changes seen involving the right glenohumeral joint. Soft tissues are unremarkable. IMPRESSION: Severe degenerative joint disease of the right glenohumeral joint. No acute abnormality seen in the right shoulder. Electronically Signed   By: Marijo Conception M.D.   On: 10/07/2019 18:28   CT Head Wo Contrast  Result Date: 10/07/2019 CLINICAL DATA:  Unwitnessed fall. Complains of left shoulder injury. EXAM: CT HEAD WITHOUT CONTRAST CT CERVICAL SPINE WITHOUT CONTRAST TECHNIQUE: Multidetector CT imaging of the head and cervical spine was performed following the standard protocol without intravenous contrast. Multiplanar CT image reconstructions of the cervical spine were also generated. COMPARISON:  10/25/2017 FINDINGS: CT HEAD FINDINGS Brain: No evidence of acute infarction, hemorrhage, hydrocephalus, extra-axial collection or mass lesion/mass effect. There is mild diffuse low-attenuation within the subcortical and periventricular white matter compatible with chronic microvascular disease. Chronic left basal ganglia lacunar infarct noted. Vascular: No hyperdense vessel or unexpected calcification. Skull: Normal. Negative for fracture or  focal lesion. Sinuses/Orbits: No acute finding. Other: None. CT CERVICAL SPINE FINDINGS Alignment: Normal. Skull base and vertebrae: No acute fracture. No primary bone lesion or focal pathologic process. Soft tissues and spinal canal: No prevertebral fluid or swelling. No visible canal hematoma. Disc levels: Marked multi level disc space narrowing and endplate spurring is identified. Upper chest: Negative. Other: None IMPRESSION: 1. No acute intracranial abnormalities.  Chronic small vessel ischemic change and brain atrophy. 2. No evidence for cervical spine fracture. 3. Advanced cervical degenerative disc disease. Electronically Signed   By: Kerby Moors M.D.   On: 10/07/2019 18:40   CT Cervical Spine Wo Contrast  Result Date: 10/07/2019 CLINICAL DATA:  Unwitnessed fall. Complains of left shoulder injury. EXAM: CT HEAD WITHOUT CONTRAST CT CERVICAL SPINE WITHOUT CONTRAST TECHNIQUE: Multidetector CT imaging of the head and cervical spine was performed following the standard protocol without intravenous contrast. Multiplanar CT image reconstructions of the cervical spine were also generated. COMPARISON:  10/25/2017 FINDINGS: CT HEAD FINDINGS Brain: No evidence of acute infarction, hemorrhage, hydrocephalus, extra-axial collection or mass lesion/mass effect. There is mild diffuse low-attenuation within the subcortical and periventricular white matter compatible with chronic microvascular disease. Chronic left basal ganglia lacunar infarct noted. Vascular: No hyperdense vessel or unexpected calcification. Skull: Normal. Negative for fracture or focal lesion. Sinuses/Orbits: No acute finding. Other: None. CT CERVICAL SPINE FINDINGS Alignment: Normal. Skull base and vertebrae: No acute fracture. No primary bone lesion or focal pathologic process. Soft tissues and spinal canal: No prevertebral fluid or swelling. No visible canal hematoma. Disc levels: Marked multi level disc space narrowing and endplate spurring is  identified. Upper chest: Negative. Other: None IMPRESSION: 1. No acute intracranial abnormalities. Chronic small vessel ischemic change and brain atrophy. 2. No evidence for cervical spine fracture. 3. Advanced cervical degenerative disc disease. Electronically Signed   By: Kerby Moors M.D.   On: 10/07/2019 18:40   DG Shoulder Left  Result Date: 10/07/2019 CLINICAL DATA:  Left shoulder pain after fall. EXAM: LEFT SHOULDER - 2+ VIEW COMPARISON:  December 18, 2007. FINDINGS: There is no evidence of fracture or dislocation. Mild degenerative changes are seen involving the left glenohumeral joint. Soft tissues are unremarkable. IMPRESSION: Mild degenerative joint disease of the left glenohumeral joint. No acute abnormality is noted. Electronically Signed   By: Marijo Conception M.D.   On: 10/07/2019 18:27   DG Humerus Left  Result Date: 10/07/2019 CLINICAL DATA:  86 year old with pain after fall.  Unwitnessed fall. EXAM: LEFT HUMERUS - 2+ VIEW COMPARISON:  Shoulder radiograph earlier this day. FINDINGS: The proximal humerus is better assessed on recent shoulder x-rays. Distal humerus is intact. Elbow alignment is maintained. No focal soft tissue abnormality. IMPRESSION: No fracture of the left humerus, proximal humerus better assessed on shoulder radiograph earlier today. Electronically Signed   By: Keith Rake M.D.   On: 10/07/2019 20:07    EKG: Independently reviewed.  Normal sinus rhythm.  Assessment/Plan Principal Problem:   ARF (acute renal failure) (HCC) Active Problems:   Hyperkalemia   Swelling of joint of left shoulder   Hypertensive urgency   DM type 2 (diabetes mellitus, type 2) ([place])   Dementia ([place])   Acute renal failure (ARF) ([place])    1. Acute renal failure with hyperkalemia -patient's son states that patient has benign not eating well off and on.  Could be the cause for the patient's renal failure.  Check FENa urine analysis for now patient is receiving 1 L fluid bolus and  will gently hydrate.  Follow metabolic panel. 2. Hyperkalemia -cause not clear.  Could be due to worsening renal function.  I have ordered Lokelma D50 IV insulin NovoLog 10 units and calcium gluconate.  Follow metabolic panel. 3. Left shoulder swelling cause not clear x-ray shows degenerative changes I have ordered CT of the left shoulder and humerus.  Based on which we'll have further plans. 4.  Hypertensive urgency for which I have placed patient on as needed IV hydralazine we'll continue patient's carvedilol Cardura clonidine.  Closely follow blood pressure trends. 5. Chronic anemia appears macrocytic at this time.  Will check anemia panel with next blood draw.  Follow CBC. 6. History of dementia on Aricept.   DVT prophylaxis: SCDs for now until we get the CT scan results to make sure if patient needs to be on any procedure or not for which I'm holding off pharmacological DVT prophylaxis. Code Status: Full code as confirmed with patient's son. Family Communication: Patient's son. Disposition Plan: Back to facility when stable. Consults called: None. Admission status: Observation.   Rise Patience MD [facility] Pager [phone].  If 7PM-7AM, please contact night-coverage [email or web address] Password [email or web address]  10/07/2019, 9:13 PM

## 2019-10-07 NOTE — Progress Notes (Signed)
  Subjective:  Patient ID: [name], female    DOB: 05/09/1932,  MRN: [id number]  Chief Complaint  Patient presents with  . Wound Check    Bilateral 2nd toes. x2 weeks. Caregiver stated, "I have personally noted white drainage from the R 2nd toe. Another caregiver reported yellow drainage. No pain, fever/chills, N&V.  No drainage from the L 2nd toe. We are applying triple antibiotic ointment".  . Callouses    Bilateral - tops of toes.   86 y.o. female presents with the above complaint. History confirmed with patient.   Objective:  Physical Exam: warm, good capillary refill, no trophic changes or ulcerative lesions, normal DP and PT pulses and normal sensory exam. 2nd toes interdigital maceration with ulcerated corns without warmth, erythema, signs of infection.  Assessment:   1. Ulcer of toe, right, limited to breakdown of skin ([place])   2. Ulcer of toe, left, limited to breakdown of skin [facility])     Plan:  Patient was evaluated and treated and all questions answered.  Wounds 2nd Toes Bilat. -Dressed with abx ointment and band-aid. -Continue surgical shoes -Wounds to the toes do not appear infected.   Patient instructions: -Apply neosporin and band-aid to the toes daily. -Apply betadine or cotton in between toes after bathing to keep them dry.   No follow-ups on file.

## 2019-10-07 NOTE — ED Notes (Addendum)
Date and time results received: 10/07/19 7:25 PM  (use smartphrase ".now" to insert current time)  Test: potassium Critical Value: 6.5  Name of Provider Notified: Quintella Reichert, MD  Orders Received? Or Actions Taken?: re-collect of potassium; tube was slightly hemolyzed

## 2019-10-07 NOTE — ED Triage Notes (Signed)
Patient here via EMS from [facility] with complaints of left shoulder injury from unwitnessed fall today. Facility unsure of how patient fell but does use a wheelchair. Patient is mute and can communicate via notepad.

## 2019-10-07 NOTE — ED Provider Notes (Signed)
[facility] DEPT Provider Note   CSN: [id number] Arrival date & time: 10/07/19  1652     History Chief Complaint  Patient presents with  . Fall  . Shoulder Injury    [name] is a 86 y.o. female.  The history is provided by the patient and medical records. A language interpreter was used (ASL and [employer]).  Fall  Shoulder Injury   [name] is a 86 y.o. female who presents to the Emergency Department complaining of fall. Level V caveat due to language barrier. History obtained through ASL and CDI interpreters. She presents the emergency department per patient after falling asleep and falling. She complains of pain to bilateral shoulders, left greater than right. She also complains of recent vomiting, dysuria. Per facility report she did not have a fall but began to complain of left shoulder pain and has not been ill recently.    Past Medical History:  Diagnosis Date  . Alcohol abuse   . Anemia   . Cataract   . CKD (chronic kidney disease)   . Deaf   . Dementia ([place])   . Diabetes mellitus   . Dry eyes   . Hypercholesteremia   . Hyperlipidemia   . Hypertension     There are no problems to display for this patient.   Past Surgical History:  Procedure Laterality Date  . CATARACT EXTRACTION W/ INTRAOCULAR LENS IMPLANT     left eye  . EYE SURGERY    . PARS PLANA VITRECTOMY Left 05/04/2017   Procedure: PARS PLANA VITRECTOMY LEFT EYE WITH 25 GAUGE WITH ENDOLASER;  Surgeon: Jalene Mullet, MD;  Location: [facility];  Service: Ophthalmology;  Laterality: Left;     OB History   No obstetric history on file.     No family history on file.  Social History   Tobacco Use  . Smoking status: Former Research scientist (life sciences)  . Smokeless tobacco: Never Used  Vaping Use  . Vaping Use: Never used  Substance Use Topics  . Alcohol use: Yes  . Drug use: No    Home Medications Prior to Admission medications   Medication Sig Start Date End Date  Taking? Authorizing Provider  aspirin 81 MG chewable tablet Chew 81 mg by mouth daily. (0700)    [provider]  brimonidine-timolol (COMBIGAN) 0.2-0.5 % ophthalmic solution Place 1 drop into both eyes 2 (two) times daily. (Separate from other eye drops by at least 10 minutes)     [provider]  carvedilol (COREG) 25 MG tablet Take 25 mg by mouth 2 (two) times daily. (0700 & 1900)    [provider]  cephALEXin (KEFLEX) 500 MG capsule Take 500 mg by mouth 3 (three) times daily. 08/07/19   [provider]  cloNIDine (CATAPRES) 0.3 MG tablet Take 0.3 mg by mouth daily. (0700)    [provider]  Dextrose, Diabetic Use, (INSTA-GLUCOSE PO) Take 1 Dose by mouth daily as needed (for blood sugar less than 70--recheck in 15 minutes.).    [provider]  docusate sodium (COLACE) 100 MG capsule Take 100 mg by mouth daily. (0700)    [provider]  donepezil (ARICEPT) 10 MG tablet Take 10 mg by mouth at bedtime. (1900)    [provider]  doxazosin (CARDURA) 8 MG tablet Take 16 mg by mouth at bedtime. (1900)    [provider]  escitalopram (LEXAPRO) 10 MG tablet Take 10 mg by mouth daily. (0700)  [provider]  ferrous sulfate 325 (65 FE) MG tablet Take 325 mg by mouth daily. (0700)    [provider]  Glycerin-Hypromellose-PEG 400 (ARTIFICIAL TEARS) 0.2-0.2-1 % SOLN Place 1 drop into both eyes 2 (two) times daily. (1200 and 1900)    [provider]  ILEVRO 0.3 % ophthalmic suspension  08/02/19   [provider]  lovastatin (MEVACOR) 10 MG tablet Take 10 mg by mouth at bedtime. 09/05/19   [provider]  lovastatin (MEVACOR) 20 MG tablet Take 20 mg by mouth at bedtime. (1900)    [provider]  metFORMIN (GLUCOPHAGE) 1000 MG tablet Take 1,000 mg by mouth 2 (two) times daily with a meal. (0700 & 1500)    [provider]  mirtazapine (REMERON) 15 MG tablet Take  15 mg by mouth at bedtime.     [provider]  traZODone (DESYREL) 50 MG tablet Take 50 mg by mouth at bedtime. (1900)     [provider]    Allergies    Patient has no known allergies.  Review of Systems   Review of Systems  All other systems reviewed and are negative.   Physical Exam Updated Vital Signs BP (!) 175/105   Pulse 64   Temp 98.5 F (36.9 C) (Oral)   Resp 12   SpO2 95%   Physical Exam Vitals and nursing note reviewed.  Constitutional:      Appearance: She is well-developed.  HENT:     Head: Normocephalic and atraumatic.  Cardiovascular:     Rate and Rhythm: Normal rate and regular rhythm.     Heart sounds: No murmur heard.   Pulmonary:     Effort: Pulmonary effort is normal. No respiratory distress.     Breath sounds: Normal breath sounds.  Abdominal:     Palpations: Abdomen is soft.     Tenderness: There is no abdominal tenderness. There is no guarding or rebound.  Musculoskeletal:     Comments: 2+ radial pulses bilaterally. There is significant tenderness and swelling to the left shoulder with decreased range of motion in the left upper extremity. No discrete bony tenderness to the left elbow, wrist. There is minor tenderness to the right shoulder without significant swelling. No hip tenderness to palpation. There is trace edema to bilateral lower extremities.  Skin:    General: Skin is warm and dry.  Neurological:     Mental Status: She is alert and oriented to person, place, and time.  Psychiatric:        Behavior: Behavior normal.     ED Results / Procedures / Treatments   Labs (all labs ordered are listed, but only abnormal results are displayed) Labs Reviewed  COMPREHENSIVE METABOLIC PANEL - Abnormal; Notable for the following components:      Result Value   Sodium 131 (*)    Potassium 6.5 (*)    CO2 21 (*)    BUN 42 (*)    Creatinine, Ser 1.80 (*)    GFR calc non Af Amer 25 (*)    GFR calc Af Amer 29 (*)    All  other components within normal limits  CBC WITH DIFFERENTIAL/PLATELET - Abnormal; Notable for the following components:   WBC 3.9 (*)    RBC 3.06 (*)    Hemoglobin 10.1 (*)    HCT 31.3 (*)    MCV 102.3 (*)    All other components within normal limits  POTASSIUM - Abnormal; Notable for the following components:  Potassium 6.1 (*)    All other components within normal limits  URINE CULTURE  SARS CORONAVIRUS 2 BY RT PCR [facility] ORDER, [facility])  URINALYSIS, ROUTINE W REFLEX MICROSCOPIC  MAGNESIUM    EKG EKG Interpretation  Date/Time:  Monday October 07 2019 18:36:28 EDT Ventricular Rate:  64 PR Interval:    QRS Duration: 92 QT Interval:  407 QTC Calculation: 420 R Axis:   -42 Text Interpretation: Sinus rhythm Left anterior fascicular block Borderline low voltage, extremity leads Confirmed by Quintella Reichert [phone]) on 10/07/2019 6:41:22 PM   Radiology DG Chest 1 View  Result Date: 10/07/2019 CLINICAL DATA:  Fall. EXAM: CHEST  1 VIEW COMPARISON:  March 18, 2018. FINDINGS: Stable cardiomegaly. No pneumothorax or pleural effusion is noted. Both lungs are clear. The visualized skeletal structures are unremarkable. IMPRESSION: No active disease. Electronically Signed   By: Marijo Conception M.D.   On: 10/07/2019 18:26   DG Pelvis 1-2 Views  Result Date: 10/07/2019 CLINICAL DATA:  Fall today. EXAM: PELVIS - 1-2 VIEW COMPARISON:  January 27, 2007. FINDINGS: There is no evidence of pelvic fracture or diastasis. No pelvic bone lesions are seen. IMPRESSION: Negative. Electronically Signed   By: Marijo Conception M.D.   On: 10/07/2019 18:30   DG Shoulder Right  Result Date: 10/07/2019 CLINICAL DATA:  Bilateral shoulder pain after fall today. EXAM: RIGHT SHOULDER - 2+ VIEW COMPARISON:  February 21, 2011. FINDINGS: There is no evidence of fracture or dislocation. Severe degenerative changes seen involving the right glenohumeral joint. Soft tissues are unremarkable.  IMPRESSION: Severe degenerative joint disease of the right glenohumeral joint. No acute abnormality seen in the right shoulder. Electronically Signed   By: Marijo Conception M.D.   On: 10/07/2019 18:28   CT Head Wo Contrast  Result Date: 10/07/2019 CLINICAL DATA:  Unwitnessed fall. Complains of left shoulder injury. EXAM: CT HEAD WITHOUT CONTRAST CT CERVICAL SPINE WITHOUT CONTRAST TECHNIQUE: Multidetector CT imaging of the head and cervical spine was performed following the standard protocol without intravenous contrast. Multiplanar CT image reconstructions of the cervical spine were also generated. COMPARISON:  10/25/2017 FINDINGS: CT HEAD FINDINGS Brain: No evidence of acute infarction, hemorrhage, hydrocephalus, extra-axial collection or mass lesion/mass effect. There is mild diffuse low-attenuation within the subcortical and periventricular white matter compatible with chronic microvascular disease. Chronic left basal ganglia lacunar infarct noted. Vascular: No hyperdense vessel or unexpected calcification. Skull: Normal. Negative for fracture or focal lesion. Sinuses/Orbits: No acute finding. Other: None. CT CERVICAL SPINE FINDINGS Alignment: Normal. Skull base and vertebrae: No acute fracture. No primary bone lesion or focal pathologic process. Soft tissues and spinal canal: No prevertebral fluid or swelling. No visible canal hematoma. Disc levels: Marked multi level disc space narrowing and endplate spurring is identified. Upper chest: Negative. Other: None IMPRESSION: 1. No acute intracranial abnormalities. Chronic small vessel ischemic change and brain atrophy. 2. No evidence for cervical spine fracture. 3. Advanced cervical degenerative disc disease. Electronically Signed   By: Kerby Moors M.D.   On: 10/07/2019 18:40   CT Cervical Spine Wo Contrast  Result Date: 10/07/2019 CLINICAL DATA:  Unwitnessed fall. Complains of left shoulder injury. EXAM: CT HEAD WITHOUT CONTRAST CT CERVICAL SPINE WITHOUT  CONTRAST TECHNIQUE: Multidetector CT imaging of the head and cervical spine was performed following the standard protocol without intravenous contrast. Multiplanar CT image reconstructions of the cervical spine were also generated. COMPARISON:  10/25/2017 FINDINGS: CT HEAD FINDINGS Brain: No evidence of acute  infarction, hemorrhage, hydrocephalus, extra-axial collection or mass lesion/mass effect. There is mild diffuse low-attenuation within the subcortical and periventricular white matter compatible with chronic microvascular disease. Chronic left basal ganglia lacunar infarct noted. Vascular: No hyperdense vessel or unexpected calcification. Skull: Normal. Negative for fracture or focal lesion. Sinuses/Orbits: No acute finding. Other: None. CT CERVICAL SPINE FINDINGS Alignment: Normal. Skull base and vertebrae: No acute fracture. No primary bone lesion or focal pathologic process. Soft tissues and spinal canal: No prevertebral fluid or swelling. No visible canal hematoma. Disc levels: Marked multi level disc space narrowing and endplate spurring is identified. Upper chest: Negative. Other: None IMPRESSION: 1. No acute intracranial abnormalities. Chronic small vessel ischemic change and brain atrophy. 2. No evidence for cervical spine fracture. 3. Advanced cervical degenerative disc disease. Electronically Signed   By: Kerby Moors M.D.   On: 10/07/2019 18:40   DG Shoulder Left  Result Date: 10/07/2019 CLINICAL DATA:  Left shoulder pain after fall. EXAM: LEFT SHOULDER - 2+ VIEW COMPARISON:  December 18, 2007. FINDINGS: There is no evidence of fracture or dislocation. Mild degenerative changes are seen involving the left glenohumeral joint. Soft tissues are unremarkable. IMPRESSION: Mild degenerative joint disease of the left glenohumeral joint. No acute abnormality is noted. Electronically Signed   By: Marijo Conception M.D.   On: 10/07/2019 18:27   DG Humerus Left  Result Date: 10/07/2019 CLINICAL DATA:   86 year old with pain after fall.  Unwitnessed fall. EXAM: LEFT HUMERUS - 2+ VIEW COMPARISON:  Shoulder radiograph earlier this day. FINDINGS: The proximal humerus is better assessed on recent shoulder x-rays. Distal humerus is intact. Elbow alignment is maintained. No focal soft tissue abnormality. IMPRESSION: No fracture of the left humerus, proximal humerus better assessed on shoulder radiograph earlier today. Electronically Signed   By: Keith Rake M.D.   On: 10/07/2019 20:07    Procedures Procedures (including critical care time)  Medications Ordered in ED Medications  fentaNYL (SUBLIMAZE) injection 50 mcg (50 mcg Intravenous Given 10/07/19 1837)  sodium chloride 0.9 % bolus 500 mL (0 mLs Intravenous Stopped 10/07/19 2005)  sodium chloride 0.9 % bolus 1,000 mL (1,000 mLs Intravenous New Bag/Given 10/07/19 2007)    ED Course  I have reviewed the triage vital signs and the nursing notes.  Pertinent labs & imaging results that were available during my care of the patient were reviewed by me and considered in my medical decision making (see chart for details).    MDM Rules/Calculators/A&P                         patient presents from [facility] manner for evaluation of left shoulder pain. History is inconsistent from patient and from facility. She does have swelling and tenderness to the left shoulder without any overlying erythema.  Presentation is not c/w septic joint.  No evidence of acute fracture on imaging. Labs are significant for hyperkalemia, AKI. She was treated with IV fluid hydration. Initial potassium with hemolysis, will send repeat potassium. Hospitalist consulted for admission for ongoing treatment.  Final Clinical Impression(s) / ED Diagnoses Final diagnoses:  AKI (acute kidney injury) ([place])  Hyperkalemia  Pain and swelling of left shoulder    Rx / DC Orders ED Discharge Orders    None       Quintella Reichert, MD 10/07/19 2036

## 2019-10-08 ENCOUNTER — Other Ambulatory Visit: Payer: Self-pay

## 2019-10-08 DIAGNOSIS — N179 Acute kidney failure, unspecified: Secondary | ICD-10-CM | POA: Diagnosis not present

## 2019-10-08 DIAGNOSIS — F0390 Unspecified dementia without behavioral disturbance: Secondary | ICD-10-CM

## 2019-10-08 DIAGNOSIS — I160 Hypertensive urgency: Secondary | ICD-10-CM | POA: Diagnosis not present

## 2019-10-08 DIAGNOSIS — E875 Hyperkalemia: Secondary | ICD-10-CM | POA: Diagnosis not present

## 2019-10-08 LAB — CBC
HCT: 26.1 % — ABNORMAL LOW (ref 36.0–46.0)
Hemoglobin: 9 g/dL — ABNORMAL LOW (ref 12.0–15.0)
MCH: 34.6 pg — ABNORMAL HIGH (ref 26.0–34.0)
MCHC: 34.5 g/dL (ref 30.0–36.0)
MCV: 100.4 fL — ABNORMAL HIGH (ref 80.0–100.0)
Platelets: 212 10*3/uL (ref 150–400)
RBC: 2.6 MIL/uL — ABNORMAL LOW (ref 3.87–5.11)
RDW: 13.2 % (ref 11.5–15.5)
WBC: 3.4 10*3/uL — ABNORMAL LOW (ref 4.0–10.5)
nRBC: 0 % (ref 0.0–0.2)

## 2019-10-08 LAB — DIFFERENTIAL
Abs Immature Granulocytes: 0.01 10*3/uL (ref 0.00–0.07)
Basophils Absolute: 0 10*3/uL (ref 0.0–0.1)
Basophils Relative: 0 %
Eosinophils Absolute: 0 10*3/uL (ref 0.0–0.5)
Eosinophils Relative: 1 %
Immature Granulocytes: 0 %
Lymphocytes Relative: 33 %
Lymphs Abs: 1.1 10*3/uL (ref 0.7–4.0)
Monocytes Absolute: 0.3 10*3/uL (ref 0.1–1.0)
Monocytes Relative: 9 %
Neutro Abs: 1.9 10*3/uL (ref 1.7–7.7)
Neutrophils Relative %: 57 %

## 2019-10-08 LAB — BASIC METABOLIC PANEL
Anion gap: 10 (ref 5–15)
BUN: 39 mg/dL — ABNORMAL HIGH (ref 8–23)
CO2: 22 mmol/L (ref 22–32)
Calcium: 8.8 mg/dL — ABNORMAL LOW (ref 8.9–10.3)
Chloride: 102 mmol/L (ref 98–111)
Creatinine, Ser: 1.47 mg/dL — ABNORMAL HIGH (ref 0.44–1.00)
GFR calc Af Amer: 37 mL/min — ABNORMAL LOW (ref >60)
GFR calc non Af Amer: 32 mL/min — ABNORMAL LOW (ref >60)
Glucose, Bld: 82 mg/dL (ref 70–99)
Potassium: 4 mmol/L (ref 3.5–5.1)
Sodium: 134 mmol/L — ABNORMAL LOW (ref 135–145)

## 2019-10-08 LAB — CBG MONITORING, ED: Glucose-Capillary: 63 mg/dL — ABNORMAL LOW (ref 70–99)

## 2019-10-08 LAB — GLUCOSE, CAPILLARY
Glucose-Capillary: 93 mg/dL (ref 70–99)
Glucose-Capillary: 104 mg/dL — ABNORMAL HIGH (ref 70–99)
Glucose-Capillary: 99 mg/dL (ref 70–99)
Glucose-Capillary: 105 mg/dL — ABNORMAL HIGH (ref 70–99)

## 2019-10-08 NOTE — ED Notes (Signed)
Attempted to call report x 1  

## 2019-10-08 NOTE — ED Notes (Signed)
Attempted to call report x2

## 2019-10-08 NOTE — ED Notes (Signed)
Pts son concerned that pts face is "puffy". Contacted admitted MD. Advised to stop fluids and monitor.

## 2019-10-08 NOTE — ED Notes (Signed)
Pt has on a brief. Unable to get urine from PT at this time.

## 2019-10-08 NOTE — Consult Note (Addendum)
[facility] Nurse Consult Note: Reason for Consult: Bilateral feet with hammer toe deformity, small ulcerations noted at elevated portion of toe where it comes in contact with protective shoewear Wound type: trauma Pressure Injury POA: N/A Measurement: 0.4cm round with depth obscured by the presence of dried serum (scabbing) Wound bed:See above Drainage (amount, consistency, odor) None Periwound:intact, dry Dressing procedure/placement/frequency: Patient's son is instructed on the importance of shoes with a high toe box to avoid the repeated trauma to these lesions and that follow up with a podiatrist can be considered if the lesions worsen. Indicates understanding. Topical care guidance provided for nursing to apply a betadine swabstick twice daily to the lesions to act as an astringent and an antimicrobial.  [facility] nursing team will not follow, but will remain available to this patient, the nursing and medical teams.  Please re-consult if needed. Thanks, Maudie Flakes, MSN, RN, [place], [facility]  Pager# [phone]

## 2019-10-08 NOTE — Progress Notes (Signed)
PROGRESS NOTE    [name]  SWF:[id number] DOB: 1932-08-31 DOA: 10/07/2019 PCP: Lynnell Catalan, FNP   Brief Narrative: [name] is a 86 y.o. female with a history of diabetes, dementia, CKD, hypertension, deafness. Patient presented secondary to left shoulder swelling with evidence of shoulder effusion. Also found to have AKI. IV fluids started.   Assessment & Plan:   Principal Problem:   ARF (acute renal failure) (HCC) Active Problems:   Hyperkalemia   Swelling of joint of left shoulder   Hypertensive urgency   DM type 2 (diabetes mellitus, type 2) (HCC)   Dementia (HCC)   Acute renal failure (ARF) (HCC)   AKI on CKD stage IIIb Baseline creatinine of 1.2 from two years prior. Creatinine of 1.8 on admission. Given IV fluids overnight and is trended back to baseline -Continue IV fluids  Left shoulder complex fluid Afebrile. Leukocytopenia. No pain noted on exam. Orthopedic surgery consulted and imaging suggests significant arthritis with associated effusion. No concern for septic joint. Recommendations for outpatient follow-up -Outpatient orthopedic surgery follow-up -If patient unable to participate with PT/OT, will consult IR for aspiration/injection  Hypertensive urgency Improved with resumption of home medications -Continue Coreg, Clonidine  Diabetes mellitus, type 2 Patient is on metformine as an outpatient. -Continue SSI  Hyperkalemia Patient treated with insulin, Lokelma, calcium gluconate on admission. Potassium down to 4. Likely secondary to AKI -BMP in AM  Leukopenia Mild. -Obtain differential  Dementia -Continue Aricept and Lexapro  Glaucoma -Continue timolol and brimonidine  Hyperlipidemia -Continue pravastatin  Iron deficiency -Continue iron supplementation with ferrous sulfate 325 mg daily  Lip smacking Appears patient likely has tardive dyskinesia.   DVT prophylaxis: SCDs Code Status:   Code Status: Full Code Family  Communication: Son on telephone Disposition Plan: Discharge back to ALF   Consultants:   Orthopedic surgery (phone)  Procedures:   None  Antimicrobials:  None    Subjective: Used video ASL interpreter to attempt interview which was unsuccessful.    Objective: Vitals:   10/08/19 0330 10/08/19 0400 10/08/19 0502 10/08/19 0630  BP: 139/75 (!) 150/82 (!) 161/87 (!) 167/81  Pulse: 63 70 71 60  Resp: (!) 26 18 20 12   Temp:      TempSrc:      SpO2: 100% 100% 100% 100%   No intake or output data in the 24 hours ending 10/08/19 0706 There were no vitals filed for this visit.  Examination:  General exam: Appears calm and comfortable  Respiratory system: Clear to auscultation. Respiratory effort normal. Cardiovascular system: S1 & S2 heard, RRR. No murmurs, rubs, gallops or clicks. Gastrointestinal system: Abdomen is nondistended, soft and nontender. No organomegaly or masses felt. Normal bowel sounds heard. Central nervous system: Alert. Lip smacking Musculoskeletal: No edema. No calf tenderness. Left shoulder without tenderness and with some mild warmth and no overlying erythema. Skin: No cyanosis. No rashes     Data Reviewed: I have personally reviewed following labs and imaging studies  CBC Lab Results  Component Value Date   WBC 3.4 (L) 10/08/2019   RBC 2.60 (L) 10/08/2019   HGB 9.0 (L) 10/08/2019   HCT 26.1 (L) 10/08/2019   MCV 100.4 (H) 10/08/2019   MCH 34.6 (H) 10/08/2019   PLT 212 10/08/2019   MCHC 34.5 10/08/2019   RDW 13.2 10/08/2019   LYMPHSABS 1.2 10/07/2019   MONOABS 0.2 10/07/2019   EOSABS 0.0 10/07/2019   BASOSABS 0.0 22/05/5425     Last metabolic panel Lab Results  Component  Value Date   NA 131 (L) 10/07/2019   K 6.1 (H) 10/07/2019   CL 99 10/07/2019   CO2 21 (L) 10/07/2019   BUN 42 (H) 10/07/2019   CREATININE 1.80 (H) 10/07/2019   GLUCOSE 88 10/07/2019   GFRNONAA 25 (L) 10/07/2019   GFRAA 29 (L) 10/07/2019   CALCIUM 9.2 10/07/2019     PHOS 3.3 12/23/2010   PROT 6.8 10/07/2019   ALBUMIN 3.6 10/07/2019   BILITOT 0.6 10/07/2019   ALKPHOS 53 10/07/2019   AST 30 10/07/2019   ALT 14 10/07/2019   ANIONGAP 11 10/07/2019    CBG (last 3)  Recent Labs    10/07/19 2212  GLUCAP 81     GFR: CrCl cannot be calculated (Unknown ideal weight.).  Coagulation Profile: No results for input(s): INR, PROTIME in the last 168 hours.  Recent Results (from the past 240 hour(s))  SARS Coronavirus 2 by RT PCR (hospital order, performed in [facility] hospital lab) Nasopharyngeal Nasopharyngeal Swab     Status: None   Collection Time: 10/07/19  9:24 PM   Specimen: Nasopharyngeal Swab  Result Value Ref Range Status   SARS Coronavirus 2 NEGATIVE NEGATIVE Final    Comment: (NOTE) SARS-CoV-2 target nucleic acids are NOT DETECTED.  The SARS-CoV-2 RNA is generally detectable in upper and lower respiratory specimens during the acute phase of infection. The lowest concentration of SARS-CoV-2 viral copies this assay can detect is 250 copies / mL. A negative result does not preclude SARS-CoV-2 infection and should not be used as the sole basis for treatment or other patient management decisions.  A negative result may occur with improper specimen collection / handling, submission of specimen other than nasopharyngeal swab, presence of viral mutation(s) within the areas targeted by this assay, and inadequate number of viral copies (<250 copies / mL). A negative result must be combined with clinical observations, patient history, and epidemiological information.  Fact Sheet for Patients:   StrictlyIdeas.no  Fact Sheet for Healthcare Providers: BankingDealers.co.za  This test is not yet approved or  cleared by the [place] FDA and has been authorized for detection and/or diagnosis of SARS-CoV-2 by FDA under an Emergency Use Authorization (EUA).  This EUA will remain in effect  (meaning this test can be used) for the duration of the COVID-19 declaration under Section 564(b)(1) of the Act, 21 U.S.C. section 360bbb-3(b)(1), unless the authorization is terminated or revoked sooner.  Performed at [facility], [place] [address] [place], [place] [address]         Radiology Studies: DG Chest 1 View  Result Date: 10/07/2019 CLINICAL DATA:  Fall. EXAM: CHEST  1 VIEW COMPARISON:  March 18, 2018. FINDINGS: Stable cardiomegaly. No pneumothorax or pleural effusion is noted. Both lungs are clear. The visualized skeletal structures are unremarkable. IMPRESSION: No active disease. Electronically Signed   By: Marijo Conception M.D.   On: 10/07/2019 18:26   DG Pelvis 1-2 Views  Result Date: 10/07/2019 CLINICAL DATA:  Fall today. EXAM: PELVIS - 1-2 VIEW COMPARISON:  January 27, 2007. FINDINGS: There is no evidence of pelvic fracture or diastasis. No pelvic bone lesions are seen. IMPRESSION: Negative. Electronically Signed   By: Marijo Conception M.D.   On: 10/07/2019 18:30   DG Shoulder Right  Result Date: 10/07/2019 CLINICAL DATA:  Bilateral shoulder pain after fall today. EXAM: RIGHT SHOULDER - 2+ VIEW COMPARISON:  February 21, 2011. FINDINGS: There is no evidence of fracture or dislocation. Severe degenerative changes seen  involving the right glenohumeral joint. Soft tissues are unremarkable. IMPRESSION: Severe degenerative joint disease of the right glenohumeral joint. No acute abnormality seen in the right shoulder. Electronically Signed   By: Marijo Conception M.D.   On: 10/07/2019 18:28   CT Head Wo Contrast  Result Date: 10/07/2019 CLINICAL DATA:  Unwitnessed fall. Complains of left shoulder injury. EXAM: CT HEAD WITHOUT CONTRAST CT CERVICAL SPINE WITHOUT CONTRAST TECHNIQUE: Multidetector CT imaging of the head and cervical spine was performed following the standard protocol without intravenous contrast. Multiplanar CT image reconstructions of the cervical  spine were also generated. COMPARISON:  10/25/2017 FINDINGS: CT HEAD FINDINGS Brain: No evidence of acute infarction, hemorrhage, hydrocephalus, extra-axial collection or mass lesion/mass effect. There is mild diffuse low-attenuation within the subcortical and periventricular white matter compatible with chronic microvascular disease. Chronic left basal ganglia lacunar infarct noted. Vascular: No hyperdense vessel or unexpected calcification. Skull: Normal. Negative for fracture or focal lesion. Sinuses/Orbits: No acute finding. Other: None. CT CERVICAL SPINE FINDINGS Alignment: Normal. Skull base and vertebrae: No acute fracture. No primary bone lesion or focal pathologic process. Soft tissues and spinal canal: No prevertebral fluid or swelling. No visible canal hematoma. Disc levels: Marked multi level disc space narrowing and endplate spurring is identified. Upper chest: Negative. Other: None IMPRESSION: 1. No acute intracranial abnormalities. Chronic small vessel ischemic change and brain atrophy. 2. No evidence for cervical spine fracture. 3. Advanced cervical degenerative disc disease. Electronically Signed   By: Kerby Moors M.D.   On: 10/07/2019 18:40   CT Cervical Spine Wo Contrast  Result Date: 10/07/2019 CLINICAL DATA:  Unwitnessed fall. Complains of left shoulder injury. EXAM: CT HEAD WITHOUT CONTRAST CT CERVICAL SPINE WITHOUT CONTRAST TECHNIQUE: Multidetector CT imaging of the head and cervical spine was performed following the standard protocol without intravenous contrast. Multiplanar CT image reconstructions of the cervical spine were also generated. COMPARISON:  10/25/2017 FINDINGS: CT HEAD FINDINGS Brain: No evidence of acute infarction, hemorrhage, hydrocephalus, extra-axial collection or mass lesion/mass effect. There is mild diffuse low-attenuation within the subcortical and periventricular white matter compatible with chronic microvascular disease. Chronic left basal ganglia lacunar  infarct noted. Vascular: No hyperdense vessel or unexpected calcification. Skull: Normal. Negative for fracture or focal lesion. Sinuses/Orbits: No acute finding. Other: None. CT CERVICAL SPINE FINDINGS Alignment: Normal. Skull base and vertebrae: No acute fracture. No primary bone lesion or focal pathologic process. Soft tissues and spinal canal: No prevertebral fluid or swelling. No visible canal hematoma. Disc levels: Marked multi level disc space narrowing and endplate spurring is identified. Upper chest: Negative. Other: None IMPRESSION: 1. No acute intracranial abnormalities. Chronic small vessel ischemic change and brain atrophy. 2. No evidence for cervical spine fracture. 3. Advanced cervical degenerative disc disease. Electronically Signed   By: Kerby Moors M.D.   On: 10/07/2019 18:40   CT HUMERUS LEFT WO CONTRAST  Result Date: 10/07/2019 CLINICAL DATA:  Left shoulder and upper arm pain after injury. Unwitnessed fall today. EXAM: CT OF THE UPPER LEFT EXTREMITY WITHOUT CONTRAST CT OF THE LEFT SHOULDER WITHOUT CONTRAST. TECHNIQUE: Multidetector CT imaging of the shoulder and upper left extremity was performed according to the standard protocol. COMPARISON:  Radiographs earlier today FINDINGS: Shoulder: Bones/Joint/Cartilage No acute fracture. Advanced glenohumeral osteoarthritis with joint space narrowing, spurring, subchondral cystic change. There is some bony fragmentation about the inferior aspect of the glenohumeral joint extending into the axillary recess. Subcortical irregularity cystic changes circumferentially involving the humeral head including the non articular surface. Heterogeneous joint  effusion extending into the axillary recess, incompletely assessed on this noncontrast exam. Chronic periosteal thickening versus bony fragmentation involving the subacromial scapula. Ligaments Suboptimally assessed by CT. Muscles and Tendons There is fatty atrophy of the rotator cuff musculature. Soft  tissues Complex joint effusion. Soft tissues are suboptimally assessed due to soft tissue atrophy. Query cachexia. Chronic small left pleural calcification in the lingula. No acute fracture of the left ribs. Humerus: Bones No acute fracture. Diffuse under mineralization. No evidence of focal lesion. Elbow alignment is maintained. Muscles and tendons No obvious intramuscular hematoma. Soft tissues Joint effusion better assessed on concurrent shoulder exam. Scattered soft tissue calcifications posteriorly that may be vascular. No obvious soft tissue hematoma. Skin laxity versus skin tear distally about the lateral aspect. IMPRESSION: 1. No acute fracture of the left shoulder or humerus. 2. Advanced glenohumeral osteoarthritis. 3. Large complex joint effusion. Subcortical irregularity involving the circumferential humeral head is likely related to chronic joint inflammation. This can be further assessed with MRI based on clinical concern, if patient is able to tolerate. Electronically Signed   By: Keith Rake M.D.   On: 10/07/2019 21:13   CT SHOULDER LEFT WO CONTRAST  Result Date: 10/07/2019 CLINICAL DATA:  Left shoulder and upper arm pain after injury. Unwitnessed fall today. EXAM: CT OF THE UPPER LEFT EXTREMITY WITHOUT CONTRAST CT OF THE LEFT SHOULDER WITHOUT CONTRAST. TECHNIQUE: Multidetector CT imaging of the shoulder and upper left extremity was performed according to the standard protocol. COMPARISON:  Radiographs earlier today FINDINGS: Shoulder: Bones/Joint/Cartilage No acute fracture. Advanced glenohumeral osteoarthritis with joint space narrowing, spurring, subchondral cystic change. There is some bony fragmentation about the inferior aspect of the glenohumeral joint extending into the axillary recess. Subcortical irregularity cystic changes circumferentially involving the humeral head including the non articular surface. Heterogeneous joint effusion extending into the axillary recess, incompletely  assessed on this noncontrast exam. Chronic periosteal thickening versus bony fragmentation involving the subacromial scapula. Ligaments Suboptimally assessed by CT. Muscles and Tendons There is fatty atrophy of the rotator cuff musculature. Soft tissues Complex joint effusion. Soft tissues are suboptimally assessed due to soft tissue atrophy. Query cachexia. Chronic small left pleural calcification in the lingula. No acute fracture of the left ribs. Humerus: Bones No acute fracture. Diffuse under mineralization. No evidence of focal lesion. Elbow alignment is maintained. Muscles and tendons No obvious intramuscular hematoma. Soft tissues Joint effusion better assessed on concurrent shoulder exam. Scattered soft tissue calcifications posteriorly that may be vascular. No obvious soft tissue hematoma. Skin laxity versus skin tear distally about the lateral aspect. IMPRESSION: 1. No acute fracture of the left shoulder or humerus. 2. Advanced glenohumeral osteoarthritis. 3. Large complex joint effusion. Subcortical irregularity involving the circumferential humeral head is likely related to chronic joint inflammation. This can be further assessed with MRI based on clinical concern, if patient is able to tolerate. Electronically Signed   By: Keith Rake M.D.   On: 10/07/2019 21:13   DG Shoulder Left  Result Date: 10/07/2019 CLINICAL DATA:  Left shoulder pain after fall. EXAM: LEFT SHOULDER - 2+ VIEW COMPARISON:  December 18, 2007. FINDINGS: There is no evidence of fracture or dislocation. Mild degenerative changes are seen involving the left glenohumeral joint. Soft tissues are unremarkable. IMPRESSION: Mild degenerative joint disease of the left glenohumeral joint. No acute abnormality is noted. Electronically Signed   By: Marijo Conception M.D.   On: 10/07/2019 18:27   DG Humerus Left  Result Date: 10/07/2019 CLINICAL DATA:  86 year old with  pain after fall.  Unwitnessed fall. EXAM: LEFT HUMERUS - 2+ VIEW  COMPARISON:  Shoulder radiograph earlier this day. FINDINGS: The proximal humerus is better assessed on recent shoulder x-rays. Distal humerus is intact. Elbow alignment is maintained. No focal soft tissue abnormality. IMPRESSION: No fracture of the left humerus, proximal humerus better assessed on shoulder radiograph earlier today. Electronically Signed   By: Keith Rake M.D.   On: 10/07/2019 20:07        Scheduled Meds: . aspirin  81 mg Oral Daily  . brimonidine  1 drop Both Eyes BID   And  . timolol  1 drop Both Eyes BID  . carvedilol  25 mg Oral BID  . cloNIDine  0.3 mg Oral Daily  . docusate sodium  100 mg Oral Daily  . donepezil  10 mg Oral QHS  . doxazosin  16 mg Oral QHS  . escitalopram  10 mg Oral Daily  . ferrous sulfate  325 mg Oral Daily  . insulin aspart  0-9 Units Subcutaneous TID WC  . mirtazapine  15 mg Oral QHS  . polyvinyl alcohol  1 drop Both Eyes BID  . pravastatin  10 mg Oral q1800  . traZODone  50 mg Oral QHS   Continuous Infusions: . sodium chloride Stopped (10/08/19 0649)     LOS: 0 days     Cordelia Poche, MD [facility] 10/08/2019, 7:06 AM  If 7PM-7AM, please contact night-coverage [email or web address]

## 2019-10-09 DIAGNOSIS — L89321 Pressure ulcer of left buttock, stage 1: Secondary | ICD-10-CM | POA: Diagnosis present

## 2019-10-09 DIAGNOSIS — E871 Hypo-osmolality and hyponatremia: Secondary | ICD-10-CM | POA: Diagnosis present

## 2019-10-09 DIAGNOSIS — H9190 Unspecified hearing loss, unspecified ear: Secondary | ICD-10-CM | POA: Diagnosis present

## 2019-10-09 DIAGNOSIS — R609 Edema, unspecified: Secondary | ICD-10-CM | POA: Diagnosis not present

## 2019-10-09 DIAGNOSIS — F0390 Unspecified dementia without behavioral disturbance: Secondary | ICD-10-CM | POA: Diagnosis present

## 2019-10-09 DIAGNOSIS — N179 Acute kidney failure, unspecified: Secondary | ICD-10-CM | POA: Diagnosis present

## 2019-10-09 DIAGNOSIS — E7800 Pure hypercholesterolemia, unspecified: Secondary | ICD-10-CM | POA: Diagnosis present

## 2019-10-09 DIAGNOSIS — J939 Pneumothorax, unspecified: Secondary | ICD-10-CM | POA: Diagnosis not present

## 2019-10-09 DIAGNOSIS — L8990 Pressure ulcer of unspecified site, unspecified stage: Secondary | ICD-10-CM | POA: Insufficient documentation

## 2019-10-09 DIAGNOSIS — D631 Anemia in chronic kidney disease: Secondary | ICD-10-CM | POA: Diagnosis present

## 2019-10-09 DIAGNOSIS — R4701 Aphasia: Secondary | ICD-10-CM | POA: Diagnosis not present

## 2019-10-09 DIAGNOSIS — R4182 Altered mental status, unspecified: Secondary | ICD-10-CM | POA: Diagnosis not present

## 2019-10-09 DIAGNOSIS — Z7401 Bed confinement status: Secondary | ICD-10-CM | POA: Diagnosis not present

## 2019-10-09 DIAGNOSIS — R531 Weakness: Secondary | ICD-10-CM | POA: Diagnosis not present

## 2019-10-09 DIAGNOSIS — E1122 Type 2 diabetes mellitus with diabetic chronic kidney disease: Secondary | ICD-10-CM | POA: Diagnosis present

## 2019-10-09 DIAGNOSIS — E785 Hyperlipidemia, unspecified: Secondary | ICD-10-CM | POA: Diagnosis present

## 2019-10-09 DIAGNOSIS — J90 Pleural effusion, not elsewhere classified: Secondary | ICD-10-CM | POA: Diagnosis present

## 2019-10-09 DIAGNOSIS — Z7982 Long term (current) use of aspirin: Secondary | ICD-10-CM | POA: Diagnosis not present

## 2019-10-09 DIAGNOSIS — E119 Type 2 diabetes mellitus without complications: Secondary | ICD-10-CM | POA: Diagnosis not present

## 2019-10-09 DIAGNOSIS — L89152 Pressure ulcer of sacral region, stage 2: Secondary | ICD-10-CM | POA: Diagnosis not present

## 2019-10-09 DIAGNOSIS — F329 Major depressive disorder, single episode, unspecified: Secondary | ICD-10-CM | POA: Diagnosis present

## 2019-10-09 DIAGNOSIS — M2550 Pain in unspecified joint: Secondary | ICD-10-CM | POA: Diagnosis not present

## 2019-10-09 DIAGNOSIS — J810 Acute pulmonary edema: Secondary | ICD-10-CM | POA: Diagnosis not present

## 2019-10-09 DIAGNOSIS — N289 Disorder of kidney and ureter, unspecified: Secondary | ICD-10-CM | POA: Diagnosis not present

## 2019-10-09 DIAGNOSIS — H93013 Transient ischemic deafness, bilateral: Secondary | ICD-10-CM | POA: Diagnosis not present

## 2019-10-09 DIAGNOSIS — Z8616 Personal history of COVID-19: Secondary | ICD-10-CM | POA: Diagnosis not present

## 2019-10-09 DIAGNOSIS — I129 Hypertensive chronic kidney disease with stage 1 through stage 4 chronic kidney disease, or unspecified chronic kidney disease: Secondary | ICD-10-CM | POA: Diagnosis present

## 2019-10-09 DIAGNOSIS — M1990 Unspecified osteoarthritis, unspecified site: Secondary | ICD-10-CM | POA: Diagnosis not present

## 2019-10-09 DIAGNOSIS — U071 COVID-19: Secondary | ICD-10-CM | POA: Diagnosis present

## 2019-10-09 DIAGNOSIS — D500 Iron deficiency anemia secondary to blood loss (chronic): Secondary | ICD-10-CM | POA: Diagnosis not present

## 2019-10-09 DIAGNOSIS — I10 Essential (primary) hypertension: Secondary | ICD-10-CM | POA: Diagnosis not present

## 2019-10-09 DIAGNOSIS — Z961 Presence of intraocular lens: Secondary | ICD-10-CM | POA: Diagnosis present

## 2019-10-09 DIAGNOSIS — D509 Iron deficiency anemia, unspecified: Secondary | ICD-10-CM | POA: Diagnosis present

## 2019-10-09 DIAGNOSIS — Z87891 Personal history of nicotine dependence: Secondary | ICD-10-CM | POA: Diagnosis not present

## 2019-10-09 DIAGNOSIS — J811 Chronic pulmonary edema: Secondary | ICD-10-CM | POA: Diagnosis not present

## 2019-10-09 DIAGNOSIS — Z7984 Long term (current) use of oral hypoglycemic drugs: Secondary | ICD-10-CM | POA: Diagnosis not present

## 2019-10-09 DIAGNOSIS — W19XXXA Unspecified fall, initial encounter: Secondary | ICD-10-CM | POA: Diagnosis present

## 2019-10-09 DIAGNOSIS — Z9842 Cataract extraction status, left eye: Secondary | ICD-10-CM | POA: Diagnosis not present

## 2019-10-09 DIAGNOSIS — N184 Chronic kidney disease, stage 4 (severe): Secondary | ICD-10-CM | POA: Diagnosis present

## 2019-10-09 DIAGNOSIS — M25512 Pain in left shoulder: Secondary | ICD-10-CM | POA: Diagnosis not present

## 2019-10-09 DIAGNOSIS — I517 Cardiomegaly: Secondary | ICD-10-CM | POA: Diagnosis not present

## 2019-10-09 DIAGNOSIS — E875 Hyperkalemia: Secondary | ICD-10-CM | POA: Diagnosis present

## 2019-10-09 DIAGNOSIS — J181 Lobar pneumonia, unspecified organism: Secondary | ICD-10-CM | POA: Diagnosis not present

## 2019-10-09 DIAGNOSIS — M19012 Primary osteoarthritis, left shoulder: Secondary | ICD-10-CM | POA: Diagnosis present

## 2019-10-09 DIAGNOSIS — I160 Hypertensive urgency: Secondary | ICD-10-CM | POA: Diagnosis present

## 2019-10-09 DIAGNOSIS — H409 Unspecified glaucoma: Secondary | ICD-10-CM | POA: Diagnosis present

## 2019-10-09 LAB — BASIC METABOLIC PANEL
Anion gap: 8 (ref 5–15)
BUN: 34 mg/dL — ABNORMAL HIGH (ref 8–23)
CO2: 22 mmol/L (ref 22–32)
Calcium: 8.7 mg/dL — ABNORMAL LOW (ref 8.9–10.3)
Chloride: 101 mmol/L (ref 98–111)
Creatinine, Ser: 1.25 mg/dL — ABNORMAL HIGH (ref 0.44–1.00)
GFR calc Af Amer: 45 mL/min — ABNORMAL LOW (ref >60)
GFR calc non Af Amer: 39 mL/min — ABNORMAL LOW (ref >60)
Glucose, Bld: 88 mg/dL (ref 70–99)
Potassium: 4.1 mmol/L (ref 3.5–5.1)
Sodium: 131 mmol/L — ABNORMAL LOW (ref 135–145)

## 2019-10-09 LAB — CBC
HCT: 29 % — ABNORMAL LOW (ref 36.0–46.0)
Hemoglobin: 9.6 g/dL — ABNORMAL LOW (ref 12.0–15.0)
MCH: 32.3 pg (ref 26.0–34.0)
MCHC: 33.1 g/dL (ref 30.0–36.0)
MCV: 97.6 fL (ref 80.0–100.0)
Platelets: 231 10*3/uL (ref 150–400)
RBC: 2.97 MIL/uL — ABNORMAL LOW (ref 3.87–5.11)
RDW: 13 % (ref 11.5–15.5)
WBC: 4 10*3/uL (ref 4.0–10.5)
nRBC: 0 % (ref 0.0–0.2)

## 2019-10-09 LAB — GLUCOSE, CAPILLARY
Glucose-Capillary: 79 mg/dL (ref 70–99)
Glucose-Capillary: 146 mg/dL — ABNORMAL HIGH (ref 70–99)
Glucose-Capillary: 103 mg/dL — ABNORMAL HIGH (ref 70–99)
Glucose-Capillary: 88 mg/dL (ref 70–99)

## 2019-10-09 MED ORDER — SENNOSIDES-DOCUSATE SODIUM 8.6-50 MG PO TABS
1.0000 | ORAL_TABLET | Freq: Every day | ORAL | Status: DC
  Administered 2019-10-09 – 2019-10-20 (×11): 1 via ORAL
  Filled 2019-10-09 (×11): qty 1

## 2019-10-09 MED ORDER — HYDROCODONE-ACETAMINOPHEN 5-325 MG PO TABS: 1.0000 | ORAL_TABLET | Freq: Four times a day (QID) | ORAL | Status: DC | PRN

## 2019-10-09 NOTE — Progress Notes (Signed)
Nutrition Brief Note  Patient identified on the Malnutrition Screening Tool (MST) Report  Wt Readings from Last 15 Encounters:  10/09/19 54.7 kg  05/04/17 59 kg  05/24/16 96.6 kg    Body mass index is 21.71 kg/m. Patient meets criteria for normal weight based on current BMI. Current weight is 120 lb and weight on 05/04/17 was 130 lb. This indicates 10 lb weight loss in 2.5 years.   Current diet order is 2 grams Na and patient eating well at meals, per son's report.  Patient is deaf and uses/requires ASL to communicate. Her son was at bedside and reports that patient has a good appetite with no recent changes. He states that during hospitalization patient eats breakfast, lunch, and dinner meals. She was eating lunch during RD visit. Son states that patient was dehydrated on admission so some concern about this.   Son denies any nutrition-related questions or concerns at this time.   Labs and medications reviewed.   No nutrition interventions warranted at this time. If nutrition issues arise, please consult RD.     Jarome Matin, MS, RD, LDN, CNSC Inpatient Clinical Dietitian RD pager # available in [place]  After hours/weekend pager # available in [facility]

## 2019-10-09 NOTE — Evaluation (Signed)
Occupational Therapy Evaluation Patient Details Name: [name] MRN: [id number] DOB: Sep 21, 1932 Today's Date: 10/09/2019    History of Present Illness 86 yo female admitted with ARF, L shoulder pain 2* effusion/OA. Hx of deafness, dementia, DM, ulcers, syncopal episodes, ETOH abuse, CKD   Clinical Impression   [name] is an 86 year old woman admitted to hospital with ARF. On evaluation patient presents with generalized weakness, decreased ROM and strength of left upper extremity limiting functional use with ADLs and handling walker, decreased activity tolerance and complaints of pain resulting in decreased ability to ambulate and perform ADLs at baseline. Patient will benefit from skilled OT services to improve deficits and return to PLOF.     Follow Up Recommendations  Home health OT;SNF    Equipment Recommendations   (TBD)    Recommendations for Other Services       Precautions / Restrictions Precautions Precautions: Fall Precaution Comments: L shoulder pain Restrictions Weight Bearing Restrictions: No      Mobility Bed Mobility Overal bed mobility: Needs Assistance Bed Mobility: Supine to Sit     Supine to sit: Min assist;HOB elevated     General bed mobility comments: Patient seated in recliner when therapist entered the room. Patient mod assist to return to supine - needing assistance for lower extremities.  Transfers Overall transfer level: Needs assistance Equipment used: Rolling walker (2 wheeled) Transfers: Sit to/from Stand Sit to Stand: Min assist         General transfer comment: Min assist with RW and verbal cues for hand hand placement for sit to stand. patinet able to ambulate approx 8 feet in room. Limited by L shoulder pain while advancing RW    Balance Overall balance assessment: Mild deficits observed, not formally tested         Standing balance support: Bilateral upper extremity supported Standing balance-Leahy Scale:  Poor                             ADL either performed or assessed with clinical judgement   ADL Overall ADL's : Needs assistance/impaired Eating/Feeding: Set up;Sitting   Grooming: Set up;Sitting   Upper Body Bathing: Minimal assistance;Set up;Sitting   Lower Body Bathing: Moderate assistance;Set up;Sit to/from stand   Upper Body Dressing : Moderate assistance;Sitting   Lower Body Dressing: Moderate assistance;Set up;Sit to/from stand Lower Body Dressing Details (indicate cue type and reason): Patient able to donn socks seated in chair, limited by lack of LUE use secondary to pan. Toilet Transfer: Minimal assistance;BSC;Stand-pivot;RW   Toileting- Water quality scientist and Hygiene: Moderate assistance;Sit to/from Nurse, children's Details (indicate cue type and reason): n/a Functional mobility during ADLs: Minimal assistance;Rolling walker General ADL Comments: limited mobility secondary to pani in L shoulder.     Vision Baseline Vision/History: Wears glasses Wears Glasses: At all times Patient Visual Report: No change from baseline       Perception     Praxis      Pertinent Vitals/Pain Pain Assessment: Faces Faces Pain Scale: Hurts even more Pain Location: left shoulder Pain Descriptors / Indicators: Discomfort;Sore;Grimacing (rubbing shoulder) Pain Intervention(s): Monitored during session;Limited activity within patient's tolerance     Hand Dominance     Extremity/Trunk Assessment Upper Extremity Assessment Upper Extremity Assessment: LUE deficits/detail;RUE deficits/detail RUE Deficits / Details: grossly functional ROM and strength of RUE LUE Deficits / Details: Decreased shoulder ROM - approx 60 degrees secondary to pain, 4/5  elbow strength - limited by referred pain.   Lower Extremity Assessment Lower Extremity Assessment: Generalized weakness   Cervical / Trunk Assessment Cervical / Trunk Assessment: Kyphotic   Communication      Cognition Arousal/Alertness: Awake/alert Behavior During Therapy: WFL for tasks assessed/performed Overall Cognitive Status: Difficult to assess                                 General Comments: son present to assist with communicating with pt   General Comments       Exercises     Shoulder Instructions      Home Living                                          Prior Functioning/Environment                   OT Problem List: Decreased strength;Decreased range of motion;Decreased activity tolerance;Impaired balance (sitting and/or standing);Decreased safety awareness;Decreased cognition;Impaired UE functional use;Pain      OT Treatment/Interventions: Self-care/ADL training;Therapeutic exercise;DME and/or AE instruction;Therapeutic activities;Balance training;Patient/family education    OT Goals(Current goals can be found in the care plan section) Acute Rehab OT Goals Patient Stated Goal: To improve independence OT Goal Formulation: With family Time For Goal Achievement: 10/23/19 Potential to Achieve Goals: Good  OT Frequency: Min 2X/week   Barriers to D/C:            Co-evaluation              AM-PAC OT "6 Clicks" Daily Activity     Outcome Measure Help from another person eating meals?: A Little Help from another person taking care of personal grooming?: A Little Help from another person toileting, which includes using toliet, bedpan, or urinal?: A Lot Help from another person bathing (including washing, rinsing, drying)?: A Lot Help from another person to put on and taking off regular upper body clothing?: A Lot Help from another person to put on and taking off regular lower body clothing?: A Lot 6 Click Score: 14   End of Session Equipment Utilized During Treatment: Gait belt;Rolling walker Nurse Communication:  (okay to see)  Activity Tolerance: Patient limited by pain Patient left: in bed;with call bell/phone  within reach;with bed alarm set;with family/visitor present  OT Visit Diagnosis: Unsteadiness on feet (R26.81);Muscle weakness (generalized) (M62.81);Pain Pain - Right/Left: Left Pain - part of body: Shoulder                Time: 1500-1520 OT Time Calculation (min): 20 min Charges:  OT General Charges $OT Visit: 1 Visit OT Evaluation $OT Eval Moderate Complexity: 1 Mod  Kayly Kriegel, OTR/L [place]  Office [phone] Pager: [phone]   Lenward Chancellor 10/09/2019, 5:15 PM

## 2019-10-09 NOTE — Evaluation (Signed)
Physical Therapy Evaluation Patient Details Name: [name] MRN: [id number] DOB: March 06, 1933 Today's Date: 10/09/2019   History of Present Illness  86 yo female admitted with ARF, L shoulder pain 2* effusion/OA. Hx of deafness, dementia, DM, ulcers, syncopal episodes, ETOH abuse, CKD  Clinical Impression  On eval, pt required Mod assist +2 for safety/equipment. She walked ~7 feet with a RW. Mobility is limited by weakness and L UE pain. Son was present during session to assist with communicating with pt (sign language). He expressed concerns about pt returning to the ALF she currently lives in. At baseline, pt was modified independent per son. Currently, she is requiring significant assistance to mobilize. Son is agreeable to ST SNF rehab. Will continue to follow and progress activity during hospital stay.     Follow Up Recommendations SNF    Equipment Recommendations  None recommended by PT    Recommendations for Other Services       Precautions / Restrictions Precautions Precautions: Fall Precaution Comments: L shoulder pain Restrictions Weight Bearing Restrictions: No      Mobility  Bed Mobility Overal bed mobility: Needs Assistance Bed Mobility: Supine to Sit     Supine to sit: Min assist;HOB elevated     General bed mobility comments: Assist for trunk. Increased time.  Transfers Overall transfer level: Needs assistance Equipment used: Rolling walker (2 wheeled) Transfers: Sit to/from Stand Sit to Stand: Mod assist;+2 safety/equipment;From elevated surface         General transfer comment: Assist to power up, stabilize, control descent. Wide BOS. Unsteady. Pt not really wanting to use L UE until standing. Then she was able to place it on the RW handle.  Ambulation/Gait Ambulation/Gait assistance: Min assist;+2 safety/equipment Gait Distance (Feet): 7 Feet Assistive device: Rolling walker (2 wheeled) Gait Pattern/deviations: Step-through pattern;Decreased  stride length     General Gait Details: Unsteady. Assist to stabilize pt and maneuver RW throughout distance.  Stairs            Wheelchair Mobility    Modified Rankin (Stroke Patients Only)       Balance Overall balance assessment: Needs assistance;History of Falls         Standing balance support: Bilateral upper extremity supported Standing balance-Leahy Scale: Poor                               Pertinent Vitals/Pain Pain Assessment: Faces Faces Pain Scale: Hurts little more Pain Location: L UE Pain Descriptors / Indicators: Discomfort;Sore;Grimacing Pain Intervention(s): Limited activity within patient's tolerance;Monitored during session;Repositioned    Home Living Family/patient expects to be discharged to:: Unsure     Type of Home: Assisted living [facility]) Home Access: Level entry     Home Layout: One level Home Equipment: Walker - 2 wheels      Prior Function Level of Independence: Independent with assistive device(s)         Comments: per son, pt was modified independent. He stated pt did not receive much assistance from ALF staff.     Hand Dominance        Extremity/Trunk Assessment   Upper Extremity Assessment Upper Extremity Assessment: Defer to OT evaluation    Lower Extremity Assessment Lower Extremity Assessment: Generalized weakness    Cervical / Trunk Assessment Cervical / Trunk Assessment: Kyphotic  Communication   Communication: Deaf  Cognition Arousal/Alertness: Awake/alert Behavior During Therapy: WFL for tasks assessed/performed Overall Cognitive Status: Difficult to assess  General Comments: son present to assist with communicating with pt      General Comments      Exercises     Assessment/Plan    PT Assessment Patient needs continued PT services  PT Problem List Decreased strength;Decreased mobility;Decreased range of motion;Decreased  activity tolerance;Decreased balance;Decreased knowledge of use of DME;Pain;Decreased skin integrity       PT Treatment Interventions DME instruction;Gait training;Therapeutic activities;Therapeutic exercise;Patient/family education;Balance training;Functional mobility training    PT Goals (Current goals can be found in the Care Plan section)  Acute Rehab PT Goals Patient Stated Goal: per son, for pt to live at a different ALF PT Goal Formulation: With family Time For Goal Achievement: 10/23/19 Potential to Achieve Goals: Good    Frequency Min 3X/week   Barriers to discharge        Co-evaluation               AM-PAC PT "6 Clicks" Mobility  Outcome Measure Help needed turning from your back to your side while in a flat bed without using bedrails?: A Little Help needed moving from lying on your back to sitting on the side of a flat bed without using bedrails?: A Little Help needed moving to and from a bed to a chair (including a wheelchair)?: A Little Help needed standing up from a chair using your arms (e.g., wheelchair or bedside chair)?: A Lot Help needed to walk in hospital room?: A Little Help needed climbing 3-5 steps with a railing? : A Lot 6 Click Score: 16    End of Session Equipment Utilized During Treatment: Gait belt Activity Tolerance: Patient tolerated treatment well Patient left: in chair;with call bell/phone within reach;with chair alarm set;with family/visitor present   PT Visit Diagnosis: Muscle weakness (generalized) (M62.81);Pain;History of falling (Z91.81) Pain - Right/Left: Left Pain - part of body: Shoulder    Time: 3428-7681 PT Time Calculation (min) (ACUTE ONLY): 18 min   Charges:   PT Evaluation $PT Eval Low Complexity: [place], PT Acute Rehabilitation  Office: [phone] Pager: [phone]

## 2019-10-09 NOTE — Progress Notes (Signed)
Radiology called that they wont be able to do the procedure today but will do tomorrow  morning. MD notified and son updated at bedside.

## 2019-10-09 NOTE — Progress Notes (Addendum)
PROGRESS NOTE    VELERIA BARNHARDT  KCL:[id number] DOB: May 18, 1932 DOA: 10/07/2019 PCP: Lynnell Catalan, FNP   Brief Narrative: [name] is a 86 y.o. female with a history of diabetes, dementia, CKD, hypertension, deafness. Patient presented secondary to left shoulder swelling with evidence of shoulder effusion. Also found to have AKI. IV fluids started.   Assessment & Plan:   Principal Problem:   ARF (acute renal failure) (HCC) Active Problems:   Hyperkalemia   Swelling of joint of left shoulder   Hypertensive urgency   DM type 2 (diabetes mellitus, type 2) (HCC)   Dementia (HCC)   Acute renal failure (ARF) (HCC)   Pressure injury of skin   AKI (acute kidney injury) ([place])   AKI on CKD stage IIIb Baseline creatinine of 1.2 from two years prior. Creatinine of 1.8 on admission. Given IV fluids overnight and is trended back to baseline -Continue IV fluids  Left shoulder complex fluid Afebrile. Leukocytopenia. No pain noted on exam. Orthopedic surgery consulted and imaging suggests significant arthritis with associated effusion. No concern for septic joint. Recommendations for outpatient follow-up -Outpatient orthopedic surgery follow-up -Due to severe pain will consult IR for intra-articular joint aspiration as well as steroid injection in her left shoulder.  Procedure scheduled on 10/10/2019  Hypertensive urgency Improved with resumption of home medications -Continue Coreg, Clonidine  Diabetes mellitus, type 2 Patient is on metformine as an outpatient. -Continue SSI  Hyperkalemia Patient treated with insulin, Lokelma, calcium gluconate on admission. Potassium down to 4. Likely secondary to AKI -BMP in AM   Leukopenia Mild. -Obtain differential  Dementia -Continue Aricept and Lexapro  Glaucoma -Continue timolol and brimonidine  Hyperlipidemia -Continue pravastatin  Iron deficiency -Continue iron supplementation with ferrous sulfate 325 mg daily  Lip  smacking Appears patient likely has tardive dyskinesia.  Hospice patient. Patient is active with hospice monitor.  Focusing more on symptom control.  Plan is to going back to ALF with hospice   DVT prophylaxis: SCDs Code Status:   Code Status: Full Code Family Communication: Son on telephone Disposition Plan: Discharge back to ALF   Consultants:   Orthopedic surgery (phone)  Procedures:   None  Antimicrobials:  None    Subjective: Son at bedside.  Denies any acute complaint other than shoulder pain.  No nausea no vomiting but no fever no chills.  Minimal oral intake.  Objective: Vitals:   10/09/19 0226 10/09/19 0500 10/09/19 0524 10/09/19 1303  BP: (!) 193/119  (!) 180/98 (!) 152/97  Pulse: 63  69 (!) 58  Resp: 18  15 19   Temp: 98.1 F (36.7 C)  97.8 F (36.6 C) (!) 97.5 F (36.4 C)  TempSrc: Oral  Oral Oral  SpO2: 100%  99% 100%  Weight:  54.7 kg      Intake/Output Summary (Last 24 hours) at 10/09/2019 2008 Last data filed at 10/09/2019 1831 Gross per 24 hour  Intake --  Output 700 ml  Net -700 ml   Filed Weights   10/09/19 0500  Weight: 54.7 kg    Examination:  General exam: Appears calm and comfortable  Respiratory system: Clear to auscultation. Respiratory effort normal. Cardiovascular system: S1 & S2 heard, RRR. No murmurs, rubs, gallops or clicks. Gastrointestinal system: Abdomen is nondistended, soft and nontender. No organomegaly or masses felt. Normal bowel sounds heard. Central nervous system: Alert. Lip smacking Musculoskeletal: No edema. No calf tenderness. Left shoulder without tenderness and with some mild warmth and no overlying erythema. Skin: No cyanosis.  No rashes     Data Reviewed: I have personally reviewed following labs and imaging studies  CBC Lab Results  Component Value Date   WBC 4.0 10/09/2019   RBC 2.97 (L) 10/09/2019   HGB 9.6 (L) 10/09/2019   HCT 29.0 (L) 10/09/2019   MCV 97.6 10/09/2019   MCH 32.3 10/09/2019    PLT 231 10/09/2019   MCHC 33.1 10/09/2019   RDW 13.0 10/09/2019   LYMPHSABS 1.1 10/08/2019   MONOABS 0.3 10/08/2019   EOSABS 0.0 10/08/2019   BASOSABS 0.0 98/92/1194     Last metabolic panel Lab Results  Component Value Date   NA 131 (L) 10/09/2019   K 4.1 10/09/2019   CL 101 10/09/2019   CO2 22 10/09/2019   BUN 34 (H) 10/09/2019   CREATININE 1.25 (H) 10/09/2019   GLUCOSE 88 10/09/2019   GFRNONAA 39 (L) 10/09/2019   GFRAA 45 (L) 10/09/2019   CALCIUM 8.7 (L) 10/09/2019   PHOS 3.3 12/23/2010   PROT 6.8 10/07/2019   ALBUMIN 3.6 10/07/2019   BILITOT 0.6 10/07/2019   ALKPHOS 53 10/07/2019   AST 30 10/07/2019   ALT 14 10/07/2019   ANIONGAP 8 10/09/2019    CBG (last 3)  Recent Labs    10/09/19 0746 10/09/19 1234 10/09/19 1633  GLUCAP 79 146* 103*     GFR: CrCl cannot be calculated (Unknown ideal weight.).  Coagulation Profile: No results for input(s): INR, PROTIME in the last 168 hours.  Recent Results (from the past 240 hour(s))  SARS Coronavirus 2 by RT PCR (hospital order, performed in [facility] hospital lab) Nasopharyngeal Nasopharyngeal Swab     Status: None   Collection Time: 10/07/19  9:24 PM   Specimen: Nasopharyngeal Swab  Result Value Ref Range Status   SARS Coronavirus 2 NEGATIVE NEGATIVE Final    Comment: (NOTE) SARS-CoV-2 target nucleic acids are NOT DETECTED.  The SARS-CoV-2 RNA is generally detectable in upper and lower respiratory specimens during the acute phase of infection. The lowest concentration of SARS-CoV-2 viral copies this assay can detect is 250 copies / mL. A negative result does not preclude SARS-CoV-2 infection and should not be used as the sole basis for treatment or other patient management decisions.  A negative result may occur with improper specimen collection / handling, submission of specimen other than nasopharyngeal swab, presence of viral mutation(s) within the areas targeted by this assay, and inadequate number of  viral copies (<250 copies / mL). A negative result must be combined with clinical observations, patient history, and epidemiological information.  Fact Sheet for Patients:   StrictlyIdeas.no  Fact Sheet for Healthcare Providers: BankingDealers.co.za  This test is not yet approved or  cleared by the [place] FDA and has been authorized for detection and/or diagnosis of SARS-CoV-2 by FDA under an Emergency Use Authorization (EUA).  This EUA will remain in effect (meaning this test can be used) for the duration of the COVID-19 declaration under Section 564(b)(1) of the Act, 21 U.S.C. section 360bbb-3(b)(1), unless the authorization is terminated or revoked sooner.  Performed at [facility], [place] [address] [place], [place] [address]         Radiology Studies: CT HUMERUS LEFT WO CONTRAST  Result Date: 10/07/2019 CLINICAL DATA:  Left shoulder and upper arm pain after injury. Unwitnessed fall today. EXAM: CT OF THE UPPER LEFT EXTREMITY WITHOUT CONTRAST CT OF THE LEFT SHOULDER WITHOUT CONTRAST. TECHNIQUE: Multidetector CT imaging of the shoulder and upper left extremity was performed according  to the standard protocol. COMPARISON:  Radiographs earlier today FINDINGS: Shoulder: Bones/Joint/Cartilage No acute fracture. Advanced glenohumeral osteoarthritis with joint space narrowing, spurring, subchondral cystic change. There is some bony fragmentation about the inferior aspect of the glenohumeral joint extending into the axillary recess. Subcortical irregularity cystic changes circumferentially involving the humeral head including the non articular surface. Heterogeneous joint effusion extending into the axillary recess, incompletely assessed on this noncontrast exam. Chronic periosteal thickening versus bony fragmentation involving the subacromial scapula. Ligaments Suboptimally assessed by CT. Muscles and Tendons There is  fatty atrophy of the rotator cuff musculature. Soft tissues Complex joint effusion. Soft tissues are suboptimally assessed due to soft tissue atrophy. Query cachexia. Chronic small left pleural calcification in the lingula. No acute fracture of the left ribs. Humerus: Bones No acute fracture. Diffuse under mineralization. No evidence of focal lesion. Elbow alignment is maintained. Muscles and tendons No obvious intramuscular hematoma. Soft tissues Joint effusion better assessed on concurrent shoulder exam. Scattered soft tissue calcifications posteriorly that may be vascular. No obvious soft tissue hematoma. Skin laxity versus skin tear distally about the lateral aspect. IMPRESSION: 1. No acute fracture of the left shoulder or humerus. 2. Advanced glenohumeral osteoarthritis. 3. Large complex joint effusion. Subcortical irregularity involving the circumferential humeral head is likely related to chronic joint inflammation. This can be further assessed with MRI based on clinical concern, if patient is able to tolerate. Electronically Signed   By: Keith Rake M.D.   On: 10/07/2019 21:13   CT SHOULDER LEFT WO CONTRAST  Result Date: 10/07/2019 CLINICAL DATA:  Left shoulder and upper arm pain after injury. Unwitnessed fall today. EXAM: CT OF THE UPPER LEFT EXTREMITY WITHOUT CONTRAST CT OF THE LEFT SHOULDER WITHOUT CONTRAST. TECHNIQUE: Multidetector CT imaging of the shoulder and upper left extremity was performed according to the standard protocol. COMPARISON:  Radiographs earlier today FINDINGS: Shoulder: Bones/Joint/Cartilage No acute fracture. Advanced glenohumeral osteoarthritis with joint space narrowing, spurring, subchondral cystic change. There is some bony fragmentation about the inferior aspect of the glenohumeral joint extending into the axillary recess. Subcortical irregularity cystic changes circumferentially involving the humeral head including the non articular surface. Heterogeneous joint  effusion extending into the axillary recess, incompletely assessed on this noncontrast exam. Chronic periosteal thickening versus bony fragmentation involving the subacromial scapula. Ligaments Suboptimally assessed by CT. Muscles and Tendons There is fatty atrophy of the rotator cuff musculature. Soft tissues Complex joint effusion. Soft tissues are suboptimally assessed due to soft tissue atrophy. Query cachexia. Chronic small left pleural calcification in the lingula. No acute fracture of the left ribs. Humerus: Bones No acute fracture. Diffuse under mineralization. No evidence of focal lesion. Elbow alignment is maintained. Muscles and tendons No obvious intramuscular hematoma. Soft tissues Joint effusion better assessed on concurrent shoulder exam. Scattered soft tissue calcifications posteriorly that may be vascular. No obvious soft tissue hematoma. Skin laxity versus skin tear distally about the lateral aspect. IMPRESSION: 1. No acute fracture of the left shoulder or humerus. 2. Advanced glenohumeral osteoarthritis. 3. Large complex joint effusion. Subcortical irregularity involving the circumferential humeral head is likely related to chronic joint inflammation. This can be further assessed with MRI based on clinical concern, if patient is able to tolerate. Electronically Signed   By: Keith Rake M.D.   On: 10/07/2019 21:13        Scheduled Meds: . aspirin  81 mg Oral Daily  . brimonidine  1 drop Both Eyes BID   And  . timolol  1 drop Both  Eyes BID  . carvedilol  25 mg Oral BID  . cloNIDine  0.3 mg Oral Daily  . donepezil  10 mg Oral QHS  . doxazosin  16 mg Oral QHS  . escitalopram  10 mg Oral Daily  . ferrous sulfate  325 mg Oral Daily  . insulin aspart  0-9 Units Subcutaneous TID WC  . mirtazapine  15 mg Oral QHS  . polyvinyl alcohol  1 drop Both Eyes BID  . pravastatin  10 mg Oral q1800  . senna-docusate  1 tablet Oral QHS  . traZODone  50 mg Oral QHS   Continuous  Infusions:    LOS: 0 days     Berle Mull  [facility] 10/09/2019, 8:08 PM  If 7PM-7AM, please contact night-coverage [email or web address]

## 2019-10-10 ENCOUNTER — Inpatient Hospital Stay (HOSPITAL_COMMUNITY): Payer: Medicare Other

## 2019-10-10 LAB — RENAL FUNCTION PANEL
Albumin: 3.2 g/dL — ABNORMAL LOW (ref 3.5–5.0)
Anion gap: 7 (ref 5–15)
BUN: 39 mg/dL — ABNORMAL HIGH (ref 8–23)
CO2: 23 mmol/L (ref 22–32)
Calcium: 8.9 mg/dL (ref 8.9–10.3)
Chloride: 99 mmol/L (ref 98–111)
Creatinine, Ser: 1.56 mg/dL — ABNORMAL HIGH (ref 0.44–1.00)
GFR calc Af Amer: 35 mL/min — ABNORMAL LOW (ref >60)
GFR calc non Af Amer: 30 mL/min — ABNORMAL LOW (ref >60)
Glucose, Bld: 100 mg/dL — ABNORMAL HIGH (ref 70–99)
Phosphorus: 3 mg/dL (ref 2.5–4.6)
Potassium: 4.3 mmol/L (ref 3.5–5.1)
Sodium: 129 mmol/L — ABNORMAL LOW (ref 135–145)

## 2019-10-10 LAB — GLUCOSE, CAPILLARY
Glucose-Capillary: 107 mg/dL — ABNORMAL HIGH (ref 70–99)
Glucose-Capillary: 184 mg/dL — ABNORMAL HIGH (ref 70–99)
Glucose-Capillary: 108 mg/dL — ABNORMAL HIGH (ref 70–99)
Glucose-Capillary: 197 mg/dL — ABNORMAL HIGH (ref 70–99)

## 2019-10-10 MED ORDER — SODIUM CHLORIDE 0.9 % IV SOLN: INTRAVENOUS | Status: DC

## 2019-10-10 MED ORDER — LIDOCAINE HCL 1 % IJ SOLN
INTRAMUSCULAR | Status: AC
  Administered 2019-10-10: 5 mL
  Filled 2019-10-10: qty 20

## 2019-10-10 NOTE — TOC Progression Note (Signed)
Transition of Care [facility]) - Progression Note    Patient Details  Name: [name] MRN: [id number] Date of Birth: 29-Jun-1932  [facility] CM/SW Contact  Ross Ludwig, [place] Phone Number: 10/10/2019, 7:00 PM  Clinical Narrative:     CSW was informed that PT saw patient and is recommending SNF placement.  Patient was switched to inpatient status yesterday evening.  CSW contacted [facility] to inform them that patient has now been switched to inpatient status.  CSW reviewed patient's documents and found that patient had a Medicaid card in her records stating that she does have Medicaid.  CSW contacted [place], and she said the business office did verify that patient does have Medicaid.  CSW asked [facility] if patient's son decided to rescind her hospice benefits and she gets back on to Medicare would they be able to accept her.  [facility] stated yes once patient has had her qualifying stay then patient can come on her Medicare benefits if son agrees to rescind hospice benefits.   CSW contacted patient's son, and explained the situation, he is in agreement to revoking hospice benefits.  CSW contacted Esther Hardy at [facility] and he will have the office contact patient's son to rescind hospice benefits so she can go back to Medicare.  CSW asked to be notified once patient's son has signed the paperwork rescinding the hospice benefits.  CSW updated physician, that patient's son would like her to go to [facility] for short term rehab, and the facility can assist with transitioning to long term care.  CSW contacted Chief Financial Officer at [facility], [phone] and informed her that patient's son is going to try to have patient go to SNF under her Medicare benefits and rescind hospice services.  April requested that she be notified once it has determined that patient can go to SNF.  CSW to continue to follow patient's progress throughout discharge  planning.  Expected Discharge Plan: [place] Barriers to Discharge: Continued Medical Work up  Expected Discharge Plan and Services Expected Discharge Plan: [place] In-house Referral: Clinical Social Work   Post Acute Care Choice: [place] Living arrangements for the past 2 months: [place] ([facility])                   DME Agency: NA       HH Arranged: NA           Social Determinants of Health (SDOH) Interventions    Readmission Risk Interventions No flowsheet data found.

## 2019-10-10 NOTE — Plan of Care (Signed)
  Problem: Clinical Measurements: Goal: Diagnostic test results will improve Outcome: Progressing   Problem: Nutrition: Goal: Adequate nutrition will be maintained Outcome: Progressing   Problem: Pain Managment: Goal: General experience of comfort will improve Outcome: Progressing   

## 2019-10-10 NOTE — TOC Initial Note (Signed)
Transition of Care [facility]) - Initial/Assessment Note    Patient Details  Name: [name] MRN: [id number] Date of Birth: 06-11-1932  Transition of Care [facility]) CM/SW Contact:    Ross Ludwig, LCSW Phone Number: 10/10/2019, 6:40 PM  Clinical Narrative:                  Patient is an 86 year old female from [facility] and has [facility] followiing.  CSW contacted [facility] who reported patient, has only been open to them for abbout 3 weeeks.  Patient is receiving a cna three times a week and nurse two times a week for wound care.  CSW spoke to Esther Hardy at [facility] [phone] to discuss patient's discharge plan.  Patient can receive increase CNA support if needed.    CSW spoke to patient's son who stated he is trying to get patient to [facility] as a long term care patient, due to her increase in needs of care.  CSW explained barriers that CSW is facing in trying to get patient placed at SNF from the hospital.  CSW informed him that since she is followed by hospice, she does not have any SNF benefits available.  CSW also informed him that she is on observation, and patient would have to have a 3 day stay as inpatient in order to be able to use Medicare benefits and also he would have to revoke hospice benefits so patient can be switched back to Medicare.  Patient's son stated that he was trying to make sure patient's Medicaid will be in place for her too so she can transfer to a SNF under Medicaid benefits.  Patient's son is working with DSS and [facility] [place] to try to complete Medicaid paperwork.  He said patient did have Medicaid at one time and some how she lost it.  CSW will check with SNF to see if they can verify if she still has Medicaid.  CSW to continue to follow patient's progress throughout discharge plan.  At this time plan is to discharge back to [facility] if they are able to accept her back.                    Expected Discharge Plan: Skilled Nursing  Facility Barriers to Discharge: Continued Medical Work up   Patient Goals and CMS Choice Patient states their goals for this hospitalization and ongoing recovery are:: To go to SNF for short term rehab, then transition to long term care. CMS Medicare.gov Compare Post Acute Care list provided to:: Patient Represenative (must comment) Choice offered to / list presented to : Adult Children  Expected Discharge Plan and Services Expected Discharge Plan: [place] In-house Referral: Clinical Social Work   Post Acute Care Choice: [place] Living arrangements for the past 2 months: [place] ([facility])                   DME Agency: NA       HH Arranged: NA          Prior Living Arrangements/Services Living arrangements for the past 2 months: [place] ([facility]) Lives with:: Facility Resident Patient language and need for interpreter reviewed:: Yes Do you feel safe going back to the place where you live?: No   ALF feels patient needs a higher level of care, as does the patient's son.  Need for Family Participation in Patient Care: Yes (Comment)  Care giver support system in place?: Yes (comment) Current home services: Hospice Criminal Activity/Legal Involvement Pertinent to Current Situation/Hospitalization: No - Comment as needed  Activities of Daily Living Home Assistive Devices/Equipment: Eyeglasses, Wheelchair ADL Screening (condition at time of admission) Patient's cognitive ability adequate to safely complete daily activities?: No Is the patient deaf or have difficulty hearing?: Yes Does the patient have difficulty seeing, even when wearing glasses/contacts?: No Does the patient have difficulty concentrating, remembering, or making decisions?: Yes (has dementia) Patient able to express need for assistance with ADLs?: Yes Does the patient have difficulty dressing or bathing?: Yes Independently performs  ADLs?: No Communication: Needs assistance (is mute) Is this a change from baseline?: Pre-admission baseline Dressing (OT): Needs assistance Is this a change from baseline?: Pre-admission baseline Grooming: Needs assistance Is this a change from baseline?: Pre-admission baseline Feeding: Needs assistance Is this a change from baseline?: Pre-admission baseline Bathing: Needs assistance Is this a change from baseline?: Pre-admission baseline Toileting: Needs assistance Is this a change from baseline?: Pre-admission baseline In/Out Bed: Needs assistance Is this a change from baseline?: Pre-admission baseline Walks in Home: Needs assistance (uses walker) Is this a change from baseline?: Pre-admission baseline Does the patient have difficulty walking or climbing stairs?: Yes Weakness of Legs: Left Weakness of Arms/Hands: None  Permission Sought/Granted Permission sought to share information with : Case Manager, Customer service manager, Family Supports, Psychiatrist Permission granted to share information with : Yes, Verbal Permission Granted  Share Information with NAME: Kalyn, [place] [phone]  [phone]  Permission granted to share info w AGENCY: [facility]        Emotional Assessment Appearance:: Appears stated age   Affect (typically observed): Accepting, Appropriate, Calm Orientation: : Oriented to Self Alcohol / Substance Use: Not Applicable Psych Involvement: No (comment)  Admission diagnosis:  Hyperkalemia [E87.5] Acute renal failure (ARF) (HCC) [N17.9] AKI (acute kidney injury) ([place]) [N17.9] Pain and swelling of left shoulder [M25.512, M25.412] Patient Active Problem List   Diagnosis Date Noted  . Pressure injury of skin 10/09/2019  . AKI (acute kidney injury) ([place]) 10/09/2019  . ARF (acute renal failure) ([place]) 10/07/2019  . Hyperkalemia 10/07/2019  . Swelling of joint of left shoulder 10/07/2019  . Hypertensive urgency 10/07/2019  . DM  type 2 (diabetes mellitus, type 2) ([place]) 10/07/2019  . Dementia ([place]) 10/07/2019  . Acute renal failure (ARF) ([place]) 10/07/2019   PCP:  Lynnell Catalan, [place] Pharmacy:  No Pharmacies Listed    Social Determinants of Health (SDOH) Interventions    Readmission Risk Interventions No flowsheet data found.

## 2019-10-10 NOTE — Progress Notes (Signed)
[facility] Progress Note  Patient: [name]    QQP:[id number]  DOA: 10/07/2019     Date of Service: the patient was seen and examined on 10/10/2019  Chief Complaint  Patient presents with  . Fall  . Shoulder Injury   Brief hospital course: Past medical history of type II DM, dementia, CKD 3b, HTN, deafness.  Presents with complaints of left shoulder pain and AKI Currently plan is treat AKI with IV fluids.  Assessment and Plan: 1. Acute kidney injury on chronic renal disease stage IIIb. Hyperkalemia Hyponatremia Treating with IV fluids. Serum creatinine baseline 1.8. Currently significant elevated as well as potassium level Continue to monitor with IV fluids. Avoid nephrotoxic medications.  2.  Left shoulder complex fluid Osteoarthritis Patient afebrile.  Significant pain on examination. Orthopedic was consulted also recommended outpatient follow-up. Due to patient's persistent symptoms IR was consulted and 30 cc of fluid drained. Currently examination is not consistent with septic arthritis still reduced arthritis. Pain control.  3.  Hypertensive urgency Due to pain. Continue home medication.  4.  Type 2 diabetes mellitus, controlled On Metformin.  Currently on hold.  Continue sliding scale insulin.  5.  Dementia. Continue Aricept and Lexapro.  6.  HLD Continue statin  7.  Hospice patient. Patient with hospice.  Currently family planning to revoke hospice and transition to treat what is treatable with full code.  They would like patient to go to rehab and get therapy.  Monitor.   Pressure Injury 10/08/19 Buttocks Right;Left Stage 1 -  Intact skin with non-blanchable redness of a localized area usually over a bony prominence. (Active)  10/08/19 1300  Location: Buttocks  Location Orientation: Right;Left  Staging: Stage 1 -  Intact skin with non-blanchable redness of a localized area usually over a bony prominence.  Wound Description (Comments):     Present on Admission: Yes     Diet: Cardiac diet DVT Prophylaxis: Subcutaneous Heparin    Advance goals of care discussion: Full code  Family Communication: no family was present at bedside, at the time of interview.   Disposition:  Status is: Inpatient  Remains inpatient appropriate because:Ongoing active pain requiring inpatient pain management, Altered mental status and Unsafe d/c plan   Dispo: The patient is from: ALF              Anticipated d/c is to: SNF              Anticipated d/c date is: 3 days              Patient currently is not medically stable to d/c.  Due to AKI  Subjective: Continues to have minimal oral intake.  No nausea no vomiting but no fever no chills.  Examination not possible due to patient's lack of cooperation with interpreter and family not available.  Unfortunately the left before I arrived to evaluate the patient.  Physical Exam:   Vitals:   10/10/19 0600 10/10/19 0637 10/10/19 0957 10/10/19 1301  BP:  (!) 153/82 (!) 153/101 110/63  Pulse:  83 89 63  Resp:   18 (!) 22  Temp:   97.6 F (36.4 C) 97.6 F (36.4 C)  TempSrc:   Oral Oral  SpO2:   100% 100%  Weight: 55.4 kg       Intake/Output Summary (Last 24 hours) at 10/10/2019 1944 Last data filed at 10/10/2019 1800 Gross per 24 hour  Intake 1103.64 ml  Output 300 ml  Net 803.64 ml   Danley Danker  Weights   10/09/19 0500 10/10/19 0600  Weight: 54.7 kg 55.4 kg    Data Reviewed: I have personally reviewed and interpreted daily labs, tele strips, imagings as discussed above. I reviewed all nursing notes, pharmacy notes, vitals, pertinent old records I have discussed plan of care as described above with RN and patient/family.  CBC: Recent Labs  Lab 10/07/19 1802 10/08/19 0649 10/09/19 0441  WBC 3.9* 3.4* 4.0  NEUTROABS 2.5 1.9  --   HGB 10.1* 9.0* 9.6*  HCT 31.3* 26.1* 29.0*  MCV 102.3* 100.4* 97.6  PLT 226 212 161   Basic Metabolic Panel: Recent Labs  Lab 10/07/19 1802  10/07/19 2004 10/08/19 0649 10/09/19 0441 10/10/19 0810  NA 131*  --  134* 131* 129*  K 6.5* 6.1* 4.0 4.1 4.3  CL 99  --  102 101 99  CO2 21*  --  22 22 23   GLUCOSE 88  --  82 88 100*  BUN 42*  --  39* 34* 39*  CREATININE 1.80*  --  1.47* 1.25* 1.56*  CALCIUM 9.2  --  8.8* 8.7* 8.9  MG  --  1.7  --   --   --   PHOS  --   --   --   --  3.0    Studies: DG FLUORO GUIDED NEEDLE PLC ASPIRATION/INJECTION LOC  Result Date: 10/10/2019 CLINICAL DATA:  Left shoulder pain EXAM: Left shoulder aspiration UNDER FLUOROSCOPY FLUOROSCOPY TIME:  Fluoroscopy Time:  0 minutes 24 seconds Radiation Exposure Index (if provided by the fluoroscopic device): Number of Acquired Spot Images: 0 PROCEDURE: After obtaining informed written consent, the procedure was performed. The patient's son was available for sign language assistance. Skin prep with Betadine. 2% lidocaine for local anesthesia. Review of the prior CT from 10/07/2019 demonstrates a large joint effusion. Using fluoroscopic guidance, a 22 gauge spinal needle was placed into the large joint effusion overlying the humeral head. Bloody joint fluid was aspirated. A total of 31 mL of bloody joint fluid was obtained. 6 mL was sent to the laboratory for evaluation. No immediate complication. IMPRESSION: Successful left shoulder aspiration. 31 mL of bloody joint fluid obtained from the left shoulder. Electronically Signed   By: Franchot Gallo M.D.   On: 10/10/2019 10:20    Scheduled Meds: . aspirin  81 mg Oral Daily  . brimonidine  1 drop Both Eyes BID   And  . timolol  1 drop Both Eyes BID  . carvedilol  25 mg Oral BID  . cloNIDine  0.3 mg Oral Daily  . donepezil  10 mg Oral QHS  . doxazosin  16 mg Oral QHS  . escitalopram  10 mg Oral Daily  . ferrous sulfate  325 mg Oral Daily  . insulin aspart  0-9 Units Subcutaneous TID WC  . mirtazapine  15 mg Oral QHS  . polyvinyl alcohol  1 drop Both Eyes BID  . pravastatin  10 mg Oral q1800  . senna-docusate  1  tablet Oral QHS  . traZODone  50 mg Oral QHS   Continuous Infusions: . sodium chloride 75 mL/hr at 10/10/19 1522   PRN Meds: hydrALAZINE, HYDROcodone-acetaminophen, ondansetron **OR** ondansetron (ZOFRAN) IV  Time spent: 35 minutes  Author: Berle Mull, MD [facility] 10/10/2019 7:44 PM  To reach On-call, see care teams to locate the attending and reach out via [email or web address]. Between 7PM-7AM, please contact night-coverage If you still have difficulty reaching the attending provider, please page the [facility] (Director on Call) for  [facility] on amion for assistance.

## 2019-10-10 NOTE — NC FL2 (Signed)
[place] LEVEL OF CARE SCREENING TOOL     IDENTIFICATION  Patient Name: [name] Birthdate: Dec 29, 1932 Sex: female Admission Date (Current Location): 10/07/2019  [place] and [place] Number:  Kathleen Argue [id number] Facility and Address:  [facility],  [place], [place]      Provider Number: [id number]  Attending Physician Name and Address:  Lavina Hamman, MD  Relative Name and Phone Number:  Kathie, [place] [phone]  [phone]    Current Level of Care: Hospital Recommended Level of Care: [place] Prior Approval Number:    Date Approved/Denied:   PASRR Number: [id number]  Discharge Plan: SNF    Current Diagnoses: Patient Active Problem List   Diagnosis Date Noted  . Pressure injury of skin 10/09/2019  . AKI (acute kidney injury) ([place]) 10/09/2019  . ARF (acute renal failure) ([place]) 10/07/2019  . Hyperkalemia 10/07/2019  . Swelling of joint of left shoulder 10/07/2019  . Hypertensive urgency 10/07/2019  . DM type 2 (diabetes mellitus, type 2) ([place]) 10/07/2019  . Dementia ([place]) 10/07/2019  . Acute renal failure (ARF) ([place]) 10/07/2019    Orientation RESPIRATION BLADDER Height & Weight     Self, Time, Situation, Place  Normal Incontinent Weight: 122 lb 2.2 oz (55.4 kg) Height:     BEHAVIORAL SYMPTOMS/MOOD NEUROLOGICAL BOWEL NUTRITION STATUS      Continent Diet (2G sodium diet)  AMBULATORY STATUS COMMUNICATION OF NEEDS Skin   Limited Assist Non-Verbally (Patient uses sign language) PU Stage and Appropriate Care PU Stage 1 Dressing:  (PRN dressing change)                     Personal Care Assistance Level of Assistance  Bathing, Feeding, Dressing Bathing Assistance: Limited assistance Feeding assistance: Independent Dressing Assistance: Limited assistance     Functional Limitations Info  Sight, Hearing, Speech Sight Info: Adequate Hearing Info: Impaired (Patient is deaf) Speech  Info: Impaired (Patient is deaf uses sign language)    [place]  PT (By licensed PT), OT (By licensed OT)     PT Frequency: Minimum 5x a week OT Frequency: Minimum 5x a week            Contractures Contractures Info: Not present    Additional Factors Info  Code Status, Allergies, Psychotropic, Insulin Sliding Scale Code Status Info: Full Code Allergies Info: NKA Psychotropic Info: escitalopram (LEXAPRO) tablet 10 mg and mirtazapine (REMERON) tablet 15 mg Insulin Sliding Scale Info: insulin aspart (novoLOG) injection 0-9 Units 3x a day with meals       Current Medications (10/10/2019):  This is the current hospital active medication list Current Facility-Administered Medications  Medication Dose Route Frequency Provider Last Rate Last Admin  . aspirin chewable tablet 81 mg  81 mg Oral Daily Rise Patience, MD   81 mg at 10/10/19 0951  . brimonidine (ALPHAGAN) 0.2 % ophthalmic solution 1 drop  1 drop Both Eyes BID Rise Patience, MD   1 drop at 10/10/19 0953   And  . timolol (TIMOPTIC) 0.5 % ophthalmic solution 1 drop  1 drop Both Eyes BID Rise Patience, MD   1 drop at 10/10/19 0950  . carvedilol (COREG) tablet 25 mg  25 mg Oral BID Rise Patience, MD   25 mg at 10/10/19 0951  . cloNIDine (CATAPRES) tablet 0.3 mg  0.3 mg Oral Daily Rise Patience, MD   0.3 mg at 10/10/19 0951  . donepezil (  ARICEPT) tablet 10 mg  10 mg Oral QHS Rise Patience, MD   10 mg at 10/09/19 2105  . doxazosin (CARDURA) tablet 16 mg  16 mg Oral QHS Rise Patience, MD   16 mg at 10/09/19 2106  . escitalopram (LEXAPRO) tablet 10 mg  10 mg Oral Daily Rise Patience, MD   10 mg at 10/10/19 0951  . ferrous sulfate tablet 325 mg  325 mg Oral Daily Rise Patience, MD   325 mg at 10/10/19 0951  . hydrALAZINE (APRESOLINE) injection 10 mg  10 mg Intravenous Q4H PRN Rise Patience, MD   10 mg at 10/10/19 0542  . HYDROcodone-acetaminophen  (NORCO/VICODIN) 5-325 MG per tablet 1 tablet  1 tablet Oral Q6H PRN Lavina Hamman, MD      . insulin aspart (novoLOG) injection 0-9 Units  0-9 Units Subcutaneous TID WC Rise Patience, MD   1 Units at 10/09/19 1300  . mirtazapine (REMERON) tablet 15 mg  15 mg Oral QHS Rise Patience, MD   15 mg at 10/09/19 2106  . ondansetron (ZOFRAN) tablet 4 mg  4 mg Oral Q6H PRN Rise Patience, MD       Or  . ondansetron [facility]) injection 4 mg  4 mg Intravenous Q6H PRN Rise Patience, MD      . polyvinyl alcohol (LIQUIFILM TEARS) 1.4 % ophthalmic solution 1 drop  1 drop Both Eyes BID Rise Patience, MD   1 drop at 10/10/19 0958  . pravastatin (PRAVACHOL) tablet 10 mg  10 mg Oral q1800 Rise Patience, MD   10 mg at 10/09/19 1735  . senna-docusate (Senokot-S) tablet 1 tablet  1 tablet Oral QHS Lavina Hamman, MD   1 tablet at 10/09/19 2105  . traZODone (DESYREL) tablet 50 mg  50 mg Oral QHS Rise Patience, MD   50 mg at 10/09/19 2106     Discharge Medications: Please see discharge summary for a list of discharge medications.  Relevant Imaging Results:  Relevant Lab Results:   Additional Information SSN [id number], Patient is deam and uses sign language.  Ross Ludwig, LCSW

## 2019-10-11 ENCOUNTER — Inpatient Hospital Stay (HOSPITAL_COMMUNITY): Payer: Medicare Other

## 2019-10-11 LAB — CBC
HCT: 25.8 % — ABNORMAL LOW (ref 36.0–46.0)
Hemoglobin: 8.8 g/dL — ABNORMAL LOW (ref 12.0–15.0)
MCH: 33.5 pg (ref 26.0–34.0)
MCHC: 34.1 g/dL (ref 30.0–36.0)
MCV: 98.1 fL (ref 80.0–100.0)
Platelets: 208 10*3/uL (ref 150–400)
RBC: 2.63 MIL/uL — ABNORMAL LOW (ref 3.87–5.11)
RDW: 13.1 % (ref 11.5–15.5)
WBC: 3.5 10*3/uL — ABNORMAL LOW (ref 4.0–10.5)
nRBC: 0 % (ref 0.0–0.2)

## 2019-10-11 LAB — RENAL FUNCTION PANEL
Albumin: 3.1 g/dL — ABNORMAL LOW (ref 3.5–5.0)
Anion gap: 6 (ref 5–15)
BUN: 44 mg/dL — ABNORMAL HIGH (ref 8–23)
CO2: 23 mmol/L (ref 22–32)
Calcium: 8.6 mg/dL — ABNORMAL LOW (ref 8.9–10.3)
Chloride: 99 mmol/L (ref 98–111)
Creatinine, Ser: 1.82 mg/dL — ABNORMAL HIGH (ref 0.44–1.00)
GFR calc Af Amer: 29 mL/min — ABNORMAL LOW (ref >60)
GFR calc non Af Amer: 25 mL/min — ABNORMAL LOW (ref >60)
Glucose, Bld: 97 mg/dL (ref 70–99)
Phosphorus: 3 mg/dL (ref 2.5–4.6)
Potassium: 4.4 mmol/L (ref 3.5–5.1)
Sodium: 128 mmol/L — ABNORMAL LOW (ref 135–145)

## 2019-10-11 LAB — BASIC METABOLIC PANEL
Anion gap: 10 (ref 5–15)
Anion gap: 6 (ref 5–15)
Anion gap: 7 (ref 5–15)
Anion gap: 9 (ref 5–15)
BUN: 43 mg/dL — ABNORMAL HIGH (ref 8–23)
BUN: 44 mg/dL — ABNORMAL HIGH (ref 8–23)
BUN: 42 mg/dL — ABNORMAL HIGH (ref 8–23)
BUN: 45 mg/dL — ABNORMAL HIGH (ref 8–23)
CO2: 20 mmol/L — ABNORMAL LOW (ref 22–32)
CO2: 21 mmol/L — ABNORMAL LOW (ref 22–32)
CO2: 22 mmol/L (ref 22–32)
CO2: 21 mmol/L — ABNORMAL LOW (ref 22–32)
Calcium: 8.7 mg/dL — ABNORMAL LOW (ref 8.9–10.3)
Calcium: 8.6 mg/dL — ABNORMAL LOW (ref 8.9–10.3)
Calcium: 8.5 mg/dL — ABNORMAL LOW (ref 8.9–10.3)
Calcium: 8.3 mg/dL — ABNORMAL LOW (ref 8.9–10.3)
Chloride: 99 mmol/L (ref 98–111)
Chloride: 101 mmol/L (ref 98–111)
Chloride: 99 mmol/L (ref 98–111)
Chloride: 98 mmol/L (ref 98–111)
Creatinine, Ser: 1.76 mg/dL — ABNORMAL HIGH (ref 0.44–1.00)
Creatinine, Ser: 1.65 mg/dL — ABNORMAL HIGH (ref 0.44–1.00)
Creatinine, Ser: 1.73 mg/dL — ABNORMAL HIGH (ref 0.44–1.00)
Creatinine, Ser: 1.84 mg/dL — ABNORMAL HIGH (ref 0.44–1.00)
GFR calc Af Amer: 30 mL/min — ABNORMAL LOW (ref >60)
GFR calc Af Amer: 32 mL/min — ABNORMAL LOW (ref >60)
GFR calc Af Amer: 31 mL/min — ABNORMAL LOW (ref >60)
GFR calc Af Amer: 28 mL/min — ABNORMAL LOW (ref >60)
GFR calc non Af Amer: 26 mL/min — ABNORMAL LOW (ref >60)
GFR calc non Af Amer: 28 mL/min — ABNORMAL LOW (ref >60)
GFR calc non Af Amer: 26 mL/min — ABNORMAL LOW (ref >60)
GFR calc non Af Amer: 25 mL/min — ABNORMAL LOW (ref >60)
Glucose, Bld: 100 mg/dL — ABNORMAL HIGH (ref 70–99)
Glucose, Bld: 102 mg/dL — ABNORMAL HIGH (ref 70–99)
Glucose, Bld: 158 mg/dL — ABNORMAL HIGH (ref 70–99)
Glucose, Bld: 171 mg/dL — ABNORMAL HIGH (ref 70–99)
Potassium: 4.3 mmol/L (ref 3.5–5.1)
Potassium: 5.4 mmol/L — ABNORMAL HIGH (ref 3.5–5.1)
Potassium: 4.7 mmol/L (ref 3.5–5.1)
Potassium: 4.9 mmol/L (ref 3.5–5.1)
Sodium: 129 mmol/L — ABNORMAL LOW (ref 135–145)
Sodium: 128 mmol/L — ABNORMAL LOW (ref 135–145)
Sodium: 128 mmol/L — ABNORMAL LOW (ref 135–145)
Sodium: 128 mmol/L — ABNORMAL LOW (ref 135–145)

## 2019-10-11 LAB — GLUCOSE, CAPILLARY
Glucose-Capillary: 93 mg/dL (ref 70–99)
Glucose-Capillary: 112 mg/dL — ABNORMAL HIGH (ref 70–99)
Glucose-Capillary: 138 mg/dL — ABNORMAL HIGH (ref 70–99)
Glucose-Capillary: 118 mg/dL — ABNORMAL HIGH (ref 70–99)

## 2019-10-11 LAB — MAGNESIUM: Magnesium: 1.8 mg/dL (ref 1.7–2.4)

## 2019-10-11 LAB — OSMOLALITY: Osmolality: 281 mOsm/kg (ref 275–295)

## 2019-10-11 NOTE — Progress Notes (Signed)
[facility] PROGRESS NOTE  Patient: [name] UCJ:[id number]   PCP: Lynnell Catalan, FNP DOB: Sep 05, 1932   DOA: 10/07/2019   DOS: 10/11/2019    Subjective: No acute events overnight.  Unable to perform review of system given patient was reluctance to use sign language via video and inability to contact son.  Objective:  Vitals:   10/11/19 0500 10/11/19 1143  BP: (!) 198/119 115/72  Pulse: 66 68  Resp: 20 17  Temp: 98.3 F (36.8 C) 97.8 F (36.6 C)  SpO2: 97% 100%    Unable to perform exam due to inability to communicate.  Assessment and plan: Acute kidney injury, hyponatremia. Renal function worsening but sodium improving. Ultrasound renal shows no evidence of obstruction.  Right pleural effusion. Chest x-ray performed Small effusion on the x-ray.  No indication for further work-up.  Left shoulder pain. SP IR guided drainage. Work-up so far negative for any acute abnormality. Continue pain control.  Author: Berle Mull, MD [facility] 10/11/2019 6:04 PM   If 7PM-7AM, please contact night-coverage at [email or web address]

## 2019-10-11 NOTE — Progress Notes (Signed)
Physical Therapy Treatment Patient Details Name: [name] MRN: [id number] DOB: Aug 31, 1932 Today's Date: 10/11/2019    History of Present Illness 86 yo female admitted with ARF, L shoulder pain 2* effusion/OA. Hx of deafness, dementia, DM, ulcers, syncopal episodes, ETOH abuse, CKD    PT Comments    Pt able to come to sitting EOB with min A to upright trunk. Pt c/o "a little" L shoulder pain with mobility, limiting ability to maneuver RW independently. Pt's son donned pt's shoes to improve balance prior to ambulation. Pt able to ambulate increased distance this session, demonstrating increased unsteadiness with distractions and turning requiring cues for attention to task and to continue walking. Pt tolerates remaining up in recliner with BLE elevated at EOS and begins to fall asleep before therapist exits room. Pt's son present during session providing assistance to communicate with pt. Patient will benefit from continued physical therapy in hospital and recommendations below to increase strength, balance, endurance for safe ADLs and gait.   Follow Up Recommendations  SNF     Equipment Recommendations  None recommended by PT    Recommendations for Other Services       Precautions / Restrictions Precautions Precautions: Fall Precaution Comments: L shoulder pain Restrictions Weight Bearing Restrictions: No    Mobility  Bed Mobility Overal bed mobility: Needs Assistance Bed Mobility: Supine to Sit  Supine to sit: Min assist;HOB elevated  General bed mobility comments: lots of cues, increased time, min A with HOB elevated to upright trunk  Transfers Overall transfer level: Needs assistance Equipment used: Rolling walker (2 wheeled) Transfers: Sit to/from [facility] Sit to Stand: Min assist Stand pivot transfers: Min assist  General transfer comment: min A to power up, cues to push through feet, RUE assisting with pushing from bed, limited LUE assist 2*  pain  Ambulation/Gait Ambulation/Gait assistance: Min assist Gait Distance (Feet): 20 Feet Assistive device: Rolling walker (2 wheeled) Gait Pattern/deviations: Step-through pattern;Decreased stride length;Trunk flexed (increased LLE external rotation) Gait velocity: decreased  General Gait Details: min A to steady and to assist in maneuvering RW, minimal LUE use on RW, easily distracted increasing unsteadiness and requiring attention to task, increased time with turns, limited 2* fatigue   Stairs             Wheelchair Mobility    Modified Rankin (Stroke Patients Only)       Balance Overall balance assessment: Needs assistance;History of Falls  Sitting balance-Leahy Scale: Fair Sitting balance - Comments: SUPV seated EOB  Standing balance support: Bilateral upper extremity supported;During functional activity Standing balance-Leahy Scale: Poor Standing balance comment: reliant on RW, min A with dynamic mobility             Cognition Arousal/Alertness: Awake/alert Behavior During Therapy: WFL for tasks assessed/performed Overall Cognitive Status: Difficult to assess  General Comments: son present to assist with communicating with pt      Exercises      General Comments        Pertinent Vitals/Pain Pain Assessment: 0-10 Pain Score:  ("a little bit") Pain Location: left shoulder Pain Descriptors / Indicators: Grimacing (rubs shoulder) Pain Intervention(s): Limited activity within patient's tolerance;Monitored during session;Repositioned    Home Living                      Prior Function            PT Goals (current goals can now be found in the care plan section) Acute Rehab  PT Goals Patient Stated Goal: per son, for pt to live at a different ALF PT Goal Formulation: With family Time For Goal Achievement: 10/23/19 Potential to Achieve Goals: Good Progress towards PT goals: Progressing toward goals    Frequency    Min 3X/week       PT Plan Current plan remains appropriate    Co-evaluation              AM-PAC PT "6 Clicks" Mobility   Outcome Measure  Help needed turning from your back to your side while in a flat bed without using bedrails?: A Little Help needed moving from lying on your back to sitting on the side of a flat bed without using bedrails?: A Little Help needed moving to and from a bed to a chair (including a wheelchair)?: A Little Help needed standing up from a chair using your arms (e.g., wheelchair or bedside chair)?: A Little Help needed to walk in hospital room?: A Little Help needed climbing 3-5 steps with a railing? : A Lot 6 Click Score: 17    End of Session Equipment Utilized During Treatment: Gait belt Activity Tolerance: Patient tolerated treatment well Patient left: in chair;with call bell/phone within reach;with chair alarm set;with family/visitor present Nurse Communication: Mobility status PT Visit Diagnosis: Muscle weakness (generalized) (M62.81);Pain;History of falling (Z91.81) Pain - Right/Left: Left Pain - part of body: Shoulder     Time: 3491-7915 PT Time Calculation (min) (ACUTE ONLY): 28 min  Charges:  $Gait Training: 8-22 mins $Therapeutic Activity: 8-22 mins                      Tori Sheela Mcculley PT, DPT 10/11/19, 4:02 PM

## 2019-10-12 LAB — GLUCOSE, CAPILLARY
Glucose-Capillary: 90 mg/dL (ref 70–99)
Glucose-Capillary: 92 mg/dL (ref 70–99)
Glucose-Capillary: 193 mg/dL — ABNORMAL HIGH (ref 70–99)
Glucose-Capillary: 119 mg/dL — ABNORMAL HIGH (ref 70–99)

## 2019-10-12 LAB — RENAL FUNCTION PANEL
Albumin: 2.8 g/dL — ABNORMAL LOW (ref 3.5–5.0)
Anion gap: 6 (ref 5–15)
BUN: 44 mg/dL — ABNORMAL HIGH (ref 8–23)
CO2: 22 mmol/L (ref 22–32)
Calcium: 8 mg/dL — ABNORMAL LOW (ref 8.9–10.3)
Chloride: 104 mmol/L (ref 98–111)
Creatinine, Ser: 1.6 mg/dL — ABNORMAL HIGH (ref 0.44–1.00)
GFR calc Af Amer: 34 mL/min — ABNORMAL LOW (ref >60)
GFR calc non Af Amer: 29 mL/min — ABNORMAL LOW (ref >60)
Glucose, Bld: 88 mg/dL (ref 70–99)
Phosphorus: 3 mg/dL (ref 2.5–4.6)
Potassium: 4.3 mmol/L (ref 3.5–5.1)
Sodium: 132 mmol/L — ABNORMAL LOW (ref 135–145)

## 2019-10-12 LAB — CBC WITH DIFFERENTIAL/PLATELET
Abs Immature Granulocytes: 0.01 10*3/uL (ref 0.00–0.07)
Basophils Absolute: 0 10*3/uL (ref 0.0–0.1)
Basophils Relative: 0 %
Eosinophils Absolute: 0.1 10*3/uL (ref 0.0–0.5)
Eosinophils Relative: 2 %
HCT: 25.7 % — ABNORMAL LOW (ref 36.0–46.0)
Hemoglobin: 8.5 g/dL — ABNORMAL LOW (ref 12.0–15.0)
Immature Granulocytes: 0 %
Lymphocytes Relative: 34 %
Lymphs Abs: 1.1 10*3/uL (ref 0.7–4.0)
MCH: 32.7 pg (ref 26.0–34.0)
MCHC: 33.1 g/dL (ref 30.0–36.0)
MCV: 98.8 fL (ref 80.0–100.0)
Monocytes Absolute: 0.3 10*3/uL (ref 0.1–1.0)
Monocytes Relative: 10 %
Neutro Abs: 1.6 10*3/uL — ABNORMAL LOW (ref 1.7–7.7)
Neutrophils Relative %: 54 %
Platelets: 229 10*3/uL (ref 150–400)
RBC: 2.6 MIL/uL — ABNORMAL LOW (ref 3.87–5.11)
RDW: 13.2 % (ref 11.5–15.5)
WBC: 3.1 10*3/uL — ABNORMAL LOW (ref 4.0–10.5)
nRBC: 0 % (ref 0.0–0.2)

## 2019-10-12 LAB — CBC
HCT: 23.5 % — ABNORMAL LOW (ref 36.0–46.0)
Hemoglobin: 7.8 g/dL — ABNORMAL LOW (ref 12.0–15.0)
MCH: 32.9 pg (ref 26.0–34.0)
MCHC: 33.2 g/dL (ref 30.0–36.0)
MCV: 99.2 fL (ref 80.0–100.0)
Platelets: 211 10*3/uL (ref 150–400)
RBC: 2.37 MIL/uL — ABNORMAL LOW (ref 3.87–5.11)
RDW: 13.3 % (ref 11.5–15.5)
WBC: 2.9 10*3/uL — ABNORMAL LOW (ref 4.0–10.5)
nRBC: 0 % (ref 0.0–0.2)

## 2019-10-12 LAB — RETICULOCYTES
Immature Retic Fract: 15.4 % (ref 2.3–15.9)
RBC.: 2.52 MIL/uL — ABNORMAL LOW (ref 3.87–5.11)
Retic Count, Absolute: 24.4 10*3/uL (ref 19.0–186.0)
Retic Ct Pct: 1 % (ref 0.4–3.1)

## 2019-10-12 LAB — TYPE AND SCREEN
ABO/RH(D): O POS
Antibody Screen: NEGATIVE

## 2019-10-12 LAB — IRON AND TIBC
Iron: 58 ug/dL (ref 28–170)
Saturation Ratios: 25 % (ref 10.4–31.8)
TIBC: 231 ug/dL — ABNORMAL LOW (ref 250–450)
UIBC: 173 ug/dL

## 2019-10-12 LAB — ABO/RH: ABO/RH(D): O POS

## 2019-10-12 LAB — FERRITIN: Ferritin: 62 ng/mL (ref 11–307)

## 2019-10-12 MED ORDER — ACETAMINOPHEN 325 MG PO TABS
650.0000 mg | ORAL_TABLET | Freq: Three times a day (TID) | ORAL | Status: DC
  Administered 2019-10-12 – 2019-10-21 (×26): 650 mg via ORAL
  Filled 2019-10-12 (×26): qty 2

## 2019-10-12 NOTE — Plan of Care (Signed)
  Problem: Clinical Measurements: Goal: Diagnostic test results will improve Outcome: Progressing   Problem: Nutrition: Goal: Adequate nutrition will be maintained Outcome: Progressing   Problem: Pain Managment: Goal: General experience of comfort will improve Outcome: Progressing   

## 2019-10-12 NOTE — Progress Notes (Signed)
[facility] Progress Note  Patient: [name]    LGX:[id number]  DOA: 10/07/2019     Date of Service: the patient was seen and examined on 10/12/2019  Chief Complaint  Patient presents with  . Fall  . Shoulder Injury   Brief hospital course: Past medical history of type II DM, dementia, CKD 3b, HTN, deafness.  Presents with complaints of left shoulder pain and AKI Currently plan is monitor AKI without IV fluids.  Assessment and Plan: 1. Acute kidney injury on chronic renal disease stage IIIb. Hyperkalemia Hyponatremia Treated with IV fluids. Serum creatinine baseline 1.2-3 Currently significant elevated  Continue to monitor with IV fluids. Avoid nephrotoxic medications. Sodium and potassium improving.  2.  Left shoulder complex fluid Osteoarthritis Patient afebrile.  Significant pain on examination. Orthopedic was consulted also recommended outpatient follow-up. Due to patient's persistent symptoms IR was consulted and 30 cc of fluid drained. Currently examination is not consistent with septic arthritis still reduced arthritis. Pain control. Add scheduled Tylenol.  3.  Hypertensive urgency Due to pain. Continue home medication. Add scheduled Tylenol for pain control.  4.  Type 2 diabetes mellitus, controlled On Metformin.  Currently on hold.  Continue sliding scale insulin.  5.  Dementia. Continue Aricept and Lexapro.  6.  HLD Continue statin  7.  Hospice patient. Patient with hospice.  Currently family planning to revoke hospice and transition to treat what is treatable with full code.  They would like patient to go to rehab and get therapy.  Monitor.  8.  Left elbow with edema. Likely from not moving her extremity. Possibility of SVT cannot be ruled out due to limited motion. Ultrasound Doppler will be performed. Elevate the extremity.  Pain control with Tylenol.   Pressure Injury 10/08/19 Buttocks Right;Left Stage 1 -  Intact skin with  non-blanchable redness of a localized area usually over a bony prominence. (Active)  10/08/19 1300  Location: Buttocks  Location Orientation: Right;Left  Staging: Stage 1 -  Intact skin with non-blanchable redness of a localized area usually over a bony prominence.  Wound Description (Comments):   Present on Admission: Yes     Diet: Cardiac diet DVT Prophylaxis: Subcutaneous Heparin    Advance goals of care discussion: Full code  Family Communication: family was present at bedside, at the time of interview.   Disposition:  Status is: Inpatient  Remains inpatient appropriate because:Ongoing active pain requiring inpatient pain management, Altered mental status and Unsafe d/c plan   Dispo: The patient is from: ALF              Anticipated d/c is to: SNF              Anticipated d/c date is: 3 days              Patient currently is not medically stable to d/c.  Due to AKI  Subjective: Oral intake improving per family. No nausea no vomiting. Actually had bowel movement.  No fever no chills.  Physical Exam: General: Appear in mild distress, no Rash; Oral Mucosa Clear, moist. no Abnormal Neck Mass Or lumps, Conjunctiva normal  Cardiovascular: S1 and S2 Present, no Murmur, Respiratory: good respiratory effort, Bilateral Air entry present and Clear to Auscultation, no Crackles, no wheezes Abdomen: Bowel Sound present, Soft and no tenderness Extremities: Left upper extremity edema, no pedal edema, no calf tenderness Neurology: alert and oriented to place and person affect appropriate. no new focal deficit Gait not checked due to patient  safety concerns    Vitals:   10/11/19 1143 10/11/19 2156 10/12/19 0530 10/12/19 1234  BP: 115/72 (!) 180/105 (!) 168/101 (!) 171/108  Pulse: 68 75 70 61  Resp: 17 20 18 16   Temp: 97.8 F (36.6 C) 98.4 F (36.9 C) 98.2 F (36.8 C) 97.7 F (36.5 C)  TempSrc: Oral Oral Oral Oral  SpO2: 100% 100% 100% 98%  Weight:   60.4 kg    No intake or  output data in the 24 hours ending 10/12/19 1919 Filed Weights   10/10/19 0600 10/11/19 0500 10/12/19 0530  Weight: 55.4 kg 57.5 kg 60.4 kg    Data Reviewed: I have personally reviewed and interpreted daily labs, tele strips, imagings as discussed above. I reviewed all nursing notes, pharmacy notes, vitals, pertinent old records I have discussed plan of care as described above with RN and patient/family.  CBC: Recent Labs  Lab 10/07/19 1802 10/07/19 1802 10/08/19 0649 10/09/19 0441 10/11/19 0435 10/12/19 0447 10/12/19 0814  WBC 3.9*   < > 3.4* 4.0 3.5* 2.9* 3.1*  NEUTROABS 2.5  --  1.9  --   --   --  1.6*  HGB 10.1*   < > 9.0* 9.6* 8.8* 7.8* 8.5*  HCT 31.3*   < > 26.1* 29.0* 25.8* 23.5* 25.7*  MCV 102.3*   < > 100.4* 97.6 98.1 99.2 98.8  PLT 226   < > 212 231 208 211 229   < > = values in this interval not displayed.   Basic Metabolic Panel: Recent Labs  Lab 10/07/19 2004 10/08/19 9169 10/10/19 0810 10/10/19 0810 10/11/19 0435 10/11/19 0435 10/11/19 0809 10/11/19 1128 10/11/19 1508 10/11/19 1917 10/12/19 0447  NA  --    < > 129*   < > 128*   < > 129* 128* 128* 128* 132*  K 6.1*   < > 4.3   < > 4.4   < > 4.3 5.4* 4.7 4.9 4.3  CL  --    < > 99   < > 99   < > 99 101 99 98 104  CO2  --    < > 23   < > 23   < > 20* 21* 22 21* 22  GLUCOSE  --    < > 100*   < > 97   < > 100* 102* 158* 171* 88  BUN  --    < > 39*   < > 44*   < > 43* 44* 42* 45* 44*  CREATININE  --    < > 1.56*   < > 1.82*   < > 1.76* 1.65* 1.73* 1.84* 1.60*  CALCIUM  --    < > 8.9   < > 8.6*   < > 8.7* 8.6* 8.5* 8.3* 8.0*  MG 1.7  --   --   --  1.8  --   --   --   --   --   --   PHOS  --   --  3.0  --  3.0  --   --   --   --   --  3.0   < > = values in this interval not displayed.    Studies: No results found.  Scheduled Meds: . acetaminophen  650 mg Oral TID  . aspirin  81 mg Oral Daily  . brimonidine  1 drop Both Eyes BID   And  . timolol  1 drop Both Eyes BID  . carvedilol  25 mg Oral BID    . cloNIDine  0.3 mg Oral Daily  . donepezil  10 mg Oral QHS  . doxazosin  16 mg Oral QHS  . escitalopram  10 mg Oral Daily  . ferrous sulfate  325 mg Oral Daily  . insulin aspart  0-9 Units Subcutaneous TID WC  . mirtazapine  15 mg Oral QHS  . polyvinyl alcohol  1 drop Both Eyes BID  . pravastatin  10 mg Oral q1800  . senna-docusate  1 tablet Oral QHS  . traZODone  50 mg Oral QHS   Continuous Infusions:  PRN Meds: hydrALAZINE, HYDROcodone-acetaminophen, ondansetron **OR** ondansetron (ZOFRAN) IV  Time spent: 35 minutes  Author: Berle Mull, MD [facility] 10/12/2019 7:19 PM  To reach On-call, see care teams to locate the attending and reach out via [email or web address]. Between 7PM-7AM, please contact night-coverage If you still have difficulty reaching the attending provider, please page the [facility] (Director on Call) for [facility] on amion for assistance.

## 2019-10-13 ENCOUNTER — Inpatient Hospital Stay (HOSPITAL_COMMUNITY): Payer: Medicare Other

## 2019-10-13 DIAGNOSIS — R609 Edema, unspecified: Secondary | ICD-10-CM

## 2019-10-13 LAB — RENAL FUNCTION PANEL
Albumin: 3 g/dL — ABNORMAL LOW (ref 3.5–5.0)
Anion gap: 10 (ref 5–15)
BUN: 44 mg/dL — ABNORMAL HIGH (ref 8–23)
CO2: 21 mmol/L — ABNORMAL LOW (ref 22–32)
Calcium: 8.8 mg/dL — ABNORMAL LOW (ref 8.9–10.3)
Chloride: 99 mmol/L (ref 98–111)
Creatinine, Ser: 1.73 mg/dL — ABNORMAL HIGH (ref 0.44–1.00)
GFR calc Af Amer: 31 mL/min — ABNORMAL LOW (ref >60)
GFR calc non Af Amer: 26 mL/min — ABNORMAL LOW (ref >60)
Glucose, Bld: 94 mg/dL (ref 70–99)
Phosphorus: 3.2 mg/dL (ref 2.5–4.6)
Potassium: 5.2 mmol/L — ABNORMAL HIGH (ref 3.5–5.1)
Sodium: 130 mmol/L — ABNORMAL LOW (ref 135–145)

## 2019-10-13 LAB — BODY FLUID CULTURE: Culture: NO GROWTH

## 2019-10-13 LAB — CBC
HCT: 24.1 % — ABNORMAL LOW (ref 36.0–46.0)
Hemoglobin: 8.1 g/dL — ABNORMAL LOW (ref 12.0–15.0)
MCH: 33.3 pg (ref 26.0–34.0)
MCHC: 33.6 g/dL (ref 30.0–36.0)
MCV: 99.2 fL (ref 80.0–100.0)
Platelets: 224 10*3/uL (ref 150–400)
RBC: 2.43 MIL/uL — ABNORMAL LOW (ref 3.87–5.11)
RDW: 13.4 % (ref 11.5–15.5)
WBC: 3.5 10*3/uL — ABNORMAL LOW (ref 4.0–10.5)
nRBC: 0 % (ref 0.0–0.2)

## 2019-10-13 LAB — PROCALCITONIN: Procalcitonin: <0.1 ng/mL

## 2019-10-13 LAB — COMPREHENSIVE METABOLIC PANEL
ALT: 17 U/L (ref 0–44)
AST: 18 U/L (ref 15–41)
Albumin: 3 g/dL — ABNORMAL LOW (ref 3.5–5.0)
Alkaline Phosphatase: 52 U/L (ref 38–126)
Anion gap: 6 (ref 5–15)
BUN: 39 mg/dL — ABNORMAL HIGH (ref 8–23)
CO2: 24 mmol/L (ref 22–32)
Calcium: 8.6 mg/dL — ABNORMAL LOW (ref 8.9–10.3)
Chloride: 97 mmol/L — ABNORMAL LOW (ref 98–111)
Creatinine, Ser: 1.7 mg/dL — ABNORMAL HIGH (ref 0.44–1.00)
GFR calc Af Amer: 31 mL/min — ABNORMAL LOW (ref >60)
GFR calc non Af Amer: 27 mL/min — ABNORMAL LOW (ref >60)
Glucose, Bld: 153 mg/dL — ABNORMAL HIGH (ref 70–99)
Potassium: 4.7 mmol/L (ref 3.5–5.1)
Sodium: 127 mmol/L — ABNORMAL LOW (ref 135–145)
Total Bilirubin: 0.2 mg/dL — ABNORMAL LOW (ref 0.3–1.2)
Total Protein: 5.8 g/dL — ABNORMAL LOW (ref 6.5–8.1)

## 2019-10-13 LAB — SARS CORONAVIRUS 2 BY RT PCR (HOSPITAL ORDER, PERFORMED IN ~~LOC~~ HOSPITAL LAB): SARS Coronavirus 2: POSITIVE — AB

## 2019-10-13 LAB — SARS CORONAVIRUS 2 (TAT 6-24 HRS): SARS Coronavirus 2: POSITIVE — AB

## 2019-10-13 LAB — GLUCOSE, CAPILLARY
Glucose-Capillary: 86 mg/dL (ref 70–99)
Glucose-Capillary: 102 mg/dL — ABNORMAL HIGH (ref 70–99)
Glucose-Capillary: 119 mg/dL — ABNORMAL HIGH (ref 70–99)
Glucose-Capillary: 98 mg/dL (ref 70–99)

## 2019-10-13 LAB — FERRITIN: Ferritin: 63 ng/mL (ref 11–307)

## 2019-10-13 LAB — C-REACTIVE PROTEIN: CRP: 1.3 mg/dL — ABNORMAL HIGH (ref <1.0)

## 2019-10-13 LAB — D-DIMER, QUANTITATIVE: D-Dimer, Quant: 2.63 ug/mL-FEU — ABNORMAL HIGH (ref 0.00–0.50)

## 2019-10-13 MED ORDER — ENSURE ENLIVE PO LIQD
237.0000 mL | Freq: Three times a day (TID) | ORAL | Status: DC
  Administered 2019-10-13 – 2019-10-21 (×24): 237 mL via ORAL

## 2019-10-13 MED ORDER — BOOST PLUS PO LIQD
237.0000 mL | Freq: Three times a day (TID) | ORAL | Status: DC
  Administered 2019-10-13 – 2019-10-21 (×22): 237 mL via ORAL
  Filled 2019-10-13 (×25): qty 237

## 2019-10-13 MED ORDER — GUAIFENESIN-DM 100-10 MG/5ML PO SYRP: 10.0000 mL | ORAL_SOLUTION | ORAL | Status: DC | PRN

## 2019-10-13 MED ORDER — HYDROCOD POLST-CPM POLST ER 10-8 MG/5ML PO SUER: 5.0000 mL | Freq: Two times a day (BID) | ORAL | Status: DC | PRN

## 2019-10-13 MED ORDER — ASCORBIC ACID 500 MG PO TABS
500.0000 mg | ORAL_TABLET | Freq: Every day | ORAL | Status: DC
  Administered 2019-10-13 – 2019-10-21 (×9): 500 mg via ORAL
  Filled 2019-10-13 (×9): qty 1

## 2019-10-13 MED ORDER — PRO-STAT SUGAR FREE PO LIQD
30.0000 mL | Freq: Two times a day (BID) | ORAL | Status: DC
  Administered 2019-10-13 – 2019-10-21 (×16): 30 mL via ORAL
  Filled 2019-10-13 (×16): qty 30

## 2019-10-13 MED ORDER — ZINC SULFATE 220 (50 ZN) MG PO CAPS
220.0000 mg | ORAL_CAPSULE | Freq: Every day | ORAL | Status: DC
  Administered 2019-10-13 – 2019-10-21 (×9): 220 mg via ORAL
  Filled 2019-10-13 (×9): qty 1

## 2019-10-13 NOTE — Progress Notes (Signed)
Notification made to son [name] about mothers positive COVID test results.  Son has questions regarding how she could have been negative 5 days ago upon admission and positive now, is the test flawed could it be a false positive ect.  Son would like an update as to what the treatment plan will be going forward.  Son is mostly concerned about not being able to visit given his mothers complex communication needs.  Day RN will need to call later today 6/20 to update the son about treatment plan

## 2019-10-13 NOTE — Progress Notes (Addendum)
[facility] Progress Note  Patient: [name]    KYH:[id number]  DOA: 10/07/2019     Date of Service: the patient was seen and examined on 10/13/2019  Chief Complaint  Patient presents with  . Fall  . Shoulder Injury   Brief hospital course: Past medical history of type II DM, dementia, CKD 3b, HTN, deafness.  Presents with complaints of left shoulder pain and AKI Currently plan is monitor AKI.  Assessment and Plan: 1. Acute kidney injury on chronic renal disease stage IIIb. Hyperkalemia Hyponatremia Treated with IV fluids. Serum creatinine baseline 1.2-1.3 Currently elevated  Avoid nephrotoxic medications. Sodium also low. Encourage p.o. fluids. Adding protein supplements with expectation of improvement in sodium.  2.  Left shoulder complex fluid Osteoarthritis Patient afebrile.  Significant pain on examination. Orthopedic was consulted also recommended outpatient follow-up. Due to patient's persistent symptoms IR was consulted and 30 cc of fluid drained. Currently examination is not consistent with septic arthritis still reduced arthritis. Pain control. Add scheduled Tylenol. CRP normal  3.  Hypertensive urgency Due to pain. Continue home medication. Add scheduled Tylenol for pain control.  4.  Type 2 diabetes mellitus, controlled On Metformin.  Currently on hold.  Continue sliding scale insulin.  5.  Dementia. Continue Aricept and Lexapro.  6.  HLD Continue statin  7.  Hospice patient. Patient with hospice.  Currently family planning to revoke hospice and transition to treat what is treatable with full code.  They would like patient to go to rehab and get therapy.  Monitor.  8.  Left elbow with edema. Likely from not moving her extremity. Possibility of SVT cannot be ruled out due to limited motion. Ultrasound Doppler negative for any DVT or SVT. Elevate the extremity.  Pain control with Tylenol.  9.  SARS-CoV-2 positive. No symptoms of  shortness of breath, cough, fever.  No chest pain.  No diarrhea.  CRP normal. Chest x-ray unremarkable for any infiltrate. Likely false positive although repeat chest with a different platform was also positive therefore possibility of an early infection cannot ruled out. Continue airborne isolation. CT value on cephaid platform was 39. Vitamin C and zinc.  Remdesivir and steroids not indicated. Discussed with Dr. Johnnye Sima.    10.  Left buttock pressure ulcer stage I POA. Continue foam dressing. Pressure Injury 10/08/19 Buttocks Right;Left Stage 1 -  Intact skin with non-blanchable redness of a localized area usually over a bony prominence. (Active)  10/08/19 1300  Location: Buttocks  Location Orientation: Right;Left  Staging: Stage 1 -  Intact skin with non-blanchable redness of a localized area usually over a bony prominence.  Wound Description (Comments):   Present on Admission: Yes    11.  Visitation   The sole way of communicating with this patient is in the presence of her son who helps with sign language interpretation.  Patient due to her dementia does not do well with video interpreter for ASL.  Discussed with son regarding his risk for contracting Covid by being in the same room with her despite isolation and [place].  Son understands the risk and would still like to be present if it is possible. Currently due to lack of surety of the diagnosis and limited allowing family members to visit the patient. D/w dr hatcher.  Diet: Cardiac diet DVT Prophylaxis: Subcutaneous Heparin    Advance goals of care discussion: Full code  Family Communication: family was present at bedside, at the time of interview.   Disposition:  Status is:  Inpatient  Remains inpatient appropriate because:Ongoing active pain requiring inpatient pain management, Altered mental status and Unsafe d/c plan   Dispo: The patient is from: ALF              Anticipated d/c is to: SNF              Anticipated  d/c date is: 3 days              Patient currently is not medically stable to d/c.  Due to AKI  Subjective: Oral intake adequate.  No nausea no vomiting.  2 BM last night.  Physical Exam: General: Appear in mild distress, no Rash; Oral Mucosa Clear, moist. no Abnormal Neck Mass Or lumps, Conjunctiva normal  Cardiovascular: S1 and S2 Present, no Murmur, Respiratory: good respiratory effort, Bilateral Air entry present and Clear to Auscultation, no Crackles, no wheezes Abdomen: Bowel Sound present, Soft and no tenderness Extremities: no Pedal edema, no calf tenderness,  Left upper extremity edema present. Neurology: alert and oriented to place and person affect appropriate. no new focal deficit Gait not checked due to patient safety concerns    Vitals:   10/12/19 2229 10/12/19 2240 10/13/19 0351 10/13/19 1239  BP: (!) 172/95 (!) 151/81 (!) 150/72 139/83  Pulse: 68 75 79 61  Resp:   18 17  Temp:   98.6 F (37 C) 97.6 F (36.4 C)  TempSrc:   Oral Oral  SpO2:   98% 100%  Weight:        Intake/Output Summary (Last 24 hours) at 10/13/2019 1758 Last data filed at 10/13/2019 1722 Gross per 24 hour  Intake 3615.09 ml  Output 1900 ml  Net 1715.09 ml   Filed Weights   10/10/19 0600 10/11/19 0500 10/12/19 0530  Weight: 55.4 kg 57.5 kg 60.4 kg    Data Reviewed: I have personally reviewed and interpreted daily labs, tele strips, imagings as discussed above. I reviewed all nursing notes, pharmacy notes, vitals, pertinent old records I have discussed plan of care as described above with RN and patient/family.  CBC: Recent Labs  Lab 10/07/19 1802 10/07/19 1802 10/08/19 0649 10/08/19 0649 10/09/19 0441 10/11/19 0435 10/12/19 0447 10/12/19 0814 10/13/19 0351  WBC 3.9*   < > 3.4*   < > 4.0 3.5* 2.9* 3.1* 3.5*  NEUTROABS 2.5  --  1.9  --   --   --   --  1.6*  --   HGB 10.1*   < > 9.0*   < > 9.6* 8.8* 7.8* 8.5* 8.1*  HCT 31.3*   < > 26.1*   < > 29.0* 25.8* 23.5* 25.7* 24.1*  MCV  102.3*   < > 100.4*   < > 97.6 98.1 99.2 98.8 99.2  PLT 226   < > 212   < > 231 208 211 229 224   < > = values in this interval not displayed.   Basic Metabolic Panel: Recent Labs  Lab 10/07/19 2004 10/08/19 4098 10/10/19 0810 10/10/19 0810 10/11/19 0435 10/11/19 0809 10/11/19 1508 10/11/19 1917 10/12/19 0447 10/13/19 0351 10/13/19 1434  NA  --    < > 129*   < > 128*   < > 128* 128* 132* 130* 127*  K 6.1*   < > 4.3   < > 4.4   < > 4.7 4.9 4.3 5.2* 4.7  CL  --    < > 99   < > 99   < > 99 98 104 99 97*  CO2  --    < > 23   < > 23   < > 22 21* 22 21* 24  GLUCOSE  --    < > 100*   < > 97   < > 158* 171* 88 94 153*  BUN  --    < > 39*   < > 44*   < > 42* 45* 44* 44* 39*  CREATININE  --    < > 1.56*   < > 1.82*   < > 1.73* 1.84* 1.60* 1.73* 1.70*  CALCIUM  --    < > 8.9   < > 8.6*   < > 8.5* 8.3* 8.0* 8.8* 8.6*  MG 1.7  --   --   --  1.8  --   --   --   --   --   --   PHOS  --   --  3.0  --  3.0  --   --   --  3.0 3.2  --    < > = values in this interval not displayed.    Studies: DG CHEST PORT 1 VIEW  Result Date: 10/13/2019 CLINICAL DATA:  Shortness of breath, COVID-19 positive EXAM: PORTABLE CHEST 1 VIEW COMPARISON:  10/11/2019 FINDINGS: Stable cardiomegaly. Atherosclerotic calcification of the aortic knob. No focal airspace consolidation, pleural effusion, or pneumothorax. Degenerative changes of the shoulders. IMPRESSION: No active disease. Electronically Signed   By: Davina Poke D.O.   On: 10/13/2019 12:37   VAS [place] UPPER EXTREMITY VENOUS DUPLEX  Result Date: 10/13/2019 UPPER VENOUS STUDY  Indications: Pain, and Swelling Limitations: Patient position. Comparison Study: No prior study Performing Technologist: Maudry Mayhew MHA, RDMS, RVT, RDCS  Examination Guidelines: A complete evaluation includes B-mode imaging, spectral Doppler, color Doppler, and power Doppler as needed of all accessible portions of each vessel. Bilateral testing is considered an integral part of a  complete examination. Limited examinations for reoccurring indications may be performed as noted.  Right Findings: +----------+------------+---------+-----------+----------+--------------+ RIGHT     CompressiblePhasicitySpontaneousProperties   Summary     +----------+------------+---------+-----------+----------+--------------+ Subclavian                                          Not visualized +----------+------------+---------+-----------+----------+--------------+  Left Findings: +----------+------------+---------+-----------+----------+--------------+ LEFT      CompressiblePhasicitySpontaneousProperties   Summary     +----------+------------+---------+-----------+----------+--------------+ IJV           Full       Yes       Yes                             +----------+------------+---------+-----------+----------+--------------+ Subclavian    Full       Yes       Yes                             +----------+------------+---------+-----------+----------+--------------+ Axillary      Full       Yes       Yes                             +----------+------------+---------+-----------+----------+--------------+ Brachial      Full       Yes       Yes  Rouleax flow  +----------+------------+---------+-----------+----------+--------------+ Radial        Full                                                 +----------+------------+---------+-----------+----------+--------------+ Ulnar         Full                                                 +----------+------------+---------+-----------+----------+--------------+ Cephalic      Full                                                 +----------+------------+---------+-----------+----------+--------------+ Basilic                                             Not visualized +----------+------------+---------+-----------+----------+--------------+  Summary:  Left: No evidence of deep vein  thrombosis involvign the visualized veins of the upper extremity. No evidence of superficial vein thrombosis involvign the visualized veins of the upper extremity.  *See table(s) above for measurements and observations.  Diagnosing physician: Servando Snare MD Electronically signed by Servando Snare MD on 10/13/2019 at 3:47:00 PM.    Final     Scheduled Meds: . acetaminophen  650 mg Oral TID  . vitamin C  500 mg Oral Daily  . aspirin  81 mg Oral Daily  . brimonidine  1 drop Both Eyes BID   And  . timolol  1 drop Both Eyes BID  . carvedilol  25 mg Oral BID  . cloNIDine  0.3 mg Oral Daily  . donepezil  10 mg Oral QHS  . doxazosin  16 mg Oral QHS  . escitalopram  10 mg Oral Daily  . feeding supplement (ENSURE ENLIVE)  237 mL Oral TID BM  . feeding supplement (PRO-STAT SUGAR FREE 64)  30 mL Oral BID  . ferrous sulfate  325 mg Oral Daily  . insulin aspart  0-9 Units Subcutaneous TID WC  . lactose free nutrition  237 mL Oral TID WC  . mirtazapine  15 mg Oral QHS  . polyvinyl alcohol  1 drop Both Eyes BID  . pravastatin  10 mg Oral q1800  . senna-docusate  1 tablet Oral QHS  . traZODone  50 mg Oral QHS  . zinc sulfate  220 mg Oral Daily   Continuous Infusions:  PRN Meds: guaiFENesin-dextromethorphan, hydrALAZINE, HYDROcodone-acetaminophen, ondansetron **OR** ondansetron (ZOFRAN) IV  Time spent: 35 minutes  Author: Berle Mull, MD [facility] 10/13/2019 5:58 PM  To reach On-call, see care teams to locate the attending and reach out via [email or web address]. Between 7PM-7AM, please contact night-coverage If you still have difficulty reaching the attending provider, please page the [facility] (Director on Call) for [facility] on amion for assistance.

## 2019-10-13 NOTE — Progress Notes (Signed)
Left upper extremity venous duplex completed. Refer to "CV Proc" under chart review to view preliminary results.  10/13/2019 11:01 AM Kelby Aline., MHA, RVT, RDCS, RDMS

## 2019-10-14 LAB — C-REACTIVE PROTEIN: CRP: <0.5 mg/dL (ref <1.0)

## 2019-10-14 LAB — CBC WITH DIFFERENTIAL/PLATELET
Abs Immature Granulocytes: 0 10*3/uL (ref 0.00–0.07)
Basophils Absolute: 0 10*3/uL (ref 0.0–0.1)
Basophils Relative: 1 %
Eosinophils Absolute: 0.1 10*3/uL (ref 0.0–0.5)
Eosinophils Relative: 3 %
HCT: 24.2 % — ABNORMAL LOW (ref 36.0–46.0)
Hemoglobin: 8 g/dL — ABNORMAL LOW (ref 12.0–15.0)
Immature Granulocytes: 0 %
Lymphocytes Relative: 38 %
Lymphs Abs: 1.2 10*3/uL (ref 0.7–4.0)
MCH: 32.8 pg (ref 26.0–34.0)
MCHC: 33.1 g/dL (ref 30.0–36.0)
MCV: 99.2 fL (ref 80.0–100.0)
Monocytes Absolute: 0.4 10*3/uL (ref 0.1–1.0)
Monocytes Relative: 11 %
Neutro Abs: 1.5 10*3/uL — ABNORMAL LOW (ref 1.7–7.7)
Neutrophils Relative %: 47 %
Platelets: 231 10*3/uL (ref 150–400)
RBC: 2.44 MIL/uL — ABNORMAL LOW (ref 3.87–5.11)
RDW: 13.4 % (ref 11.5–15.5)
WBC: 3.1 10*3/uL — ABNORMAL LOW (ref 4.0–10.5)
nRBC: 0 % (ref 0.0–0.2)

## 2019-10-14 LAB — COMPREHENSIVE METABOLIC PANEL
ALT: 17 U/L (ref 0–44)
AST: 18 U/L (ref 15–41)
Albumin: 2.9 g/dL — ABNORMAL LOW (ref 3.5–5.0)
Alkaline Phosphatase: 48 U/L (ref 38–126)
Anion gap: 7 (ref 5–15)
BUN: 43 mg/dL — ABNORMAL HIGH (ref 8–23)
CO2: 24 mmol/L (ref 22–32)
Calcium: 8.7 mg/dL — ABNORMAL LOW (ref 8.9–10.3)
Chloride: 95 mmol/L — ABNORMAL LOW (ref 98–111)
Creatinine, Ser: 1.62 mg/dL — ABNORMAL HIGH (ref 0.44–1.00)
GFR calc Af Amer: 33 mL/min — ABNORMAL LOW (ref >60)
GFR calc non Af Amer: 29 mL/min — ABNORMAL LOW (ref >60)
Glucose, Bld: 95 mg/dL (ref 70–99)
Potassium: 4.9 mmol/L (ref 3.5–5.1)
Sodium: 126 mmol/L — ABNORMAL LOW (ref 135–145)
Total Bilirubin: 0.4 mg/dL (ref 0.3–1.2)
Total Protein: 5.5 g/dL — ABNORMAL LOW (ref 6.5–8.1)

## 2019-10-14 LAB — BASIC METABOLIC PANEL
Anion gap: 8 (ref 5–15)
Anion gap: 7 (ref 5–15)
Anion gap: 9 (ref 5–15)
Anion gap: 10 (ref 5–15)
BUN: 42 mg/dL — ABNORMAL HIGH (ref 8–23)
BUN: 49 mg/dL — ABNORMAL HIGH (ref 8–23)
BUN: 56 mg/dL — ABNORMAL HIGH (ref 8–23)
BUN: 59 mg/dL — ABNORMAL HIGH (ref 8–23)
CO2: 24 mmol/L (ref 22–32)
CO2: 26 mmol/L (ref 22–32)
CO2: 23 mmol/L (ref 22–32)
CO2: 24 mmol/L (ref 22–32)
Calcium: 8.7 mg/dL — ABNORMAL LOW (ref 8.9–10.3)
Calcium: 8.6 mg/dL — ABNORMAL LOW (ref 8.9–10.3)
Calcium: 8.7 mg/dL — ABNORMAL LOW (ref 8.9–10.3)
Calcium: 8.9 mg/dL (ref 8.9–10.3)
Chloride: 97 mmol/L — ABNORMAL LOW (ref 98–111)
Chloride: 97 mmol/L — ABNORMAL LOW (ref 98–111)
Chloride: 98 mmol/L (ref 98–111)
Chloride: 97 mmol/L — ABNORMAL LOW (ref 98–111)
Creatinine, Ser: 1.64 mg/dL — ABNORMAL HIGH (ref 0.44–1.00)
Creatinine, Ser: 1.75 mg/dL — ABNORMAL HIGH (ref 0.44–1.00)
Creatinine, Ser: 1.89 mg/dL — ABNORMAL HIGH (ref 0.44–1.00)
Creatinine, Ser: 2.13 mg/dL — ABNORMAL HIGH (ref 0.44–1.00)
GFR calc Af Amer: 33 mL/min — ABNORMAL LOW (ref >60)
GFR calc Af Amer: 30 mL/min — ABNORMAL LOW (ref >60)
GFR calc Af Amer: 28 mL/min — ABNORMAL LOW (ref >60)
GFR calc Af Amer: 24 mL/min — ABNORMAL LOW (ref >60)
GFR calc non Af Amer: 28 mL/min — ABNORMAL LOW (ref >60)
GFR calc non Af Amer: 26 mL/min — ABNORMAL LOW (ref >60)
GFR calc non Af Amer: 24 mL/min — ABNORMAL LOW (ref >60)
GFR calc non Af Amer: 21 mL/min — ABNORMAL LOW (ref >60)
Glucose, Bld: 192 mg/dL — ABNORMAL HIGH (ref 70–99)
Glucose, Bld: 170 mg/dL — ABNORMAL HIGH (ref 70–99)
Glucose, Bld: 138 mg/dL — ABNORMAL HIGH (ref 70–99)
Glucose, Bld: 164 mg/dL — ABNORMAL HIGH (ref 70–99)
Potassium: 4.5 mmol/L (ref 3.5–5.1)
Potassium: 4.6 mmol/L (ref 3.5–5.1)
Potassium: 4.9 mmol/L (ref 3.5–5.1)
Potassium: 5.1 mmol/L (ref 3.5–5.1)
Sodium: 129 mmol/L — ABNORMAL LOW (ref 135–145)
Sodium: 130 mmol/L — ABNORMAL LOW (ref 135–145)
Sodium: 130 mmol/L — ABNORMAL LOW (ref 135–145)
Sodium: 131 mmol/L — ABNORMAL LOW (ref 135–145)

## 2019-10-14 LAB — GLUCOSE, CAPILLARY
Glucose-Capillary: 88 mg/dL (ref 70–99)
Glucose-Capillary: 170 mg/dL — ABNORMAL HIGH (ref 70–99)
Glucose-Capillary: 135 mg/dL — ABNORMAL HIGH (ref 70–99)
Glucose-Capillary: 166 mg/dL — ABNORMAL HIGH (ref 70–99)

## 2019-10-14 LAB — SYNOVIAL CELL COUNT + DIFF, W/ CRYSTALS
Crystals, Fluid: NONE SEEN
Lymphocytes-Synovial Fld: 17 % (ref 0–20)
Monocyte-Macrophage-Synovial Fluid: 56 % (ref 50–90)
Neutrophil, Synovial: 27 % — ABNORMAL HIGH (ref 0–25)
WBC, Synovial: 207 /mm3 — ABNORMAL HIGH (ref 0–200)

## 2019-10-14 LAB — D-DIMER, QUANTITATIVE: D-Dimer, Quant: 2.68 ug/mL-FEU — ABNORMAL HIGH (ref 0.00–0.50)

## 2019-10-14 LAB — FERRITIN: Ferritin: 66 ng/mL (ref 11–307)

## 2019-10-14 MED ORDER — SODIUM CHLORIDE 1 G PO TABS: 1.0000 g | ORAL_TABLET | Freq: Two times a day (BID) | ORAL | Status: DC

## 2019-10-14 MED ORDER — SODIUM CHLORIDE 1 G PO TABS
2.0000 g | ORAL_TABLET | Freq: Three times a day (TID) | ORAL | Status: DC
  Administered 2019-10-14: 2 g via ORAL
  Filled 2019-10-14: qty 2

## 2019-10-14 MED ORDER — SODIUM CHLORIDE 0.9 % IV SOLN: INTRAVENOUS | Status: DC

## 2019-10-14 NOTE — Progress Notes (Signed)
[facility] PROGRESS NOTE  Patient: [name] KDT:[id number]   PCP: Lynnell Catalan, FNP DOB: [birth year]   DOA: 10/07/2019   DOS: 10/14/2019    Subjective: No acute events reported.  No nausea no vomiting.  Worked with physical therapy today.  Objective:  Vitals:   10/14/19 1000 10/14/19 1231  BP:  (!) 88/60  Pulse:  72  Resp:  20  Temp:  98.2 F (36.8 C)  SpO2: 96% 100%    Unable to perform exam due to inability to communicate.  Assessment and plan: Acute kidney injury, hyponatremia. Renal function worsening but sodium improving. We will provide a visit IV hydration and monitor renal function. Ultrasound renal shows no evidence of obstruction.  Right pleural effusion. Chest x-ray performed Small effusion on the x-ray.  No indication for further work-up.  Left shoulder pain. SP IR guided drainage. Work-up so far negative for any acute abnormality. Continue pain control.  COVID-19 diagnosis. Discussed with ID.  Patient has to stay in isolation for now. Although currently CRP negative patient asymptomatic therefore no indication for any treatment modification or initiation.  Author: Berle Mull, MD [facility] 10/14/2019 7:05 PM   If 7PM-7AM, please contact night-coverage at [email or web address]

## 2019-10-14 NOTE — Progress Notes (Signed)
Physical Therapy Treatment Patient Details Name: [name] MRN: [id number] DOB: April 17, 1933 Today's Date: 10/14/2019    History of Present Illness 86 yo female admitted with ARF, L shoulder pain 2* effusion/OA. Hx of deafness, dementia, DM, ulcers, syncopal episodes, ETOH abuse, CKD. COVID (+) 10/09/19    PT Comments    Mod assist for all mobility tasks on today. Son not present to assist with translating but pt was still able to participate. Now with new covid diagnosis. Continue to recommend SNF.   Follow Up Recommendations  SNF     Equipment Recommendations  None recommended by PT    Recommendations for Other Services       Precautions / Restrictions Precautions Precautions: Fall Precaution Comments: L shoulder pain (poor functional use) Restrictions Weight Bearing Restrictions: No    Mobility  Bed Mobility Overal bed mobility: Needs Assistance Bed Mobility: Supine to Sit;Sit to Supine     Supine to sit: Mod assist;HOB elevated Sit to supine: Mod assist;HOB elevated   General bed mobility comments: lots of cues, increased time, assist for trunk and bil LEs. LOB to L side side x 1  Transfers Overall transfer level: Needs assistance Equipment used: Rolling walker (2 wheeled) Transfers: Sit to/from Stand Sit to Stand: Mod assist;From elevated surface         General transfer comment: x2 mod A to power up, cues to push through feet, RUE assisting with pushing from bed, limited LUE assist 2* pain.  Ambulation/Gait Ambulation/Gait assistance: Min assist Gait Distance (Feet): 15 Feet Assistive device: Rolling walker (2 wheeled) Gait Pattern/deviations: Step-through pattern;Decreased stride length;Trunk flexed     General Gait Details: min A to steady and to assist in maneuvering RW, minimal LUE use on RW, very unsteady   Stairs             Wheelchair Mobility    Modified Rankin (Stroke Patients Only)       Balance Overall balance  assessment: Needs assistance;History of Falls         Standing balance support: Bilateral upper extremity supported Standing balance-Leahy Scale: Poor                              Cognition Arousal/Alertness: Awake/alert Behavior During Therapy: WFL for tasks assessed/performed Overall Cognitive Status: Difficult to assess                                        Exercises      General Comments        Pertinent Vitals/Pain Pain Assessment: Faces Faces Pain Scale: Hurts little more Pain Location: left shoulder Pain Descriptors / Indicators: Guarding;Discomfort Pain Intervention(s): Limited activity within patient's tolerance;Monitored during session;Repositioned    Home Living                      Prior Function            PT Goals (current goals can now be found in the care plan section) Progress towards PT goals: Progressing toward goals    Frequency    Min 3X/week      PT Plan Current plan remains appropriate    Co-evaluation              AM-PAC PT "6 Clicks" Mobility   Outcome Measure  Help needed turning from your back  to your side while in a flat bed without using bedrails?: A Lot Help needed moving from lying on your back to sitting on the side of a flat bed without using bedrails?: A Lot Help needed moving to and from a bed to a chair (including a wheelchair)?: A Lot Help needed standing up from a chair using your arms (e.g., wheelchair or bedside chair)?: A Lot Help needed to walk in hospital room?: Total Help needed climbing 3-5 steps with a railing? : A Lot 6 Click Score: 11    End of Session Equipment Utilized During Treatment: Gait belt Activity Tolerance: Patient tolerated treatment well Patient left: in bed;with call bell/phone within reach;with bed alarm set   PT Visit Diagnosis: Muscle weakness (generalized) (M62.81);Pain;History of falling (Z91.81) Pain - Right/Left: Left Pain - part of  body: Shoulder     Time: 0037-0488 PT Time Calculation (min) (ACUTE ONLY): 27 min  Charges:  $Gait Training: 8-22 mins                         Doreatha Massed, PT Acute Rehabilitation  Office: [phone] Pager: [phone]

## 2019-10-15 LAB — C-REACTIVE PROTEIN: CRP: 0.7 mg/dL (ref <1.0)

## 2019-10-15 LAB — COMPREHENSIVE METABOLIC PANEL
ALT: 24 U/L (ref 0–44)
AST: 25 U/L (ref 15–41)
Albumin: 3 g/dL — ABNORMAL LOW (ref 3.5–5.0)
Alkaline Phosphatase: 54 U/L (ref 38–126)
Anion gap: 8 (ref 5–15)
BUN: 58 mg/dL — ABNORMAL HIGH (ref 8–23)
CO2: 24 mmol/L (ref 22–32)
Calcium: 8.6 mg/dL — ABNORMAL LOW (ref 8.9–10.3)
Chloride: 98 mmol/L (ref 98–111)
Creatinine, Ser: 2.02 mg/dL — ABNORMAL HIGH (ref 0.44–1.00)
GFR calc Af Amer: 25 mL/min — ABNORMAL LOW (ref >60)
GFR calc non Af Amer: 22 mL/min — ABNORMAL LOW (ref >60)
Glucose, Bld: 106 mg/dL — ABNORMAL HIGH (ref 70–99)
Potassium: 5 mmol/L (ref 3.5–5.1)
Sodium: 130 mmol/L — ABNORMAL LOW (ref 135–145)
Total Bilirubin: 0.5 mg/dL (ref 0.3–1.2)
Total Protein: 5.6 g/dL — ABNORMAL LOW (ref 6.5–8.1)

## 2019-10-15 LAB — GLUCOSE, CAPILLARY
Glucose-Capillary: 100 mg/dL — ABNORMAL HIGH (ref 70–99)
Glucose-Capillary: 182 mg/dL — ABNORMAL HIGH (ref 70–99)
Glucose-Capillary: 105 mg/dL — ABNORMAL HIGH (ref 70–99)
Glucose-Capillary: 166 mg/dL — ABNORMAL HIGH (ref 70–99)

## 2019-10-15 LAB — CBC WITH DIFFERENTIAL/PLATELET
Abs Immature Granulocytes: 0.01 10*3/uL (ref 0.00–0.07)
Basophils Absolute: 0 10*3/uL (ref 0.0–0.1)
Basophils Relative: 1 %
Eosinophils Absolute: 0.1 10*3/uL (ref 0.0–0.5)
Eosinophils Relative: 1 %
HCT: 24.4 % — ABNORMAL LOW (ref 36.0–46.0)
Hemoglobin: 8 g/dL — ABNORMAL LOW (ref 12.0–15.0)
Immature Granulocytes: 0 %
Lymphocytes Relative: 31 %
Lymphs Abs: 1.4 10*3/uL (ref 0.7–4.0)
MCH: 33.1 pg (ref 26.0–34.0)
MCHC: 32.8 g/dL (ref 30.0–36.0)
MCV: 100.8 fL — ABNORMAL HIGH (ref 80.0–100.0)
Monocytes Absolute: 0.6 10*3/uL (ref 0.1–1.0)
Monocytes Relative: 12 %
Neutro Abs: 2.5 10*3/uL (ref 1.7–7.7)
Neutrophils Relative %: 55 %
Platelets: 274 10*3/uL (ref 150–400)
RBC: 2.42 MIL/uL — ABNORMAL LOW (ref 3.87–5.11)
RDW: 13.4 % (ref 11.5–15.5)
WBC: 4.5 10*3/uL (ref 4.0–10.5)
nRBC: 0 % (ref 0.0–0.2)

## 2019-10-15 LAB — FERRITIN: Ferritin: 59 ng/mL (ref 11–307)

## 2019-10-15 LAB — D-DIMER, QUANTITATIVE: D-Dimer, Quant: 3.24 ug/mL-FEU — ABNORMAL HIGH (ref 0.00–0.50)

## 2019-10-15 NOTE — Progress Notes (Signed)
[facility] Progress Note  Patient: [name]    QPR:[id number]  DOA: 10/07/2019     Date of Service: the patient was seen and examined on 10/15/2019  Chief Complaint  Patient presents with  . Fall  . Shoulder Injury   Brief hospital course: Past medical history of type II DM, dementia, CKD 3b, HTN, deafness. Presents with complaints of left shoulder pain and AKI  Currently plan is monitor for improvement in AKI with IV hydration.  Assessment and Plan: 1. Acute kidney injury on chronic renal disease stage IIIb. Hyperkalemia Hyponatremia Continue with IV fluids. Serum creatinine baseline 1.2-1.3 Currently elevated  Avoid nephrotoxic medications. Sodium also low. Encourage p.o. fluids. Adding protein supplements with expectation of improvement in sodium.  2.  Left shoulder complex fluid Osteoarthritis Patient afebrile.  Significant pain on examination. Orthopedic was consulted also recommended outpatient follow-up. Due to patient's persistent symptoms IR was consulted and 30 cc of fluid drained. Currently examination is not consistent with septic arthritis still reduced arthritis. Pain control. Add scheduled Tylenol. CRP normal  3.  Hypertensive urgency Due to pain. Continue home medication. Add scheduled Tylenol for pain control.  4.  Type 2 diabetes mellitus, controlled On Metformin.  Currently on hold.  Continue sliding scale insulin.  5.  Dementia. Continue Aricept and Lexapro.  6.  HLD Continue statin  7.  Hospice patient. Patient was active with hospice.  Currently family planning to revoke hospice and transition to treat what is treatable with full code.   They would like patient to go to rehab and get therapy.  Monitor.  8.  Left elbow with edema. Likely from not moving her extremity. Ultrasound Doppler negative for any DVT or SVT. Elevate the extremity.  Pain control with Tylenol.  9.  SARS-CoV-2 positive. No symptoms of  shortness of breath, cough, fever.  No chest pain.  No diarrhea.  CRP normal. Chest x-ray unremarkable for any infiltrate. Likely false positive although repeat chest with a different platform was also positive therefore possibility of an early infection cannot ruled out. Continue airborne isolation. No prior history of Covid diagnosis no prior positive Covid test. CT value on cephaid platform was 39. Vitamin C and zinc.  Remdesivir and steroids not indicated.  10.  Left buttock pressure ulcer stage I POA. Continue foam dressing. Pressure Injury 10/08/19 Buttocks Right;Left Stage 1 -  Intact skin with non-blanchable redness of a localized area usually over a bony prominence. (Active)  10/08/19 1300  Location: Buttocks  Location Orientation: Right;Left  Staging: Stage 1 -  Intact skin with non-blanchable redness of a localized area usually over a bony prominence.  Wound Description (Comments):   Present on Admission: Yes    11.  Visitation   The sole way of communicating with this patient is in the presence of her son who helps with sign language interpretation.  Patient due to her dementia does not do well with video interpreter for ASL.  Discussed with son regarding his risk for contracting Covid by being in the same room with her despite isolation and gowning.  Son understands the risk and would still like to be present if it is possible. Currently due to lack of surety of the diagnosis and limited means of communication allowing family members to visit the patient.  Diet: Regular diet DVT Prophylaxis: Subcutaneous Heparin    Advance goals of care discussion: Full code  Family Communication: no family was present at bedside, at the time of interview.  Disposition:  Status is: Inpatient  Remains inpatient appropriate because:Ongoing diagnostic testing needed not appropriate for outpatient work up   Dispo: The patient is from: Home              Anticipated d/c is to: SNF               Anticipated d/c date is: 2 days              Patient currently is not medically stable to d/c.        Subjective: No nausea no vomiting. No acute events overnight. No LOS available as the patient does not have family member to interpret ASL  Physical Exam:  General: Appear in no distress,   Cardiovascular: S1 and S2 Present, no Murmur, Respiratory: good respiratory effort, Bilateral Air entry present and Clear to Auscultation, no Crackles, no wheezes Abdomen: Bowel Sound present, Extremities: no Pedal edema,  Neurology: alert and affect appropriate. no new focal deficit Gait not checked due to patient safety concerns  Vitals:   10/15/19 0514 10/15/19 0531 10/15/19 0613 10/15/19 1158  BP: (!) 174/102 (!) 149/86 (!) 141/86 (!) 155/93  Pulse: 81 81 84 (!) 101  Resp: 16 16  20   Temp: 98.4 F (36.9 C) 98.1 F (36.7 C)  97.7 F (36.5 C)  TempSrc: Oral Oral  Oral  SpO2: 97% 98%  99%  Weight: 60.7 kg       Intake/Output Summary (Last 24 hours) at 10/15/2019 1943 Last data filed at 10/15/2019 0525 Gross per 24 hour  Intake 761.37 ml  Output 500 ml  Net 261.37 ml   Filed Weights   10/12/19 0530 10/14/19 0410 10/15/19 0514  Weight: 60.4 kg 60.9 kg 60.7 kg    Data Reviewed: I have personally reviewed and interpreted daily labs, tele strips, imagings as discussed above. I reviewed all nursing notes, pharmacy notes, vitals, pertinent old records I have discussed plan of care as described above with RN and patient/family.  CBC: Recent Labs  Lab 10/12/19 0447 10/12/19 0814 10/13/19 0351 10/14/19 0436 10/15/19 0430  WBC 2.9* 3.1* 3.5* 3.1* 4.5  NEUTROABS  --  1.6*  --  1.5* 2.5  HGB 7.8* 8.5* 8.1* 8.0* 8.0*  HCT 23.5* 25.7* 24.1* 24.2* 24.4*  MCV 99.2 98.8 99.2 99.2 100.8*  PLT 211 229 224 231 244   Basic Metabolic Panel: Recent Labs  Lab 10/10/19 0810 10/10/19 0810 10/11/19 0435 10/11/19 0809 10/12/19 0447 10/12/19 0447 10/13/19 0351 10/13/19 1434  10/14/19 1004 10/14/19 1355 10/14/19 1742 10/14/19 2227 10/15/19 0430  NA 129*   < > 128*   < > 132*   < > 130*   < > 129* 130* 130* 131* 130*  K 4.3   < > 4.4   < > 4.3   < > 5.2*   < > 4.5 4.6 4.9 5.1 5.0  CL 99   < > 99   < > 104   < > 99   < > 97* 97* 98 97* 98  CO2 23   < > 23   < > 22   < > 21*   < > 24 26 23 24 24   GLUCOSE 100*   < > 97   < > 88   < > 94   < > 192* 170* 138* 164* 106*  BUN 39*   < > 44*   < > 44*   < > 44*   < > 42* 49* 56* 59*  58*  CREATININE 1.56*   < > 1.82*   < > 1.60*   < > 1.73*   < > 1.64* 1.75* 1.89* 2.13* 2.02*  CALCIUM 8.9   < > 8.6*   < > 8.0*   < > 8.8*   < > 8.7* 8.6* 8.7* 8.9 8.6*  MG  --   --  1.8  --   --   --   --   --   --   --   --   --   --   PHOS 3.0  --  3.0  --  3.0  --  3.2  --   --   --   --   --   --    < > = values in this interval not displayed.    Studies: No results found.  Scheduled Meds: . acetaminophen  650 mg Oral TID  . vitamin C  500 mg Oral Daily  . aspirin  81 mg Oral Daily  . brimonidine  1 drop Both Eyes BID   And  . timolol  1 drop Both Eyes BID  . carvedilol  25 mg Oral BID  . cloNIDine  0.3 mg Oral Daily  . donepezil  10 mg Oral QHS  . doxazosin  16 mg Oral QHS  . escitalopram  10 mg Oral Daily  . feeding supplement (ENSURE ENLIVE)  237 mL Oral TID BM  . feeding supplement (PRO-STAT SUGAR FREE 64)  30 mL Oral BID  . ferrous sulfate  325 mg Oral Daily  . insulin aspart  0-9 Units Subcutaneous TID WC  . lactose free nutrition  237 mL Oral TID WC  . mirtazapine  15 mg Oral QHS  . polyvinyl alcohol  1 drop Both Eyes BID  . pravastatin  10 mg Oral q1800  . senna-docusate  1 tablet Oral QHS  . traZODone  50 mg Oral QHS  . zinc sulfate  220 mg Oral Daily   Continuous Infusions: . sodium chloride 125 mL/hr at 10/15/19 1407   PRN Meds: guaiFENesin-dextromethorphan, hydrALAZINE, HYDROcodone-acetaminophen, ondansetron **OR** ondansetron (ZOFRAN) IV  Time spent: 35 minutes  Author: Berle Mull, MD [facility] 10/15/2019 7:43 PM  To reach On-call, see care teams to locate the attending and reach out via [email or web address]. Between 7PM-7AM, please contact night-coverage If you still have difficulty reaching the attending provider, please page the [facility] (Director on Call) for [facility] on amion for assistance.

## 2019-10-15 NOTE — Progress Notes (Signed)
OT Cancellation Note  Patient Details Name: [name] MRN: [id number] DOB: 16-Aug-1932   Cancelled Treatment:    Reason Eval/Treat Not Completed: Other (comment) (Patient's son not present to intepret. Due to dementia patient does not do well with video ASL intepreter. Will f/u as able when son present.)  Lenward Chancellor 10/15/2019, 3:53 PM

## 2019-10-16 LAB — CBC WITH DIFFERENTIAL/PLATELET
Abs Immature Granulocytes: 0 10*3/uL (ref 0.00–0.07)
Basophils Absolute: 0 10*3/uL (ref 0.0–0.1)
Basophils Relative: 1 %
Eosinophils Absolute: 0.1 10*3/uL (ref 0.0–0.5)
Eosinophils Relative: 2 %
HCT: 23.1 % — ABNORMAL LOW (ref 36.0–46.0)
Hemoglobin: 7.6 g/dL — ABNORMAL LOW (ref 12.0–15.0)
Immature Granulocytes: 0 %
Lymphocytes Relative: 28 %
Lymphs Abs: 1.3 10*3/uL (ref 0.7–4.0)
MCH: 33 pg (ref 26.0–34.0)
MCHC: 32.9 g/dL (ref 30.0–36.0)
MCV: 100.4 fL — ABNORMAL HIGH (ref 80.0–100.0)
Monocytes Absolute: 0.6 10*3/uL (ref 0.1–1.0)
Monocytes Relative: 12 %
Neutro Abs: 2.6 10*3/uL (ref 1.7–7.7)
Neutrophils Relative %: 57 %
Platelets: 237 10*3/uL (ref 150–400)
RBC: 2.3 MIL/uL — ABNORMAL LOW (ref 3.87–5.11)
RDW: 14.2 % (ref 11.5–15.5)
WBC: 4.5 10*3/uL (ref 4.0–10.5)
nRBC: 0 % (ref 0.0–0.2)

## 2019-10-16 LAB — COMPREHENSIVE METABOLIC PANEL
ALT: 22 U/L (ref 0–44)
AST: 18 U/L (ref 15–41)
Albumin: 3.2 g/dL — ABNORMAL LOW (ref 3.5–5.0)
Alkaline Phosphatase: 52 U/L (ref 38–126)
Anion gap: 6 (ref 5–15)
BUN: 64 mg/dL — ABNORMAL HIGH (ref 8–23)
CO2: 24 mmol/L (ref 22–32)
Calcium: 8.9 mg/dL (ref 8.9–10.3)
Chloride: 101 mmol/L (ref 98–111)
Creatinine, Ser: 1.87 mg/dL — ABNORMAL HIGH (ref 0.44–1.00)
GFR calc Af Amer: 28 mL/min — ABNORMAL LOW (ref >60)
GFR calc non Af Amer: 24 mL/min — ABNORMAL LOW (ref >60)
Glucose, Bld: 103 mg/dL — ABNORMAL HIGH (ref 70–99)
Potassium: 4.9 mmol/L (ref 3.5–5.1)
Sodium: 131 mmol/L — ABNORMAL LOW (ref 135–145)
Total Bilirubin: 0.2 mg/dL — ABNORMAL LOW (ref 0.3–1.2)
Total Protein: 5.9 g/dL — ABNORMAL LOW (ref 6.5–8.1)

## 2019-10-16 LAB — GLUCOSE, CAPILLARY
Glucose-Capillary: 93 mg/dL (ref 70–99)
Glucose-Capillary: 101 mg/dL — ABNORMAL HIGH (ref 70–99)
Glucose-Capillary: 108 mg/dL — ABNORMAL HIGH (ref 70–99)
Glucose-Capillary: 204 mg/dL — ABNORMAL HIGH (ref 70–99)
Glucose-Capillary: 135 mg/dL — ABNORMAL HIGH (ref 70–99)

## 2019-10-16 LAB — FERRITIN: Ferritin: 59 ng/mL (ref 11–307)

## 2019-10-16 LAB — D-DIMER, QUANTITATIVE: D-Dimer, Quant: 3.33 ug/mL-FEU — ABNORMAL HIGH (ref 0.00–0.50)

## 2019-10-16 LAB — C-REACTIVE PROTEIN: CRP: 0.6 mg/dL (ref <1.0)

## 2019-10-16 MED ORDER — HEPARIN SODIUM (PORCINE) 5000 UNIT/ML IJ SOLN
5000.0000 [IU] | Freq: Three times a day (TID) | INTRAMUSCULAR | Status: DC
  Administered 2019-10-16 – 2019-10-21 (×15): 5000 [IU] via SUBCUTANEOUS
  Filled 2019-10-16 (×15): qty 1

## 2019-10-16 NOTE — Progress Notes (Signed)
Occupational Therapy Treatment Patient Details Name: [name] MRN: [id number] DOB: [birth year] Today's Date: 10/16/2019    History of present illness 86 yo female admitted with ARF, L shoulder pain 2* effusion/OA. Hx of deafness, dementia, DM, ulcers, syncopal episodes, ETOH abuse, CKD   OT comments  Patient's LUE presents with edema in upper arm today and therapist provided patient with exercises and active assist to reduce edema and maintain ROM of joint. Effusion felt on the anterior shoulder with palpation. Patient tolerated limited shoulder flexion and external rotation with active assist from therapist. Humeral head felt loose in the joint with crepitus felt. Therapist positioned upper extremity on pillows to assist with edema reduction. Rn notified.    Follow Up Recommendations  Home health OT;SNF    Equipment Recommendations       Recommendations for Other Services      Precautions / Restrictions Precautions Precaution Comments: L shoulder pain (poor functional use)       Mobility Bed Mobility                  Transfers                      Balance                                           ADL either performed or assessed with clinical judgement   ADL                                               Vision       Perception     Praxis      Cognition   Behavior During Therapy: [facility] for tasks assessed/performed Overall Cognitive Status: Difficult to assess                                          Exercises Shoulder Exercises Shoulder Flexion: AAROM;Supine (to 45 degrees) Shoulder External Rotation: AAROM;Supine (to neutral) Elbow Flexion: 20 reps;AROM (with visual and tactile cues to perform.) Elbow Extension: AROM;20 reps (with visual and tactile cues to perform) Wrist Extension: AROM;20 reps (with visual and tactile cues) Digit Composite Flexion: AROM;20 reps   Shoulder  Instructions       General Comments      Pertinent Vitals/ Pain       Pain Assessment: Faces Faces Pain Scale: Hurts a little bit Pain Location: left shoulder active shoulder movement Pain Descriptors / Indicators: Guarding;Discomfort  Home Living                                          Prior Functioning/Environment              Frequency  Min 2X/week        Progress Toward Goals  OT Goals(current goals can now be found in the care plan section)  Progress towards OT goals: Progressing toward goals     Plan Discharge plan remains appropriate    Co-evaluation  AM-PAC OT "6 Clicks" Daily Activity     Outcome Measure   Help from another person eating meals?: A Little Help from another person taking care of personal grooming?: A Little Help from another person toileting, which includes using toliet, bedpan, or urinal?: A Lot Help from another person bathing (including washing, rinsing, drying)?: A Lot Help from another person to put on and taking off regular upper body clothing?: A Lot Help from another person to put on and taking off regular lower body clothing?: A Lot 6 Click Score: 14    End of Session    OT Visit Diagnosis: Unsteadiness on feet (R26.81);Muscle weakness (generalized) (M62.81);Pain Pain - Right/Left: Left Pain - part of body: Shoulder   Activity Tolerance Patient limited by pain   Patient Left in bed;with call bell/phone within reach;with bed alarm set   Nurse Communication  (Discussed patient' LUE)        Time: 5366-4403 OT Time Calculation (min): 14 min  Charges: OT General Charges $OT Visit: 1 Visit OT Treatments $Therapeutic Exercise: 8-22 mins  Ava Tangney, OTR/L [place]  Office [phone] Pager: [place] 10/16/2019, 5:09 PM

## 2019-10-16 NOTE — Progress Notes (Addendum)
PROGRESS NOTE  [name]  HKV:[id number] DOB: 17-Sep-1932 DOA: 10/07/2019 PCP: Lynnell Catalan, FNP   Brief Narrative: [name] is an 86 y.o. female with a history of dementia, deafness, T2DM, stage IIIb CKD, and HTN who presented to the ED 6/14 from [facility] with    Assessment & Plan: Principal Problem:   ARF (acute renal failure) ([place]) Active Problems:   Hyperkalemia   Swelling of joint of left shoulder   Hypertensive urgency   DM type 2 (diabetes mellitus, type 2) (HCC)   Dementia (HCC)   Acute renal failure (ARF) (HCC)   Pressure injury of skin   AKI (acute kidney injury) ([place])  AKI on stage IIIb CKD: Baseline SCr presumed to have been 1.2 based on remote value. SCr 1.8 on admission and only transiently improved from that. renal U/S showed echogenic kidneys suggesting advanced chronic medical renal disease, without acute finding. This may suggest that CKD has advanced and the patient does not have AKI per se. - Continue normal saline until po intake normalizes.  - Monitor BMP in AM, avoid nephrotoxins  Left shoulder osteoarthritis with effusion s/p 30cc drainage by IR on 6/17. Studies not concerning for septic arthritis. Culture negative, gram stain negative. - Continue PT/OT, ROM. Recommend orthopedic surgery follow up after discharge. - Continue pain control. - continue LUE elevation to treat edema with medative U/S for DVT or SVT.   SARS-CoV-2 positivity: Ct value is 39 consistent with very small concentration of nucleic acid target on PCR. CRP not elevated. No respiratory symptoms. Suspect asymptomatic infection vs. noninfectious viral shedding. No PNA on CXR. - Continue isolation for 10 days from date of positive test given the patient does not have severe symptoms. - With the patient suffering from both dementia and deafness, she has no effective means of communication without in-person ASL interpretation. With full informed consent regarding risks of  transmission, her son may be allowed visitation with strict precautions to otherwise remain quarantined. - No indication for antiviral Tx or steroids at this time.  - Add heparin Erda in the absence of bleeding.  NIDT2DM:  - Continue SSI, holding metformin with AKI and CKD.  - Check HbA1c in AM (last was done in 2012)  HTN with HTN urgency: Improved.  - Continue coreg, clonidine, at inpatient goal.  HLD:  - Continue statin  Dementia and depression: At very high risk of delirium with visitation restrictions and language barrier (deafness with inattention limiting use of video remote interpretors). - Continue aricept, SSRI, trazodone, remeron  Glaucoma:  - Continue gtt's  Iron deficiency anemia and anemia of CKD:  - Continue iron supplementation. - With macrocytic indices, check F64, folic acid.   Hyponatremia: Chronic.  - Monitor on isotonic saline.  Hyperkalemia: Resolved with treatment  Left buttock pressure ulcer stage I POA. Continue foam dressing. Pressure Injury 10/08/19 Buttocks Right;Left Stage 1 -  Intact skin with non-blanchable redness of a localized area usually over a bony prominence. (Active)  10/08/19 1300  Location: Buttocks  Location Orientation: Right;Left  Staging: Stage 1 -  Intact skin with non-blanchable redness of a localized area usually over a bony prominence.  Wound Description (Comments):   Present on Admission: Yes   DVT prophylaxis: Heparin [place] Code Status: Full code (was previously on hospice which has been revoked) Family Communication: None at bedside, will contact family as able Disposition Plan:  Status is: Inpatient  Remains inpatient appropriate because:Inpatient level of care appropriate due to severity of illness  Dispo: The patient is from: SNF              Anticipated d/c is to: SNF              Anticipated d/c date is: 1 day              Patient currently is not medically stable to d/c.  Consultants:   IR  Procedures:    Left shoulder fluoroscopic aspiration  Antimicrobials:  None   Subjective: Confused and deaf. Communicated with sign with common pictographic representations and large words yes/no, though not clear whether this was accurately helpful. She did not appear to have any complaints.  Objective: Vitals:   10/15/19 2321 10/16/19 0524 10/16/19 1017 10/16/19 1416  BP: (!) 144/83 (!) 148/89 132/82 119/82  Pulse: 97 84 74 72  Resp: 17 17 18 20   Temp: 98.4 F (36.9 C) 98.3 F (36.8 C) 98 F (36.7 C) 98 F (36.7 C)  TempSrc: Oral Oral Oral Oral  SpO2: 100% 99% 98% 100%  Weight:        Intake/Output Summary (Last 24 hours) at 10/16/2019 1849 Last data filed at 10/16/2019 1500 Gross per 24 hour  Intake 4646.37 ml  Output 1600 ml  Net 3046.37 ml   Filed Weights   10/12/19 0530 10/14/19 0410 10/15/19 0514  Weight: 60.4 kg 60.9 kg 60.7 kg    Gen: Elderly, deaf female in no distress Pulm: Non-labored breathing. Clear to auscultation bilaterally.  CV: Regular rate and rhythm. No murmur, rub, or gallop. No JVD, no pitting pedal edema. GI: Abdomen soft, non-tender, non-distended, with normoactive bowel sounds. No organomegaly or masses felt. Ext: Warm, no deformities. Slight crepitus and tenderness to anterior left GH joint, no overlying erythema. Skin: No rashes, lesions or ulcers Neuro: Alert and disoriented. Total hearing loss. No definite focal neurological deficits on limited exam. Psych: Judgement and insight appear impaired. Mood & affect appropriate.   Data Reviewed: I have personally reviewed following labs and imaging studies  CBC: Recent Labs  Lab 10/12/19 0814 10/13/19 0351 10/14/19 0436 10/15/19 0430 10/16/19 0507  WBC 3.1* 3.5* 3.1* 4.5 4.5  NEUTROABS 1.6*  --  1.5* 2.5 2.6  HGB 8.5* 8.1* 8.0* 8.0* 7.6*  HCT 25.7* 24.1* 24.2* 24.4* 23.1*  MCV 98.8 99.2 99.2 100.8* 100.4*  PLT 229 224 231 274 762   Basic Metabolic Panel: Recent Labs  Lab 10/10/19 0810  10/10/19 0810 10/11/19 0435 10/11/19 0809 10/12/19 0447 10/12/19 0447 10/13/19 0351 10/13/19 1434 10/14/19 1355 10/14/19 1742 10/14/19 2227 10/15/19 0430 10/16/19 0507  NA 129*   < > 128*   < > 132*   < > 130*   < > 130* 130* 131* 130* 131*  K 4.3   < > 4.4   < > 4.3   < > 5.2*   < > 4.6 4.9 5.1 5.0 4.9  CL 99   < > 99   < > 104   < > 99   < > 97* 98 97* 98 101  CO2 23   < > 23   < > 22   < > 21*   < > 26 23 24 24 24   GLUCOSE 100*   < > 97   < > 88   < > 94   < > 170* 138* 164* 106* 103*  BUN 39*   < > 44*   < > 44*   < > 44*   < > 49* 56*  59* 58* 64*  CREATININE 1.56*   < > 1.82*   < > 1.60*   < > 1.73*   < > 1.75* 1.89* 2.13* 2.02* 1.87*  CALCIUM 8.9   < > 8.6*   < > 8.0*   < > 8.8*   < > 8.6* 8.7* 8.9 8.6* 8.9  MG  --   --  1.8  --   --   --   --   --   --   --   --   --   --   PHOS 3.0  --  3.0  --  3.0  --  3.2  --   --   --   --   --   --    < > = values in this interval not displayed.   GFR: CrCl cannot be calculated (Unknown ideal weight.). Liver Function Tests: Recent Labs  Lab 10/13/19 0351 10/13/19 1434 10/14/19 0436 10/15/19 0430 10/16/19 0507  AST  --  18 18 25 18   ALT  --  17 17 24 22   ALKPHOS  --  52 48 54 52  BILITOT  --  0.2* 0.4 0.5 0.2*  PROT  --  5.8* 5.5* 5.6* 5.9*  ALBUMIN 3.0* 3.0* 2.9* 3.0* 3.2*   No results for input(s): LIPASE, AMYLASE in the last 168 hours. No results for input(s): AMMONIA in the last 168 hours. Coagulation Profile: No results for input(s): INR, PROTIME in the last 168 hours. Cardiac Enzymes: No results for input(s): CKTOTAL, CKMB, CKMBINDEX, TROPONINI in the last 168 hours. BNP (last 3 results) No results for input(s): PROBNP in the last 8760 hours. HbA1C: No results for input(s): HGBA1C in the last 72 hours. CBG: Recent Labs  Lab 10/15/19 2119 10/16/19 0831 10/16/19 1156 10/16/19 1652 10/16/19 1814  GLUCAP 166* 93 101* 108* 204*   Lipid Profile: No results for input(s): CHOL, HDL, LDLCALC, TRIG, CHOLHDL,  LDLDIRECT in the last 72 hours. Thyroid Function Tests: No results for input(s): TSH, T4TOTAL, FREET4, T3FREE, THYROIDAB in the last 72 hours. Anemia Panel: Recent Labs    10/15/19 0430 10/16/19 0507  FERRITIN 59 59   Urine analysis:    Component Value Date/Time   COLORURINE YELLOW 08/22/2017 1219   APPEARANCEUR HAZY (A) 08/22/2017 1219   LABSPEC 1.023 08/22/2017 1219   PHURINE 6.0 08/22/2017 1219   GLUCOSEU NEGATIVE 08/22/2017 1219   HGBUR NEGATIVE 08/22/2017 1219   BILIRUBINUR NEGATIVE 08/22/2017 1219   KETONESUR NEGATIVE 08/22/2017 1219   PROTEINUR NEGATIVE 08/22/2017 1219   UROBILINOGEN 0.2 12/22/2010 1421   NITRITE NEGATIVE 08/22/2017 1219   LEUKOCYTESUR SMALL (A) 08/22/2017 1219   Recent Results (from the past 240 hour(s))  SARS Coronavirus 2 by RT PCR (hospital order, performed in [facility] hospital lab) Nasopharyngeal Nasopharyngeal Swab     Status: None   Collection Time: 10/07/19  9:24 PM   Specimen: Nasopharyngeal Swab  Result Value Ref Range Status   SARS Coronavirus 2 NEGATIVE NEGATIVE Final    Comment: (NOTE) SARS-CoV-2 target nucleic acids are NOT DETECTED.  The SARS-CoV-2 RNA is generally detectable in upper and lower respiratory specimens during the acute phase of infection. The lowest concentration of SARS-CoV-2 viral copies this assay can detect is 250 copies / mL. A negative result does not preclude SARS-CoV-2 infection and should not be used as the sole basis for treatment or other patient management decisions.  A negative result may occur with improper specimen collection / handling, submission of specimen other  than nasopharyngeal swab, presence of viral mutation(s) within the areas targeted by this assay, and inadequate number of viral copies (<250 copies / mL). A negative result must be combined with clinical observations, patient history, and epidemiological information.  Fact Sheet for Patients:    StrictlyIdeas.no  Fact Sheet for Healthcare Providers: BankingDealers.co.za  This test is not yet approved or  cleared by the [place] FDA and has been authorized for detection and/or diagnosis of SARS-CoV-2 by FDA under an Emergency Use Authorization (EUA).  This EUA will remain in effect (meaning this test can be used) for the duration of the COVID-19 declaration under Section 564(b)(1) of the Act, 21 U.S.C. section 360bbb-3(b)(1), unless the authorization is terminated or revoked sooner.  Performed at [facility], [place] [address], [place], [place] [address]   Body fluid culture     Status: None   Collection Time: 10/10/19  9:00 AM   Specimen: Joint, Left Shoulder; Synovial Fluid  Result Value Ref Range Status   Specimen Description   Final    FLUID SYNOVIAL LEFT SHOULDER Performed at [facility] [address], [place], [place] [address]    Special Requests   Final    NONE Performed at [facility], [place] [address], [place], [place] [address]    Gram Stain   Final    MODERATE WBC PRESENT,BOTH PMN AND MONONUCLEAR NO ORGANISMS SEEN Gram Stain Report Called to,Read Back By and Verified With: BULLINS,H @ 8841 ON [id number] BY POTEAT,S Performed at [facility] [address], [place], [place] [address]    Culture   Final    NO GROWTH 3 DAYS Performed at [facility], [place] [address] [place], [place] [address]    Report Status 10/13/2019 FINAL  Final  SARS CORONAVIRUS 2 (TAT 6-24 HRS) Nasopharyngeal Nasopharyngeal Swab     Status: Abnormal   Collection Time: 10/12/19  7:33 PM   Specimen: Nasopharyngeal Swab  Result Value Ref Range Status   SARS Coronavirus 2 POSITIVE (A) NEGATIVE Final    Comment: RESULT CALLED TO, READ BACK BY AND VERIFIED WITH: Aquilla Hacker [id number] 10/13/2019 T. TYSOR (NOTE) SARS-CoV-2 target nucleic acids are DETECTED.  The  SARS-CoV-2 RNA is generally detectable in upper and lower respiratory specimens during the acute phase of infection. Positive results are indicative of the presence of SARS-CoV-2 RNA. Clinical correlation with patient history and other diagnostic information is  necessary to determine patient infection status. Positive results do not rule out bacterial infection or co-infection with other viruses.  The expected result is Negative.  Fact Sheet for Patients: SugarRoll.be  Fact Sheet for Healthcare Providers: https://www.woods-mathews.com/  This test is not yet approved or cleared by the [place] FDA and  has been authorized for detection and/or diagnosis of SARS-CoV-2 by FDA under an Emergency Use Authorization (EUA). This EUA will remain  in effect (meaning this test can be used ) for the duration of the COVID-19 declaration under Section 564(b)(1) of the Act, 21 U.S.C. section 360bbb-3(b)(1), unless the authorization is terminated or revoked sooner.   Performed at [facility], [place] [address] [place] [address], [place], [place] [address]   SARS Coronavirus 2 by RT PCR (hospital order, performed in [facility] hospital lab) Nasopharyngeal Nasopharyngeal Swab     Status: Abnormal   Collection Time: 10/13/19  8:39 AM   Specimen: Nasopharyngeal Swab  Result Value Ref Range Status   SARS Coronavirus 2 POSITIVE (A) NEGATIVE Final  Comment: RESULT CALLED TO, READ BACK BY AND VERIFIED WITH: SAUNDARS,L RN @1252  ON 10/13/19 JACKSON,K (NOTE) SARS-CoV-2 target nucleic acids are DETECTED  SARS-CoV-2 RNA is generally detectable in upper respiratory specimens  during the acute phase of infection.  Positive results are indicative  of the presence of the identified virus, but do not rule out bacterial infection or co-infection with other pathogens not detected by the test.  Clinical correlation with patient history and  other diagnostic information is  necessary to determine patient infection status.  The expected result is negative.  Fact Sheet for Patients:   StrictlyIdeas.no   Fact Sheet for Healthcare Providers:   BankingDealers.co.za    This test is not yet approved or cleared by the [place] FDA and  has been authorized for detection and/or diagnosis of SARS-CoV-2 by FDA under an Emergency Use Authorization (EUA).  This EUA will remain in effect (meaning t his test can be used) for the duration of  the COVID-19 declaration under Section 564(b)(1) of the Act, 21 U.S.C. section 360-bbb-3(b)(1), unless the authorization is terminated or revoked sooner.  Performed at [facility], [place] [address] [place], [place] [address]       Radiology Studies: No results found.  Scheduled Meds: . acetaminophen  650 mg Oral TID  . vitamin C  500 mg Oral Daily  . aspirin  81 mg Oral Daily  . brimonidine  1 drop Both Eyes BID   And  . timolol  1 drop Both Eyes BID  . carvedilol  25 mg Oral BID  . cloNIDine  0.3 mg Oral Daily  . donepezil  10 mg Oral QHS  . doxazosin  16 mg Oral QHS  . escitalopram  10 mg Oral Daily  . feeding supplement (ENSURE ENLIVE)  237 mL Oral TID BM  . feeding supplement (PRO-STAT SUGAR FREE 64)  30 mL Oral BID  . ferrous sulfate  325 mg Oral Daily  . insulin aspart  0-9 Units Subcutaneous TID WC  . lactose free nutrition  237 mL Oral TID WC  . mirtazapine  15 mg Oral QHS  . polyvinyl alcohol  1 drop Both Eyes BID  . pravastatin  10 mg Oral q1800  . senna-docusate  1 tablet Oral QHS  . traZODone  50 mg Oral QHS  . zinc sulfate  220 mg Oral Daily   Continuous Infusions: . sodium chloride 100 mL/hr at 10/16/19 1647     LOS: 7 days   Time spent: 35 minutes.  Patrecia Pour, MD [facility] [email or web address] 10/16/2019, 6:49 PM

## 2019-10-17 DIAGNOSIS — E119 Type 2 diabetes mellitus without complications: Secondary | ICD-10-CM

## 2019-10-17 LAB — CBC WITH DIFFERENTIAL/PLATELET
Abs Immature Granulocytes: 0.01 10*3/uL (ref 0.00–0.07)
Basophils Absolute: 0 10*3/uL (ref 0.0–0.1)
Basophils Relative: 1 %
Eosinophils Absolute: 0.1 10*3/uL (ref 0.0–0.5)
Eosinophils Relative: 4 %
HCT: 22.1 % — ABNORMAL LOW (ref 36.0–46.0)
Hemoglobin: 7.2 g/dL — ABNORMAL LOW (ref 12.0–15.0)
Immature Granulocytes: 0 %
Lymphocytes Relative: 37 %
Lymphs Abs: 1.4 10*3/uL (ref 0.7–4.0)
MCH: 33.5 pg (ref 26.0–34.0)
MCHC: 32.6 g/dL (ref 30.0–36.0)
MCV: 102.8 fL — ABNORMAL HIGH (ref 80.0–100.0)
Monocytes Absolute: 0.4 10*3/uL (ref 0.1–1.0)
Monocytes Relative: 11 %
Neutro Abs: 1.7 10*3/uL (ref 1.7–7.7)
Neutrophils Relative %: 47 %
Platelets: 222 10*3/uL (ref 150–400)
RBC: 2.15 MIL/uL — ABNORMAL LOW (ref 3.87–5.11)
RDW: 14.4 % (ref 11.5–15.5)
WBC: 3.6 10*3/uL — ABNORMAL LOW (ref 4.0–10.5)
nRBC: 0 % (ref 0.0–0.2)

## 2019-10-17 LAB — VITAMIN B12: Vitamin B-12: 470 pg/mL (ref 180–914)

## 2019-10-17 LAB — COMPREHENSIVE METABOLIC PANEL
ALT: 20 U/L (ref 0–44)
AST: 18 U/L (ref 15–41)
Albumin: 2.9 g/dL — ABNORMAL LOW (ref 3.5–5.0)
Alkaline Phosphatase: 50 U/L (ref 38–126)
Anion gap: 4 — ABNORMAL LOW (ref 5–15)
BUN: 59 mg/dL — ABNORMAL HIGH (ref 8–23)
CO2: 24 mmol/L (ref 22–32)
Calcium: 8.9 mg/dL (ref 8.9–10.3)
Chloride: 105 mmol/L (ref 98–111)
Creatinine, Ser: 1.76 mg/dL — ABNORMAL HIGH (ref 0.44–1.00)
GFR calc Af Amer: 30 mL/min — ABNORMAL LOW (ref >60)
GFR calc non Af Amer: 26 mL/min — ABNORMAL LOW (ref >60)
Glucose, Bld: 92 mg/dL (ref 70–99)
Potassium: 5 mmol/L (ref 3.5–5.1)
Sodium: 133 mmol/L — ABNORMAL LOW (ref 135–145)
Total Bilirubin: 0.5 mg/dL (ref 0.3–1.2)
Total Protein: 5.5 g/dL — ABNORMAL LOW (ref 6.5–8.1)

## 2019-10-17 LAB — RETICULOCYTES
Immature Retic Fract: 20.5 % — ABNORMAL HIGH (ref 2.3–15.9)
RBC.: 2.18 MIL/uL — ABNORMAL LOW (ref 3.87–5.11)
Retic Count, Absolute: 41.4 10*3/uL (ref 19.0–186.0)
Retic Ct Pct: 1.9 % (ref 0.4–3.1)

## 2019-10-17 LAB — GLUCOSE, CAPILLARY
Glucose-Capillary: 97 mg/dL (ref 70–99)
Glucose-Capillary: 216 mg/dL — ABNORMAL HIGH (ref 70–99)
Glucose-Capillary: 109 mg/dL — ABNORMAL HIGH (ref 70–99)
Glucose-Capillary: 127 mg/dL — ABNORMAL HIGH (ref 70–99)

## 2019-10-17 LAB — IRON AND TIBC
Iron: 40 ug/dL (ref 28–170)
Saturation Ratios: 16 % (ref 10.4–31.8)
TIBC: 252 ug/dL (ref 250–450)
UIBC: 212 ug/dL

## 2019-10-17 LAB — D-DIMER, QUANTITATIVE: D-Dimer, Quant: 3.16 ug/mL-FEU — ABNORMAL HIGH (ref 0.00–0.50)

## 2019-10-17 LAB — HEMOGLOBIN A1C
Hgb A1c MFr Bld: 5.8 % — ABNORMAL HIGH (ref 4.8–5.6)
Mean Plasma Glucose: 119.76 mg/dL

## 2019-10-17 LAB — FERRITIN: Ferritin: 55 ng/mL (ref 11–307)

## 2019-10-17 LAB — FOLATE: Folate: 6.7 ng/mL (ref >5.9)

## 2019-10-17 MED ORDER — FOLIC ACID 1 MG PO TABS
1.0000 mg | ORAL_TABLET | Freq: Every day | ORAL | Status: DC
  Administered 2019-10-17 – 2019-10-21 (×5): 1 mg via ORAL
  Filled 2019-10-17 (×5): qty 1

## 2019-10-17 MED ORDER — VITAMIN B-12 1000 MCG PO TABS
500.0000 ug | ORAL_TABLET | Freq: Every day | ORAL | Status: DC
  Administered 2019-10-17 – 2019-10-21 (×5): 500 ug via ORAL
  Filled 2019-10-17 (×5): qty 1

## 2019-10-17 NOTE — Progress Notes (Signed)
PROGRESS NOTE  [name]  ZOX:[id number] DOB: Jun 30, 1932 DOA: 10/07/2019 PCP: Lynnell Catalan, FNP   Brief Narrative: [name] is an 86 y.o. female with a history of dementia, deafness, T2DM, stage IIIb CKD, and HTN who presented to the ED 6/14 from [facility] with left shoulder pain found to have effusion and AKI.  Assessment & Plan: Principal Problem:   ARF (acute renal failure) (HCC) Active Problems:   Hyperkalemia   Swelling of joint of left shoulder   Hypertensive urgency   DM type 2 (diabetes mellitus, type 2) (HCC)   Dementia (HCC)   Acute renal failure (ARF) (HCC)   Pressure injury of skin   AKI (acute kidney injury) ([place])  AKI on stage IIIb CKD: Baseline SCr presumed to have been 1.2 based on remote value. SCr 1.8 on admission and only transiently improved from that. renal U/S showed echogenic kidneys suggesting advanced chronic medical renal disease, without acute finding. This may suggest that CKD has advanced to stage IV. - Can DC NS gtt - Monitor BMP in AM, avoid nephrotoxins  Left shoulder osteoarthritis with effusion s/p 30cc drainage by IR on 6/17. Studies not concerning for septic arthritis. Culture negative, gram stain negative. - Continue PT/OT, ROM. Recommend orthopedic surgery follow up after discharge. - Continue pain control. - continue LUE elevation to treat edema with medative U/S for DVT or SVT.   SARS-CoV-2 positivity: Ct value is 39 consistent with very small concentration of nucleic acid target on PCR. CRP not elevated. No respiratory symptoms. Suspect asymptomatic infection vs. noninfectious viral shedding. No PNA on CXR. - Continue isolation for 10 days from date of positive test given the patient does not have severe symptoms. - With the patient suffering from both dementia and deafness, she has no effective means of communication without in-person ASL interpretation. With full informed consent regarding risks of transmission, her son  may be allowed visitation with strict precautions to otherwise remain quarantined. - No indication for antiviral Tx or steroids at this time.  - Added heparin [place] in the absence of bleeding.  NIDT2DM: Improved, HbA1c 5.8%. - Continue SSI, will discontinue metformin with CKD.   HTN with HTN urgency: Improved.  - Continue coreg, clonidine, at inpatient goal.  HLD:  - Continue statin  Dementia and depression: At very high risk of delirium with visitation restrictions and language barrier (deafness with inattention limiting use of video remote interpretors). - Continue aricept, SSRI, trazodone, remeron  Glaucoma:  - Continue gtt's  Iron deficiency anemia and anemia of CKD: Iron, folate, B12 levels wnl.  - Worsening, will check FOBT with next stool though no gross bleeding has been noted. Given elevated d-dimer, aggressive goals of care, hospitalization/immobility, and active covid, will continue DVT ppx.  - Continue iron supplementation. - Recheck in AM. Discussed transfusion with son who consents to its use if hgb worsens. Will benefit from nephrology follow up as outpatient and likely starting EPO.  Hyponatremia: Chronic, improved.  - Monitor on isotonic saline.  Hyperkalemia: Resolved with treatment  Left buttock pressure ulcer stage I POA. Continue foam dressing. Pressure Injury 10/08/19 Buttocks Right;Left Stage 1 -  Intact skin with non-blanchable redness of a localized area usually over a bony prominence. (Active)  10/08/19 1300  Location: Buttocks  Location Orientation: Right;Left  Staging: Stage 1 -  Intact skin with non-blanchable redness of a localized area usually over a bony prominence.  Wound Description (Comments):   Present on Admission: Yes   DVT prophylaxis:  Heparin [place] Code Status: Full code (was previously on hospice which has been revoked) Family Communication: None at bedside, spoke with son by phone this evening Disposition Plan:  Status is:  Inpatient  Remains inpatient appropriate because:Inpatient level of care appropriate due to severity of illness   Dispo: The patient is from: SNF              Anticipated d/c is to: SNF              Anticipated d/c date is: 1 day              Patient currently is not medically stable to d/c.  Consultants:   IR  Procedures:   Left shoulder fluoroscopic aspiration  Antimicrobials:  None   Subjective: Limited communication, gives thumbs up. Unable to reliably use pictures, but wrote on paper with sharpie. She denies trouble breathing, pain anywhere.   Objective: Vitals:   10/17/19 0500 10/17/19 0631 10/17/19 0900 10/17/19 1234  BP:  (!) 165/86  101/69  Pulse:  68  64  Resp:  17  17  Temp:  98.2 F (36.8 C)  97.8 F (36.6 C)  TempSrc:  Oral  Oral  SpO2:  98%  100%  Weight: 64.4 kg     Height:   5' 2.5" (1.588 m)     Intake/Output Summary (Last 24 hours) at 10/17/2019 1847 Last data filed at 10/17/2019 1700 Gross per 24 hour  Intake 240 ml  Output 1300 ml  Net -1060 ml   Filed Weights   10/14/19 0410 10/15/19 0514 10/17/19 0500  Weight: 60.9 kg 60.7 kg 64.4 kg   Gen: 86 y.o. female in no distress Pulm: Nonlabored breathing room air. Clear. CV: Regular rate and rhythm. No murmur, rub, or gallop. No JVD, no dependent edema. GI: Abdomen soft, non-tender, non-distended, with normoactive bowel sounds.  Ext: Warm, no deformities Skin: No rashes, lesions or ulcers on visualized skin. Neuro: Alert and disoriented, deaf, no new focal neurological deficits. Psych: Judgement and insight appear impaired. Mood euthymic & affect congruent. Behavior is appropriate.    Data Reviewed: I have personally reviewed following labs and imaging studies  CBC: Recent Labs  Lab 10/12/19 0814 10/12/19 0814 10/13/19 0351 10/14/19 0436 10/15/19 0430 10/16/19 0507 10/17/19 0448  WBC 3.1*   < > 3.5* 3.1* 4.5 4.5 3.6*  NEUTROABS 1.6*  --   --  1.5* 2.5 2.6 1.7  HGB 8.5*   < > 8.1*  8.0* 8.0* 7.6* 7.2*  HCT 25.7*   < > 24.1* 24.2* 24.4* 23.1* 22.1*  MCV 98.8   < > 99.2 99.2 100.8* 100.4* 102.8*  PLT 229   < > 224 231 274 237 222   < > = values in this interval not displayed.   Basic Metabolic Panel: Recent Labs  Lab 10/11/19 0435 10/11/19 0809 10/12/19 0447 10/12/19 0447 10/13/19 0351 10/13/19 1434 10/14/19 1742 10/14/19 2227 10/15/19 0430 10/16/19 0507 10/17/19 0448  NA 128*   < > 132*   < > 130*   < > 130* 131* 130* 131* 133*  K 4.4   < > 4.3   < > 5.2*   < > 4.9 5.1 5.0 4.9 5.0  CL 99   < > 104   < > 99   < > 98 97* 98 101 105  CO2 23   < > 22   < > 21*   < > 23 24 24 24 24   GLUCOSE 97   < >  88   < > 94   < > 138* 164* 106* 103* 92  BUN 44*   < > 44*   < > 44*   < > 56* 59* 58* 64* 59*  CREATININE 1.82*   < > 1.60*   < > 1.73*   < > 1.89* 2.13* 2.02* 1.87* 1.76*  CALCIUM 8.6*   < > 8.0*   < > 8.8*   < > 8.7* 8.9 8.6* 8.9 8.9  MG 1.8  --   --   --   --   --   --   --   --   --   --   PHOS 3.0  --  3.0  --  3.2  --   --   --   --   --   --    < > = values in this interval not displayed.   GFR: Estimated Creatinine Clearance: 20.8 mL/min (A) (by C-G formula based on SCr of 1.76 mg/dL (H)). Liver Function Tests: Recent Labs  Lab 10/13/19 1434 10/14/19 0436 10/15/19 0430 10/16/19 0507 10/17/19 0448  AST 18 18 25 18 18   ALT 17 17 24 22 20   ALKPHOS 52 48 54 52 50  BILITOT 0.2* 0.4 0.5 0.2* 0.5  PROT 5.8* 5.5* 5.6* 5.9* 5.5*  ALBUMIN 3.0* 2.9* 3.0* 3.2* 2.9*   No results for input(s): LIPASE, AMYLASE in the last 168 hours. No results for input(s): AMMONIA in the last 168 hours. Coagulation Profile: No results for input(s): INR, PROTIME in the last 168 hours. Cardiac Enzymes: No results for input(s): CKTOTAL, CKMB, CKMBINDEX, TROPONINI in the last 168 hours. BNP (last 3 results) No results for input(s): PROBNP in the last 8760 hours. HbA1C: Recent Labs    10/17/19 0448  HGBA1C 5.8*   CBG: Recent Labs  Lab 10/16/19 1814 10/16/19 2108  10/17/19 0733 10/17/19 1215 10/17/19 1630  GLUCAP 204* 135* 97 216* 109*   Lipid Profile: No results for input(s): CHOL, HDL, LDLCALC, TRIG, CHOLHDL, LDLDIRECT in the last 72 hours. Thyroid Function Tests: No results for input(s): TSH, T4TOTAL, FREET4, T3FREE, THYROIDAB in the last 72 hours. Anemia Panel: Recent Labs    10/16/19 0507 10/17/19 0448  VITAMINB12  --  470  FOLATE  --  6.7  FERRITIN 59 55  TIBC  --  252  IRON  --  40  RETICCTPCT  --  1.9   Urine analysis:    Component Value Date/Time   COLORURINE YELLOW 08/22/2017 1219   APPEARANCEUR HAZY (A) 08/22/2017 1219   LABSPEC 1.023 08/22/2017 1219   PHURINE 6.0 08/22/2017 1219   GLUCOSEU NEGATIVE 08/22/2017 1219   HGBUR NEGATIVE 08/22/2017 1219   BILIRUBINUR NEGATIVE 08/22/2017 1219   KETONESUR NEGATIVE 08/22/2017 1219   PROTEINUR NEGATIVE 08/22/2017 1219   UROBILINOGEN 0.2 12/22/2010 1421   NITRITE NEGATIVE 08/22/2017 1219   LEUKOCYTESUR SMALL (A) 08/22/2017 1219   Recent Results (from the past 240 hour(s))  SARS Coronavirus 2 by RT PCR (hospital order, performed in [place] hospital lab) Nasopharyngeal Nasopharyngeal Swab     Status: None   Collection Time: 10/07/19  9:24 PM   Specimen: Nasopharyngeal Swab  Result Value Ref Range Status   SARS Coronavirus 2 NEGATIVE NEGATIVE Final    Comment: (NOTE) SARS-CoV-2 target nucleic acids are NOT DETECTED.  The SARS-CoV-2 RNA is generally detectable in upper and lower respiratory specimens during the acute phase of infection. The lowest concentration of SARS-CoV-2 viral copies this assay can detect  is 250 copies / mL. A negative result does not preclude SARS-CoV-2 infection and should not be used as the sole basis for treatment or other patient management decisions.  A negative result may occur with improper specimen collection / handling, submission of specimen other than nasopharyngeal swab, presence of viral mutation(s) within the areas targeted by this  assay, and inadequate number of viral copies (<250 copies / mL). A negative result must be combined with clinical observations, patient history, and epidemiological information.  Fact Sheet for Patients:   StrictlyIdeas.no  Fact Sheet for Healthcare Providers: BankingDealers.co.za  This test is not yet approved or  cleared by the [place] FDA and has been authorized for detection and/or diagnosis of SARS-CoV-2 by FDA under an Emergency Use Authorization (EUA).  This EUA will remain in effect (meaning this test can be used) for the duration of the COVID-19 declaration under Section 564(b)(1) of the Act, 21 U.S.C. section 360bbb-3(b)(1), unless the authorization is terminated or revoked sooner.  Performed at [facility], [place] [address] [place], [place] [address]   Body fluid culture     Status: None   Collection Time: 10/10/19  9:00 AM   Specimen: Joint, Left Shoulder; Synovial Fluid  Result Value Ref Range Status   Specimen Description   Final    FLUID SYNOVIAL LEFT SHOULDER Performed at [place] [address], [place], [place] [address]    Special Requests   Final    NONE Performed at [facility], [place] [address], [place], [place] [address]    Gram Stain   Final    MODERATE WBC PRESENT,BOTH PMN AND MONONUCLEAR NO ORGANISMS SEEN Gram Stain Report Called to,Read Back By and Verified With: BULLINS,H @ 6195 ON [id number] BY POTEAT,S Performed at [place] [facility] [address], [place], [place] [address]    Culture   Final    NO GROWTH 3 DAYS Performed at [facility], [place] [address], [place], [place] [address]    Report Status 10/13/2019 FINAL  Final  SARS CORONAVIRUS 2 (TAT 6-24 HRS) Nasopharyngeal Nasopharyngeal Swab     Status: Abnormal   Collection Time: 10/12/19  7:33 PM   Specimen: Nasopharyngeal Swab  Result Value Ref Range Status    SARS Coronavirus 2 POSITIVE (A) NEGATIVE Final    Comment: RESULT CALLED TO, READ BACK BY AND VERIFIED WITH: Aquilla Hacker [id number] 10/13/2019 T. TYSOR (NOTE) SARS-CoV-2 target nucleic acids are DETECTED.  The SARS-CoV-2 RNA is generally detectable in upper and lower respiratory specimens during the acute phase of infection. Positive results are indicative of the presence of SARS-CoV-2 RNA. Clinical correlation with patient history and other diagnostic information is  necessary to determine patient infection status. Positive results do not rule out bacterial infection or co-infection with other viruses.  The expected result is Negative.  Fact Sheet for Patients: SugarRoll.be  Fact Sheet for Healthcare Providers: https://www.woods-mathews.com/  This test is not yet approved or cleared by the [place] FDA and  has been authorized for detection and/or diagnosis of SARS-CoV-2 by FDA under an Emergency Use Authorization (EUA). This EUA will remain  in effect (meaning this test can be used ) for the duration of the COVID-19 declaration under Section 564(b)(1) of the Act, 21 U.S.C. section 360bbb-3(b)(1), unless the authorization is terminated or revoked sooner.   Performed at [facility], [place] [address] [place], [place] [address]   SARS Coronavirus 2 by RT PCR (hospital  order, performed in [facility] hospital lab) Nasopharyngeal Nasopharyngeal Swab     Status: Abnormal   Collection Time: 10/13/19  8:39 AM   Specimen: Nasopharyngeal Swab  Result Value Ref Range Status   SARS Coronavirus 2 POSITIVE (A) NEGATIVE Final    Comment: RESULT CALLED TO, READ BACK BY AND VERIFIED WITH: SAUNDARS,L RN @1252  ON 10/13/19 JACKSON,K (NOTE) SARS-CoV-2 target nucleic acids are DETECTED  SARS-CoV-2 RNA is generally detectable in upper respiratory specimens  during the acute phase of infection.  Positive results are indicative  of the presence of the  identified virus, but do not rule out bacterial infection or co-infection with other pathogens not detected by the test.  Clinical correlation with patient history and  other diagnostic information is necessary to determine patient infection status.  The expected result is negative.  Fact Sheet for Patients:   StrictlyIdeas.no   Fact Sheet for Healthcare Providers:   BankingDealers.co.za    This test is not yet approved or cleared by the [place] FDA and  has been authorized for detection and/or diagnosis of SARS-CoV-2 by FDA under an Emergency Use Authorization (EUA).  This EUA will remain in effect (meaning t his test can be used) for the duration of  the COVID-19 declaration under Section 564(b)(1) of the Act, 21 U.S.C. section 360-bbb-3(b)(1), unless the authorization is terminated or revoked sooner.  Performed at [facility], [place] [address], [place], [place] [address]       Radiology Studies: No results found.  Scheduled Meds: . acetaminophen  650 mg Oral TID  . vitamin C  500 mg Oral Daily  . aspirin  81 mg Oral Daily  . brimonidine  1 drop Both Eyes BID   And  . timolol  1 drop Both Eyes BID  . carvedilol  25 mg Oral BID  . cloNIDine  0.3 mg Oral Daily  . donepezil  10 mg Oral QHS  . doxazosin  16 mg Oral QHS  . escitalopram  10 mg Oral Daily  . feeding supplement (ENSURE ENLIVE)  237 mL Oral TID BM  . feeding supplement (PRO-STAT SUGAR FREE 64)  30 mL Oral BID  . ferrous sulfate  325 mg Oral Daily  . folic acid  1 mg Oral Daily  . heparin injection (subcutaneous)  5,000 Units Subcutaneous Q8H  . insulin aspart  0-9 Units Subcutaneous TID WC  . lactose free nutrition  237 mL Oral TID WC  . mirtazapine  15 mg Oral QHS  . polyvinyl alcohol  1 drop Both Eyes BID  . pravastatin  10 mg Oral q1800  . senna-docusate  1 tablet Oral QHS  . traZODone  50 mg Oral QHS  . vitamin B-12  500 mcg Oral  Daily  . zinc sulfate  220 mg Oral Daily   Continuous Infusions: . sodium chloride 100 mL/hr at 10/17/19 1248     LOS: 8 days   Time spent: 25 minutes.  Patrecia Pour, MD [facility] [email or web address] 10/17/2019, 6:47 PM

## 2019-10-17 NOTE — Progress Notes (Signed)
Physical Therapy Treatment Patient Details Name: [name] MRN: [id number] DOB: 24-Aug-1932 Today's Date: 10/17/2019    History of Present Illness 86 yo female admitted with ARF, L shoulder pain 2* effusion/OA. Hx of deafness, dementia, DM, ulcers, syncopal episodes, ETOH abuse, CKD    PT Comments    Pt tolerates supine BLE strengthening exercises at [facility] with verbal/tactile cues and within pt's available range and no over pressure. Attempted to use ASL interpreter but unsuccessful; pt possibly said "a little" pain in her head and continues to "wave at someone above"? Unable to cue pt to come to EOB or mobilize this session. Will continue to progress as able. Patient will benefit from continued physical therapy in hospital and recommendations below to increase strength, balance, endurance for safe ADLs and gait.   Follow Up Recommendations  SNF     Equipment Recommendations  None recommended by PT    Recommendations for Other Services       Precautions / Restrictions Precautions Precautions: Fall Precaution Comments: L shoulder pain (poor functional use) Restrictions Weight Bearing Restrictions: No    Mobility  Bed Mobility     Transfers     Ambulation/Gait      Stairs          Wheelchair Mobility    Modified Rankin (Stroke Patients Only)       Balance                           Cognition Arousal/Alertness: Awake/alert Behavior During Therapy: WFL for tasks assessed/performed Overall Cognitive Status: Difficult to assess    General Comments: using sign language interpreter without success      Exercises General Exercises - Lower Extremity Ankle Circles/Pumps: Supine;AAROM;Both;15 reps (with visual and tactile cues, within pt's range) Short Arc Quad: Supine;AAROM;Both;15 reps (with visual and tactile cues, within pt's range) Hip ABduction/ADduction: Supine;AAROM;Both;15 reps (with visual and tactile cues, within pt's range)     General Comments        Pertinent Vitals/Pain Pain Assessment: Faces Faces Pain Scale: Hurts a little bit Pain Location: head? sign language interpreter difficulty understanding Pain Intervention(s): Limited activity within patient's tolerance;Monitored during session    Home Living                      Prior Function            PT Goals (current goals can now be found in the care plan section) Acute Rehab PT Goals Patient Stated Goal: per son, for pt to live at a different ALF PT Goal Formulation: With family Time For Goal Achievement: 10/23/19 Potential to Achieve Goals: Good Progress towards PT goals: Progressing toward goals    Frequency    Min 2X/week      PT Plan Current plan remains appropriate    Co-evaluation              AM-PAC PT "6 Clicks" Mobility   Outcome Measure  Help needed turning from your back to your side while in a flat bed without using bedrails?: A Lot Help needed moving from lying on your back to sitting on the side of a flat bed without using bedrails?: A Lot Help needed moving to and from a bed to a chair (including a wheelchair)?: A Lot Help needed standing up from a chair using your arms (e.g., wheelchair or bedside chair)?: A Lot Help needed to walk in hospital room?: Total Help needed  climbing 3-5 steps with a railing? : A Lot 6 Click Score: 11    End of Session   Activity Tolerance: Patient tolerated treatment well Patient left: in bed;with call bell/phone within reach;with bed alarm set Nurse Communication: Mobility status PT Visit Diagnosis: Muscle weakness (generalized) (M62.81);Pain;History of falling (Z91.81) Pain - Right/Left: Left Pain - part of body: Shoulder     Time: 1420-1440 PT Time Calculation (min) (ACUTE ONLY): 20 min  Charges:  $Therapeutic Exercise: 8-22 mins                      [name] PT, DPT 10/17/19, 3:06 PM

## 2019-10-18 LAB — CBC WITH DIFFERENTIAL/PLATELET
Abs Immature Granulocytes: 0 10*3/uL (ref 0.00–0.07)
Basophils Absolute: 0 10*3/uL (ref 0.0–0.1)
Basophils Relative: 1 %
Eosinophils Absolute: 0.1 10*3/uL (ref 0.0–0.5)
Eosinophils Relative: 2 %
HCT: 22.8 % — ABNORMAL LOW (ref 36.0–46.0)
Hemoglobin: 7.3 g/dL — ABNORMAL LOW (ref 12.0–15.0)
Immature Granulocytes: 0 %
Lymphocytes Relative: 30 %
Lymphs Abs: 1.1 10*3/uL (ref 0.7–4.0)
MCH: 33 pg (ref 26.0–34.0)
MCHC: 32 g/dL (ref 30.0–36.0)
MCV: 103.2 fL — ABNORMAL HIGH (ref 80.0–100.0)
Monocytes Absolute: 0.5 10*3/uL (ref 0.1–1.0)
Monocytes Relative: 12 %
Neutro Abs: 2 10*3/uL (ref 1.7–7.7)
Neutrophils Relative %: 55 %
Platelets: 261 10*3/uL (ref 150–400)
RBC: 2.21 MIL/uL — ABNORMAL LOW (ref 3.87–5.11)
RDW: 14.3 % (ref 11.5–15.5)
WBC: 3.7 10*3/uL — ABNORMAL LOW (ref 4.0–10.5)
nRBC: 0 % (ref 0.0–0.2)

## 2019-10-18 LAB — COMPREHENSIVE METABOLIC PANEL
ALT: 34 U/L (ref 0–44)
AST: 35 U/L (ref 15–41)
Albumin: 2.9 g/dL — ABNORMAL LOW (ref 3.5–5.0)
Alkaline Phosphatase: 55 U/L (ref 38–126)
Anion gap: 4 — ABNORMAL LOW (ref 5–15)
BUN: 69 mg/dL — ABNORMAL HIGH (ref 8–23)
CO2: 25 mmol/L (ref 22–32)
Calcium: 8.7 mg/dL — ABNORMAL LOW (ref 8.9–10.3)
Chloride: 104 mmol/L (ref 98–111)
Creatinine, Ser: 1.99 mg/dL — ABNORMAL HIGH (ref 0.44–1.00)
GFR calc Af Amer: 26 mL/min — ABNORMAL LOW (ref >60)
GFR calc non Af Amer: 22 mL/min — ABNORMAL LOW (ref >60)
Glucose, Bld: 103 mg/dL — ABNORMAL HIGH (ref 70–99)
Potassium: 5.4 mmol/L — ABNORMAL HIGH (ref 3.5–5.1)
Sodium: 133 mmol/L — ABNORMAL LOW (ref 135–145)
Total Bilirubin: 0.5 mg/dL (ref 0.3–1.2)
Total Protein: 5.5 g/dL — ABNORMAL LOW (ref 6.5–8.1)

## 2019-10-18 LAB — GLUCOSE, CAPILLARY
Glucose-Capillary: 77 mg/dL (ref 70–99)
Glucose-Capillary: 130 mg/dL — ABNORMAL HIGH (ref 70–99)
Glucose-Capillary: 80 mg/dL (ref 70–99)
Glucose-Capillary: 102 mg/dL — ABNORMAL HIGH (ref 70–99)

## 2019-10-18 LAB — D-DIMER, QUANTITATIVE: D-Dimer, Quant: 3.31 ug/mL-FEU — ABNORMAL HIGH (ref 0.00–0.50)

## 2019-10-18 MED ORDER — SODIUM CHLORIDE 0.9 % IV SOLN
510.0000 mg | Freq: Once | INTRAVENOUS | Status: AC
  Administered 2019-10-18: 510 mg via INTRAVENOUS
  Filled 2019-10-18: qty 510

## 2019-10-18 MED ORDER — CLONIDINE HCL 0.2 MG PO TABS
0.2000 mg | ORAL_TABLET | Freq: Two times a day (BID) | ORAL | Status: DC
  Administered 2019-10-18 – 2019-10-21 (×7): 0.2 mg via ORAL
  Filled 2019-10-18 (×7): qty 1

## 2019-10-18 MED ORDER — CALCIUM GLUCONATE-NACL 1-0.675 GM/50ML-% IV SOLN
1.0000 g | Freq: Once | INTRAVENOUS | Status: AC
  Administered 2019-10-18: 1000 mg via INTRAVENOUS
  Filled 2019-10-18: qty 50

## 2019-10-18 MED ORDER — AMLODIPINE BESYLATE 5 MG PO TABS
5.0000 mg | ORAL_TABLET | Freq: Every day | ORAL | Status: DC
  Administered 2019-10-18: 5 mg via ORAL
  Filled 2019-10-18: qty 1

## 2019-10-18 MED ORDER — DARBEPOETIN ALFA 150 MCG/0.3ML IJ SOSY
150.0000 ug | PREFILLED_SYRINGE | Freq: Once | INTRAMUSCULAR | Status: AC
  Administered 2019-10-18: 150 ug via SUBCUTANEOUS
  Filled 2019-10-18: qty 0.3

## 2019-10-18 MED ORDER — CLONIDINE HCL 0.3 MG PO TABS: 0.3000 mg | ORAL_TABLET | Freq: Two times a day (BID) | ORAL | Status: DC

## 2019-10-18 MED ORDER — SODIUM ZIRCONIUM CYCLOSILICATE 10 G PO PACK
10.0000 g | PACK | Freq: Every day | ORAL | Status: DC
  Administered 2019-10-18 – 2019-10-21 (×4): 10 g via ORAL
  Filled 2019-10-18 (×4): qty 1

## 2019-10-18 NOTE — Progress Notes (Addendum)
PROGRESS NOTE  [name]  TZG:[id number] DOB: 04-21-1933 DOA: 10/07/2019 PCP: Lynnell Catalan, FNP   Brief Narrative: [name] is an 86 y.o. female with a history of dementia, deafness, T2DM, stage IIIb CKD, and HTN who presented to the ED 6/14 from [facility] with left shoulder pain found to have effusion and AKI.  Assessment & Plan: Principal Problem:   ARF (acute renal failure) (HCC) Active Problems:   Hyperkalemia   Swelling of joint of left shoulder   Hypertensive urgency   DM type 2 (diabetes mellitus, type 2) (HCC)   Dementia (HCC)   Acute renal failure (ARF) (HCC)   Pressure injury of skin   AKI (acute kidney injury) ([place])  AKI on stage IIIb CKD: Baseline SCr presumed to have been 1.2 based on remote value. SCr 1.8 on admission and only transiently improved from that. renal U/S showed echogenic kidneys suggesting advanced chronic medical renal disease, without acute finding. Suspect that CKD has advanced to stage IV with persistent hypertension. - Monitor BMP in AM, avoid nephrotoxins - Would benefit from nephrology follow up.  - Managing AOCD as below.  Left shoulder osteoarthritis with effusion s/p 30cc drainage by IR on 6/17. Studies not concerning for septic arthritis. Culture negative, gram stain negative. - Continue PT/OT, ROM. Recommend orthopedic surgery follow up after discharge. - Continue pain control. - Continue LUE elevation to treat edema with medative U/S for DVT or SVT.   SARS-CoV-2 positivity: Ct value is 39 consistent with very small concentration of nucleic acid target on PCR. CRP not elevated. No respiratory symptoms. Suspect asymptomatic infection vs. noninfectious viral shedding. No PNA on CXR. - Continue isolation for 10 days from date of positive test (was 6/19) given the patient does not have severe symptoms. - With the patient suffering from both dementia and deafness, she has no effective means of communication without in-person ASL  interpretation. With full informed consent regarding risks of transmission, her son may be allowed visitation with strict precautions to otherwise remain quarantined. - No indication for antiviral Tx or steroids at this time.  - Added heparin [place] in the absence of gross bleeding.  NIDT2DM: Improved, HbA1c 5.8%. - Continue SSI, will discontinue metformin with CKD.   HTN with HTN urgency: Severe range BP has returned. - Continue coreg, clonidine. Persistently elevated, will augment clonidine, add norvasc.  HLD:  - Continue statin  Dementia and depression: At very high risk of delirium with visitation restrictions and language barrier (deafness with inattention limiting use of video remote interpretors). - Continue aricept, SSRI, trazodone, remeron  Glaucoma:  - Continue gtt's  Iron deficiency anemia and anemia of CKD: Iron, folate, B12 levels wnl.  - Discussed with nephrology, Dr. Moshe Cipro. Based on phone conversation only, she agreed with use of feraheme x1 and darbepoetin. Risks and benefits were discussed with son at length, we will proceed. No BM yet, so doubt significant GI bleeding. FOBT to be checked if possible. Given elevated d-dimer, aggressive goals of care, hospitalization/immobility, and active covid, will continue DVT ppx.  - Continue iron supplementation po as well. - Recheck in AM.   Hyponatremia: Chronic, improved.  - Monitor on isotonic saline.  Hyperkalemia: Resolved with treatment  Left buttock pressure ulcer stage I POA. Continue foam dressing. Pressure Injury 10/08/19 Buttocks Right;Left Stage 1 -  Intact skin with non-blanchable redness of a localized area usually over a bony prominence. (Active)  10/08/19 1300  Location: Buttocks  Location Orientation: Right;Left  Staging: Stage 1 -  Intact skin with non-blanchable redness of a localized area usually over a bony prominence.  Wound Description (Comments):   Present on Admission: Yes   DVT prophylaxis:  Heparin [place] Code Status: Full code (was previously on hospice which has been revoked) Family Communication: None at bedside, spoke with son by phone this morning Disposition Plan:  Status is: Inpatient  Remains inpatient appropriate because:Inpatient level of care appropriate due to severity of illness   Dispo: The patient is from: SNF              Anticipated d/c is to: SNF              Anticipated d/c date is: 6/26 if CBC stable.              Patient currently is not medically stable to d/c.  Consultants:   IR  Procedures:   Left shoulder fluoroscopic aspiration  Antimicrobials:  None   Subjective: Communication limited by deafness and inability to train attention on ASL video interpretor. She denies pain. No overnight events noted.  Objective: Vitals:   10/17/19 2152 10/18/19 0655 10/18/19 0735 10/18/19 1340  BP: (!) 154/83 (!) 184/101 (!) 175/107 (!) 147/92  Pulse: 86 66 67 70  Resp: 17 17  18   Temp: 97.7 F (36.5 C) 98.1 F (36.7 C)  97.6 F (36.4 C)  TempSrc:    Oral  SpO2: 96% 99%  99%  Weight:      Height:        Intake/Output Summary (Last 24 hours) at 10/18/2019 1806 Last data filed at 10/18/2019 1740 Gross per 24 hour  Intake 407 ml  Output 2400 ml  Net -1993 ml   Filed Weights   10/14/19 0410 10/15/19 0514 10/17/19 0500  Weight: 60.9 kg 60.7 kg 64.4 kg   Gen: Deaf, confused elderly female in no distress Pulm: Nonlabored breathing room air. Clear. CV: Regular rate and rhythm. No murmur, rub, or gallop. No JVD, trace dependent edema. GI: Abdomen soft, non-tender, non-distended, with normoactive bowel sounds.  Ext: Warm, no deformities Skin: No rashes, lesions or ulcers on visualized skin. Neuro: Alert and disoriented, responds to simple written questions appropriately. Tardive dyskinesia is stable. No focal neurological deficits on limited exam. Psych: Judgement and insight appear impaired. Mood euthymic & affect congruent. Behavior is  appropriate.    Data Reviewed: I have personally reviewed following labs and imaging studies  CBC: Recent Labs  Lab 10/14/19 0436 10/15/19 0430 10/16/19 0507 10/17/19 0448 10/18/19 0419  WBC 3.1* 4.5 4.5 3.6* 3.7*  NEUTROABS 1.5* 2.5 2.6 1.7 2.0  HGB 8.0* 8.0* 7.6* 7.2* 7.3*  HCT 24.2* 24.4* 23.1* 22.1* 22.8*  MCV 99.2 100.8* 100.4* 102.8* 103.2*  PLT 231 274 237 222 709   Basic Metabolic Panel: Recent Labs  Lab 10/12/19 0447 10/12/19 0447 10/13/19 0351 10/13/19 1434 10/14/19 2227 10/15/19 0430 10/16/19 0507 10/17/19 0448 10/18/19 0419  NA 132*   < > 130*   < > 131* 130* 131* 133* 133*  K 4.3   < > 5.2*   < > 5.1 5.0 4.9 5.0 5.4*  CL 104   < > 99   < > 97* 98 101 105 104  CO2 22   < > 21*   < > 24 24 24 24 25   GLUCOSE 88   < > 94   < > 164* 106* 103* 92 103*  BUN 44*   < > 44*   < > 59* 58* 64* 59*  69*  CREATININE 1.60*   < > 1.73*   < > 2.13* 2.02* 1.87* 1.76* 1.99*  CALCIUM 8.0*   < > 8.8*   < > 8.9 8.6* 8.9 8.9 8.7*  PHOS 3.0  --  3.2  --   --   --   --   --   --    < > = values in this interval not displayed.   GFR: Estimated Creatinine Clearance: 18.4 mL/min (A) (by C-G formula based on SCr of 1.99 mg/dL (H)). Liver Function Tests: Recent Labs  Lab 10/14/19 0436 10/15/19 0430 10/16/19 0507 10/17/19 0448 10/18/19 0419  AST 18 25 18 18  35  ALT 17 24 22 20  34  ALKPHOS 48 54 52 50 55  BILITOT 0.4 0.5 0.2* 0.5 0.5  PROT 5.5* 5.6* 5.9* 5.5* 5.5*  ALBUMIN 2.9* 3.0* 3.2* 2.9* 2.9*   No results for input(s): LIPASE, AMYLASE in the last 168 hours. No results for input(s): AMMONIA in the last 168 hours. Coagulation Profile: No results for input(s): INR, PROTIME in the last 168 hours. Cardiac Enzymes: No results for input(s): CKTOTAL, CKMB, CKMBINDEX, TROPONINI in the last 168 hours. BNP (last 3 results) No results for input(s): PROBNP in the last 8760 hours. HbA1C: Recent Labs    10/17/19 0448  HGBA1C 5.8*   CBG: Recent Labs  Lab 10/17/19 1630  10/17/19 2149 10/18/19 0805 10/18/19 1142 10/18/19 1737  GLUCAP 109* 127* 77 130* 80   Lipid Profile: No results for input(s): CHOL, HDL, LDLCALC, TRIG, CHOLHDL, LDLDIRECT in the last 72 hours. Thyroid Function Tests: No results for input(s): TSH, T4TOTAL, FREET4, T3FREE, THYROIDAB in the last 72 hours. Anemia Panel: Recent Labs    10/16/19 0507 10/17/19 0448  VITAMINB12  --  470  FOLATE  --  6.7  FERRITIN 59 55  TIBC  --  252  IRON  --  40  RETICCTPCT  --  1.9   Urine analysis:    Component Value Date/Time   COLORURINE YELLOW 08/22/2017 1219   APPEARANCEUR HAZY (A) 08/22/2017 1219   LABSPEC 1.023 08/22/2017 1219   PHURINE 6.0 08/22/2017 1219   GLUCOSEU NEGATIVE 08/22/2017 1219   HGBUR NEGATIVE 08/22/2017 1219   BILIRUBINUR NEGATIVE 08/22/2017 1219   KETONESUR NEGATIVE 08/22/2017 1219   PROTEINUR NEGATIVE 08/22/2017 1219   UROBILINOGEN 0.2 12/22/2010 1421   NITRITE NEGATIVE 08/22/2017 1219   LEUKOCYTESUR SMALL (A) 08/22/2017 1219   Recent Results (from the past 240 hour(s))  Body fluid culture     Status: None   Collection Time: 10/10/19  9:00 AM   Specimen: Joint, Left Shoulder; Synovial Fluid  Result Value Ref Range Status   Specimen Description   Final    FLUID SYNOVIAL LEFT SHOULDER Performed at [facility], [place] [address], [place], [place] [address]    Special Requests   Final    NONE Performed at [facility], [place] [address] [place], [place] [address]    Gram Stain   Final    MODERATE WBC PRESENT,BOTH PMN AND MONONUCLEAR NO ORGANISMS SEEN Gram Stain Report Called to,Read Back By and Verified With: BULLINS,H @ 8786 ON [id number] BY POTEAT,S Performed at [place] [address] [place], [place] [address]    Culture   Final    NO GROWTH 3 DAYS Performed at [facility], [place] [address] [place], [place] [address]    Report Status 10/13/2019 FINAL  Final  SARS CORONAVIRUS 2 (TAT 6-24  HRS) Nasopharyngeal Nasopharyngeal Swab     Status: Abnormal   Collection Time: 10/12/19  7:33 PM   Specimen: Nasopharyngeal Swab  Result Value Ref Range Status   SARS Coronavirus 2 POSITIVE (A) NEGATIVE Final    Comment: RESULT CALLED TO, READ BACK BY AND VERIFIED WITH: Aquilla Hacker [id number] 10/13/2019 T. TYSOR (NOTE) SARS-CoV-2 target nucleic acids are DETECTED.  The SARS-CoV-2 RNA is generally detectable in upper and lower respiratory specimens during the acute phase of infection. Positive results are indicative of the presence of SARS-CoV-2 RNA. Clinical correlation with patient history and other diagnostic information is  necessary to determine patient infection status. Positive results do not rule out bacterial infection or co-infection with other viruses.  The expected result is Negative.  Fact Sheet for Patients: SugarRoll.be  Fact Sheet for Healthcare Providers: https://www.woods-mathews.com/  This test is not yet approved or cleared by the [place] FDA and  has been authorized for detection and/or diagnosis of SARS-CoV-2 by FDA under an Emergency Use Authorization (EUA). This EUA will remain  in effect (meaning this test can be used ) for the duration of the COVID-19 declaration under Section 564(b)(1) of the Act, 21 U.S.C. section 360bbb-3(b)(1), unless the authorization is terminated or revoked sooner.   Performed at [facility], [place] [address] [place], [place] [address]   SARS Coronavirus 2 by RT PCR (hospital order, performed in [facility] hospital lab) Nasopharyngeal Nasopharyngeal Swab     Status: Abnormal   Collection Time: 10/13/19  8:39 AM   Specimen: Nasopharyngeal Swab  Result Value Ref Range Status   SARS Coronavirus 2 POSITIVE (A) NEGATIVE Final    Comment: RESULT CALLED TO, READ BACK BY AND VERIFIED WITH: SAUNDARS,L RN @1252  ON 10/13/19 JACKSON,K (NOTE) SARS-CoV-2 target nucleic acids are  DETECTED  SARS-CoV-2 RNA is generally detectable in upper respiratory specimens  during the acute phase of infection.  Positive results are indicative  of the presence of the identified virus, but do not rule out bacterial infection or co-infection with other pathogens not detected by the test.  Clinical correlation with patient history and  other diagnostic information is necessary to determine patient infection status.  The expected result is negative.  Fact Sheet for Patients:   StrictlyIdeas.no   Fact Sheet for Healthcare Providers:   BankingDealers.co.za    This test is not yet approved or cleared by the [place] FDA and  has been authorized for detection and/or diagnosis of SARS-CoV-2 by FDA under an Emergency Use Authorization (EUA).  This EUA will remain in effect (meaning t his test can be used) for the duration of  the COVID-19 declaration under Section 564(b)(1) of the Act, 21 U.S.C. section 360-bbb-3(b)(1), unless the authorization is terminated or revoked sooner.  Performed at [facility], [place] [address], [place],  [address]       Radiology Studies: No results found.  Scheduled Meds: . acetaminophen  650 mg Oral TID  . amLODipine  5 mg Oral Daily  . vitamin C  500 mg Oral Daily  . aspirin  81 mg Oral Daily  . brimonidine  1 drop Both Eyes BID   And  . timolol  1 drop Both Eyes BID  . carvedilol  25 mg Oral BID  . cloNIDine  0.2 mg Oral BID  . donepezil  10 mg Oral QHS  . doxazosin  16 mg Oral QHS  . escitalopram  10 mg Oral Daily  . feeding supplement (ENSURE ENLIVE)  237 mL Oral TID BM  . feeding supplement (PRO-STAT SUGAR FREE 64)  30 mL Oral BID  . ferrous sulfate  325 mg Oral Daily  . folic acid  1 mg Oral Daily  . heparin injection (subcutaneous)  5,000 Units Subcutaneous Q8H  . insulin aspart  0-9 Units Subcutaneous TID WC  . lactose free nutrition  237 mL Oral TID WC   . mirtazapine  15 mg Oral QHS  . polyvinyl alcohol  1 drop Both Eyes BID  . pravastatin  10 mg Oral q1800  . senna-docusate  1 tablet Oral QHS  . sodium zirconium cyclosilicate  10 g Oral Daily  . traZODone  50 mg Oral QHS  . vitamin B-12  500 mcg Oral Daily  . zinc sulfate  220 mg Oral Daily   Continuous Infusions:    LOS: 9 days   Time spent: 35 minutes.  Patrecia Pour, MD [facility] [email or web address] 10/18/2019, 6:06 PM

## 2019-10-18 NOTE — TOC Progression Note (Signed)
Transition of Care [facility]) - Progression Note    Patient Details  Name: [name] MRN: [id number] Date of Birth: January 22, 1933  [facility] CM/SW Contact  Purcell Mouton, RN Phone Number: 10/18/2019, 3:28 PM  Clinical Narrative:    Plan for pt to discharge to [facility] SNF/if they can take COVID pt. Coordinator is checking to see if pt may return.    Expected Discharge Plan: [place] Barriers to Discharge: Continued Medical Work up  Expected Discharge Plan and Services Expected Discharge Plan: [place] In-house Referral: Clinical Social Work   Post Acute Care Choice: [place] Living arrangements for the past 2 months: [place] ([facility])                   DME Agency: NA       HH Arranged: NA           Social Determinants of Health (SDOH) Interventions    Readmission Risk Interventions No flowsheet data found.

## 2019-10-19 LAB — GLUCOSE, CAPILLARY
Glucose-Capillary: 127 mg/dL — ABNORMAL HIGH (ref 70–99)
Glucose-Capillary: 188 mg/dL — ABNORMAL HIGH (ref 70–99)
Glucose-Capillary: 135 mg/dL — ABNORMAL HIGH (ref 70–99)
Glucose-Capillary: 75 mg/dL (ref 70–99)

## 2019-10-19 LAB — BASIC METABOLIC PANEL
Anion gap: 7 (ref 5–15)
BUN: 75 mg/dL — ABNORMAL HIGH (ref 8–23)
CO2: 25 mmol/L (ref 22–32)
Calcium: 9.2 mg/dL (ref 8.9–10.3)
Chloride: 99 mmol/L (ref 98–111)
Creatinine, Ser: 2 mg/dL — ABNORMAL HIGH (ref 0.44–1.00)
GFR calc Af Amer: 26 mL/min — ABNORMAL LOW (ref >60)
GFR calc non Af Amer: 22 mL/min — ABNORMAL LOW (ref >60)
Glucose, Bld: 104 mg/dL — ABNORMAL HIGH (ref 70–99)
Potassium: 4.9 mmol/L (ref 3.5–5.1)
Sodium: 131 mmol/L — ABNORMAL LOW (ref 135–145)

## 2019-10-19 LAB — CBC
HCT: 25.4 % — ABNORMAL LOW (ref 36.0–46.0)
Hemoglobin: 8.3 g/dL — ABNORMAL LOW (ref 12.0–15.0)
MCH: 33.3 pg (ref 26.0–34.0)
MCHC: 32.7 g/dL (ref 30.0–36.0)
MCV: 102 fL — ABNORMAL HIGH (ref 80.0–100.0)
Platelets: 315 10*3/uL (ref 150–400)
RBC: 2.49 MIL/uL — ABNORMAL LOW (ref 3.87–5.11)
RDW: 14.4 % (ref 11.5–15.5)
WBC: 3.6 10*3/uL — ABNORMAL LOW (ref 4.0–10.5)
nRBC: 0 % (ref 0.0–0.2)

## 2019-10-19 MED ORDER — AMLODIPINE BESYLATE 10 MG PO TABS
10.0000 mg | ORAL_TABLET | Freq: Every day | ORAL | Status: DC
  Administered 2019-10-19 – 2019-10-21 (×3): 10 mg via ORAL
  Filled 2019-10-19 (×3): qty 1

## 2019-10-19 MED ORDER — CLONIDINE HCL 0.3 MG PO TABS: 0.3000 mg | ORAL_TABLET | Freq: Two times a day (BID) | ORAL | Status: DC

## 2019-10-19 NOTE — Progress Notes (Signed)
   10/18/19 2047  Assess: MEWS Score  Temp 98 F (36.7 C)  BP (!) 214/104  Pulse Rate 72  Resp 16  SpO2 97 %  Assess: MEWS Score  MEWS Temp 0  MEWS Systolic 2  MEWS Pulse 0  MEWS RR 0  MEWS LOC 0  MEWS Score 2  MEWS Score Color Yellow  Assess: if the MEWS score is Yellow or Red  Were vital signs taken at a resting state? No  Focused Assessment Documented focused assessment  MEWS guidelines implemented *See Row Information* No, vital signs rechecked

## 2019-10-19 NOTE — Progress Notes (Signed)
Spoke with son regarding SNF placement issues. Advised info has been sent out to other facilities and waiting on offers. Will follow up with SW tomorrow. Eulas Post, RN

## 2019-10-19 NOTE — Discharge Summary (Signed)
Physician Discharge Summary  [name] DJT:[id number] DOB: 1933/03/19 DOA: 10/07/2019  PCP: Lynnell Catalan, FNP  Admit date: 10/07/2019 Discharge date: 10/19/2019  Admitted From: SNF Disposition: SNF   Recommendations for Outpatient Follow-up:   - Follow up with PCP in 1-2 weeks  - Follow up with orthopedics in 2-4 weeks for continued management of left shoulder arthritis. - Increase clonidine and monitor BP - Stop metformin with CrCl <87ml/min - Follow up with nephrology recommended - Continue oral iron supplementation. Received IV iron and EPO 6/25. - Monitor CBC, renal function panel within the next week - Requires 10 days of isolation from positive testing for asymptomatic covid-19 infection diagnosed on 6/19. - Continue goals of care discussions and palliative care involvement  Home Health: None Equipment/Devices: Per SNF Discharge Condition: Stable CODE STATUS: Full (confirmed with son during this admission) Diet recommendation: Heart healthy  Brief/Interim Summary: [name] is an 86 y.o. female with a history of dementia, deafness, T2DM, stage IIIb CKD, and HTN who presented to the ED 6/14 from [facility] with left shoulder pain found to have effusion and AKI. Left shoulder effusion was tapped, not consistent with infection, and treated supportively. SARS-CoV-2 testing, per screening admission protocol, was positive on 6/19, though the patient was asymptomatic without infiltrates on CXR, hypoxemia, or elevation in CRP. Isolation was continued through admission and will need to continue for 10 total days. IV fluids were given with limited improvement in creatinine, and the patient's per oral intake has improved. Renal ultrasound suggests the patient's elevated creatinine represents progressive CKD from uncontrolled HTN. Modifications to antihypertensive regimen were made with some improvement. Iron and EPO have been given for anemia of chronic disease with iron  deficiency, though no active bleeding has been noted. She is stable for discharge back to SNF.  Discharge Diagnoses:  Principal Problem:   ARF (acute renal failure) (HCC) Active Problems:   Hyperkalemia   Swelling of joint of left shoulder   Hypertensive urgency   DM type 2 (diabetes mellitus, type 2) (HCC)   Dementia (HCC)   Acute renal failure (ARF) (HCC)   Pressure injury of skin   AKI (acute kidney injury) ([place])  AKI on stage IIIb CKD initial diagnosis, now actually suspect progressive CKD, now stage IV CKD: Baseline SCr presumed to have been 1.2 based on remote value. SCr 1.8 on admission and only transiently improved from that. Renal U/S showed echogenic kidneys suggesting advanced chronic medical renal disease, without acute finding.  - Manage HTN - Monitor BMP regularly, renally dose medications, avoid nephrotoxins - Would benefit from nephrology follow up.  - Managing AOCD as below.  Left shoulder osteoarthritis with effusion s/p 30cc drainage by IR on 6/17. Studies not concerning for septic arthritis. Culture negative, gram stain negative. - Continue PT/OT, ROM. Recommend orthopedic surgery follow up after discharge. - Continue pain control. - Continue LUE elevation to treat edema with negative U/S for DVT or SVT.   SARS-CoV-2 positivity: Ct value is 39 consistent with very small concentration of nucleic acid target on PCR. CRP not elevated. No respiratory symptoms. Suspect asymptomatic infection vs. noninfectious viral shedding. No PNA on CXR. - Continue isolation for 10 days from date of positive test (was 6/19) given the patient does not have severe symptoms. - No indication for antiviral Tx or steroids at this time.   NIDT2DM: Improved, HbA1c 5.8%. - Continue SSI, will discontinue metformin with CKD.   HTN with HTN urgency: Severe range BP has returned. -  Continue coreg, clonidine. Persistently elevated, will augment clonidine. Added norvasc as inpatient, can  consider this if increased clonidine is ineffective.  HLD:  - Continue statin  Dementia and depression: At very high risk of delirium with visitation restrictions and language barrier (deafness with inattention limiting use of video remote interpretors). - Continue aricept, SSRI, trazodone, remeron  Glaucoma:  - Continue gtt's  Iron deficiency anemia and anemia of CKD: Folate, B12 levels wnl.  - Discussed with nephrology, Dr. Moshe Cipro, recommended feraheme x1 and darbepoetin to which the patient's son consented on 6/25. No gross bleeding noted.  - Continue iron supplementation po as well. - Recheck on day of discharge shows hgb improved to 8.3g/dl. Recommend continued follow up.  Hyponatremia: Chronic, improved.   Hyperkalemia: Resolved with lokelma x1. Needs to be monitored. Avoid ACE/ARB/spiro  Left buttock pressure ulcer stage I POA. Continue foam dressing. Pressure Injury 10/08/19 Buttocks Right;Left Stage 1 - Intact skin with non-blanchable redness of a localized area usually over a bony prominence. (Active)  10/08/19 1300  Location: Buttocks  Location Orientation: Right;Left  Staging: Stage 1 - Intact skin with non-blanchable redness of a localized area usually over a bony prominence.  Wound Description (Comments):   Present on Admission: Yes   Discharge Instructions Discharge Instructions    Diet - low sodium heart healthy   Complete by: As directed    Discharge instructions   Complete by: As directed    - Increase clonidine and monitor BP - Stop metformin with CrCl <20ml/min - Follow up with nephrology recommended - Continue oral iron supplementation. Received IV iron and EPO 6/25. - Monitor CBC, renal function panel within the next week - Requires 10 days of isolation from positive testing for asymptomatic covid-19 infection diagnosed on 6/19. - Continue goals of care discussions and palliative care involvement   Discharge wound care:   Complete by: As  directed    Wound care to ulcerations on bilateral hammer-toes deformities:  Cleanse with NS, pat dry.  Apply a betadine swabstick to both lesions, allow to air-dry before replacing sock.  No dressing.     Allergies as of 10/19/2019   No Known Allergies     Medication List    STOP taking these medications   metFORMIN 1000 MG tablet Commonly known as: GLUCOPHAGE     TAKE these medications   Artificial Tears 0.2-0.2-1 % Soln Generic drug: Glycerin-Hypromellose-PEG 400 Place 1 drop into both eyes 2 (two) times daily. (1200 and 1900)   aspirin 81 MG chewable tablet Chew 81 mg by mouth daily. (0700)   carvedilol 25 MG tablet Commonly known as: COREG Take 25 mg by mouth 2 (two) times daily. (0700 & 1900)   cloNIDine 0.3 MG tablet Commonly known as: CATAPRES Take 1 tablet (0.3 mg total) by mouth 2 (two) times daily. (0700) What changed: when to take this   Combigan 0.2-0.5 % ophthalmic solution Generic drug: brimonidine-timolol Place 1 drop into both eyes 2 (two) times daily. (Separate from other eye drops by at least 10 minutes)   docusate sodium 100 MG capsule Commonly known as: COLACE Take 100 mg by mouth daily. (0700)   donepezil 10 MG tablet Commonly known as: ARICEPT Take 10 mg by mouth at bedtime. (1900)   doxazosin 8 MG tablet Commonly known as: CARDURA Take 16 mg by mouth at bedtime. (1900)   escitalopram 10 MG tablet Commonly known as: LEXAPRO Take 10 mg by mouth daily. (0700)   ferrous sulfate 325 (65 FE) MG  tablet Take 325 mg by mouth daily. (0700)   Ilevro 0.3 % ophthalmic suspension Generic drug: nepafenac Place 1 drop into the left eye daily.   INSTA-GLUCOSE PO Take 1 Dose by mouth daily as needed (for blood sugar less than 70--recheck in 15 minutes.).   lovastatin 10 MG tablet Commonly known as: MEVACOR Take 10 mg by mouth at bedtime.   mirtazapine 15 MG tablet Commonly known as: REMERON Take 15 mg by mouth at bedtime.   NUTRITIONAL DRINK  PO Take 1 each by mouth in the morning and at bedtime.   traZODone 50 MG tablet Commonly known as: DESYREL Take 50 mg by mouth at bedtime. (1900)            Discharge Care Instructions  (From admission, onward)         Start     Ordered   10/19/19 0000  Discharge wound care:       Comments: Wound care to ulcerations on bilateral hammer-toes deformities:  Cleanse with NS, pat dry.  Apply a betadine swabstick to both lesions, allow to air-dry before replacing sock.  No dressing.   10/19/19 0350          Follow-up Information    Polite, Amador Cunas, FNP. Schedule an appointment as soon as possible for a visit.   Specialty: Family Medicine Contact information: [address] [place] [address] [phone]              No Known Allergies  Consultations:  Nephrology by phone  Procedures/Studies: DG Chest 1 View  Result Date: 10/07/2019 CLINICAL DATA:  Fall. EXAM: CHEST  1 VIEW COMPARISON:  March 18, 2018. FINDINGS: Stable cardiomegaly. No pneumothorax or pleural effusion is noted. Both lungs are clear. The visualized skeletal structures are unremarkable. IMPRESSION: No active disease. Electronically Signed   By: Marijo Conception M.D.   On: 10/07/2019 18:26   DG Pelvis 1-2 Views  Result Date: 10/07/2019 CLINICAL DATA:  Fall today. EXAM: PELVIS - 1-2 VIEW COMPARISON:  January 27, 2007. FINDINGS: There is no evidence of pelvic fracture or diastasis. No pelvic bone lesions are seen. IMPRESSION: Negative. Electronically Signed   By: Marijo Conception M.D.   On: 10/07/2019 18:30   DG Shoulder Right  Result Date: 10/07/2019 CLINICAL DATA:  Bilateral shoulder pain after fall today. EXAM: RIGHT SHOULDER - 2+ VIEW COMPARISON:  February 21, 2011. FINDINGS: There is no evidence of fracture or dislocation. Severe degenerative changes seen involving the right glenohumeral joint. Soft tissues are unremarkable. IMPRESSION: Severe degenerative joint disease of the right  glenohumeral joint. No acute abnormality seen in the right shoulder. Electronically Signed   By: Marijo Conception M.D.   On: 10/07/2019 18:28   CT Head Wo Contrast  Result Date: 10/07/2019 CLINICAL DATA:  Unwitnessed fall. Complains of left shoulder injury. EXAM: CT HEAD WITHOUT CONTRAST CT CERVICAL SPINE WITHOUT CONTRAST TECHNIQUE: Multidetector CT imaging of the head and cervical spine was performed following the standard protocol without intravenous contrast. Multiplanar CT image reconstructions of the cervical spine were also generated. COMPARISON:  10/25/2017 FINDINGS: CT HEAD FINDINGS Brain: No evidence of acute infarction, hemorrhage, hydrocephalus, extra-axial collection or mass lesion/mass effect. There is mild diffuse low-attenuation within the subcortical and periventricular white matter compatible with chronic microvascular disease. Chronic left basal ganglia lacunar infarct noted. Vascular: No hyperdense vessel or unexpected calcification. Skull: Normal. Negative for fracture or focal lesion. Sinuses/Orbits: No acute finding. Other: None. CT CERVICAL SPINE FINDINGS Alignment: Normal. Skull  base and vertebrae: No acute fracture. No primary bone lesion or focal pathologic process. Soft tissues and spinal canal: No prevertebral fluid or swelling. No visible canal hematoma. Disc levels: Marked multi level disc space narrowing and endplate spurring is identified. Upper chest: Negative. Other: None IMPRESSION: 1. No acute intracranial abnormalities. Chronic small vessel ischemic change and brain atrophy. 2. No evidence for cervical spine fracture. 3. Advanced cervical degenerative disc disease. Electronically Signed   By: Kerby Moors M.D.   On: 10/07/2019 18:40   CT Cervical Spine Wo Contrast  Result Date: 10/07/2019 CLINICAL DATA:  Unwitnessed fall. Complains of left shoulder injury. EXAM: CT HEAD WITHOUT CONTRAST CT CERVICAL SPINE WITHOUT CONTRAST TECHNIQUE: Multidetector CT imaging of the head  and cervical spine was performed following the standard protocol without intravenous contrast. Multiplanar CT image reconstructions of the cervical spine were also generated. COMPARISON:  10/25/2017 FINDINGS: CT HEAD FINDINGS Brain: No evidence of acute infarction, hemorrhage, hydrocephalus, extra-axial collection or mass lesion/mass effect. There is mild diffuse low-attenuation within the subcortical and periventricular white matter compatible with chronic microvascular disease. Chronic left basal ganglia lacunar infarct noted. Vascular: No hyperdense vessel or unexpected calcification. Skull: Normal. Negative for fracture or focal lesion. Sinuses/Orbits: No acute finding. Other: None. CT CERVICAL SPINE FINDINGS Alignment: Normal. Skull base and vertebrae: No acute fracture. No primary bone lesion or focal pathologic process. Soft tissues and spinal canal: No prevertebral fluid or swelling. No visible canal hematoma. Disc levels: Marked multi level disc space narrowing and endplate spurring is identified. Upper chest: Negative. Other: None IMPRESSION: 1. No acute intracranial abnormalities. Chronic small vessel ischemic change and brain atrophy. 2. No evidence for cervical spine fracture. 3. Advanced cervical degenerative disc disease. Electronically Signed   By: Kerby Moors M.D.   On: 10/07/2019 18:40   CT HUMERUS LEFT WO CONTRAST  Result Date: 10/07/2019 CLINICAL DATA:  Left shoulder and upper arm pain after injury. Unwitnessed fall today. EXAM: CT OF THE UPPER LEFT EXTREMITY WITHOUT CONTRAST CT OF THE LEFT SHOULDER WITHOUT CONTRAST. TECHNIQUE: Multidetector CT imaging of the shoulder and upper left extremity was performed according to the standard protocol. COMPARISON:  Radiographs earlier today FINDINGS: Shoulder: Bones/Joint/Cartilage No acute fracture. Advanced glenohumeral osteoarthritis with joint space narrowing, spurring, subchondral cystic change. There is some bony fragmentation about the  inferior aspect of the glenohumeral joint extending into the axillary recess. Subcortical irregularity cystic changes circumferentially involving the humeral head including the non articular surface. Heterogeneous joint effusion extending into the axillary recess, incompletely assessed on this noncontrast exam. Chronic periosteal thickening versus bony fragmentation involving the subacromial scapula. Ligaments Suboptimally assessed by CT. Muscles and Tendons There is fatty atrophy of the rotator cuff musculature. Soft tissues Complex joint effusion. Soft tissues are suboptimally assessed due to soft tissue atrophy. Query cachexia. Chronic small left pleural calcification in the lingula. No acute fracture of the left ribs. Humerus: Bones No acute fracture. Diffuse under mineralization. No evidence of focal lesion. Elbow alignment is maintained. Muscles and tendons No obvious intramuscular hematoma. Soft tissues Joint effusion better assessed on concurrent shoulder exam. Scattered soft tissue calcifications posteriorly that may be vascular. No obvious soft tissue hematoma. Skin laxity versus skin tear distally about the lateral aspect. IMPRESSION: 1. No acute fracture of the left shoulder or humerus. 2. Advanced glenohumeral osteoarthritis. 3. Large complex joint effusion. Subcortical irregularity involving the circumferential humeral head is likely related to chronic joint inflammation. This can be further assessed with MRI based on clinical concern, if  patient is able to tolerate. Electronically Signed   By: Keith Rake M.D.   On: 10/07/2019 21:13   CT SHOULDER LEFT WO CONTRAST  Result Date: 10/07/2019 CLINICAL DATA:  Left shoulder and upper arm pain after injury. Unwitnessed fall today. EXAM: CT OF THE UPPER LEFT EXTREMITY WITHOUT CONTRAST CT OF THE LEFT SHOULDER WITHOUT CONTRAST. TECHNIQUE: Multidetector CT imaging of the shoulder and upper left extremity was performed according to the standard protocol.  COMPARISON:  Radiographs earlier today FINDINGS: Shoulder: Bones/Joint/Cartilage No acute fracture. Advanced glenohumeral osteoarthritis with joint space narrowing, spurring, subchondral cystic change. There is some bony fragmentation about the inferior aspect of the glenohumeral joint extending into the axillary recess. Subcortical irregularity cystic changes circumferentially involving the humeral head including the non articular surface. Heterogeneous joint effusion extending into the axillary recess, incompletely assessed on this noncontrast exam. Chronic periosteal thickening versus bony fragmentation involving the subacromial scapula. Ligaments Suboptimally assessed by CT. Muscles and Tendons There is fatty atrophy of the rotator cuff musculature. Soft tissues Complex joint effusion. Soft tissues are suboptimally assessed due to soft tissue atrophy. Query cachexia. Chronic small left pleural calcification in the lingula. No acute fracture of the left ribs. Humerus: Bones No acute fracture. Diffuse under mineralization. No evidence of focal lesion. Elbow alignment is maintained. Muscles and tendons No obvious intramuscular hematoma. Soft tissues Joint effusion better assessed on concurrent shoulder exam. Scattered soft tissue calcifications posteriorly that may be vascular. No obvious soft tissue hematoma. Skin laxity versus skin tear distally about the lateral aspect. IMPRESSION: 1. No acute fracture of the left shoulder or humerus. 2. Advanced glenohumeral osteoarthritis. 3. Large complex joint effusion. Subcortical irregularity involving the circumferential humeral head is likely related to chronic joint inflammation. This can be further assessed with MRI based on clinical concern, if patient is able to tolerate. Electronically Signed   By: Keith Rake M.D.   On: 10/07/2019 21:13   US RENAL  Result Date: 10/11/2019 CLINICAL DATA:  86 year old female with acute renal insufficiency. EXAM: RENAL /  URINARY TRACT ULTRASOUND COMPLETE COMPARISON:  None. FINDINGS: Right Kidney: Renal measurements: 10.2 x 4.2 x 4.9 cm = volume: 111 mL. Diffusely echogenic renal cortex (image 5). No hydronephrosis. Several small simple and benign appearing renal cysts are identified, up to 18 mm diameter (image 29). Fewer than 10 cysts. Left Kidney: Renal measurements: 10.2 x 4.7 x 5.0 cm = volume: 126 mL. Echogenic left renal cortex (image 43). No hydronephrosis. No left renal lesion identified. Bladder: Diminutive and unremarkable, estimated bladder volume 78 mL. No ureteral jets detected with brief Doppler. Other: Trace or small volume ascites, most visible in the left upper quadrant. Right pleural effusion. IMPRESSION: 1. Echogenic kidneys suggesting advanced chronic medical renal disease. No acute finding. 2. Right pleural effusion, trace or small volume ascites. Electronically Signed   By: Genevie Ann M.D.   On: 10/11/2019 09:44   DG CHEST PORT 1 VIEW  Result Date: 10/13/2019 CLINICAL DATA:  Shortness of breath, COVID-19 positive EXAM: PORTABLE CHEST 1 VIEW COMPARISON:  10/11/2019 FINDINGS: Stable cardiomegaly. Atherosclerotic calcification of the aortic knob. No focal airspace consolidation, pleural effusion, or pneumothorax. Degenerative changes of the shoulders. IMPRESSION: No active disease. Electronically Signed   By: Davina Poke D.O.   On: 10/13/2019 12:37   DG CHEST PORT 1 VIEW  Result Date: 10/11/2019 CLINICAL DATA:  Cough, hypertension EXAM: PORTABLE CHEST 1 VIEW COMPARISON:  Radiograph 10/07/2019 FINDINGS: No consolidation or convincing features of pulmonary edema. No visible pneumothorax,  or effusion. Pulmonary vascularity is normally distributed. The cardiomediastinal contours are unremarkable. No acute osseous or soft tissue abnormality. Degenerative changes are present in the imaged spine and shoulders. IMPRESSION: No acute cardiopulmonary abnormality. Electronically Signed   By: Lovena Le M.D.   On:  10/11/2019 18:17   DG Shoulder Left  Result Date: 10/07/2019 CLINICAL DATA:  Left shoulder pain after fall. EXAM: LEFT SHOULDER - 2+ VIEW COMPARISON:  December 18, 2007. FINDINGS: There is no evidence of fracture or dislocation. Mild degenerative changes are seen involving the left glenohumeral joint. Soft tissues are unremarkable. IMPRESSION: Mild degenerative joint disease of the left glenohumeral joint. No acute abnormality is noted. Electronically Signed   By: Marijo Conception M.D.   On: 10/07/2019 18:27   DG Humerus Left  Result Date: 10/07/2019 CLINICAL DATA:  86 year old with pain after fall.  Unwitnessed fall. EXAM: LEFT HUMERUS - 2+ VIEW COMPARISON:  Shoulder radiograph earlier this day. FINDINGS: The proximal humerus is better assessed on recent shoulder x-rays. Distal humerus is intact. Elbow alignment is maintained. No focal soft tissue abnormality. IMPRESSION: No fracture of the left humerus, proximal humerus better assessed on shoulder radiograph earlier today. Electronically Signed   By: Keith Rake M.D.   On: 10/07/2019 20:07   DG FLUORO GUIDED NEEDLE PLC ASPIRATION/INJECTION LOC  Result Date: 10/10/2019 CLINICAL DATA:  Left shoulder pain EXAM: Left shoulder aspiration UNDER FLUOROSCOPY FLUOROSCOPY TIME:  Fluoroscopy Time:  0 minutes 24 seconds Radiation Exposure Index (if provided by the fluoroscopic device): Number of Acquired Spot Images: 0 PROCEDURE: After obtaining informed written consent, the procedure was performed. The patient's son was available for sign language assistance. Skin prep with Betadine. 2% lidocaine for local anesthesia. Review of the prior CT from 10/07/2019 demonstrates a large joint effusion. Using fluoroscopic guidance, a 22 gauge spinal needle was placed into the large joint effusion overlying the humeral head. Bloody joint fluid was aspirated. A total of 31 mL of bloody joint fluid was obtained. 6 mL was sent to the laboratory for evaluation. No immediate  complication. IMPRESSION: Successful left shoulder aspiration. 31 mL of bloody joint fluid obtained from the left shoulder. Electronically Signed   By: Franchot Gallo M.D.   On: 10/10/2019 10:20   VAS [place] UPPER EXTREMITY VENOUS DUPLEX  Result Date: 10/13/2019 UPPER VENOUS STUDY  Indications: Pain, and Swelling Limitations: Patient position. Comparison Study: No prior study Performing Technologist: Maudry Mayhew MHA, RDMS, RVT, RDCS  Examination Guidelines: A complete evaluation includes B-mode imaging, spectral Doppler, color Doppler, and power Doppler as needed of all accessible portions of each vessel. Bilateral testing is considered an integral part of a complete examination. Limited examinations for reoccurring indications may be performed as noted.  Right Findings: +----------+------------+---------+-----------+----------+--------------+ RIGHT     CompressiblePhasicitySpontaneousProperties   Summary     +----------+------------+---------+-----------+----------+--------------+ Subclavian                                          Not visualized +----------+------------+---------+-----------+----------+--------------+  Left Findings: +----------+------------+---------+-----------+----------+--------------+ LEFT      CompressiblePhasicitySpontaneousProperties   Summary     +----------+------------+---------+-----------+----------+--------------+ IJV           Full       Yes       Yes                             +----------+------------+---------+-----------+----------+--------------+  Subclavian    Full       Yes       Yes                             +----------+------------+---------+-----------+----------+--------------+ Axillary      Full       Yes       Yes                             +----------+------------+---------+-----------+----------+--------------+ Brachial      Full       Yes       Yes               Rouleax flow   +----------+------------+---------+-----------+----------+--------------+ Radial        Full                                                 +----------+------------+---------+-----------+----------+--------------+ Ulnar         Full                                                 +----------+------------+---------+-----------+----------+--------------+ Cephalic      Full                                                 +----------+------------+---------+-----------+----------+--------------+ Basilic                                             Not visualized +----------+------------+---------+-----------+----------+--------------+  Summary:  Left: No evidence of deep vein thrombosis involvign the visualized veins of the upper extremity. No evidence of superficial vein thrombosis involvign the visualized veins of the upper extremity.  *See table(s) above for measurements and observations.  Diagnosing physician: Servando Snare MD Electronically signed by Servando Snare MD on 10/13/2019 at 3:47:00 PM.    Final      Subjective: Deaf and confused, with written communication, patient denies pain or trouble breathing.  Discharge Exam: Vitals:   10/18/19 2242 10/19/19 0603  BP: (!) 159/114 (!) 151/95  Pulse: 64 75  Resp: 16 18  Temp: (!) 97.5 F (36.4 C) 97.7 F (36.5 C)  SpO2: 100% 96%   General: No distress Cardiovascular: RRR, S1/S2 +, no rubs, no gallops Respiratory: Nonlabored and clear Abdominal: Soft, NT, ND, bowel sounds + Extremities: LUE with restricted shoulder ROM, no induration, fluctuance, erythema. Diffuse edema improved with elevation.  Labs: BNP (last 3 results) No results for input(s): BNP in the last 8760 hours. Basic Metabolic Panel: Recent Labs  Lab 10/13/19 0351 10/13/19 1434 10/15/19 0430 10/16/19 0507 10/17/19 0448 10/18/19 0419 10/19/19 0443  NA 130*   < > 130* 131* 133* 133* 131*  K 5.2*   < > 5.0 4.9 5.0 5.4* 4.9  CL 99   < > 98 101 105  104 99  CO2 21*   < > 24 24 24  25 25  GLUCOSE 94   < > 106* 103* 92 103* 104*  BUN 44*   < > 58* 64* 59* 69* 75*  CREATININE 1.73*   < > 2.02* 1.87* 1.76* 1.99* 2.00*  CALCIUM 8.8*   < > 8.6* 8.9 8.9 8.7* 9.2  PHOS 3.2  --   --   --   --   --   --    < > = values in this interval not displayed.   Liver Function Tests: Recent Labs  Lab 10/14/19 0436 10/15/19 0430 10/16/19 0507 10/17/19 0448 10/18/19 0419  AST 18 25 18 18  35  ALT 17 24 22 20  34  ALKPHOS 48 54 52 50 55  BILITOT 0.4 0.5 0.2* 0.5 0.5  PROT 5.5* 5.6* 5.9* 5.5* 5.5*  ALBUMIN 2.9* 3.0* 3.2* 2.9* 2.9*   No results for input(s): LIPASE, AMYLASE in the last 168 hours. No results for input(s): AMMONIA in the last 168 hours. CBC: Recent Labs  Lab 10/14/19 0436 10/14/19 0436 10/15/19 0430 10/16/19 0507 10/17/19 0448 10/18/19 0419 10/19/19 0443  WBC 3.1*   < > 4.5 4.5 3.6* 3.7* 3.6*  NEUTROABS 1.5*  --  2.5 2.6 1.7 2.0  --   HGB 8.0*   < > 8.0* 7.6* 7.2* 7.3* 8.3*  HCT 24.2*   < > 24.4* 23.1* 22.1* 22.8* 25.4*  MCV 99.2   < > 100.8* 100.4* 102.8* 103.2* 102.0*  PLT 231   < > 274 237 222 261 315   < > = values in this interval not displayed.   Cardiac Enzymes: No results for input(s): CKTOTAL, CKMB, CKMBINDEX, TROPONINI in the last 168 hours. BNP: Invalid input(s): POCBNP CBG: Recent Labs  Lab 10/17/19 2149 10/18/19 0805 10/18/19 1142 10/18/19 1737 10/18/19 2039  GLUCAP 127* 77 130* 80 102*   D-Dimer Recent Labs    10/17/19 0448 10/18/19 0419  DDIMER 3.16* 3.31*   Hgb A1c Recent Labs    10/17/19 0448  HGBA1C 5.8*   Lipid Profile No results for input(s): CHOL, HDL, LDLCALC, TRIG, CHOLHDL, LDLDIRECT in the last 72 hours. Thyroid function studies No results for input(s): TSH, T4TOTAL, T3FREE, THYROIDAB in the last 72 hours.  Invalid input(s): FREET3 Anemia work up Recent Labs    10/17/19 0448  VITAMINB12 470  FOLATE 6.7  FERRITIN 55  TIBC 252  IRON 40  RETICCTPCT 1.9   Urinalysis     Component Value Date/Time   COLORURINE YELLOW 08/22/2017 1219   APPEARANCEUR HAZY (A) 08/22/2017 1219   LABSPEC 1.023 08/22/2017 1219   PHURINE 6.0 08/22/2017 1219   GLUCOSEU NEGATIVE 08/22/2017 1219   HGBUR NEGATIVE 08/22/2017 1219   [place] 08/22/2017 1219   [place] 08/22/2017 1219   PROTEINUR NEGATIVE 08/22/2017 1219   UROBILINOGEN 0.2 12/22/2010 1421   NITRITE NEGATIVE 08/22/2017 1219   LEUKOCYTESUR SMALL (A) 08/22/2017 1219    Microbiology Recent Results (from the past 240 hour(s))  Body fluid culture     Status: None   Collection Time: 10/10/19  9:00 AM   Specimen: Joint, Left Shoulder; Synovial Fluid  Result Value Ref Range Status   Specimen Description   Final    FLUID SYNOVIAL LEFT SHOULDER Performed at [facility], [place] [address], [place], [place] [address]    Special Requests   Final    NONE Performed at [facility], [place] [address] [place], [place] [address]    Gram Stain   Final    MODERATE  WBC PRESENT,BOTH PMN AND MONONUCLEAR NO ORGANISMS SEEN Gram Stain Report Called to,Read Back By and Verified With: BULLINS,H @ 8119 ON [id number] BY POTEAT,S Performed at [facility], [place] [address], [place], [place] [address]    Culture   Final    NO GROWTH 3 DAYS Performed at [facility], [place] [address], [place], [place] [address]    Report Status 10/13/2019 FINAL  Final  SARS CORONAVIRUS 2 (TAT 6-24 HRS) Nasopharyngeal Nasopharyngeal Swab     Status: Abnormal   Collection Time: 10/12/19  7:33 PM   Specimen: Nasopharyngeal Swab  Result Value Ref Range Status   SARS Coronavirus 2 POSITIVE (A) NEGATIVE Final    Comment: RESULT CALLED TO, READ BACK BY AND VERIFIED WITH: Aquilla Hacker [id number] 10/13/2019 T. TYSOR (NOTE) SARS-CoV-2 target nucleic acids are DETECTED.  The SARS-CoV-2 RNA is generally detectable in upper and lower respiratory specimens during the acute phase of infection.  Positive results are indicative of the presence of SARS-CoV-2 RNA. Clinical correlation with patient history and other diagnostic information is  necessary to determine patient infection status. Positive results do not rule out bacterial infection or co-infection with other viruses.  The expected result is Negative.  Fact Sheet for Patients: SugarRoll.be  Fact Sheet for Healthcare Providers: https://www.woods-mathews.com/  This test is not yet approved or cleared by the [place] FDA and  has been authorized for detection and/or diagnosis of SARS-CoV-2 by FDA under an Emergency Use Authorization (EUA). This EUA will remain  in effect (meaning this test can be used ) for the duration of the COVID-19 declaration under Section 564(b)(1) of the Act, 21 U.S.C. section 360bbb-3(b)(1), unless the authorization is terminated or revoked sooner.   Performed at [facility], [place] [address] [place], [place] [address]   SARS Coronavirus 2 by RT PCR (hospital order, performed in [facility] hospital lab) Nasopharyngeal Nasopharyngeal Swab     Status: Abnormal   Collection Time: 10/13/19  8:39 AM   Specimen: Nasopharyngeal Swab  Result Value Ref Range Status   SARS Coronavirus 2 POSITIVE (A) NEGATIVE Final    Comment: RESULT CALLED TO, READ BACK BY AND VERIFIED WITH: SAUNDARS,L RN @1252  ON 10/13/19 JACKSON,K (NOTE) SARS-CoV-2 target nucleic acids are DETECTED  SARS-CoV-2 RNA is generally detectable in upper respiratory specimens  during the acute phase of infection.  Positive results are indicative  of the presence of the identified virus, but do not rule out bacterial infection or co-infection with other pathogens not detected by the test.  Clinical correlation with patient history and  other diagnostic information is necessary to determine patient infection status.  The expected result is negative.  Fact Sheet for Patients:    StrictlyIdeas.no   Fact Sheet for Healthcare Providers:   BankingDealers.co.za    This test is not yet approved or cleared by the [place] FDA and  has been authorized for detection and/or diagnosis of SARS-CoV-2 by FDA under an Emergency Use Authorization (EUA).  This EUA will remain in effect (meaning t his test can be used) for the duration of  the COVID-19 declaration under Section 564(b)(1) of the Act, 21 U.S.C. section 360-bbb-3(b)(1), unless the authorization is terminated or revoked sooner.  Performed at [facility], [place] [address], [place], [place] [address]     Time coordinating discharge: Approximately 40 minutes  Patrecia Pour, MD  [facility] 10/19/2019, 7:39 AM

## 2019-10-19 NOTE — Plan of Care (Signed)
  Problem: Clinical Measurements: Goal: Diagnostic test results will improve Outcome: Progressing   Problem: Nutrition: Goal: Adequate nutrition will be maintained Outcome: Progressing   Problem: Pain Managment: Goal: General experience of comfort will improve Outcome: Progressing   

## 2019-10-19 NOTE — TOC Progression Note (Signed)
Transition of Care [facility]) - Progression Note    Patient Details  Name: [name] MRN: [id number] Date of Birth: 06/27/1932  Transition of Care [facility]) CM/SW Contact  Lennart Pall, LCSW Phone Number: 10/19/2019, 3:08 PM  Clinical Narrative:   Spoke with [place] with [facility] SNF today who reports facility cannot admit pt until she has completed her COVID isolation period.  Per MD, this should complete 6/29 pm and he requests that we look at other facilities that could admit pt now while still under isolation.  I have resent her information out to all area facilities and will monitor if other offers can be extended.      Expected Discharge Plan: [place] Barriers to Discharge: Continued Medical Work up  Expected Discharge Plan and Services Expected Discharge Plan: [place] In-house Referral: Clinical Social Work   Post Acute Care Choice: [place] Living arrangements for the past 2 months: [facility] ([facility]) Expected Discharge Date: 10/19/19                 DME Agency: NA       HH Arranged: NA           Social Determinants of Health (SDOH) Interventions    Readmission Risk Interventions No flowsheet data found.

## 2019-10-20 LAB — GLUCOSE, CAPILLARY
Glucose-Capillary: 99 mg/dL (ref 70–99)
Glucose-Capillary: 125 mg/dL — ABNORMAL HIGH (ref 70–99)
Glucose-Capillary: 81 mg/dL (ref 70–99)
Glucose-Capillary: 91 mg/dL (ref 70–99)

## 2019-10-20 NOTE — Progress Notes (Signed)
Patient seen on rounds this morning. Was discharged yesterday to SNF though they are unable to accommodate this patient of theirs during her period of covid isolation. This will end on 6/29 in the evening (initial positive test was around 7:30pm on 6/19). Therefore, alternative options are being pursued as she has maximized benefit of hospitalization. No significant overnight events have been noted and she remains stable for discharge once a position at an appropriate level of care has been secured. If this is not possible, we will plan to discharge back to her long term SNF 6/30.  Vance Gather, MD 10/20/2019 9:25 AM

## 2019-10-21 DIAGNOSIS — M19012 Primary osteoarthritis, left shoulder: Secondary | ICD-10-CM | POA: Diagnosis not present

## 2019-10-21 DIAGNOSIS — U071 COVID-19: Secondary | ICD-10-CM | POA: Diagnosis not present

## 2019-10-21 DIAGNOSIS — R2681 Unsteadiness on feet: Secondary | ICD-10-CM | POA: Diagnosis not present

## 2019-10-21 DIAGNOSIS — R198 Other specified symptoms and signs involving the digestive system and abdomen: Secondary | ICD-10-CM | POA: Diagnosis not present

## 2019-10-21 DIAGNOSIS — Z7401 Bed confinement status: Secondary | ICD-10-CM | POA: Diagnosis not present

## 2019-10-21 DIAGNOSIS — L97509 Non-pressure chronic ulcer of other part of unspecified foot with unspecified severity: Secondary | ICD-10-CM | POA: Diagnosis not present

## 2019-10-21 DIAGNOSIS — H93013 Transient ischemic deafness, bilateral: Secondary | ICD-10-CM | POA: Diagnosis not present

## 2019-10-21 DIAGNOSIS — M25512 Pain in left shoulder: Secondary | ICD-10-CM | POA: Diagnosis not present

## 2019-10-21 DIAGNOSIS — F0390 Unspecified dementia without behavioral disturbance: Secondary | ICD-10-CM | POA: Diagnosis not present

## 2019-10-21 DIAGNOSIS — I160 Hypertensive urgency: Secondary | ICD-10-CM | POA: Diagnosis not present

## 2019-10-21 DIAGNOSIS — M25412 Effusion, left shoulder: Secondary | ICD-10-CM | POA: Diagnosis not present

## 2019-10-21 DIAGNOSIS — M1990 Unspecified osteoarthritis, unspecified site: Secondary | ICD-10-CM | POA: Diagnosis not present

## 2019-10-21 DIAGNOSIS — F8089 Other developmental disorders of speech and language: Secondary | ICD-10-CM | POA: Diagnosis not present

## 2019-10-21 DIAGNOSIS — I150 Renovascular hypertension: Secondary | ICD-10-CM | POA: Diagnosis not present

## 2019-10-21 DIAGNOSIS — Z8616 Personal history of COVID-19: Secondary | ICD-10-CM | POA: Diagnosis not present

## 2019-10-21 DIAGNOSIS — E785 Hyperlipidemia, unspecified: Secondary | ICD-10-CM | POA: Diagnosis not present

## 2019-10-21 DIAGNOSIS — E119 Type 2 diabetes mellitus without complications: Secondary | ICD-10-CM | POA: Diagnosis not present

## 2019-10-21 DIAGNOSIS — D500 Iron deficiency anemia secondary to blood loss (chronic): Secondary | ICD-10-CM | POA: Diagnosis not present

## 2019-10-21 DIAGNOSIS — E870 Hyperosmolality and hypernatremia: Secondary | ICD-10-CM | POA: Diagnosis not present

## 2019-10-21 DIAGNOSIS — R4701 Aphasia: Secondary | ICD-10-CM | POA: Diagnosis not present

## 2019-10-21 DIAGNOSIS — H4089 Other specified glaucoma: Secondary | ICD-10-CM | POA: Diagnosis not present

## 2019-10-21 DIAGNOSIS — E7849 Other hyperlipidemia: Secondary | ICD-10-CM | POA: Diagnosis not present

## 2019-10-21 DIAGNOSIS — R4182 Altered mental status, unspecified: Secondary | ICD-10-CM | POA: Diagnosis not present

## 2019-10-21 DIAGNOSIS — H9193 Unspecified hearing loss, bilateral: Secondary | ICD-10-CM | POA: Diagnosis not present

## 2019-10-21 DIAGNOSIS — F325 Major depressive disorder, single episode, in full remission: Secondary | ICD-10-CM | POA: Diagnosis not present

## 2019-10-21 DIAGNOSIS — R531 Weakness: Secondary | ICD-10-CM | POA: Diagnosis not present

## 2019-10-21 DIAGNOSIS — E875 Hyperkalemia: Secondary | ICD-10-CM | POA: Diagnosis not present

## 2019-10-21 DIAGNOSIS — M19011 Primary osteoarthritis, right shoulder: Secondary | ICD-10-CM | POA: Diagnosis not present

## 2019-10-21 DIAGNOSIS — S43015A Anterior dislocation of left humerus, initial encounter: Secondary | ICD-10-CM | POA: Diagnosis not present

## 2019-10-21 DIAGNOSIS — L89152 Pressure ulcer of sacral region, stage 2: Secondary | ICD-10-CM | POA: Diagnosis not present

## 2019-10-21 DIAGNOSIS — N1832 Chronic kidney disease, stage 3b: Secondary | ICD-10-CM | POA: Diagnosis not present

## 2019-10-21 DIAGNOSIS — F0280 Dementia in other diseases classified elsewhere without behavioral disturbance: Secondary | ICD-10-CM | POA: Diagnosis not present

## 2019-10-21 DIAGNOSIS — D538 Other specified nutritional anemias: Secondary | ICD-10-CM | POA: Diagnosis not present

## 2019-10-21 DIAGNOSIS — N178 Other acute kidney failure: Secondary | ICD-10-CM | POA: Diagnosis not present

## 2019-10-21 DIAGNOSIS — M2550 Pain in unspecified joint: Secondary | ICD-10-CM | POA: Diagnosis not present

## 2019-10-21 DIAGNOSIS — G248 Other dystonia: Secondary | ICD-10-CM | POA: Diagnosis not present

## 2019-10-21 DIAGNOSIS — D638 Anemia in other chronic diseases classified elsewhere: Secondary | ICD-10-CM | POA: Diagnosis not present

## 2019-10-21 DIAGNOSIS — D508 Other iron deficiency anemias: Secondary | ICD-10-CM | POA: Diagnosis not present

## 2019-10-21 DIAGNOSIS — N184 Chronic kidney disease, stage 4 (severe): Secondary | ICD-10-CM | POA: Diagnosis not present

## 2019-10-21 DIAGNOSIS — D518 Other vitamin B12 deficiency anemias: Secondary | ICD-10-CM | POA: Diagnosis not present

## 2019-10-21 DIAGNOSIS — H409 Unspecified glaucoma: Secondary | ICD-10-CM | POA: Diagnosis not present

## 2019-10-21 LAB — GLUCOSE, CAPILLARY
Glucose-Capillary: 75 mg/dL (ref 70–99)
Glucose-Capillary: 199 mg/dL — ABNORMAL HIGH (ref 70–99)

## 2019-10-21 NOTE — Progress Notes (Signed)
Physical Therapy Treatment Patient Details Name: [name] MRN: [id number] DOB: [birth year] Today's Date: 10/21/2019    History of Present Illness 86 yo female admitted with ARF, L shoulder pain 2* effusion/OA. Hx of deafness, dementia, DM, ulcers, syncopal episodes, ETOH abuse, CKD    PT Comments    Pt declined OOB activity on today. She did agree to bed level exercises. Continue to recommend SNF.    Follow Up Recommendations  SNF     Equipment Recommendations  None recommended by PT    Recommendations for Other Services       Precautions / Restrictions Precautions Precautions: Fall Precaution Comments: L shoulder pain (poor functional use) Restrictions Weight Bearing Restrictions: No    Mobility  Bed Mobility               General bed mobility comments: pt declined OOB on today  Transfers                    Ambulation/Gait                 Stairs             Wheelchair Mobility    Modified Rankin (Stroke Patients Only)       Balance                                            Cognition Arousal/Alertness: Awake/alert Behavior During Therapy: WFL for tasks assessed/performed Overall Cognitive Status: Difficult to assess                                 General Comments: sign language interpreter-unsuccessful      Exercises General Exercises - Lower Extremity Ankle Circles/Pumps: AROM;Both;10 reps;Supine Heel Slides: AAROM;Both;10 reps;Supine Hip ABduction/ADduction: AROM;AAROM;Both;10 reps;Supine Straight Leg Raises: AROM;AAROM;Both;10 reps;Supine    General Comments        Pertinent Vitals/Pain Pain Assessment: Faces Faces Pain Scale: No hurt    Home Living                      Prior Function            PT Goals (current goals can now be found in the care plan section) Progress towards PT goals: Progressing toward goals    Frequency    Min 2X/week       PT Plan Current plan remains appropriate    Co-evaluation              AM-PAC PT "6 Clicks" Mobility   Outcome Measure  Help needed turning from your back to your side while in a flat bed without using bedrails?: A Lot Help needed moving from lying on your back to sitting on the side of a flat bed without using bedrails?: A Lot Help needed moving to and from a bed to a chair (including a wheelchair)?: A Lot Help needed standing up from a chair using your arms (e.g., wheelchair or bedside chair)?: A Lot Help needed to walk in hospital room?: Total Help needed climbing 3-5 steps with a railing? : Total 6 Click Score: 10    End of Session   Activity Tolerance: Patient tolerated treatment well Patient left: in bed;with call bell/phone within reach;with bed alarm set   PT Visit Diagnosis: Muscle weakness (  generalized) (M62.81);Pain;History of falling (Z91.81)     Time: 6886-4847 PT Time Calculation (min) (ACUTE ONLY): 18 min  Charges:  $Therapeutic Exercise: 8-22 mins                         Doreatha Massed, PT Acute Rehabilitation  Office: [phone] Pager: [phone]

## 2019-10-21 NOTE — TOC Progression Note (Signed)
Transition of Care [facility]) - Progression Note    Patient Details  Name: [name] MRN: [id number] Date of Birth: [birth year]  [facility]) CM/SW Contact  Purcell Mouton, RN Phone Number: 10/21/2019, 12:10 PM  Clinical Narrative:     [place] admission coordinator revealed that Pt may return today. MD and son notified. Will need updated discharge summary.  Expected Discharge Plan: [place] Barriers to Discharge: Continued Medical Work up  Expected Discharge Plan and Services Expected Discharge Plan: [place] In-house Referral: Clinical Social Work   Post Acute Care Choice: [place] Living arrangements for the past 2 months: [place] ([facility]) Expected Discharge Date: 10/19/19                 DME Agency: NA       HH Arranged: NA           Social Determinants of Health (SDOH) Interventions    Readmission Risk Interventions No flowsheet data found.

## 2019-10-21 NOTE — TOC Progression Note (Signed)
Transition of Care [facility]) - Progression Note    Patient Details  Name: [name] MRN: [id number] Date of Birth: [birth year]  Transition of Care [facility]) CM/SW Contact  Purcell Mouton, RN Phone Number: 10/21/2019, 12:32 PM  Clinical Narrative:    Discharge summary sent to SNF.    Expected Discharge Plan: [place] Barriers to Discharge: Continued Medical Work up  Expected Discharge Plan and Services Expected Discharge Plan: [place] In-house Referral: Clinical Social Work   Post Acute Care Choice: [place] Living arrangements for the past 2 months: [place] ([facility]) Expected Discharge Date: 10/19/19                 DME Agency: NA       HH Arranged: NA           Social Determinants of Health (SDOH) Interventions    Readmission Risk Interventions No flowsheet data found.

## 2019-10-21 NOTE — TOC Progression Note (Signed)
Transition of Care [facility]) - Progression Note    Patient Details  Name: [name] MRN: [id number] Date of Birth: 1932/12/03  Transition of Care [facility]) CM/SW Contact  Purcell Mouton, RN Phone Number: 10/21/2019, 11:33 AM  Clinical Narrative:    Waiting to see if SNF can take pt today related to pt being in isolation for two more days.    Expected Discharge Plan: [place] Barriers to Discharge: Continued Medical Work up  Expected Discharge Plan and Services Expected Discharge Plan: [place] In-house Referral: Clinical Social Work   Post Acute Care Choice: [place] Living arrangements for the past 2 months: [place] ([facility]) Expected Discharge Date: 10/19/19                 DME Agency: NA       HH Arranged: NA           Social Determinants of Health (SDOH) Interventions    Readmission Risk Interventions No flowsheet data found.

## 2019-10-21 NOTE — Discharge Summary (Signed)
The following is the discharge summary from 10/19/2019 which remains accurate:   Physician Discharge Summary  [name] WCH:[id number] DOB: 12/31/1932 DOA: 10/07/2019  PCP: Lynnell Catalan, FNP  Admit date: 10/07/2019 Discharge date: 10/21/2019  Admitted From: SNF Disposition: SNF   Recommendations for Outpatient Follow-up:   - Follow up with PCP in 1-2 weeks  - Follow up with orthopedics in 2-4 weeks for continued management of left shoulder arthritis. - Increase clonidine and monitor BP - Stop metformin with CrCl <56ml/min - Follow up with nephrology recommended - Continue oral iron supplementation. Received IV iron and EPO 6/25. - Monitor CBC, renal function panel within the next week - Requires 10 days of isolation from positive testing for asymptomatic covid-19 infection diagnosed on 6/19. - Continue goals of care discussions and palliative care involvement  Home Health: None Equipment/Devices: Per SNF Discharge Condition: Stable CODE STATUS: Full (confirmed with son during this admission) Diet recommendation: Heart healthy  Brief/Interim Summary: [name] is an 86 y.o. female with a history of dementia, deafness, T2DM, stage IIIb CKD, and HTN who presented to the ED 6/14 from [facility] with left shoulder pain found to have effusion and AKI. Left shoulder effusion was tapped, not consistent with infection, and treated supportively. SARS-CoV-2 testing, per screening admission protocol, was positive on 6/19, though the patient was asymptomatic without infiltrates on CXR, hypoxemia, or elevation in CRP. Isolation was continued through admission and will need to continue for 10 total days. IV fluids were given with limited improvement in creatinine, and the patient's per oral intake has improved. Renal ultrasound suggests the patient's elevated creatinine represents progressive CKD from uncontrolled HTN. Modifications to antihypertensive regimen were made with some  improvement. Iron and EPO have been given for anemia of chronic disease with iron deficiency, though no active bleeding has been noted. She is stable for discharge back to SNF.  Discharge Diagnoses:  Principal Problem:   ARF (acute renal failure) (HCC) Active Problems:   Hyperkalemia   Swelling of joint of left shoulder   Hypertensive urgency   DM type 2 (diabetes mellitus, type 2) (HCC)   Dementia (HCC)   Acute renal failure (ARF) (HCC)   Pressure injury of skin   AKI (acute kidney injury) ([facility])  AKI on stage IIIb CKD initial diagnosis, now actually suspect progressive CKD, now stage IV CKD: Baseline SCr presumed to have been 1.2 based on remote value. SCr 1.8 on admission and only transiently improved from that. Renal U/S showed echogenic kidneys suggesting advanced chronic medical renal disease, without acute finding.  - Manage HTN - Monitor BMP regularly, renally dose medications, avoid nephrotoxins - Would benefit from nephrology follow up.  - Managing AOCD as below.  Left shoulder osteoarthritis with effusion s/p 30cc drainage by IR on 6/17. Studies not concerning for septic arthritis. Culture negative, gram stain negative. - Continue PT/OT, ROM. Recommend orthopedic surgery follow up after discharge. - Continue pain control. - Continue LUE elevation to treat edema with negative U/S for DVT or SVT.   SARS-CoV-2 positivity: Ct value is 39 consistent with very small concentration of nucleic acid target on PCR. CRP not elevated. No respiratory symptoms. Suspect asymptomatic infection vs. noninfectious viral shedding. No PNA on CXR. - Continue isolation for 10 days from date of positive test (was 6/19) given the patient does not have severe symptoms. - No indication for antiviral Tx or steroids at this time.   NIDT2DM: Improved, HbA1c 5.8%. - Continue SSI, will discontinue metformin with  CKD.   HTN with HTN urgency: Severe range BP has returned. - Continue coreg, clonidine.  Persistently elevated, will augment clonidine. Added norvasc as inpatient, can consider this if increased clonidine is ineffective.  HLD:  - Continue statin  Dementia and depression: At very high risk of delirium with visitation restrictions and language barrier (deafness with inattention limiting use of video remote interpretors). - Continue aricept, SSRI, trazodone, remeron  Glaucoma:  - Continue gtt's  Iron deficiency anemia and anemia of CKD: Folate, B12 levels wnl.  - Discussed with nephrology, Dr. Moshe Cipro, recommended feraheme x1 and darbepoetin to which the patient's son consented on 6/25. No gross bleeding noted.  - Continue iron supplementation po as well. - Recheck on day of discharge shows hgb improved to 8.3g/dl. Recommend continued follow up.  Hyponatremia: Chronic, improved.   Hyperkalemia: Resolved with lokelma x1. Needs to be monitored. Avoid ACE/ARB/spiro  Left buttock pressure ulcer stage I POA. Continue foam dressing. Pressure Injury 10/08/19 Buttocks Right;Left Stage 1 - Intact skin with non-blanchable redness of a localized area usually over a bony prominence. (Active)  10/08/19 1300  Location: Buttocks  Location Orientation: Right;Left  Staging: Stage 1 - Intact skin with non-blanchable redness of a localized area usually over a bony prominence.  Wound Description (Comments):   Present on Admission: Yes   Discharge Instructions Discharge Instructions    Diet - low sodium heart healthy   Complete by: As directed    Discharge instructions   Complete by: As directed    - Increase clonidine and monitor BP - Stop metformin with CrCl <76ml/min - Follow up with nephrology recommended - Continue oral iron supplementation. Received IV iron and EPO 6/25. - Monitor CBC, renal function panel within the next week - Requires 10 days of isolation from positive testing for asymptomatic covid-19 infection diagnosed on 6/19. - Continue goals of care  discussions and palliative care involvement   Discharge wound care:   Complete by: As directed    Wound care to ulcerations on bilateral hammer-toes deformities:  Cleanse with NS, pat dry.  Apply a betadine swabstick to both lesions, allow to air-dry before replacing sock.  No dressing.     Allergies as of 10/21/2019   No Known Allergies     Medication List    STOP taking these medications   metFORMIN 1000 MG tablet Commonly known as: GLUCOPHAGE     TAKE these medications   Artificial Tears 0.2-0.2-1 % Soln Generic drug: Glycerin-Hypromellose-PEG 400 Place 1 drop into both eyes 2 (two) times daily. (1200 and 1900)   aspirin 81 MG chewable tablet Chew 81 mg by mouth daily. (0700)   carvedilol 25 MG tablet Commonly known as: COREG Take 25 mg by mouth 2 (two) times daily. (0700 & 1900)   cloNIDine 0.3 MG tablet Commonly known as: CATAPRES Take 1 tablet (0.3 mg total) by mouth 2 (two) times daily. (0700) What changed: when to take this   Combigan 0.2-0.5 % ophthalmic solution Generic drug: brimonidine-timolol Place 1 drop into both eyes 2 (two) times daily. (Separate from other eye drops by at least 10 minutes)   docusate sodium 100 MG capsule Commonly known as: COLACE Take 100 mg by mouth daily. (0700)   donepezil 10 MG tablet Commonly known as: ARICEPT Take 10 mg by mouth at bedtime. (1900)   doxazosin 8 MG tablet Commonly known as: CARDURA Take 16 mg by mouth at bedtime. (1900)   escitalopram 10 MG tablet Commonly known as: LEXAPRO Take 10  mg by mouth daily. (0700)   ferrous sulfate 325 (65 FE) MG tablet Take 325 mg by mouth daily. (0700)   Ilevro 0.3 % ophthalmic suspension Generic drug: nepafenac Place 1 drop into the left eye daily.   INSTA-GLUCOSE PO Take 1 Dose by mouth daily as needed (for blood sugar less than 70--recheck in 15 minutes.).   lovastatin 10 MG tablet Commonly known as: MEVACOR Take 10 mg by mouth at bedtime.   mirtazapine 15 MG  tablet Commonly known as: REMERON Take 15 mg by mouth at bedtime.   NUTRITIONAL DRINK PO Take 1 each by mouth in the morning and at bedtime.   traZODone 50 MG tablet Commonly known as: DESYREL Take 50 mg by mouth at bedtime. (1900)            Discharge Care Instructions  (From admission, onward)         Start     Ordered   10/19/19 0000  Discharge wound care:       Comments: Wound care to ulcerations on bilateral hammer-toes deformities:  Cleanse with NS, pat dry.  Apply a betadine swabstick to both lesions, allow to air-dry before replacing sock.  No dressing.   10/19/19 9163          Follow-up Information    Polite, Amador Cunas, FNP. Schedule an appointment as soon as possible for a visit.   Specialty: Family Medicine Contact information: [address] [place]  [address] [phone]              No Known Allergies  Consultations:  Nephrology by phone  Procedures/Studies: DG Chest 1 View  Result Date: 10/07/2019 CLINICAL DATA:  Fall. EXAM: CHEST  1 VIEW COMPARISON:  March 18, 2018. FINDINGS: Stable cardiomegaly. No pneumothorax or pleural effusion is noted. Both lungs are clear. The visualized skeletal structures are unremarkable. IMPRESSION: No active disease. Electronically Signed   By: Marijo Conception M.D.   On: 10/07/2019 18:26   DG Pelvis 1-2 Views  Result Date: 10/07/2019 CLINICAL DATA:  Fall today. EXAM: PELVIS - 1-2 VIEW COMPARISON:  January 27, 2007. FINDINGS: There is no evidence of pelvic fracture or diastasis. No pelvic bone lesions are seen. IMPRESSION: Negative. Electronically Signed   By: Marijo Conception M.D.   On: 10/07/2019 18:30   DG Shoulder Right  Result Date: 10/07/2019 CLINICAL DATA:  Bilateral shoulder pain after fall today. EXAM: RIGHT SHOULDER - 2+ VIEW COMPARISON:  February 21, 2011. FINDINGS: There is no evidence of fracture or dislocation. Severe degenerative changes seen involving the right glenohumeral joint. Soft  tissues are unremarkable. IMPRESSION: Severe degenerative joint disease of the right glenohumeral joint. No acute abnormality seen in the right shoulder. Electronically Signed   By: Marijo Conception M.D.   On: 10/07/2019 18:28   CT Head Wo Contrast  Result Date: 10/07/2019 CLINICAL DATA:  Unwitnessed fall. Complains of left shoulder injury. EXAM: CT HEAD WITHOUT CONTRAST CT CERVICAL SPINE WITHOUT CONTRAST TECHNIQUE: Multidetector CT imaging of the head and cervical spine was performed following the standard protocol without intravenous contrast. Multiplanar CT image reconstructions of the cervical spine were also generated. COMPARISON:  10/25/2017 FINDINGS: CT HEAD FINDINGS Brain: No evidence of acute infarction, hemorrhage, hydrocephalus, extra-axial collection or mass lesion/mass effect. There is mild diffuse low-attenuation within the subcortical and periventricular white matter compatible with chronic microvascular disease. Chronic left basal ganglia lacunar infarct noted. Vascular: No hyperdense vessel or unexpected calcification. Skull: Normal. Negative for fracture or focal lesion.  Sinuses/Orbits: No acute finding. Other: None. CT CERVICAL SPINE FINDINGS Alignment: Normal. Skull base and vertebrae: No acute fracture. No primary bone lesion or focal pathologic process. Soft tissues and spinal canal: No prevertebral fluid or swelling. No visible canal hematoma. Disc levels: Marked multi level disc space narrowing and endplate spurring is identified. Upper chest: Negative. Other: None IMPRESSION: 1. No acute intracranial abnormalities. Chronic small vessel ischemic change and brain atrophy. 2. No evidence for cervical spine fracture. 3. Advanced cervical degenerative disc disease. Electronically Signed   By: Kerby Moors M.D.   On: 10/07/2019 18:40   CT Cervical Spine Wo Contrast  Result Date: 10/07/2019 CLINICAL DATA:  Unwitnessed fall. Complains of left shoulder injury. EXAM: CT HEAD WITHOUT CONTRAST  CT CERVICAL SPINE WITHOUT CONTRAST TECHNIQUE: Multidetector CT imaging of the head and cervical spine was performed following the standard protocol without intravenous contrast. Multiplanar CT image reconstructions of the cervical spine were also generated. COMPARISON:  10/25/2017 FINDINGS: CT HEAD FINDINGS Brain: No evidence of acute infarction, hemorrhage, hydrocephalus, extra-axial collection or mass lesion/mass effect. There is mild diffuse low-attenuation within the subcortical and periventricular white matter compatible with chronic microvascular disease. Chronic left basal ganglia lacunar infarct noted. Vascular: No hyperdense vessel or unexpected calcification. Skull: Normal. Negative for fracture or focal lesion. Sinuses/Orbits: No acute finding. Other: None. CT CERVICAL SPINE FINDINGS Alignment: Normal. Skull base and vertebrae: No acute fracture. No primary bone lesion or focal pathologic process. Soft tissues and spinal canal: No prevertebral fluid or swelling. No visible canal hematoma. Disc levels: Marked multi level disc space narrowing and endplate spurring is identified. Upper chest: Negative. Other: None IMPRESSION: 1. No acute intracranial abnormalities. Chronic small vessel ischemic change and brain atrophy. 2. No evidence for cervical spine fracture. 3. Advanced cervical degenerative disc disease. Electronically Signed   By: Kerby Moors M.D.   On: 10/07/2019 18:40   CT HUMERUS LEFT WO CONTRAST  Result Date: 10/07/2019 CLINICAL DATA:  Left shoulder and upper arm pain after injury. Unwitnessed fall today. EXAM: CT OF THE UPPER LEFT EXTREMITY WITHOUT CONTRAST CT OF THE LEFT SHOULDER WITHOUT CONTRAST. TECHNIQUE: Multidetector CT imaging of the shoulder and upper left extremity was performed according to the standard protocol. COMPARISON:  Radiographs earlier today FINDINGS: Shoulder: Bones/Joint/Cartilage No acute fracture. Advanced glenohumeral osteoarthritis with joint space narrowing,  spurring, subchondral cystic change. There is some bony fragmentation about the inferior aspect of the glenohumeral joint extending into the axillary recess. Subcortical irregularity cystic changes circumferentially involving the humeral head including the non articular surface. Heterogeneous joint effusion extending into the axillary recess, incompletely assessed on this noncontrast exam. Chronic periosteal thickening versus bony fragmentation involving the subacromial scapula. Ligaments Suboptimally assessed by CT. Muscles and Tendons There is fatty atrophy of the rotator cuff musculature. Soft tissues Complex joint effusion. Soft tissues are suboptimally assessed due to soft tissue atrophy. Query cachexia. Chronic small left pleural calcification in the lingula. No acute fracture of the left ribs. Humerus: Bones No acute fracture. Diffuse under mineralization. No evidence of focal lesion. Elbow alignment is maintained. Muscles and tendons No obvious intramuscular hematoma. Soft tissues Joint effusion better assessed on concurrent shoulder exam. Scattered soft tissue calcifications posteriorly that may be vascular. No obvious soft tissue hematoma. Skin laxity versus skin tear distally about the lateral aspect. IMPRESSION: 1. No acute fracture of the left shoulder or humerus. 2. Advanced glenohumeral osteoarthritis. 3. Large complex joint effusion. Subcortical irregularity involving the circumferential humeral head is likely related to chronic joint  inflammation. This can be further assessed with MRI based on clinical concern, if patient is able to tolerate. Electronically Signed   By: Keith Rake M.D.   On: 10/07/2019 21:13   CT SHOULDER LEFT WO CONTRAST  Result Date: 10/07/2019 CLINICAL DATA:  Left shoulder and upper arm pain after injury. Unwitnessed fall today. EXAM: CT OF THE UPPER LEFT EXTREMITY WITHOUT CONTRAST CT OF THE LEFT SHOULDER WITHOUT CONTRAST. TECHNIQUE: Multidetector CT imaging of the  shoulder and upper left extremity was performed according to the standard protocol. COMPARISON:  Radiographs earlier today FINDINGS: Shoulder: Bones/Joint/Cartilage No acute fracture. Advanced glenohumeral osteoarthritis with joint space narrowing, spurring, subchondral cystic change. There is some bony fragmentation about the inferior aspect of the glenohumeral joint extending into the axillary recess. Subcortical irregularity cystic changes circumferentially involving the humeral head including the non articular surface. Heterogeneous joint effusion extending into the axillary recess, incompletely assessed on this noncontrast exam. Chronic periosteal thickening versus bony fragmentation involving the subacromial scapula. Ligaments Suboptimally assessed by CT. Muscles and Tendons There is fatty atrophy of the rotator cuff musculature. Soft tissues Complex joint effusion. Soft tissues are suboptimally assessed due to soft tissue atrophy. Query cachexia. Chronic small left pleural calcification in the lingula. No acute fracture of the left ribs. Humerus: Bones No acute fracture. Diffuse under mineralization. No evidence of focal lesion. Elbow alignment is maintained. Muscles and tendons No obvious intramuscular hematoma. Soft tissues Joint effusion better assessed on concurrent shoulder exam. Scattered soft tissue calcifications posteriorly that may be vascular. No obvious soft tissue hematoma. Skin laxity versus skin tear distally about the lateral aspect. IMPRESSION: 1. No acute fracture of the left shoulder or humerus. 2. Advanced glenohumeral osteoarthritis. 3. Large complex joint effusion. Subcortical irregularity involving the circumferential humeral head is likely related to chronic joint inflammation. This can be further assessed with MRI based on clinical concern, if patient is able to tolerate. Electronically Signed   By: Keith Rake M.D.   On: 10/07/2019 21:13   US RENAL  Result Date:  10/11/2019 CLINICAL DATA:  86 year old female with acute renal insufficiency. EXAM: RENAL / URINARY TRACT ULTRASOUND COMPLETE COMPARISON:  None. FINDINGS: Right Kidney: Renal measurements: 10.2 x 4.2 x 4.9 cm = volume: 111 mL. Diffusely echogenic renal cortex (image 5). No hydronephrosis. Several small simple and benign appearing renal cysts are identified, up to 18 mm diameter (image 29). Fewer than 10 cysts. Left Kidney: Renal measurements: 10.2 x 4.7 x 5.0 cm = volume: 126 mL. Echogenic left renal cortex (image 43). No hydronephrosis. No left renal lesion identified. Bladder: Diminutive and unremarkable, estimated bladder volume 78 mL. No ureteral jets detected with brief Doppler. Other: Trace or small volume ascites, most visible in the left upper quadrant. Right pleural effusion. IMPRESSION: 1. Echogenic kidneys suggesting advanced chronic medical renal disease. No acute finding. 2. Right pleural effusion, trace or small volume ascites. Electronically Signed   By: Genevie Ann M.D.   On: 10/11/2019 09:44   DG CHEST PORT 1 VIEW  Result Date: 10/13/2019 CLINICAL DATA:  Shortness of breath, COVID-19 positive EXAM: PORTABLE CHEST 1 VIEW COMPARISON:  10/11/2019 FINDINGS: Stable cardiomegaly. Atherosclerotic calcification of the aortic knob. No focal airspace consolidation, pleural effusion, or pneumothorax. Degenerative changes of the shoulders. IMPRESSION: No active disease. Electronically Signed   By: Davina Poke D.O.   On: 10/13/2019 12:37   DG CHEST PORT 1 VIEW  Result Date: 10/11/2019 CLINICAL DATA:  Cough, hypertension EXAM: PORTABLE CHEST 1 VIEW COMPARISON:  Radiograph  10/07/2019 FINDINGS: No consolidation or convincing features of pulmonary edema. No visible pneumothorax, or effusion. Pulmonary vascularity is normally distributed. The cardiomediastinal contours are unremarkable. No acute osseous or soft tissue abnormality. Degenerative changes are present in the imaged spine and shoulders.  IMPRESSION: No acute cardiopulmonary abnormality. Electronically Signed   By: Lovena Le M.D.   On: 10/11/2019 18:17   DG Shoulder Left  Result Date: 10/07/2019 CLINICAL DATA:  Left shoulder pain after fall. EXAM: LEFT SHOULDER - 2+ VIEW COMPARISON:  December 18, 2007. FINDINGS: There is no evidence of fracture or dislocation. Mild degenerative changes are seen involving the left glenohumeral joint. Soft tissues are unremarkable. IMPRESSION: Mild degenerative joint disease of the left glenohumeral joint. No acute abnormality is noted. Electronically Signed   By: Marijo Conception M.D.   On: 10/07/2019 18:27   DG Humerus Left  Result Date: 10/07/2019 CLINICAL DATA:  86 year old with pain after fall.  Unwitnessed fall. EXAM: LEFT HUMERUS - 2+ VIEW COMPARISON:  Shoulder radiograph earlier this day. FINDINGS: The proximal humerus is better assessed on recent shoulder x-rays. Distal humerus is intact. Elbow alignment is maintained. No focal soft tissue abnormality. IMPRESSION: No fracture of the left humerus, proximal humerus better assessed on shoulder radiograph earlier today. Electronically Signed   By: Keith Rake M.D.   On: 10/07/2019 20:07   DG FLUORO GUIDED NEEDLE PLC ASPIRATION/INJECTION LOC  Result Date: 10/10/2019 CLINICAL DATA:  Left shoulder pain EXAM: Left shoulder aspiration UNDER FLUOROSCOPY FLUOROSCOPY TIME:  Fluoroscopy Time:  0 minutes 24 seconds Radiation Exposure Index (if provided by the fluoroscopic device): Number of Acquired Spot Images: 0 PROCEDURE: After obtaining informed written consent, the procedure was performed. The patient's son was available for sign language assistance. Skin prep with Betadine. 2% lidocaine for local anesthesia. Review of the prior CT from 10/07/2019 demonstrates a large joint effusion. Using fluoroscopic guidance, a 22 gauge spinal needle was placed into the large joint effusion overlying the humeral head. Bloody joint fluid was aspirated. A total of 31  mL of bloody joint fluid was obtained. 6 mL was sent to the laboratory for evaluation. No immediate complication. IMPRESSION: Successful left shoulder aspiration. 31 mL of bloody joint fluid obtained from the left shoulder. Electronically Signed   By: Franchot Gallo M.D.   On: 10/10/2019 10:20   VAS [place] UPPER EXTREMITY VENOUS DUPLEX  Result Date: 10/13/2019 UPPER VENOUS STUDY  Indications: Pain, and Swelling Limitations: Patient position. Comparison Study: No prior study Performing Technologist: Maudry Mayhew MHA, RDMS, RVT, RDCS  Examination Guidelines: A complete evaluation includes B-mode imaging, spectral Doppler, color Doppler, and power Doppler as needed of all accessible portions of each vessel. Bilateral testing is considered an integral part of a complete examination. Limited examinations for reoccurring indications may be performed as noted.  Right Findings: +----------+------------+---------+-----------+----------+--------------+ RIGHT     CompressiblePhasicitySpontaneousProperties   Summary     +----------+------------+---------+-----------+----------+--------------+ Subclavian                                          Not visualized +----------+------------+---------+-----------+----------+--------------+  Left Findings: +----------+------------+---------+-----------+----------+--------------+ LEFT      CompressiblePhasicitySpontaneousProperties   Summary     +----------+------------+---------+-----------+----------+--------------+ IJV           Full       Yes       Yes                             +----------+------------+---------+-----------+----------+--------------+  Subclavian    Full       Yes       Yes                             +----------+------------+---------+-----------+----------+--------------+ Axillary      Full       Yes       Yes                             +----------+------------+---------+-----------+----------+--------------+  Brachial      Full       Yes       Yes               Rouleax flow  +----------+------------+---------+-----------+----------+--------------+ Radial        Full                                                 +----------+------------+---------+-----------+----------+--------------+ Ulnar         Full                                                 +----------+------------+---------+-----------+----------+--------------+ Cephalic      Full                                                 +----------+------------+---------+-----------+----------+--------------+ Basilic                                             Not visualized +----------+------------+---------+-----------+----------+--------------+  Summary:  Left: No evidence of deep vein thrombosis involvign the visualized veins of the upper extremity. No evidence of superficial vein thrombosis involvign the visualized veins of the upper extremity.  *See table(s) above for measurements and observations.  Diagnosing physician: Servando Snare MD Electronically signed by Servando Snare MD on 10/13/2019 at 3:47:00 PM.    Final    Subjective: Deaf and confused, with written communication, patient denies pain or trouble breathing.  Discharge Exam: Vitals:   10/20/19 2143 10/21/19 0503  BP: (!) 178/99 (!) 161/98  Pulse: 72 68  Resp: (!) 22 18  Temp: (!) 97.5 F (36.4 C) (!) 97.5 F (36.4 C)  SpO2: 94% 95%   General: No distress Cardiovascular: RRR, S1/S2 +, no rubs, no gallops Respiratory: Nonlabored and clear Abdominal: Soft, NT, ND, bowel sounds + Extremities: LUE with restricted shoulder ROM, no induration, fluctuance, erythema. Diffuse edema improved with elevation.  Labs: BNP (last 3 results) No results for input(s): BNP in the last 8760 hours. Basic Metabolic Panel: Recent Labs  Lab 10/15/19 0430 10/16/19 0507 10/17/19 0448 10/18/19 0419 10/19/19 0443  NA 130* 131* 133* 133* 131*  K 5.0 4.9 5.0 5.4* 4.9   CL 98 101 105 104 99  CO2 24 24 24 25 25   GLUCOSE 106* 103* 92 103* 104*  BUN 58* 64* 59* 69* 75*  CREATININE 2.02* 1.87* 1.76* 1.99* 2.00*  CALCIUM 8.6* 8.9 8.9 8.7* 9.2   Liver Function Tests: Recent Labs  Lab 10/15/19 0430 10/16/19 0507 10/17/19 0448 10/18/19 0419  AST 25 18 18  35  ALT 24 22 20  34  ALKPHOS 54 52 50 55  BILITOT 0.5 0.2* 0.5 0.5  PROT 5.6* 5.9* 5.5* 5.5*  ALBUMIN 3.0* 3.2* 2.9* 2.9*   No results for input(s): LIPASE, AMYLASE in the last 168 hours. No results for input(s): AMMONIA in the last 168 hours. CBC: Recent Labs  Lab 10/15/19 0430 10/16/19 0507 10/17/19 0448 10/18/19 0419 10/19/19 0443  WBC 4.5 4.5 3.6* 3.7* 3.6*  NEUTROABS 2.5 2.6 1.7 2.0  --   HGB 8.0* 7.6* 7.2* 7.3* 8.3*  HCT 24.4* 23.1* 22.1* 22.8* 25.4*  MCV 100.8* 100.4* 102.8* 103.2* 102.0*  PLT 274 237 222 261 315   Cardiac Enzymes: No results for input(s): CKTOTAL, CKMB, CKMBINDEX, TROPONINI in the last 168 hours. BNP: Invalid input(s): POCBNP CBG: Recent Labs  Lab 10/20/19 0821 10/20/19 1237 10/20/19 1835 10/20/19 2146 10/21/19 0733  GLUCAP 99 125* 81 91 75   D-Dimer No results for input(s): DDIMER in the last 72 hours. Hgb A1c No results for input(s): HGBA1C in the last 72 hours. Lipid Profile No results for input(s): CHOL, HDL, LDLCALC, TRIG, CHOLHDL, LDLDIRECT in the last 72 hours. Thyroid function studies No results for input(s): TSH, T4TOTAL, T3FREE, THYROIDAB in the last 72 hours.  Invalid input(s): FREET3 Anemia work up No results for input(s): VITAMINB12, FOLATE, FERRITIN, TIBC, IRON, RETICCTPCT in the last 72 hours. Urinalysis    Component Value Date/Time   COLORURINE YELLOW 08/22/2017 1219   APPEARANCEUR HAZY (A) 08/22/2017 1219   LABSPEC 1.023 08/22/2017 1219   PHURINE 6.0 08/22/2017 1219   GLUCOSEU NEGATIVE 08/22/2017 1219   HGBUR NEGATIVE 08/22/2017 1219   BILIRUBINUR NEGATIVE 08/22/2017 1219   KETONESUR NEGATIVE 08/22/2017 1219   PROTEINUR  NEGATIVE 08/22/2017 1219   UROBILINOGEN 0.2 12/22/2010 1421   NITRITE NEGATIVE 08/22/2017 1219   LEUKOCYTESUR SMALL (A) 08/22/2017 1219    Microbiology Recent Results (from the past 240 hour(s))  SARS CORONAVIRUS 2 (TAT 6-24 HRS) Nasopharyngeal Nasopharyngeal Swab     Status: Abnormal   Collection Time: 10/12/19  7:33 PM   Specimen: Nasopharyngeal Swab  Result Value Ref Range Status   SARS Coronavirus 2 POSITIVE (A) NEGATIVE Final    Comment: RESULT CALLED TO, READ BACK BY AND VERIFIED WITH: Aquilla Hacker [id number] 10/13/2019 T. TYSOR (NOTE) SARS-CoV-2 target nucleic acids are DETECTED.  The SARS-CoV-2 RNA is generally detectable in upper and lower respiratory specimens during the acute phase of infection. Positive results are indicative of the presence of SARS-CoV-2 RNA. Clinical correlation with patient history and other diagnostic information is  necessary to determine patient infection status. Positive results do not rule out bacterial infection or co-infection with other viruses.  The expected result is Negative.  Fact Sheet for Patients: SugarRoll.be  Fact Sheet for Healthcare Providers: https://www.woods-mathews.com/  This test is not yet approved or cleared by the [place] FDA and  has been authorized for detection and/or diagnosis of SARS-CoV-2 by FDA under an Emergency Use Authorization (EUA). This EUA will remain  in effect (meaning this test can be used ) for the duration of the COVID-19 declaration under Section 564(b)(1) of the Act, 21 U.S.C. section 360bbb-3(b)(1), unless the authorization is terminated or revoked sooner.   Performed at [facility], [place] [address] [place], [place] [address]   SARS Coronavirus 2 by RT PCR (  hospital order, performed in [facility] hospital lab) Nasopharyngeal Nasopharyngeal Swab     Status: Abnormal   Collection Time: 10/13/19  8:39 AM   Specimen: Nasopharyngeal Swab  Result  Value Ref Range Status   SARS Coronavirus 2 POSITIVE (A) NEGATIVE Final    Comment: RESULT CALLED TO, READ BACK BY AND VERIFIED WITH: SAUNDARS,L RN @1252  ON 10/13/19 JACKSON,K (NOTE) SARS-CoV-2 target nucleic acids are DETECTED  SARS-CoV-2 RNA is generally detectable in upper respiratory specimens  during the acute phase of infection.  Positive results are indicative  of the presence of the identified virus, but do not rule out bacterial infection or co-infection with other pathogens not detected by the test.  Clinical correlation with patient history and  other diagnostic information is necessary to determine patient infection status.  The expected result is negative.  Fact Sheet for Patients:   StrictlyIdeas.no   Fact Sheet for Healthcare Providers:   BankingDealers.co.za    This test is not yet approved or cleared by the [place] FDA and  has been authorized for detection and/or diagnosis of SARS-CoV-2 by FDA under an Emergency Use Authorization (EUA).  This EUA will remain in effect (meaning t his test can be used) for the duration of  the COVID-19 declaration under Section 564(b)(1) of the Act, 21 U.S.C. section 360-bbb-3(b)(1), unless the authorization is terminated or revoked sooner.  Performed at [facility], [place] [address] [place], [place] [address]     Time coordinating discharge: Approximately 40 minutes  Patrecia Pour, MD  [facility] 10/21/2019, 12:25 PM

## 2019-10-21 NOTE — Discharge Instructions (Signed)
Acute Kidney Injury, Adult  Acute kidney injury is a sudden worsening of kidney function. The kidneys are organs that have several jobs. They filter the blood to remove waste products and extra fluid. They also maintain a healthy balance of minerals and hormones in the body, which helps control blood pressure and keep bones strong. With this condition, your kidneys do not do their jobs as well as they should. This condition ranges from mild to severe. Over time it may develop into long-lasting (chronic) kidney disease. Early detection and treatment may prevent acute kidney injury from developing into a chronic condition. What are the causes? Common causes of this condition include:  A problem with blood flow to the kidneys. This may be caused by: ? Low blood pressure (hypotension) or shock. ? Blood loss. ? Heart and blood vessel (cardiovascular) disease. ? Severe burns. ? Liver disease.  Direct damage to the kidneys. This may be caused by: ? Certain medicines. ? A kidney infection. ? Poisoning. ? Being around or in contact with toxic substances. ? A surgical wound. ? A hard, direct hit to the kidney area.  A sudden blockage of urine flow. This may be caused by: ? Cancer. ? Kidney stones. ? An enlarged prostate in males. What are the signs or symptoms? Symptoms of this condition may not be obvious until the condition becomes severe. Symptoms of this condition can include:  Tiredness (lethargy), or difficulty staying awake.  Nausea or vomiting.  Swelling (edema) of the face, legs, ankles, or feet.  Problems with urination, such as: ? Abdominal pain, or pain along the side of your stomach (flank). ? Decreased urine production. ? Decrease in the force of urine flow.  Muscle twitches and cramps, especially in the legs.  Confusion or trouble concentrating.  Loss of appetite.  Fever. How is this diagnosed? This condition may be diagnosed with tests, including:  Blood  tests.  Urine tests.  Imaging tests.  A test in which a sample of tissue is removed from the kidneys to be examined under a microscope (kidney biopsy). How is this treated? Treatment for this condition depends on the cause and how severe the condition is. In mild cases, treatment may not be needed. The kidneys may heal on their own. In more severe cases, treatment will involve:  Treating the cause of the kidney injury. This may involve changing any medicines you are taking or adjusting your dosage.  Fluids. You may need specialized IV fluids to balance your body's needs.  Having a catheter placed to drain urine and prevent blockages.  Preventing problems from occurring. This may mean avoiding certain medicines or procedures that can cause further injury to the kidneys. In some cases treatment may also require:  A procedure to remove toxic wastes from the body (dialysis or continuous renal replacement therapy - CRRT).  Surgery. This may be done to repair a torn kidney, or to remove the blockage from the urinary system. Follow these instructions at home: Medicines  Take over-the-counter and prescription medicines only as told by your health care provider.  Do not take any new medicines without your health care provider's approval. Many medicines can worsen your kidney damage.  Do not take any vitamin and mineral supplements without your health care provider's approval. Many nutritional supplements can worsen your kidney damage. Lifestyle  If your health care provider prescribed changes to your diet, follow them. You may need to decrease the amount of protein you eat.  Achieve and maintain a healthy  weight. If you need help with this, ask your health care provider.  Start or continue an exercise plan. Try to exercise at least 30 minutes a day, 5 days a week.  Do not use any tobacco products, such as cigarettes, chewing tobacco, and e-cigarettes. If you need help quitting, ask your  health care provider. General instructions  Keep track of your blood pressure. Report changes in your blood pressure as told by your health care provider.  Stay up to date with immunizations. Ask your health care provider which immunizations you need.  Keep all follow-up visits as told by your health care provider. This is important. Where to find more information  American Association of Kidney Patients: BombTimer.gl  National Kidney Foundation: www.kidney.[place]: https://mathis.com/  [facility]: ? www.lifeoptions.org ? www.kidneyschool.org Contact a health care provider if:  Your symptoms get worse.  You develop new symptoms. Get help right away if:  You develop symptoms of worsening kidney disease, which include: ? Headaches. ? Abnormally dark or light skin. ? Easy bruising. ? Frequent hiccups. ? Chest pain. ? Shortness of breath. ? End of menstruation in women. ? Seizures. ? Confusion or altered mental status. ? Abdominal or back pain. ? Itchiness.  You have a fever.  Your body is producing less urine.  You have pain or bleeding when you urinate. Summary  Acute kidney injury is a sudden worsening of kidney function.  Acute kidney injury can be caused by problems with blood flow to the kidneys, direct damage to the kidneys, and sudden blockage of urine flow.  Symptoms of this condition may not be obvious until it becomes severe. Symptoms may include edema, lethargy, confusion, nausea or vomiting, and problems passing urine.  This condition can usually be diagnosed with blood tests, urine tests, and imaging tests. Sometimes a kidney biopsy is done to diagnose this condition.  Treatment for this condition often involves treating the underlying cause. It is treated with fluids, medicines, dialysis, diet changes, or surgery. This information is not intended to replace advice given to you by your health care provider. Make  sure you discuss any questions you have with your health care provider. Document Revised: 03/24/2017 Document Reviewed: 04/01/2016 Elsevier Patient Education  [place].   Contrast-Induced Nephropathy Contrast-induced nephropathy (CIN) is a rare type of kidney damage caused by the dye that may be used in certain imaging tests. This dye is called a contrast medium. For these tests, the dye may be injected into a person's body to make tissue or blood vessels show up more clearly. A contrast medium may be used in imaging tests such as:  CT scans.  MRIs.  Angiograms. With CIN, the contrast medium may damage kidney cells directly. It may also cause a reaction in the kidneys that decreases blood supply and oxygen to the kidneys. Lack of blood and oxygen causes temporary kidney damage that usually gets better in about 2 weeks. Very rarely, severe damage may require the kidneys to be filtered by a machine (dialysis). What are the causes? The exact cause of CIN is not known. In some cases, there may be more than one cause. What increases the risk? The following factors may make you more likely to develop this condition:  Having kidney disease before you are exposed to contrast media. This is the main risk factor.  Having other conditions, such as: ? Dehydration. ? Diabetes. ? High blood pressure. ? Low blood pressure. ? Heart disease. ? Anemia. ?  A type of blood cancer that affects the kidneys (multiple myeloma).  Older age.  Needing an emergency exam that requires the use of contrast media.  Receiving a high dose of contrast media or receiving contrast media within 72 hours of the previous time you got it.  Having contrast media injected into a blood vessel that carries blood away from the heart (artery).  Being on a medicine that affects kidney function. These include NSAIDs, some antibiotics, and some cancer drugs. What are the signs or symptoms? Symptoms of this condition  often develop 2-3 days after getting contrast media. Symptoms may include:  Feeling tired (fatigue).  Appetite loss.  Swelling of the feet or ankles.  Puffy, swollen eyes.  Dry, itchy skin. In some cases, there are no symptoms. How is this diagnosed? This condition may be diagnosed based on:  Your symptoms and medical history. Your health care provider may suspect CIN if you recently had an imaging test or procedure that included the use of contrast media.  A physical exam.  Blood and urine tests to check for kidney damage. How is this treated? There is no specific treatment for CIN. If you develop CIN, you may be given fluids through an IV that is inserted into one of your veins. This will help keep your kidneys active. Electrolytes may be added to the fluid if you have an electrolyte imbalance. In most cases, CIN will get better in about 2 weeks. In rare cases, you may need kidney dialysis if kidney failure occurs and urine flow decreases. Even if you need dialysis, the condition usually improves over time. Follow these instructions at home:   Take over-the-counter and prescription medicines only as told by your health care provider. Over-the-counter NSAIDs, such as aspirin or ibuprofen, can increase your risk of CIN.  Return to your normal activities as told by your health care provider. Ask your health care provider what activities are safe for you.  Drink enough fluid to keep your urine pale yellow.  Always let a health care provider know that you have a history of CIN before having any test or procedure that may include contrast material.  Keep all follow-up visits as told by your health care provider. This is important. How is this prevented? Take the following steps to help reduce your risk for developing CIN:  Tell your health care provider about any risk factors you have for CIN.  Ask if a contrast medium is necessary for imaging tests or procedures you are scheduled  to have. Ask if there is an alternative to the test.  Make sure you drink plenty of fluids prior to any imaging tests or procedures that require contrast medium.  If possible, do not undergo multiple imaging tests or procedures that require contrast medium unless enough time has passed between tests. Contact a health care provider if:  You have any signs or symptoms of CIN.  You are urinating less than usual. Summary  Contrast-induced nephropathy (CIN) is a rare type of kidney damage caused by contrast medium.  The exact cause of CIN is not known. In some cases, there may be more than one cause.  Sometimes CIN does not cause any symptoms. If you do have symptoms, they may start 2-3 days after getting contrast medium.  To confirm the diagnosis, you may have blood tests and urine tests to check for kidney damage.  There is no specific treatment for CIN. In most cases, this condition will usually get better in about   2 weeks. You may be given IV fluids with electrolytes. In rare cases, dialysis is needed. This information is not intended to replace advice given to you by your health care provider. Make sure you discuss any questions you have with your health care provider. Document Revised: 07/24/2017 Document Reviewed: 07/24/2017 Elsevier Patient Education  [place].   Hyperkalemia Hyperkalemia occurs when the level of potassium in your blood is too high. Potassium is an important nutrient that helps the muscles and nerves function normally. It affects how the heart works, and it helps keep fluids and minerals balanced in the body. If there is too much potassium in your blood, it can affect your heart's ability to function normally. Potassium is normally removed (excreted) from the body by the kidneys. Hyperkalemia can result from various conditions. It can range from mild to severe. What are the causes? This condition may be caused by:  Taking in too much potassium. You can do  this by: ? Using salt substitutes. They contain large amounts of potassium. ? Taking potassium supplements. ? Eating foods that are high in potassium.  Excreting too little potassium. This can happen if: ? Your kidneys are not working properly. Kidney (renal) disease, including short-term or long-term renal failure, is a common cause of hyperkalemia. ? You are taking medicines that lower your excretion of potassium. ? You have Addison's disease. ? You have a urinary tract blockage, such as kidney stones. ? You are on treatment to mechanically clean your blood (dialysis) and you skip a treatment.  Releasing a high amount of potassium from your cells into your blood. This can happen with: ? Injury to muscles (rhabdomyolysis) or other tissues. Most potassium is stored in your muscles. ? Severe burns or infections. ? Acidic blood plasma (acidosis). Acidosis can result from many diseases, such as uncontrolled diabetes. What increases the risk? The following factors may make you more likely to develop this condition:  Kidney disease. This puts you at the highest risk.  Addison's disease. This is a condition where the adrenal glands do not produce enough hormones.  Alcoholism or heavy drug use.  Using certain blood pressure medicines, such as ACE inhibitors, angiotensin II receptor blockers (ARBs), or potassium-sparing diuretics such as spironolactone.  Severe injury or burn. What are the signs or symptoms? In many cases, there are no symptoms. However, when your potassium level becomes high enough, you may have symptoms such as:  An irregular or very slow heartbeat.  Nausea.  Tiredness (fatigue).  Confusion.  Tingling of your skin or numbness of your hands or feet.  Muscle cramps.  Muscle weakness.  Not being able to move (paralysis). How is this diagnosed? This condition may be diagnosed based on:  Your symptoms and medical history. Your health care provider will ask about  your use of prescription and non-prescription drugs.  A physical exam.  Blood tests.  An electrocardiogram (ECG). How is this treated? Treatment depends on the cause and severity of your condition. Treatment may need to be done in the hospital setting. Treatment may include:  IV glucose (sugar) along with insulin to shift potassium out of your blood and into your cells.  A medicine called albuterol to shift potassium out of your blood and into your cells.  Medicines to remove the potassium from your body.  Dialysis to remove the potassium from your body.  Calcium to protect your heart from the effects of high potassium, such as irregular rhythms (arrhythmias). Follow these instructions at home:  Take over-the-counter and prescription medicines only as told by your health care provider.  Do not take any supplements, natural products, herbs, or vitamins without reviewing them with your health care provider. Certain supplements and natural food products contain high amounts of potassium.  Limit your alcohol intake as told by your health care provider.  Do not use drugs. If you need help quitting, ask your health care provider.  If you have kidney disease, you may need to follow a low-potassium diet. A dietitian can help you learn which foods have high or low amounts of potassium.  Keep all follow-up visits as told by your health care provider. This is important. Contact a health care provider if you:  Have an irregular or very slow heartbeat.  Feel light-headed.  Feel weak.  Are nauseous.  Have tingling or numbness in your hands or feet. Get help right away if you:  Have shortness of breath.  Have chest pain or discomfort.  Pass out.  Have muscle paralysis. Summary  Hyperkalemia occurs when the level of potassium in your blood is too high.  This condition may be caused by taking in too much potassium, excreting too little potassium, or releasing a high amount of  potassium from your cells into your blood.  Hyperkalemia can result from many underlying conditions, especially chronic kidney disease, or from taking certain medicines.  Treatment of hyperkalemia may include medicine to shift potassium out of your blood and into your cells or to remove the potassium from your body.  If you have kidney disease, you may need to follow a low-potassium diet. A dietitian can help you learn which foods have high or low amounts of potassium. This information is not intended to replace advice given to you by your health care provider. Make sure you discuss any questions you have with your health care provider. Document Revised: 03/27/2017 Document Reviewed: 03/27/2017 Elsevier Patient Education  [place].   Ankle Pain The ankle joint holds your body weight and allows you to move around. Ankle pain can occur on either side or the back of one ankle or both ankles. Ankle pain may be sharp and burning or dull and aching. There may be tenderness, stiffness, redness, or warmth around the ankle. Many things can cause ankle pain, including an injury to the area and overuse of the ankle. Follow these instructions at home: Activity  Rest your ankle as told by your health care provider. Avoid any activities that cause ankle pain.  Do not use the injured limb to support your body weight until your health care provider says that you can. Use crutches as told by your health care provider.  Do exercises as told by your health care provider.  Ask your health care provider when it is safe to drive if you have a brace on your ankle. If you have a brace:  Wear the brace as told by your health care provider. Remove it only as told by your health care provider.  Loosen the brace if your toes tingle, become numb, or turn cold and blue.  Keep the brace clean.  If the brace is not waterproof: ? Do not let it get wet. ? Cover it with a watertight covering when you take a  bath or shower. If you were given an elastic bandage:   Remove it when you take a bath or a shower.  Try not to move your ankle very much, but wiggle your toes from time to time. This helps  to prevent swelling.  Adjust the bandage to make it more comfortable if it feels too tight.  Loosen the bandage if you have numbness or tingling in your foot or if your foot turns cold and blue. Managing pain, stiffness, and swelling   If directed, put ice on the painful area. ? If you have a removable brace or elastic bandage, remove it as told by your health care provider. ? Put ice in a plastic bag. ? Place a towel between your skin and the bag. ? Leave the ice on for 20 minutes, 2-3 times a day.  Move your toes often to avoid stiffness and to lessen swelling.  Raise (elevate) your ankle above the level of your heart while you are sitting or lying down. General instructions  Record information about your pain. Writing down the following may be helpful for you and your health care provider: ? How often you have ankle pain. ? Where the pain is located. ? What the pain feels like.  If treatment involves wearing a prescribed shoe or insole, make sure you wear it correctly and for as long as told by your health care provider.  Take over-the-counter and prescription medicines only as told by your health care provider.  Keep all follow-up visits as told by your health care provider. This is important. Contact a health care provider if:  Your pain gets worse.  Your pain is not relieved with medicines.  You have a fever or chills.  You are having more trouble with walking.  You have new symptoms. Get help right away if:  Your foot, leg, toes, or ankle: ? Tingles or becomes numb. ? Becomes swollen. ? Turns pale or blue. Summary  Ankle pain can occur on either side or the back of one ankle or both ankles.  Ankle pain may be sharp and burning or dull and aching.  Rest your ankle as  told by your health care provider. If told, apply ice to the area.  Take over-the-counter and prescription medicines only as told by your health care provider. This information is not intended to replace advice given to you by your health care provider. Make sure you discuss any questions you have with your health care provider. Document Revised: 07/31/2018 Document Reviewed: 10/18/2017 Elsevier Patient Education  [place].   Hyperkalemia Hyperkalemia is when you have too much potassium in your blood. Potassium helps your body in many ways, but having too much can cause problems. If there is too much potassium in your blood, it can affect how your heart works. Potassium is normally removed from your body by your kidneys. Many things can cause the amount in your blood to be high. Medicines and other treatments can be used to bring the amount to a normal level. Treatment may need to be done in the hospital. Follow these instructions at home:   Take over-the-counter and prescription medicines only as told by your doctor.  Do not take any of the following unless your doctor says it is okay: ? Supplements. ? Natural products. ? Herbs. ? Vitamins.  Limit how much alcohol you drink as told by your doctor.  Do not use drugs. If you need help quitting, ask your doctor.  If you have kidney disease, you may need to follow a low-potassium diet. A food specialist (dietitian) can help you.  Keep all follow-up visits as told by your doctor. This is important. Contact a doctor if:  Your heartbeat is not regular or is  very slow.  You feel dizzy (light-headed).  You feel weak.  You feel sick to your stomach (nauseous).  You have tingling in your hands or feet.  You lose feeling (have numbness) in your hands or feet. Get help right away if:  You are short of breath.  You have chest pain.  You pass out (faint).  You cannot move your muscles. Summary  Hyperkalemia is when  you have too much potassium in your blood.  Take over-the-counter and prescription medicines only as told by your doctor.  Limit how much alcohol you drink as told by your doctor.  Contact a doctor if your heartbeat is not regular. This information is not intended to replace advice given to you by your health care provider. Make sure you discuss any questions you have with your health care provider. Document Revised: 03/27/2017 Document Reviewed: 03/27/2017 Elsevier Patient Education  [place].   Joint Pain  Joint pain can be caused by many things. It is likely to go away if you follow instructions from your doctor for taking care of yourself at home. Sometimes, you may need more treatment. Follow these instructions at home: Managing pain, stiffness, and swelling   If told, put ice on the painful area. ? Put ice in a plastic bag. ? Place a towel between your skin and the bag. ? Leave the ice on for 20 minutes, 2-3 times a day.  If told, put heat on the painful area. Do this as often as told by your doctor. Use the heat source that your doctor recommends, such as a moist heat pack or a heating pad. ? Place a towel between your skin and the heat source. ? Leave the heat on for 20-30 minutes. ? Take off the heat if your skin gets bright red. This is especially important if you are unable to feel pain, heat, or cold. You may have a greater risk of getting burned.  Move your fingers or toes below the painful joint often. This helps with stiffness and swelling.  If possible, raise (elevate) the painful joint above the level of your heart while you are sitting or lying down. To do this, try putting a few pillows under the painful joint. Activity  Rest the painful joint for as long as told. Do not do things that cause pain or make your pain worse.  Begin exercising or stretching the affected area, as told by your doctor. Ask your doctor what types of exercise are safe for  you. If you have an elastic bandage, sling, or splint:  Wear the device as told by your doctor. Take it off only as told by your doctor.  Loosen the device if your fingers or toes below the joint: ? Tingle. ? Lose feeling (get numb). ? Get cold and blue.  Keep the device clean.  Ask your doctor if you should take off the device before bathing. You may need to cover it with a watertight covering when you take a bath or a shower. General instructions  Take over-the-counter and prescription medicines only as told by your doctor.  Do not use any products that contain nicotine or tobacco. These include cigarettes and e-cigarettes. If you need help quitting, ask your doctor.  Keep all follow-up visits as told by your doctor. This is important. Contact a doctor if:  You have pain that gets worse and does not get better with medicine.  Your joint pain does not get better in 3 days.  You  have more bruising or swelling.  You have a fever.  You lose 10 lb (4.5 kg) or more without trying. Get help right away if:  You cannot move the joint.  Your fingers or toes tingle, lose feeling, or get cold and blue.  You have a fever along with a joint that is red, warm, and swollen. Summary  Joint pain can be caused by many things. It often goes away if you follow instructions from your doctor for taking care of yourself at home.  Rest the painful joint for as long as told. Do not do things that cause pain or make your pain worse.  Take over-the-counter and prescription medicines only as told by your doctor. This information is not intended to replace advice given to you by your health care provider. Make sure you discuss any questions you have with your health care provider. Document Revised: 03/24/2017 Document Reviewed: 01/25/2017 Elsevier Patient Education  [place].

## 2019-10-23 DIAGNOSIS — I150 Renovascular hypertension: Secondary | ICD-10-CM | POA: Diagnosis not present

## 2019-10-23 DIAGNOSIS — D508 Other iron deficiency anemias: Secondary | ICD-10-CM | POA: Diagnosis not present

## 2019-10-23 DIAGNOSIS — E7849 Other hyperlipidemia: Secondary | ICD-10-CM | POA: Diagnosis not present

## 2019-10-23 DIAGNOSIS — H9193 Unspecified hearing loss, bilateral: Secondary | ICD-10-CM | POA: Diagnosis not present

## 2019-10-23 DIAGNOSIS — H4089 Other specified glaucoma: Secondary | ICD-10-CM | POA: Diagnosis not present

## 2019-10-23 DIAGNOSIS — N178 Other acute kidney failure: Secondary | ICD-10-CM | POA: Diagnosis not present

## 2019-10-23 DIAGNOSIS — F0280 Dementia in other diseases classified elsewhere without behavioral disturbance: Secondary | ICD-10-CM | POA: Diagnosis not present

## 2019-10-23 DIAGNOSIS — M19012 Primary osteoarthritis, left shoulder: Secondary | ICD-10-CM | POA: Diagnosis not present

## 2019-10-23 DIAGNOSIS — U071 COVID-19: Secondary | ICD-10-CM | POA: Diagnosis not present

## 2019-10-23 DIAGNOSIS — M25412 Effusion, left shoulder: Secondary | ICD-10-CM | POA: Diagnosis not present

## 2019-10-23 DIAGNOSIS — D638 Anemia in other chronic diseases classified elsewhere: Secondary | ICD-10-CM | POA: Diagnosis not present

## 2019-10-23 DIAGNOSIS — F8089 Other developmental disorders of speech and language: Secondary | ICD-10-CM | POA: Diagnosis not present

## 2019-10-24 ENCOUNTER — Ambulatory Visit (INDEPENDENT_AMBULATORY_CARE_PROVIDER_SITE_OTHER): Admitting: Podiatry

## 2019-10-24 DIAGNOSIS — Z5329 Procedure and treatment not carried out because of patient's decision for other reasons: Secondary | ICD-10-CM

## 2019-10-24 NOTE — Progress Notes (Signed)
No show for appt. 

## 2019-10-30 DIAGNOSIS — M25412 Effusion, left shoulder: Secondary | ICD-10-CM | POA: Diagnosis not present

## 2019-10-30 DIAGNOSIS — M19011 Primary osteoarthritis, right shoulder: Secondary | ICD-10-CM | POA: Diagnosis not present

## 2019-11-01 DIAGNOSIS — M19012 Primary osteoarthritis, left shoulder: Secondary | ICD-10-CM | POA: Diagnosis not present

## 2019-11-08 ENCOUNTER — Ambulatory Visit (INDEPENDENT_AMBULATORY_CARE_PROVIDER_SITE_OTHER): Payer: Medicare Other | Admitting: Podiatry

## 2019-11-08 ENCOUNTER — Other Ambulatory Visit: Payer: Self-pay

## 2019-11-08 ENCOUNTER — Encounter: Payer: Self-pay | Admitting: Podiatry

## 2019-11-08 DIAGNOSIS — L97521 Non-pressure chronic ulcer of other part of left foot limited to breakdown of skin: Secondary | ICD-10-CM

## 2019-11-08 DIAGNOSIS — L97511 Non-pressure chronic ulcer of other part of right foot limited to breakdown of skin: Secondary | ICD-10-CM

## 2019-11-08 NOTE — Progress Notes (Signed)
This patient 4 weeks ago presents the office for an evaluation and a wound check on the second digits both feet patient was seen by Dr. March Rummage approximately 4 weeks ago.  Dr.  March Rummage told her to return for continued evaluation of ulcers that develop on second toes  both feet.  She presents to the office with her son? for continued evaluation. She presents to the office in her wheelchair. This patient is deaf and presents with an interpreter. Patient has been treated for ulcers back as far as 2017.  This patient is diabetic with CKD.  General Appearance  Alert, conversant and in no acute stress.  Vascular  Dorsalis pedis and posterior tibial  pulses are weakly  palpable  bilaterally.  Capillary return is within normal limits  bilaterally. Temperature is within normal limits  bilaterally.  Neurologic  Senn-Weinstein monofilament wire test diminished   bilaterally. Muscle power within normal limits bilaterally.  Nails Thick disfigured discolored nails with subungual debris  from hallux to fifth toes bilaterally. No evidence of bacterial infection or drainage bilaterally.  Orthopedic  No limitations of motion  feet .  No crepitus or effusions noted.  No bony pathology or digital deformities noted.  HD formation DIPJ second toes  B/L.  Skin  normotropic skin with no porokeratosis noted bilaterally.  No signs of infections or ulcers noted.   Callus and crusty tissue at PIPJ second toe  B/L. No redness or infection or drainage.    S/P ulcer second  B/L.    ROV.  Examined her toes which have healed calluses tissue  At the level of PIPJ second toe  B/L.  Recommended she return to the office for nail care in 6 weeks and I can simultaneously check on her ulcers second toes bilateral.   Gardiner Barefoot DPM

## 2019-11-11 DIAGNOSIS — F0390 Unspecified dementia without behavioral disturbance: Secondary | ICD-10-CM | POA: Diagnosis not present

## 2019-11-11 DIAGNOSIS — R2681 Unsteadiness on feet: Secondary | ICD-10-CM | POA: Diagnosis not present

## 2019-11-11 DIAGNOSIS — L97509 Non-pressure chronic ulcer of other part of unspecified foot with unspecified severity: Secondary | ICD-10-CM | POA: Diagnosis not present

## 2019-11-22 DIAGNOSIS — F325 Major depressive disorder, single episode, in full remission: Secondary | ICD-10-CM | POA: Diagnosis not present

## 2019-11-28 DIAGNOSIS — D508 Other iron deficiency anemias: Secondary | ICD-10-CM | POA: Diagnosis not present

## 2019-11-28 DIAGNOSIS — I150 Renovascular hypertension: Secondary | ICD-10-CM | POA: Diagnosis not present

## 2019-11-28 DIAGNOSIS — M25412 Effusion, left shoulder: Secondary | ICD-10-CM | POA: Diagnosis not present

## 2019-11-28 DIAGNOSIS — N1832 Chronic kidney disease, stage 3b: Secondary | ICD-10-CM | POA: Diagnosis not present

## 2019-11-28 DIAGNOSIS — E7849 Other hyperlipidemia: Secondary | ICD-10-CM | POA: Diagnosis not present

## 2019-11-28 DIAGNOSIS — F325 Major depressive disorder, single episode, in full remission: Secondary | ICD-10-CM | POA: Diagnosis not present

## 2019-11-28 DIAGNOSIS — R2681 Unsteadiness on feet: Secondary | ICD-10-CM | POA: Diagnosis not present

## 2019-12-09 DIAGNOSIS — N1832 Chronic kidney disease, stage 3b: Secondary | ICD-10-CM | POA: Diagnosis not present

## 2019-12-09 DIAGNOSIS — D538 Other specified nutritional anemias: Secondary | ICD-10-CM | POA: Diagnosis not present

## 2019-12-09 DIAGNOSIS — E870 Hyperosmolality and hypernatremia: Secondary | ICD-10-CM | POA: Diagnosis not present

## 2019-12-09 DIAGNOSIS — G248 Other dystonia: Secondary | ICD-10-CM | POA: Diagnosis not present

## 2019-12-16 DIAGNOSIS — R198 Other specified symptoms and signs involving the digestive system and abdomen: Secondary | ICD-10-CM | POA: Diagnosis not present

## 2019-12-16 DIAGNOSIS — D538 Other specified nutritional anemias: Secondary | ICD-10-CM | POA: Diagnosis not present

## 2020-01-03 ENCOUNTER — Ambulatory Visit: Payer: Medicare Other | Admitting: Podiatry

## 2020-01-13 DIAGNOSIS — F0280 Dementia in other diseases classified elsewhere without behavioral disturbance: Secondary | ICD-10-CM | POA: Diagnosis not present

## 2020-01-13 DIAGNOSIS — D509 Iron deficiency anemia, unspecified: Secondary | ICD-10-CM | POA: Diagnosis not present

## 2020-01-13 DIAGNOSIS — H9193 Unspecified hearing loss, bilateral: Secondary | ICD-10-CM | POA: Diagnosis not present

## 2020-01-13 DIAGNOSIS — E7849 Other hyperlipidemia: Secondary | ICD-10-CM | POA: Diagnosis not present

## 2020-01-13 DIAGNOSIS — E119 Type 2 diabetes mellitus without complications: Secondary | ICD-10-CM | POA: Diagnosis not present

## 2020-01-13 DIAGNOSIS — M19012 Primary osteoarthritis, left shoulder: Secondary | ICD-10-CM | POA: Diagnosis not present

## 2020-01-13 DIAGNOSIS — H4089 Other specified glaucoma: Secondary | ICD-10-CM | POA: Diagnosis not present

## 2020-01-13 DIAGNOSIS — Z8616 Personal history of COVID-19: Secondary | ICD-10-CM | POA: Diagnosis not present

## 2020-01-13 DIAGNOSIS — I150 Renovascular hypertension: Secondary | ICD-10-CM | POA: Diagnosis not present

## 2020-01-13 DIAGNOSIS — F39 Unspecified mood [affective] disorder: Secondary | ICD-10-CM | POA: Diagnosis not present

## 2020-01-13 DIAGNOSIS — N189 Chronic kidney disease, unspecified: Secondary | ICD-10-CM | POA: Diagnosis not present

## 2020-01-13 DIAGNOSIS — E871 Hypo-osmolality and hyponatremia: Secondary | ICD-10-CM | POA: Diagnosis not present

## 2020-01-14 DIAGNOSIS — N184 Chronic kidney disease, stage 4 (severe): Secondary | ICD-10-CM | POA: Diagnosis not present

## 2020-01-14 DIAGNOSIS — R41841 Cognitive communication deficit: Secondary | ICD-10-CM | POA: Diagnosis not present

## 2020-01-14 DIAGNOSIS — F0390 Unspecified dementia without behavioral disturbance: Secondary | ICD-10-CM | POA: Diagnosis not present

## 2020-01-15 DIAGNOSIS — R41841 Cognitive communication deficit: Secondary | ICD-10-CM | POA: Diagnosis not present

## 2020-01-15 DIAGNOSIS — N184 Chronic kidney disease, stage 4 (severe): Secondary | ICD-10-CM | POA: Diagnosis not present

## 2020-01-15 DIAGNOSIS — F0390 Unspecified dementia without behavioral disturbance: Secondary | ICD-10-CM | POA: Diagnosis not present

## 2020-01-16 DIAGNOSIS — N184 Chronic kidney disease, stage 4 (severe): Secondary | ICD-10-CM | POA: Diagnosis not present

## 2020-01-16 DIAGNOSIS — F0390 Unspecified dementia without behavioral disturbance: Secondary | ICD-10-CM | POA: Diagnosis not present

## 2020-01-16 DIAGNOSIS — R41841 Cognitive communication deficit: Secondary | ICD-10-CM | POA: Diagnosis not present

## 2020-01-17 DIAGNOSIS — N184 Chronic kidney disease, stage 4 (severe): Secondary | ICD-10-CM | POA: Diagnosis not present

## 2020-01-17 DIAGNOSIS — F0390 Unspecified dementia without behavioral disturbance: Secondary | ICD-10-CM | POA: Diagnosis not present

## 2020-01-17 DIAGNOSIS — R41841 Cognitive communication deficit: Secondary | ICD-10-CM | POA: Diagnosis not present

## 2020-01-20 DIAGNOSIS — N184 Chronic kidney disease, stage 4 (severe): Secondary | ICD-10-CM | POA: Diagnosis not present

## 2020-01-20 DIAGNOSIS — R41841 Cognitive communication deficit: Secondary | ICD-10-CM | POA: Diagnosis not present

## 2020-01-20 DIAGNOSIS — F0390 Unspecified dementia without behavioral disturbance: Secondary | ICD-10-CM | POA: Diagnosis not present

## 2020-01-21 DIAGNOSIS — R41841 Cognitive communication deficit: Secondary | ICD-10-CM | POA: Diagnosis not present

## 2020-01-21 DIAGNOSIS — F0390 Unspecified dementia without behavioral disturbance: Secondary | ICD-10-CM | POA: Diagnosis not present

## 2020-01-21 DIAGNOSIS — N184 Chronic kidney disease, stage 4 (severe): Secondary | ICD-10-CM | POA: Diagnosis not present

## 2020-01-22 DIAGNOSIS — N184 Chronic kidney disease, stage 4 (severe): Secondary | ICD-10-CM | POA: Diagnosis not present

## 2020-01-22 DIAGNOSIS — F0390 Unspecified dementia without behavioral disturbance: Secondary | ICD-10-CM | POA: Diagnosis not present

## 2020-01-22 DIAGNOSIS — R41841 Cognitive communication deficit: Secondary | ICD-10-CM | POA: Diagnosis not present

## 2020-01-23 DIAGNOSIS — Z20822 Contact with and (suspected) exposure to covid-19: Secondary | ICD-10-CM | POA: Diagnosis not present

## 2020-01-23 DIAGNOSIS — F0390 Unspecified dementia without behavioral disturbance: Secondary | ICD-10-CM | POA: Diagnosis not present

## 2020-01-23 DIAGNOSIS — N184 Chronic kidney disease, stage 4 (severe): Secondary | ICD-10-CM | POA: Diagnosis not present

## 2020-01-23 DIAGNOSIS — R41841 Cognitive communication deficit: Secondary | ICD-10-CM | POA: Diagnosis not present

## 2020-01-24 DIAGNOSIS — R293 Abnormal posture: Secondary | ICD-10-CM | POA: Diagnosis not present

## 2020-01-24 DIAGNOSIS — R41841 Cognitive communication deficit: Secondary | ICD-10-CM | POA: Diagnosis not present

## 2020-01-24 DIAGNOSIS — R296 Repeated falls: Secondary | ICD-10-CM | POA: Diagnosis not present

## 2020-01-24 DIAGNOSIS — F0390 Unspecified dementia without behavioral disturbance: Secondary | ICD-10-CM | POA: Diagnosis not present

## 2020-01-24 DIAGNOSIS — Z8616 Personal history of COVID-19: Secondary | ICD-10-CM | POA: Diagnosis not present

## 2020-01-24 DIAGNOSIS — R278 Other lack of coordination: Secondary | ICD-10-CM | POA: Diagnosis not present

## 2020-01-27 DIAGNOSIS — R293 Abnormal posture: Secondary | ICD-10-CM | POA: Diagnosis not present

## 2020-01-27 DIAGNOSIS — R296 Repeated falls: Secondary | ICD-10-CM | POA: Diagnosis not present

## 2020-01-27 DIAGNOSIS — Z23 Encounter for immunization: Secondary | ICD-10-CM | POA: Diagnosis not present

## 2020-01-27 DIAGNOSIS — F0390 Unspecified dementia without behavioral disturbance: Secondary | ICD-10-CM | POA: Diagnosis not present

## 2020-01-27 DIAGNOSIS — R278 Other lack of coordination: Secondary | ICD-10-CM | POA: Diagnosis not present

## 2020-01-27 DIAGNOSIS — Z8616 Personal history of COVID-19: Secondary | ICD-10-CM | POA: Diagnosis not present

## 2020-01-27 DIAGNOSIS — R41841 Cognitive communication deficit: Secondary | ICD-10-CM | POA: Diagnosis not present

## 2020-01-30 DIAGNOSIS — Z20822 Contact with and (suspected) exposure to covid-19: Secondary | ICD-10-CM | POA: Diagnosis not present

## 2020-02-03 DIAGNOSIS — F3489 Other specified persistent mood disorders: Secondary | ICD-10-CM | POA: Diagnosis not present

## 2020-02-03 DIAGNOSIS — F0280 Dementia in other diseases classified elsewhere without behavioral disturbance: Secondary | ICD-10-CM | POA: Diagnosis not present

## 2020-02-03 DIAGNOSIS — G2589 Other specified extrapyramidal and movement disorders: Secondary | ICD-10-CM | POA: Diagnosis not present

## 2020-02-06 DIAGNOSIS — R278 Other lack of coordination: Secondary | ICD-10-CM | POA: Diagnosis not present

## 2020-02-06 DIAGNOSIS — Z8616 Personal history of COVID-19: Secondary | ICD-10-CM | POA: Diagnosis not present

## 2020-02-06 DIAGNOSIS — Z20822 Contact with and (suspected) exposure to covid-19: Secondary | ICD-10-CM | POA: Diagnosis not present

## 2020-02-06 DIAGNOSIS — R41841 Cognitive communication deficit: Secondary | ICD-10-CM | POA: Diagnosis not present

## 2020-02-06 DIAGNOSIS — R296 Repeated falls: Secondary | ICD-10-CM | POA: Diagnosis not present

## 2020-02-06 DIAGNOSIS — R293 Abnormal posture: Secondary | ICD-10-CM | POA: Diagnosis not present

## 2020-02-06 DIAGNOSIS — F0390 Unspecified dementia without behavioral disturbance: Secondary | ICD-10-CM | POA: Diagnosis not present

## 2020-02-07 DIAGNOSIS — R296 Repeated falls: Secondary | ICD-10-CM | POA: Diagnosis not present

## 2020-02-07 DIAGNOSIS — R278 Other lack of coordination: Secondary | ICD-10-CM | POA: Diagnosis not present

## 2020-02-07 DIAGNOSIS — F0390 Unspecified dementia without behavioral disturbance: Secondary | ICD-10-CM | POA: Diagnosis not present

## 2020-02-07 DIAGNOSIS — R293 Abnormal posture: Secondary | ICD-10-CM | POA: Diagnosis not present

## 2020-02-07 DIAGNOSIS — Z8616 Personal history of COVID-19: Secondary | ICD-10-CM | POA: Diagnosis not present

## 2020-02-07 DIAGNOSIS — R41841 Cognitive communication deficit: Secondary | ICD-10-CM | POA: Diagnosis not present

## 2020-02-10 DIAGNOSIS — R278 Other lack of coordination: Secondary | ICD-10-CM | POA: Diagnosis not present

## 2020-02-10 DIAGNOSIS — R41841 Cognitive communication deficit: Secondary | ICD-10-CM | POA: Diagnosis not present

## 2020-02-10 DIAGNOSIS — Z20822 Contact with and (suspected) exposure to covid-19: Secondary | ICD-10-CM | POA: Diagnosis not present

## 2020-02-10 DIAGNOSIS — R293 Abnormal posture: Secondary | ICD-10-CM | POA: Diagnosis not present

## 2020-02-10 DIAGNOSIS — F0390 Unspecified dementia without behavioral disturbance: Secondary | ICD-10-CM | POA: Diagnosis not present

## 2020-02-10 DIAGNOSIS — Z8616 Personal history of COVID-19: Secondary | ICD-10-CM | POA: Diagnosis not present

## 2020-02-10 DIAGNOSIS — R296 Repeated falls: Secondary | ICD-10-CM | POA: Diagnosis not present

## 2020-02-11 DIAGNOSIS — Z8616 Personal history of COVID-19: Secondary | ICD-10-CM | POA: Diagnosis not present

## 2020-02-11 DIAGNOSIS — R293 Abnormal posture: Secondary | ICD-10-CM | POA: Diagnosis not present

## 2020-02-11 DIAGNOSIS — R41841 Cognitive communication deficit: Secondary | ICD-10-CM | POA: Diagnosis not present

## 2020-02-11 DIAGNOSIS — F0390 Unspecified dementia without behavioral disturbance: Secondary | ICD-10-CM | POA: Diagnosis not present

## 2020-02-11 DIAGNOSIS — R278 Other lack of coordination: Secondary | ICD-10-CM | POA: Diagnosis not present

## 2020-02-11 DIAGNOSIS — R296 Repeated falls: Secondary | ICD-10-CM | POA: Diagnosis not present

## 2020-02-12 DIAGNOSIS — R278 Other lack of coordination: Secondary | ICD-10-CM | POA: Diagnosis not present

## 2020-02-12 DIAGNOSIS — R296 Repeated falls: Secondary | ICD-10-CM | POA: Diagnosis not present

## 2020-02-12 DIAGNOSIS — R41841 Cognitive communication deficit: Secondary | ICD-10-CM | POA: Diagnosis not present

## 2020-02-12 DIAGNOSIS — Z8616 Personal history of COVID-19: Secondary | ICD-10-CM | POA: Diagnosis not present

## 2020-02-12 DIAGNOSIS — R293 Abnormal posture: Secondary | ICD-10-CM | POA: Diagnosis not present

## 2020-02-12 DIAGNOSIS — F0390 Unspecified dementia without behavioral disturbance: Secondary | ICD-10-CM | POA: Diagnosis not present

## 2020-02-13 DIAGNOSIS — R296 Repeated falls: Secondary | ICD-10-CM | POA: Diagnosis not present

## 2020-02-13 DIAGNOSIS — R293 Abnormal posture: Secondary | ICD-10-CM | POA: Diagnosis not present

## 2020-02-13 DIAGNOSIS — F0390 Unspecified dementia without behavioral disturbance: Secondary | ICD-10-CM | POA: Diagnosis not present

## 2020-02-13 DIAGNOSIS — Z8616 Personal history of COVID-19: Secondary | ICD-10-CM | POA: Diagnosis not present

## 2020-02-13 DIAGNOSIS — R41841 Cognitive communication deficit: Secondary | ICD-10-CM | POA: Diagnosis not present

## 2020-02-13 DIAGNOSIS — R278 Other lack of coordination: Secondary | ICD-10-CM | POA: Diagnosis not present

## 2020-02-14 DIAGNOSIS — F0390 Unspecified dementia without behavioral disturbance: Secondary | ICD-10-CM | POA: Diagnosis not present

## 2020-02-14 DIAGNOSIS — R278 Other lack of coordination: Secondary | ICD-10-CM | POA: Diagnosis not present

## 2020-02-14 DIAGNOSIS — R293 Abnormal posture: Secondary | ICD-10-CM | POA: Diagnosis not present

## 2020-02-14 DIAGNOSIS — D518 Other vitamin B12 deficiency anemias: Secondary | ICD-10-CM | POA: Diagnosis not present

## 2020-02-14 DIAGNOSIS — R296 Repeated falls: Secondary | ICD-10-CM | POA: Diagnosis not present

## 2020-02-14 DIAGNOSIS — E785 Hyperlipidemia, unspecified: Secondary | ICD-10-CM | POA: Diagnosis not present

## 2020-02-14 DIAGNOSIS — N184 Chronic kidney disease, stage 4 (severe): Secondary | ICD-10-CM | POA: Diagnosis not present

## 2020-02-14 DIAGNOSIS — Z8616 Personal history of COVID-19: Secondary | ICD-10-CM | POA: Diagnosis not present

## 2020-02-14 DIAGNOSIS — R41841 Cognitive communication deficit: Secondary | ICD-10-CM | POA: Diagnosis not present

## 2020-02-17 DIAGNOSIS — R296 Repeated falls: Secondary | ICD-10-CM | POA: Diagnosis not present

## 2020-02-17 DIAGNOSIS — F39 Unspecified mood [affective] disorder: Secondary | ICD-10-CM | POA: Diagnosis not present

## 2020-02-17 DIAGNOSIS — R278 Other lack of coordination: Secondary | ICD-10-CM | POA: Diagnosis not present

## 2020-02-17 DIAGNOSIS — R293 Abnormal posture: Secondary | ICD-10-CM | POA: Diagnosis not present

## 2020-02-17 DIAGNOSIS — Z20822 Contact with and (suspected) exposure to covid-19: Secondary | ICD-10-CM | POA: Diagnosis not present

## 2020-02-17 DIAGNOSIS — E7849 Other hyperlipidemia: Secondary | ICD-10-CM | POA: Diagnosis not present

## 2020-02-17 DIAGNOSIS — D538 Other specified nutritional anemias: Secondary | ICD-10-CM | POA: Diagnosis not present

## 2020-02-17 DIAGNOSIS — F0390 Unspecified dementia without behavioral disturbance: Secondary | ICD-10-CM | POA: Diagnosis not present

## 2020-02-17 DIAGNOSIS — R41841 Cognitive communication deficit: Secondary | ICD-10-CM | POA: Diagnosis not present

## 2020-02-17 DIAGNOSIS — Z8616 Personal history of COVID-19: Secondary | ICD-10-CM | POA: Diagnosis not present

## 2020-02-17 DIAGNOSIS — F0280 Dementia in other diseases classified elsewhere without behavioral disturbance: Secondary | ICD-10-CM | POA: Diagnosis not present

## 2020-02-17 DIAGNOSIS — H9190 Unspecified hearing loss, unspecified ear: Secondary | ICD-10-CM | POA: Diagnosis not present

## 2020-02-17 DIAGNOSIS — I150 Renovascular hypertension: Secondary | ICD-10-CM | POA: Diagnosis not present

## 2020-02-18 DIAGNOSIS — R296 Repeated falls: Secondary | ICD-10-CM | POA: Diagnosis not present

## 2020-02-18 DIAGNOSIS — F0390 Unspecified dementia without behavioral disturbance: Secondary | ICD-10-CM | POA: Diagnosis not present

## 2020-02-18 DIAGNOSIS — R41841 Cognitive communication deficit: Secondary | ICD-10-CM | POA: Diagnosis not present

## 2020-02-18 DIAGNOSIS — Z8616 Personal history of COVID-19: Secondary | ICD-10-CM | POA: Diagnosis not present

## 2020-02-18 DIAGNOSIS — R278 Other lack of coordination: Secondary | ICD-10-CM | POA: Diagnosis not present

## 2020-02-18 DIAGNOSIS — R293 Abnormal posture: Secondary | ICD-10-CM | POA: Diagnosis not present

## 2020-02-19 DIAGNOSIS — R278 Other lack of coordination: Secondary | ICD-10-CM | POA: Diagnosis not present

## 2020-02-19 DIAGNOSIS — Z8616 Personal history of COVID-19: Secondary | ICD-10-CM | POA: Diagnosis not present

## 2020-02-19 DIAGNOSIS — F0390 Unspecified dementia without behavioral disturbance: Secondary | ICD-10-CM | POA: Diagnosis not present

## 2020-02-19 DIAGNOSIS — R41841 Cognitive communication deficit: Secondary | ICD-10-CM | POA: Diagnosis not present

## 2020-02-19 DIAGNOSIS — R296 Repeated falls: Secondary | ICD-10-CM | POA: Diagnosis not present

## 2020-02-19 DIAGNOSIS — R293 Abnormal posture: Secondary | ICD-10-CM | POA: Diagnosis not present

## 2020-02-20 DIAGNOSIS — R278 Other lack of coordination: Secondary | ICD-10-CM | POA: Diagnosis not present

## 2020-02-20 DIAGNOSIS — Z8616 Personal history of COVID-19: Secondary | ICD-10-CM | POA: Diagnosis not present

## 2020-02-20 DIAGNOSIS — F0390 Unspecified dementia without behavioral disturbance: Secondary | ICD-10-CM | POA: Diagnosis not present

## 2020-02-20 DIAGNOSIS — R293 Abnormal posture: Secondary | ICD-10-CM | POA: Diagnosis not present

## 2020-02-20 DIAGNOSIS — R296 Repeated falls: Secondary | ICD-10-CM | POA: Diagnosis not present

## 2020-02-20 DIAGNOSIS — R41841 Cognitive communication deficit: Secondary | ICD-10-CM | POA: Diagnosis not present

## 2020-02-21 DIAGNOSIS — F0390 Unspecified dementia without behavioral disturbance: Secondary | ICD-10-CM | POA: Diagnosis not present

## 2020-02-21 DIAGNOSIS — R296 Repeated falls: Secondary | ICD-10-CM | POA: Diagnosis not present

## 2020-02-21 DIAGNOSIS — R278 Other lack of coordination: Secondary | ICD-10-CM | POA: Diagnosis not present

## 2020-02-21 DIAGNOSIS — R293 Abnormal posture: Secondary | ICD-10-CM | POA: Diagnosis not present

## 2020-02-21 DIAGNOSIS — Z8616 Personal history of COVID-19: Secondary | ICD-10-CM | POA: Diagnosis not present

## 2020-02-21 DIAGNOSIS — R41841 Cognitive communication deficit: Secondary | ICD-10-CM | POA: Diagnosis not present

## 2020-02-24 DIAGNOSIS — R296 Repeated falls: Secondary | ICD-10-CM | POA: Diagnosis not present

## 2020-02-24 DIAGNOSIS — F0390 Unspecified dementia without behavioral disturbance: Secondary | ICD-10-CM | POA: Diagnosis not present

## 2020-02-24 DIAGNOSIS — R278 Other lack of coordination: Secondary | ICD-10-CM | POA: Diagnosis not present

## 2020-02-24 DIAGNOSIS — R293 Abnormal posture: Secondary | ICD-10-CM | POA: Diagnosis not present

## 2020-02-25 DIAGNOSIS — R293 Abnormal posture: Secondary | ICD-10-CM | POA: Diagnosis not present

## 2020-02-25 DIAGNOSIS — F0390 Unspecified dementia without behavioral disturbance: Secondary | ICD-10-CM | POA: Diagnosis not present

## 2020-02-25 DIAGNOSIS — R278 Other lack of coordination: Secondary | ICD-10-CM | POA: Diagnosis not present

## 2020-02-25 DIAGNOSIS — R296 Repeated falls: Secondary | ICD-10-CM | POA: Diagnosis not present

## 2020-02-26 DIAGNOSIS — R296 Repeated falls: Secondary | ICD-10-CM | POA: Diagnosis not present

## 2020-02-26 DIAGNOSIS — F0390 Unspecified dementia without behavioral disturbance: Secondary | ICD-10-CM | POA: Diagnosis not present

## 2020-02-26 DIAGNOSIS — R293 Abnormal posture: Secondary | ICD-10-CM | POA: Diagnosis not present

## 2020-02-26 DIAGNOSIS — R278 Other lack of coordination: Secondary | ICD-10-CM | POA: Diagnosis not present

## 2020-02-27 DIAGNOSIS — R278 Other lack of coordination: Secondary | ICD-10-CM | POA: Diagnosis not present

## 2020-02-27 DIAGNOSIS — R296 Repeated falls: Secondary | ICD-10-CM | POA: Diagnosis not present

## 2020-02-27 DIAGNOSIS — R293 Abnormal posture: Secondary | ICD-10-CM | POA: Diagnosis not present

## 2020-02-27 DIAGNOSIS — F0390 Unspecified dementia without behavioral disturbance: Secondary | ICD-10-CM | POA: Diagnosis not present

## 2020-02-28 DIAGNOSIS — R278 Other lack of coordination: Secondary | ICD-10-CM | POA: Diagnosis not present

## 2020-02-28 DIAGNOSIS — F0390 Unspecified dementia without behavioral disturbance: Secondary | ICD-10-CM | POA: Diagnosis not present

## 2020-02-28 DIAGNOSIS — R296 Repeated falls: Secondary | ICD-10-CM | POA: Diagnosis not present

## 2020-02-28 DIAGNOSIS — R293 Abnormal posture: Secondary | ICD-10-CM | POA: Diagnosis not present

## 2020-03-02 DIAGNOSIS — Z20822 Contact with and (suspected) exposure to covid-19: Secondary | ICD-10-CM | POA: Diagnosis not present

## 2020-03-02 DIAGNOSIS — R278 Other lack of coordination: Secondary | ICD-10-CM | POA: Diagnosis not present

## 2020-03-02 DIAGNOSIS — R293 Abnormal posture: Secondary | ICD-10-CM | POA: Diagnosis not present

## 2020-03-02 DIAGNOSIS — F0281 Dementia in other diseases classified elsewhere with behavioral disturbance: Secondary | ICD-10-CM | POA: Diagnosis not present

## 2020-03-02 DIAGNOSIS — F0390 Unspecified dementia without behavioral disturbance: Secondary | ICD-10-CM | POA: Diagnosis not present

## 2020-03-02 DIAGNOSIS — S0011XA Contusion of right eyelid and periocular area, initial encounter: Secondary | ICD-10-CM | POA: Diagnosis not present

## 2020-03-02 DIAGNOSIS — R296 Repeated falls: Secondary | ICD-10-CM | POA: Diagnosis not present

## 2020-03-03 DIAGNOSIS — F0390 Unspecified dementia without behavioral disturbance: Secondary | ICD-10-CM | POA: Diagnosis not present

## 2020-03-03 DIAGNOSIS — R293 Abnormal posture: Secondary | ICD-10-CM | POA: Diagnosis not present

## 2020-03-03 DIAGNOSIS — R278 Other lack of coordination: Secondary | ICD-10-CM | POA: Diagnosis not present

## 2020-03-03 DIAGNOSIS — R296 Repeated falls: Secondary | ICD-10-CM | POA: Diagnosis not present

## 2020-03-04 DIAGNOSIS — R293 Abnormal posture: Secondary | ICD-10-CM | POA: Diagnosis not present

## 2020-03-04 DIAGNOSIS — R278 Other lack of coordination: Secondary | ICD-10-CM | POA: Diagnosis not present

## 2020-03-04 DIAGNOSIS — R296 Repeated falls: Secondary | ICD-10-CM | POA: Diagnosis not present

## 2020-03-04 DIAGNOSIS — F0390 Unspecified dementia without behavioral disturbance: Secondary | ICD-10-CM | POA: Diagnosis not present

## 2020-03-05 DIAGNOSIS — F0390 Unspecified dementia without behavioral disturbance: Secondary | ICD-10-CM | POA: Diagnosis not present

## 2020-03-05 DIAGNOSIS — R278 Other lack of coordination: Secondary | ICD-10-CM | POA: Diagnosis not present

## 2020-03-05 DIAGNOSIS — R293 Abnormal posture: Secondary | ICD-10-CM | POA: Diagnosis not present

## 2020-03-05 DIAGNOSIS — R296 Repeated falls: Secondary | ICD-10-CM | POA: Diagnosis not present

## 2020-03-06 DIAGNOSIS — R278 Other lack of coordination: Secondary | ICD-10-CM | POA: Diagnosis not present

## 2020-03-06 DIAGNOSIS — R296 Repeated falls: Secondary | ICD-10-CM | POA: Diagnosis not present

## 2020-03-06 DIAGNOSIS — R293 Abnormal posture: Secondary | ICD-10-CM | POA: Diagnosis not present

## 2020-03-06 DIAGNOSIS — F0390 Unspecified dementia without behavioral disturbance: Secondary | ICD-10-CM | POA: Diagnosis not present

## 2020-03-09 DIAGNOSIS — F418 Other specified anxiety disorders: Secondary | ICD-10-CM | POA: Diagnosis not present

## 2020-03-09 DIAGNOSIS — F0281 Dementia in other diseases classified elsewhere with behavioral disturbance: Secondary | ICD-10-CM | POA: Diagnosis not present

## 2020-03-09 DIAGNOSIS — R296 Repeated falls: Secondary | ICD-10-CM | POA: Diagnosis not present

## 2020-03-09 DIAGNOSIS — F0390 Unspecified dementia without behavioral disturbance: Secondary | ICD-10-CM | POA: Diagnosis not present

## 2020-03-09 DIAGNOSIS — R278 Other lack of coordination: Secondary | ICD-10-CM | POA: Diagnosis not present

## 2020-03-09 DIAGNOSIS — R293 Abnormal posture: Secondary | ICD-10-CM | POA: Diagnosis not present

## 2020-03-10 DIAGNOSIS — R293 Abnormal posture: Secondary | ICD-10-CM | POA: Diagnosis not present

## 2020-03-10 DIAGNOSIS — R278 Other lack of coordination: Secondary | ICD-10-CM | POA: Diagnosis not present

## 2020-03-10 DIAGNOSIS — F0390 Unspecified dementia without behavioral disturbance: Secondary | ICD-10-CM | POA: Diagnosis not present

## 2020-03-10 DIAGNOSIS — R296 Repeated falls: Secondary | ICD-10-CM | POA: Diagnosis not present

## 2020-03-11 DIAGNOSIS — R296 Repeated falls: Secondary | ICD-10-CM | POA: Diagnosis not present

## 2020-03-11 DIAGNOSIS — R293 Abnormal posture: Secondary | ICD-10-CM | POA: Diagnosis not present

## 2020-03-11 DIAGNOSIS — F0390 Unspecified dementia without behavioral disturbance: Secondary | ICD-10-CM | POA: Diagnosis not present

## 2020-03-11 DIAGNOSIS — R278 Other lack of coordination: Secondary | ICD-10-CM | POA: Diagnosis not present

## 2020-03-12 DIAGNOSIS — R293 Abnormal posture: Secondary | ICD-10-CM | POA: Diagnosis not present

## 2020-03-12 DIAGNOSIS — Z23 Encounter for immunization: Secondary | ICD-10-CM | POA: Diagnosis not present

## 2020-03-12 DIAGNOSIS — F0390 Unspecified dementia without behavioral disturbance: Secondary | ICD-10-CM | POA: Diagnosis not present

## 2020-03-12 DIAGNOSIS — R278 Other lack of coordination: Secondary | ICD-10-CM | POA: Diagnosis not present

## 2020-03-12 DIAGNOSIS — R296 Repeated falls: Secondary | ICD-10-CM | POA: Diagnosis not present

## 2020-03-13 DIAGNOSIS — R278 Other lack of coordination: Secondary | ICD-10-CM | POA: Diagnosis not present

## 2020-03-13 DIAGNOSIS — R296 Repeated falls: Secondary | ICD-10-CM | POA: Diagnosis not present

## 2020-03-13 DIAGNOSIS — R293 Abnormal posture: Secondary | ICD-10-CM | POA: Diagnosis not present

## 2020-03-13 DIAGNOSIS — F0390 Unspecified dementia without behavioral disturbance: Secondary | ICD-10-CM | POA: Diagnosis not present

## 2020-03-16 DIAGNOSIS — R293 Abnormal posture: Secondary | ICD-10-CM | POA: Diagnosis not present

## 2020-03-16 DIAGNOSIS — R278 Other lack of coordination: Secondary | ICD-10-CM | POA: Diagnosis not present

## 2020-03-16 DIAGNOSIS — R296 Repeated falls: Secondary | ICD-10-CM | POA: Diagnosis not present

## 2020-03-16 DIAGNOSIS — F0390 Unspecified dementia without behavioral disturbance: Secondary | ICD-10-CM | POA: Diagnosis not present

## 2020-03-17 DIAGNOSIS — R296 Repeated falls: Secondary | ICD-10-CM | POA: Diagnosis not present

## 2020-03-17 DIAGNOSIS — R278 Other lack of coordination: Secondary | ICD-10-CM | POA: Diagnosis not present

## 2020-03-17 DIAGNOSIS — F0390 Unspecified dementia without behavioral disturbance: Secondary | ICD-10-CM | POA: Diagnosis not present

## 2020-03-17 DIAGNOSIS — R293 Abnormal posture: Secondary | ICD-10-CM | POA: Diagnosis not present

## 2020-03-18 DIAGNOSIS — R296 Repeated falls: Secondary | ICD-10-CM | POA: Diagnosis not present

## 2020-03-18 DIAGNOSIS — R293 Abnormal posture: Secondary | ICD-10-CM | POA: Diagnosis not present

## 2020-03-18 DIAGNOSIS — R278 Other lack of coordination: Secondary | ICD-10-CM | POA: Diagnosis not present

## 2020-03-18 DIAGNOSIS — F0390 Unspecified dementia without behavioral disturbance: Secondary | ICD-10-CM | POA: Diagnosis not present

## 2020-03-19 DIAGNOSIS — R293 Abnormal posture: Secondary | ICD-10-CM | POA: Diagnosis not present

## 2020-03-19 DIAGNOSIS — F0390 Unspecified dementia without behavioral disturbance: Secondary | ICD-10-CM | POA: Diagnosis not present

## 2020-03-19 DIAGNOSIS — R278 Other lack of coordination: Secondary | ICD-10-CM | POA: Diagnosis not present

## 2020-03-19 DIAGNOSIS — R296 Repeated falls: Secondary | ICD-10-CM | POA: Diagnosis not present

## 2020-03-20 DIAGNOSIS — R278 Other lack of coordination: Secondary | ICD-10-CM | POA: Diagnosis not present

## 2020-03-20 DIAGNOSIS — R293 Abnormal posture: Secondary | ICD-10-CM | POA: Diagnosis not present

## 2020-03-20 DIAGNOSIS — R296 Repeated falls: Secondary | ICD-10-CM | POA: Diagnosis not present

## 2020-03-20 DIAGNOSIS — F0390 Unspecified dementia without behavioral disturbance: Secondary | ICD-10-CM | POA: Diagnosis not present

## 2020-03-23 DIAGNOSIS — F0390 Unspecified dementia without behavioral disturbance: Secondary | ICD-10-CM | POA: Diagnosis not present

## 2020-03-23 DIAGNOSIS — R296 Repeated falls: Secondary | ICD-10-CM | POA: Diagnosis not present

## 2020-03-23 DIAGNOSIS — R293 Abnormal posture: Secondary | ICD-10-CM | POA: Diagnosis not present

## 2020-03-23 DIAGNOSIS — R278 Other lack of coordination: Secondary | ICD-10-CM | POA: Diagnosis not present

## 2020-03-24 DIAGNOSIS — R296 Repeated falls: Secondary | ICD-10-CM | POA: Diagnosis not present

## 2020-03-24 DIAGNOSIS — F0390 Unspecified dementia without behavioral disturbance: Secondary | ICD-10-CM | POA: Diagnosis not present

## 2020-03-24 DIAGNOSIS — R278 Other lack of coordination: Secondary | ICD-10-CM | POA: Diagnosis not present

## 2020-03-24 DIAGNOSIS — R293 Abnormal posture: Secondary | ICD-10-CM | POA: Diagnosis not present

## 2020-03-25 DIAGNOSIS — R278 Other lack of coordination: Secondary | ICD-10-CM | POA: Diagnosis not present

## 2020-03-25 DIAGNOSIS — R296 Repeated falls: Secondary | ICD-10-CM | POA: Diagnosis not present

## 2020-03-25 DIAGNOSIS — F0390 Unspecified dementia without behavioral disturbance: Secondary | ICD-10-CM | POA: Diagnosis not present

## 2020-03-25 DIAGNOSIS — E785 Hyperlipidemia, unspecified: Secondary | ICD-10-CM | POA: Diagnosis not present

## 2020-03-25 DIAGNOSIS — R293 Abnormal posture: Secondary | ICD-10-CM | POA: Diagnosis not present

## 2020-03-25 DIAGNOSIS — D518 Other vitamin B12 deficiency anemias: Secondary | ICD-10-CM | POA: Diagnosis not present

## 2020-03-25 DIAGNOSIS — N184 Chronic kidney disease, stage 4 (severe): Secondary | ICD-10-CM | POA: Diagnosis not present

## 2020-03-26 DIAGNOSIS — R278 Other lack of coordination: Secondary | ICD-10-CM | POA: Diagnosis not present

## 2020-03-26 DIAGNOSIS — F0390 Unspecified dementia without behavioral disturbance: Secondary | ICD-10-CM | POA: Diagnosis not present

## 2020-03-26 DIAGNOSIS — R296 Repeated falls: Secondary | ICD-10-CM | POA: Diagnosis not present

## 2020-03-26 DIAGNOSIS — R293 Abnormal posture: Secondary | ICD-10-CM | POA: Diagnosis not present

## 2020-03-27 DIAGNOSIS — R296 Repeated falls: Secondary | ICD-10-CM | POA: Diagnosis not present

## 2020-03-27 DIAGNOSIS — R278 Other lack of coordination: Secondary | ICD-10-CM | POA: Diagnosis not present

## 2020-03-27 DIAGNOSIS — R293 Abnormal posture: Secondary | ICD-10-CM | POA: Diagnosis not present

## 2020-03-27 DIAGNOSIS — F0390 Unspecified dementia without behavioral disturbance: Secondary | ICD-10-CM | POA: Diagnosis not present

## 2020-03-30 DIAGNOSIS — F0390 Unspecified dementia without behavioral disturbance: Secondary | ICD-10-CM | POA: Diagnosis not present

## 2020-03-30 DIAGNOSIS — R296 Repeated falls: Secondary | ICD-10-CM | POA: Diagnosis not present

## 2020-03-30 DIAGNOSIS — R278 Other lack of coordination: Secondary | ICD-10-CM | POA: Diagnosis not present

## 2020-03-30 DIAGNOSIS — R293 Abnormal posture: Secondary | ICD-10-CM | POA: Diagnosis not present

## 2020-03-31 DIAGNOSIS — R293 Abnormal posture: Secondary | ICD-10-CM | POA: Diagnosis not present

## 2020-03-31 DIAGNOSIS — R278 Other lack of coordination: Secondary | ICD-10-CM | POA: Diagnosis not present

## 2020-03-31 DIAGNOSIS — F0390 Unspecified dementia without behavioral disturbance: Secondary | ICD-10-CM | POA: Diagnosis not present

## 2020-03-31 DIAGNOSIS — R296 Repeated falls: Secondary | ICD-10-CM | POA: Diagnosis not present

## 2020-04-01 DIAGNOSIS — R296 Repeated falls: Secondary | ICD-10-CM | POA: Diagnosis not present

## 2020-04-01 DIAGNOSIS — R293 Abnormal posture: Secondary | ICD-10-CM | POA: Diagnosis not present

## 2020-04-01 DIAGNOSIS — F0390 Unspecified dementia without behavioral disturbance: Secondary | ICD-10-CM | POA: Diagnosis not present

## 2020-04-01 DIAGNOSIS — R278 Other lack of coordination: Secondary | ICD-10-CM | POA: Diagnosis not present

## 2020-04-02 DIAGNOSIS — R296 Repeated falls: Secondary | ICD-10-CM | POA: Diagnosis not present

## 2020-04-02 DIAGNOSIS — F0390 Unspecified dementia without behavioral disturbance: Secondary | ICD-10-CM | POA: Diagnosis not present

## 2020-04-02 DIAGNOSIS — R278 Other lack of coordination: Secondary | ICD-10-CM | POA: Diagnosis not present

## 2020-04-02 DIAGNOSIS — R293 Abnormal posture: Secondary | ICD-10-CM | POA: Diagnosis not present

## 2020-04-03 DIAGNOSIS — R293 Abnormal posture: Secondary | ICD-10-CM | POA: Diagnosis not present

## 2020-04-03 DIAGNOSIS — R296 Repeated falls: Secondary | ICD-10-CM | POA: Diagnosis not present

## 2020-04-03 DIAGNOSIS — R278 Other lack of coordination: Secondary | ICD-10-CM | POA: Diagnosis not present

## 2020-04-03 DIAGNOSIS — F0390 Unspecified dementia without behavioral disturbance: Secondary | ICD-10-CM | POA: Diagnosis not present

## 2020-04-06 DIAGNOSIS — R293 Abnormal posture: Secondary | ICD-10-CM | POA: Diagnosis not present

## 2020-04-06 DIAGNOSIS — D638 Anemia in other chronic diseases classified elsewhere: Secondary | ICD-10-CM | POA: Diagnosis not present

## 2020-04-06 DIAGNOSIS — M24549 Contracture, unspecified hand: Secondary | ICD-10-CM | POA: Diagnosis not present

## 2020-04-06 DIAGNOSIS — E785 Hyperlipidemia, unspecified: Secondary | ICD-10-CM | POA: Diagnosis not present

## 2020-04-06 DIAGNOSIS — R296 Repeated falls: Secondary | ICD-10-CM | POA: Diagnosis not present

## 2020-04-06 DIAGNOSIS — F0390 Unspecified dementia without behavioral disturbance: Secondary | ICD-10-CM | POA: Diagnosis not present

## 2020-04-06 DIAGNOSIS — R799 Abnormal finding of blood chemistry, unspecified: Secondary | ICD-10-CM | POA: Diagnosis not present

## 2020-04-06 DIAGNOSIS — R278 Other lack of coordination: Secondary | ICD-10-CM | POA: Diagnosis not present

## 2020-04-07 DIAGNOSIS — R296 Repeated falls: Secondary | ICD-10-CM | POA: Diagnosis not present

## 2020-04-07 DIAGNOSIS — F0390 Unspecified dementia without behavioral disturbance: Secondary | ICD-10-CM | POA: Diagnosis not present

## 2020-04-07 DIAGNOSIS — R293 Abnormal posture: Secondary | ICD-10-CM | POA: Diagnosis not present

## 2020-04-07 DIAGNOSIS — R278 Other lack of coordination: Secondary | ICD-10-CM | POA: Diagnosis not present

## 2020-04-08 DIAGNOSIS — R278 Other lack of coordination: Secondary | ICD-10-CM | POA: Diagnosis not present

## 2020-04-08 DIAGNOSIS — R293 Abnormal posture: Secondary | ICD-10-CM | POA: Diagnosis not present

## 2020-04-08 DIAGNOSIS — F0390 Unspecified dementia without behavioral disturbance: Secondary | ICD-10-CM | POA: Diagnosis not present

## 2020-04-08 DIAGNOSIS — R296 Repeated falls: Secondary | ICD-10-CM | POA: Diagnosis not present

## 2020-04-09 DIAGNOSIS — R278 Other lack of coordination: Secondary | ICD-10-CM | POA: Diagnosis not present

## 2020-04-09 DIAGNOSIS — R293 Abnormal posture: Secondary | ICD-10-CM | POA: Diagnosis not present

## 2020-04-09 DIAGNOSIS — R296 Repeated falls: Secondary | ICD-10-CM | POA: Diagnosis not present

## 2020-04-09 DIAGNOSIS — H401134 Primary open-angle glaucoma, bilateral, indeterminate stage: Secondary | ICD-10-CM | POA: Diagnosis not present

## 2020-04-09 DIAGNOSIS — F0390 Unspecified dementia without behavioral disturbance: Secondary | ICD-10-CM | POA: Diagnosis not present

## 2020-04-10 DIAGNOSIS — R278 Other lack of coordination: Secondary | ICD-10-CM | POA: Diagnosis not present

## 2020-04-10 DIAGNOSIS — R293 Abnormal posture: Secondary | ICD-10-CM | POA: Diagnosis not present

## 2020-04-10 DIAGNOSIS — F0390 Unspecified dementia without behavioral disturbance: Secondary | ICD-10-CM | POA: Diagnosis not present

## 2020-04-10 DIAGNOSIS — R296 Repeated falls: Secondary | ICD-10-CM | POA: Diagnosis not present

## 2020-04-13 DIAGNOSIS — D649 Anemia, unspecified: Secondary | ICD-10-CM | POA: Diagnosis not present

## 2020-04-13 DIAGNOSIS — R296 Repeated falls: Secondary | ICD-10-CM | POA: Diagnosis not present

## 2020-04-13 DIAGNOSIS — F0390 Unspecified dementia without behavioral disturbance: Secondary | ICD-10-CM | POA: Diagnosis not present

## 2020-04-13 DIAGNOSIS — R293 Abnormal posture: Secondary | ICD-10-CM | POA: Diagnosis not present

## 2020-04-13 DIAGNOSIS — N183 Chronic kidney disease, stage 3 unspecified: Secondary | ICD-10-CM | POA: Diagnosis not present

## 2020-04-13 DIAGNOSIS — E871 Hypo-osmolality and hyponatremia: Secondary | ICD-10-CM | POA: Diagnosis not present

## 2020-04-13 DIAGNOSIS — E785 Hyperlipidemia, unspecified: Secondary | ICD-10-CM | POA: Diagnosis not present

## 2020-04-13 DIAGNOSIS — R278 Other lack of coordination: Secondary | ICD-10-CM | POA: Diagnosis not present

## 2020-04-14 DIAGNOSIS — F0390 Unspecified dementia without behavioral disturbance: Secondary | ICD-10-CM | POA: Diagnosis not present

## 2020-04-14 DIAGNOSIS — R278 Other lack of coordination: Secondary | ICD-10-CM | POA: Diagnosis not present

## 2020-04-14 DIAGNOSIS — R293 Abnormal posture: Secondary | ICD-10-CM | POA: Diagnosis not present

## 2020-04-14 DIAGNOSIS — R296 Repeated falls: Secondary | ICD-10-CM | POA: Diagnosis not present

## 2020-04-15 DIAGNOSIS — F0390 Unspecified dementia without behavioral disturbance: Secondary | ICD-10-CM | POA: Diagnosis not present

## 2020-04-15 DIAGNOSIS — R296 Repeated falls: Secondary | ICD-10-CM | POA: Diagnosis not present

## 2020-04-15 DIAGNOSIS — R278 Other lack of coordination: Secondary | ICD-10-CM | POA: Diagnosis not present

## 2020-04-15 DIAGNOSIS — R293 Abnormal posture: Secondary | ICD-10-CM | POA: Diagnosis not present

## 2020-04-16 DIAGNOSIS — F0390 Unspecified dementia without behavioral disturbance: Secondary | ICD-10-CM | POA: Diagnosis not present

## 2020-04-16 DIAGNOSIS — R296 Repeated falls: Secondary | ICD-10-CM | POA: Diagnosis not present

## 2020-04-16 DIAGNOSIS — R293 Abnormal posture: Secondary | ICD-10-CM | POA: Diagnosis not present

## 2020-04-16 DIAGNOSIS — R278 Other lack of coordination: Secondary | ICD-10-CM | POA: Diagnosis not present

## 2020-04-17 DIAGNOSIS — R278 Other lack of coordination: Secondary | ICD-10-CM | POA: Diagnosis not present

## 2020-04-17 DIAGNOSIS — R293 Abnormal posture: Secondary | ICD-10-CM | POA: Diagnosis not present

## 2020-04-17 DIAGNOSIS — F0390 Unspecified dementia without behavioral disturbance: Secondary | ICD-10-CM | POA: Diagnosis not present

## 2020-04-17 DIAGNOSIS — R296 Repeated falls: Secondary | ICD-10-CM | POA: Diagnosis not present

## 2020-04-20 DIAGNOSIS — R278 Other lack of coordination: Secondary | ICD-10-CM | POA: Diagnosis not present

## 2020-04-20 DIAGNOSIS — R293 Abnormal posture: Secondary | ICD-10-CM | POA: Diagnosis not present

## 2020-04-20 DIAGNOSIS — F0390 Unspecified dementia without behavioral disturbance: Secondary | ICD-10-CM | POA: Diagnosis not present

## 2020-04-20 DIAGNOSIS — E08311 Diabetes mellitus due to underlying condition with unspecified diabetic retinopathy with macular edema: Secondary | ICD-10-CM | POA: Diagnosis not present

## 2020-04-20 DIAGNOSIS — R296 Repeated falls: Secondary | ICD-10-CM | POA: Diagnosis not present

## 2020-04-21 DIAGNOSIS — R296 Repeated falls: Secondary | ICD-10-CM | POA: Diagnosis not present

## 2020-04-21 DIAGNOSIS — R278 Other lack of coordination: Secondary | ICD-10-CM | POA: Diagnosis not present

## 2020-04-21 DIAGNOSIS — F0390 Unspecified dementia without behavioral disturbance: Secondary | ICD-10-CM | POA: Diagnosis not present

## 2020-04-21 DIAGNOSIS — R293 Abnormal posture: Secondary | ICD-10-CM | POA: Diagnosis not present

## 2020-04-22 DIAGNOSIS — R296 Repeated falls: Secondary | ICD-10-CM | POA: Diagnosis not present

## 2020-04-22 DIAGNOSIS — R278 Other lack of coordination: Secondary | ICD-10-CM | POA: Diagnosis not present

## 2020-04-22 DIAGNOSIS — R293 Abnormal posture: Secondary | ICD-10-CM | POA: Diagnosis not present

## 2020-04-22 DIAGNOSIS — F0390 Unspecified dementia without behavioral disturbance: Secondary | ICD-10-CM | POA: Diagnosis not present

## 2020-04-23 DIAGNOSIS — R296 Repeated falls: Secondary | ICD-10-CM | POA: Diagnosis not present

## 2020-04-23 DIAGNOSIS — R278 Other lack of coordination: Secondary | ICD-10-CM | POA: Diagnosis not present

## 2020-04-23 DIAGNOSIS — F0390 Unspecified dementia without behavioral disturbance: Secondary | ICD-10-CM | POA: Diagnosis not present

## 2020-04-23 DIAGNOSIS — R293 Abnormal posture: Secondary | ICD-10-CM | POA: Diagnosis not present

## 2020-04-24 DIAGNOSIS — R296 Repeated falls: Secondary | ICD-10-CM | POA: Diagnosis not present

## 2020-04-24 DIAGNOSIS — R293 Abnormal posture: Secondary | ICD-10-CM | POA: Diagnosis not present

## 2020-04-24 DIAGNOSIS — F0390 Unspecified dementia without behavioral disturbance: Secondary | ICD-10-CM | POA: Diagnosis not present

## 2020-04-24 DIAGNOSIS — R278 Other lack of coordination: Secondary | ICD-10-CM | POA: Diagnosis not present

## 2020-04-27 DIAGNOSIS — Z8616 Personal history of COVID-19: Secondary | ICD-10-CM | POA: Diagnosis not present

## 2020-04-27 DIAGNOSIS — R296 Repeated falls: Secondary | ICD-10-CM | POA: Diagnosis not present

## 2020-04-27 DIAGNOSIS — F0390 Unspecified dementia without behavioral disturbance: Secondary | ICD-10-CM | POA: Diagnosis not present

## 2020-04-27 DIAGNOSIS — R293 Abnormal posture: Secondary | ICD-10-CM | POA: Diagnosis not present

## 2020-04-27 DIAGNOSIS — R278 Other lack of coordination: Secondary | ICD-10-CM | POA: Diagnosis not present

## 2020-05-04 DIAGNOSIS — E785 Hyperlipidemia, unspecified: Secondary | ICD-10-CM | POA: Diagnosis not present

## 2020-05-04 DIAGNOSIS — E119 Type 2 diabetes mellitus without complications: Secondary | ICD-10-CM | POA: Diagnosis not present

## 2020-05-04 DIAGNOSIS — E039 Hypothyroidism, unspecified: Secondary | ICD-10-CM | POA: Diagnosis not present

## 2020-05-04 DIAGNOSIS — D518 Other vitamin B12 deficiency anemias: Secondary | ICD-10-CM | POA: Diagnosis not present

## 2020-05-04 DIAGNOSIS — E08311 Diabetes mellitus due to underlying condition with unspecified diabetic retinopathy with macular edema: Secondary | ICD-10-CM | POA: Diagnosis not present

## 2020-05-04 DIAGNOSIS — N184 Chronic kidney disease, stage 4 (severe): Secondary | ICD-10-CM | POA: Diagnosis not present

## 2020-05-06 DIAGNOSIS — D538 Other specified nutritional anemias: Secondary | ICD-10-CM | POA: Diagnosis not present

## 2020-05-06 DIAGNOSIS — E119 Type 2 diabetes mellitus without complications: Secondary | ICD-10-CM | POA: Diagnosis not present

## 2020-06-08 DIAGNOSIS — D518 Other vitamin B12 deficiency anemias: Secondary | ICD-10-CM | POA: Diagnosis not present

## 2020-06-10 DIAGNOSIS — I6789 Other cerebrovascular disease: Secondary | ICD-10-CM | POA: Diagnosis not present

## 2020-06-10 DIAGNOSIS — D6489 Other specified anemias: Secondary | ICD-10-CM | POA: Diagnosis not present

## 2020-06-16 DIAGNOSIS — R296 Repeated falls: Secondary | ICD-10-CM | POA: Diagnosis not present

## 2020-06-16 DIAGNOSIS — R293 Abnormal posture: Secondary | ICD-10-CM | POA: Diagnosis not present

## 2020-06-16 DIAGNOSIS — Z8616 Personal history of COVID-19: Secondary | ICD-10-CM | POA: Diagnosis not present

## 2020-06-16 DIAGNOSIS — F0390 Unspecified dementia without behavioral disturbance: Secondary | ICD-10-CM | POA: Diagnosis not present

## 2020-06-16 DIAGNOSIS — R278 Other lack of coordination: Secondary | ICD-10-CM | POA: Diagnosis not present

## 2020-06-17 DIAGNOSIS — R293 Abnormal posture: Secondary | ICD-10-CM | POA: Diagnosis not present

## 2020-06-17 DIAGNOSIS — R296 Repeated falls: Secondary | ICD-10-CM | POA: Diagnosis not present

## 2020-06-17 DIAGNOSIS — R278 Other lack of coordination: Secondary | ICD-10-CM | POA: Diagnosis not present

## 2020-06-17 DIAGNOSIS — Z8616 Personal history of COVID-19: Secondary | ICD-10-CM | POA: Diagnosis not present

## 2020-06-17 DIAGNOSIS — F0390 Unspecified dementia without behavioral disturbance: Secondary | ICD-10-CM | POA: Diagnosis not present

## 2020-06-18 DIAGNOSIS — R296 Repeated falls: Secondary | ICD-10-CM | POA: Diagnosis not present

## 2020-06-18 DIAGNOSIS — F0390 Unspecified dementia without behavioral disturbance: Secondary | ICD-10-CM | POA: Diagnosis not present

## 2020-06-18 DIAGNOSIS — Z8616 Personal history of COVID-19: Secondary | ICD-10-CM | POA: Diagnosis not present

## 2020-06-18 DIAGNOSIS — R278 Other lack of coordination: Secondary | ICD-10-CM | POA: Diagnosis not present

## 2020-06-18 DIAGNOSIS — R293 Abnormal posture: Secondary | ICD-10-CM | POA: Diagnosis not present

## 2020-06-19 DIAGNOSIS — R293 Abnormal posture: Secondary | ICD-10-CM | POA: Diagnosis not present

## 2020-06-19 DIAGNOSIS — R296 Repeated falls: Secondary | ICD-10-CM | POA: Diagnosis not present

## 2020-06-19 DIAGNOSIS — D649 Anemia, unspecified: Secondary | ICD-10-CM | POA: Diagnosis not present

## 2020-06-19 DIAGNOSIS — I150 Renovascular hypertension: Secondary | ICD-10-CM | POA: Diagnosis not present

## 2020-06-19 DIAGNOSIS — K5901 Slow transit constipation: Secondary | ICD-10-CM | POA: Diagnosis not present

## 2020-06-19 DIAGNOSIS — R278 Other lack of coordination: Secondary | ICD-10-CM | POA: Diagnosis not present

## 2020-06-19 DIAGNOSIS — F0390 Unspecified dementia without behavioral disturbance: Secondary | ICD-10-CM | POA: Diagnosis not present

## 2020-06-19 DIAGNOSIS — E119 Type 2 diabetes mellitus without complications: Secondary | ICD-10-CM | POA: Diagnosis not present

## 2020-06-19 DIAGNOSIS — Z8616 Personal history of COVID-19: Secondary | ICD-10-CM | POA: Diagnosis not present

## 2020-06-22 DIAGNOSIS — R293 Abnormal posture: Secondary | ICD-10-CM | POA: Diagnosis not present

## 2020-06-22 DIAGNOSIS — R278 Other lack of coordination: Secondary | ICD-10-CM | POA: Diagnosis not present

## 2020-06-22 DIAGNOSIS — F0390 Unspecified dementia without behavioral disturbance: Secondary | ICD-10-CM | POA: Diagnosis not present

## 2020-06-22 DIAGNOSIS — Z8616 Personal history of COVID-19: Secondary | ICD-10-CM | POA: Diagnosis not present

## 2020-06-22 DIAGNOSIS — R296 Repeated falls: Secondary | ICD-10-CM | POA: Diagnosis not present

## 2020-06-23 DIAGNOSIS — M1990 Unspecified osteoarthritis, unspecified site: Secondary | ICD-10-CM | POA: Diagnosis not present

## 2020-06-23 DIAGNOSIS — R278 Other lack of coordination: Secondary | ICD-10-CM | POA: Diagnosis not present

## 2020-06-23 DIAGNOSIS — F0390 Unspecified dementia without behavioral disturbance: Secondary | ICD-10-CM | POA: Diagnosis not present

## 2020-06-23 DIAGNOSIS — H93013 Transient ischemic deafness, bilateral: Secondary | ICD-10-CM | POA: Diagnosis not present

## 2020-06-24 DIAGNOSIS — H93013 Transient ischemic deafness, bilateral: Secondary | ICD-10-CM | POA: Diagnosis not present

## 2020-06-24 DIAGNOSIS — M1990 Unspecified osteoarthritis, unspecified site: Secondary | ICD-10-CM | POA: Diagnosis not present

## 2020-06-24 DIAGNOSIS — R278 Other lack of coordination: Secondary | ICD-10-CM | POA: Diagnosis not present

## 2020-06-24 DIAGNOSIS — F0390 Unspecified dementia without behavioral disturbance: Secondary | ICD-10-CM | POA: Diagnosis not present

## 2020-06-25 DIAGNOSIS — F0390 Unspecified dementia without behavioral disturbance: Secondary | ICD-10-CM | POA: Diagnosis not present

## 2020-06-25 DIAGNOSIS — R278 Other lack of coordination: Secondary | ICD-10-CM | POA: Diagnosis not present

## 2020-06-25 DIAGNOSIS — M1990 Unspecified osteoarthritis, unspecified site: Secondary | ICD-10-CM | POA: Diagnosis not present

## 2020-06-25 DIAGNOSIS — H93013 Transient ischemic deafness, bilateral: Secondary | ICD-10-CM | POA: Diagnosis not present

## 2020-06-26 DIAGNOSIS — R278 Other lack of coordination: Secondary | ICD-10-CM | POA: Diagnosis not present

## 2020-06-26 DIAGNOSIS — M1990 Unspecified osteoarthritis, unspecified site: Secondary | ICD-10-CM | POA: Diagnosis not present

## 2020-06-26 DIAGNOSIS — H93013 Transient ischemic deafness, bilateral: Secondary | ICD-10-CM | POA: Diagnosis not present

## 2020-06-26 DIAGNOSIS — F0390 Unspecified dementia without behavioral disturbance: Secondary | ICD-10-CM | POA: Diagnosis not present

## 2020-06-29 DIAGNOSIS — M1990 Unspecified osteoarthritis, unspecified site: Secondary | ICD-10-CM | POA: Diagnosis not present

## 2020-06-29 DIAGNOSIS — F0390 Unspecified dementia without behavioral disturbance: Secondary | ICD-10-CM | POA: Diagnosis not present

## 2020-06-29 DIAGNOSIS — H93013 Transient ischemic deafness, bilateral: Secondary | ICD-10-CM | POA: Diagnosis not present

## 2020-06-29 DIAGNOSIS — R278 Other lack of coordination: Secondary | ICD-10-CM | POA: Diagnosis not present

## 2020-06-30 DIAGNOSIS — F0390 Unspecified dementia without behavioral disturbance: Secondary | ICD-10-CM | POA: Diagnosis not present

## 2020-06-30 DIAGNOSIS — H93013 Transient ischemic deafness, bilateral: Secondary | ICD-10-CM | POA: Diagnosis not present

## 2020-06-30 DIAGNOSIS — M1990 Unspecified osteoarthritis, unspecified site: Secondary | ICD-10-CM | POA: Diagnosis not present

## 2020-06-30 DIAGNOSIS — R278 Other lack of coordination: Secondary | ICD-10-CM | POA: Diagnosis not present

## 2020-07-01 DIAGNOSIS — M1990 Unspecified osteoarthritis, unspecified site: Secondary | ICD-10-CM | POA: Diagnosis not present

## 2020-07-01 DIAGNOSIS — F0390 Unspecified dementia without behavioral disturbance: Secondary | ICD-10-CM | POA: Diagnosis not present

## 2020-07-01 DIAGNOSIS — H93013 Transient ischemic deafness, bilateral: Secondary | ICD-10-CM | POA: Diagnosis not present

## 2020-07-01 DIAGNOSIS — R278 Other lack of coordination: Secondary | ICD-10-CM | POA: Diagnosis not present

## 2020-07-02 DIAGNOSIS — M1990 Unspecified osteoarthritis, unspecified site: Secondary | ICD-10-CM | POA: Diagnosis not present

## 2020-07-02 DIAGNOSIS — F0390 Unspecified dementia without behavioral disturbance: Secondary | ICD-10-CM | POA: Diagnosis not present

## 2020-07-02 DIAGNOSIS — H93013 Transient ischemic deafness, bilateral: Secondary | ICD-10-CM | POA: Diagnosis not present

## 2020-07-02 DIAGNOSIS — R278 Other lack of coordination: Secondary | ICD-10-CM | POA: Diagnosis not present

## 2020-07-03 DIAGNOSIS — R278 Other lack of coordination: Secondary | ICD-10-CM | POA: Diagnosis not present

## 2020-07-03 DIAGNOSIS — M1990 Unspecified osteoarthritis, unspecified site: Secondary | ICD-10-CM | POA: Diagnosis not present

## 2020-07-03 DIAGNOSIS — H93013 Transient ischemic deafness, bilateral: Secondary | ICD-10-CM | POA: Diagnosis not present

## 2020-07-03 DIAGNOSIS — F0390 Unspecified dementia without behavioral disturbance: Secondary | ICD-10-CM | POA: Diagnosis not present

## 2020-07-06 DIAGNOSIS — H93013 Transient ischemic deafness, bilateral: Secondary | ICD-10-CM | POA: Diagnosis not present

## 2020-07-06 DIAGNOSIS — F0390 Unspecified dementia without behavioral disturbance: Secondary | ICD-10-CM | POA: Diagnosis not present

## 2020-07-06 DIAGNOSIS — M1990 Unspecified osteoarthritis, unspecified site: Secondary | ICD-10-CM | POA: Diagnosis not present

## 2020-07-06 DIAGNOSIS — R278 Other lack of coordination: Secondary | ICD-10-CM | POA: Diagnosis not present

## 2020-07-08 DIAGNOSIS — H93013 Transient ischemic deafness, bilateral: Secondary | ICD-10-CM | POA: Diagnosis not present

## 2020-07-08 DIAGNOSIS — M1990 Unspecified osteoarthritis, unspecified site: Secondary | ICD-10-CM | POA: Diagnosis not present

## 2020-07-08 DIAGNOSIS — F0390 Unspecified dementia without behavioral disturbance: Secondary | ICD-10-CM | POA: Diagnosis not present

## 2020-07-08 DIAGNOSIS — R278 Other lack of coordination: Secondary | ICD-10-CM | POA: Diagnosis not present

## 2020-07-09 DIAGNOSIS — M1990 Unspecified osteoarthritis, unspecified site: Secondary | ICD-10-CM | POA: Diagnosis not present

## 2020-07-09 DIAGNOSIS — F0390 Unspecified dementia without behavioral disturbance: Secondary | ICD-10-CM | POA: Diagnosis not present

## 2020-07-09 DIAGNOSIS — R278 Other lack of coordination: Secondary | ICD-10-CM | POA: Diagnosis not present

## 2020-07-09 DIAGNOSIS — H93013 Transient ischemic deafness, bilateral: Secondary | ICD-10-CM | POA: Diagnosis not present

## 2020-07-10 DIAGNOSIS — H93013 Transient ischemic deafness, bilateral: Secondary | ICD-10-CM | POA: Diagnosis not present

## 2020-07-10 DIAGNOSIS — M1990 Unspecified osteoarthritis, unspecified site: Secondary | ICD-10-CM | POA: Diagnosis not present

## 2020-07-10 DIAGNOSIS — F0390 Unspecified dementia without behavioral disturbance: Secondary | ICD-10-CM | POA: Diagnosis not present

## 2020-07-10 DIAGNOSIS — R278 Other lack of coordination: Secondary | ICD-10-CM | POA: Diagnosis not present

## 2020-07-11 DIAGNOSIS — M1990 Unspecified osteoarthritis, unspecified site: Secondary | ICD-10-CM | POA: Diagnosis not present

## 2020-07-11 DIAGNOSIS — F0390 Unspecified dementia without behavioral disturbance: Secondary | ICD-10-CM | POA: Diagnosis not present

## 2020-07-11 DIAGNOSIS — R278 Other lack of coordination: Secondary | ICD-10-CM | POA: Diagnosis not present

## 2020-07-11 DIAGNOSIS — H93013 Transient ischemic deafness, bilateral: Secondary | ICD-10-CM | POA: Diagnosis not present

## 2020-07-13 DIAGNOSIS — M1990 Unspecified osteoarthritis, unspecified site: Secondary | ICD-10-CM | POA: Diagnosis not present

## 2020-07-13 DIAGNOSIS — F0390 Unspecified dementia without behavioral disturbance: Secondary | ICD-10-CM | POA: Diagnosis not present

## 2020-07-13 DIAGNOSIS — R278 Other lack of coordination: Secondary | ICD-10-CM | POA: Diagnosis not present

## 2020-07-13 DIAGNOSIS — H93013 Transient ischemic deafness, bilateral: Secondary | ICD-10-CM | POA: Diagnosis not present

## 2020-07-14 DIAGNOSIS — F0390 Unspecified dementia without behavioral disturbance: Secondary | ICD-10-CM | POA: Diagnosis not present

## 2020-07-14 DIAGNOSIS — M1990 Unspecified osteoarthritis, unspecified site: Secondary | ICD-10-CM | POA: Diagnosis not present

## 2020-07-14 DIAGNOSIS — R278 Other lack of coordination: Secondary | ICD-10-CM | POA: Diagnosis not present

## 2020-07-14 DIAGNOSIS — H93013 Transient ischemic deafness, bilateral: Secondary | ICD-10-CM | POA: Diagnosis not present

## 2020-07-15 DIAGNOSIS — R278 Other lack of coordination: Secondary | ICD-10-CM | POA: Diagnosis not present

## 2020-07-15 DIAGNOSIS — H93013 Transient ischemic deafness, bilateral: Secondary | ICD-10-CM | POA: Diagnosis not present

## 2020-07-15 DIAGNOSIS — F0390 Unspecified dementia without behavioral disturbance: Secondary | ICD-10-CM | POA: Diagnosis not present

## 2020-07-15 DIAGNOSIS — M1990 Unspecified osteoarthritis, unspecified site: Secondary | ICD-10-CM | POA: Diagnosis not present

## 2020-07-16 DIAGNOSIS — M1990 Unspecified osteoarthritis, unspecified site: Secondary | ICD-10-CM | POA: Diagnosis not present

## 2020-07-16 DIAGNOSIS — F0390 Unspecified dementia without behavioral disturbance: Secondary | ICD-10-CM | POA: Diagnosis not present

## 2020-07-16 DIAGNOSIS — R278 Other lack of coordination: Secondary | ICD-10-CM | POA: Diagnosis not present

## 2020-07-16 DIAGNOSIS — H93013 Transient ischemic deafness, bilateral: Secondary | ICD-10-CM | POA: Diagnosis not present

## 2020-07-17 DIAGNOSIS — F0390 Unspecified dementia without behavioral disturbance: Secondary | ICD-10-CM | POA: Diagnosis not present

## 2020-07-17 DIAGNOSIS — N1832 Chronic kidney disease, stage 3b: Secondary | ICD-10-CM | POA: Diagnosis not present

## 2020-07-17 DIAGNOSIS — F0280 Dementia in other diseases classified elsewhere without behavioral disturbance: Secondary | ICD-10-CM | POA: Diagnosis not present

## 2020-07-17 DIAGNOSIS — R278 Other lack of coordination: Secondary | ICD-10-CM | POA: Diagnosis not present

## 2020-07-17 DIAGNOSIS — R296 Repeated falls: Secondary | ICD-10-CM | POA: Diagnosis not present

## 2020-07-17 DIAGNOSIS — H93013 Transient ischemic deafness, bilateral: Secondary | ICD-10-CM | POA: Diagnosis not present

## 2020-07-17 DIAGNOSIS — E119 Type 2 diabetes mellitus without complications: Secondary | ICD-10-CM | POA: Diagnosis not present

## 2020-07-17 DIAGNOSIS — H918X3 Other specified hearing loss, bilateral: Secondary | ICD-10-CM | POA: Diagnosis not present

## 2020-07-17 DIAGNOSIS — R54 Age-related physical debility: Secondary | ICD-10-CM | POA: Diagnosis not present

## 2020-07-17 DIAGNOSIS — Z515 Encounter for palliative care: Secondary | ICD-10-CM | POA: Diagnosis not present

## 2020-07-17 DIAGNOSIS — G311 Senile degeneration of brain, not elsewhere classified: Secondary | ICD-10-CM | POA: Diagnosis not present

## 2020-07-17 DIAGNOSIS — F0151 Vascular dementia with behavioral disturbance: Secondary | ICD-10-CM | POA: Diagnosis not present

## 2020-07-17 DIAGNOSIS — M1990 Unspecified osteoarthritis, unspecified site: Secondary | ICD-10-CM | POA: Diagnosis not present

## 2020-07-17 DIAGNOSIS — N183 Chronic kidney disease, stage 3 unspecified: Secondary | ICD-10-CM | POA: Diagnosis not present

## 2020-07-20 DIAGNOSIS — N183 Chronic kidney disease, stage 3 unspecified: Secondary | ICD-10-CM | POA: Diagnosis not present

## 2020-07-20 DIAGNOSIS — H918X3 Other specified hearing loss, bilateral: Secondary | ICD-10-CM | POA: Diagnosis not present

## 2020-07-20 DIAGNOSIS — R296 Repeated falls: Secondary | ICD-10-CM | POA: Diagnosis not present

## 2020-07-20 DIAGNOSIS — R278 Other lack of coordination: Secondary | ICD-10-CM | POA: Diagnosis not present

## 2020-07-20 DIAGNOSIS — F0280 Dementia in other diseases classified elsewhere without behavioral disturbance: Secondary | ICD-10-CM | POA: Diagnosis not present

## 2020-07-20 DIAGNOSIS — M1990 Unspecified osteoarthritis, unspecified site: Secondary | ICD-10-CM | POA: Diagnosis not present

## 2020-07-20 DIAGNOSIS — E119 Type 2 diabetes mellitus without complications: Secondary | ICD-10-CM | POA: Diagnosis not present

## 2020-07-20 DIAGNOSIS — H93013 Transient ischemic deafness, bilateral: Secondary | ICD-10-CM | POA: Diagnosis not present

## 2020-07-20 DIAGNOSIS — F0390 Unspecified dementia without behavioral disturbance: Secondary | ICD-10-CM | POA: Diagnosis not present

## 2020-07-20 DIAGNOSIS — G311 Senile degeneration of brain, not elsewhere classified: Secondary | ICD-10-CM | POA: Diagnosis not present

## 2020-07-22 DIAGNOSIS — E119 Type 2 diabetes mellitus without complications: Secondary | ICD-10-CM | POA: Diagnosis not present

## 2020-07-22 DIAGNOSIS — R296 Repeated falls: Secondary | ICD-10-CM | POA: Diagnosis not present

## 2020-07-22 DIAGNOSIS — F0280 Dementia in other diseases classified elsewhere without behavioral disturbance: Secondary | ICD-10-CM | POA: Diagnosis not present

## 2020-07-22 DIAGNOSIS — G311 Senile degeneration of brain, not elsewhere classified: Secondary | ICD-10-CM | POA: Diagnosis not present

## 2020-07-22 DIAGNOSIS — N183 Chronic kidney disease, stage 3 unspecified: Secondary | ICD-10-CM | POA: Diagnosis not present

## 2020-07-22 DIAGNOSIS — H918X3 Other specified hearing loss, bilateral: Secondary | ICD-10-CM | POA: Diagnosis not present

## 2020-07-23 DIAGNOSIS — R296 Repeated falls: Secondary | ICD-10-CM | POA: Diagnosis not present

## 2020-07-23 DIAGNOSIS — H918X3 Other specified hearing loss, bilateral: Secondary | ICD-10-CM | POA: Diagnosis not present

## 2020-07-23 DIAGNOSIS — F0280 Dementia in other diseases classified elsewhere without behavioral disturbance: Secondary | ICD-10-CM | POA: Diagnosis not present

## 2020-07-23 DIAGNOSIS — E119 Type 2 diabetes mellitus without complications: Secondary | ICD-10-CM | POA: Diagnosis not present

## 2020-07-23 DIAGNOSIS — N183 Chronic kidney disease, stage 3 unspecified: Secondary | ICD-10-CM | POA: Diagnosis not present

## 2020-07-23 DIAGNOSIS — G311 Senile degeneration of brain, not elsewhere classified: Secondary | ICD-10-CM | POA: Diagnosis not present

## 2020-07-24 DIAGNOSIS — R54 Age-related physical debility: Secondary | ICD-10-CM | POA: Diagnosis not present

## 2020-07-24 DIAGNOSIS — Z515 Encounter for palliative care: Secondary | ICD-10-CM | POA: Diagnosis not present

## 2020-07-24 DIAGNOSIS — G311 Senile degeneration of brain, not elsewhere classified: Secondary | ICD-10-CM | POA: Diagnosis not present

## 2020-07-24 DIAGNOSIS — N183 Chronic kidney disease, stage 3 unspecified: Secondary | ICD-10-CM | POA: Diagnosis not present

## 2020-07-24 DIAGNOSIS — E119 Type 2 diabetes mellitus without complications: Secondary | ICD-10-CM | POA: Diagnosis not present

## 2020-07-24 DIAGNOSIS — H918X3 Other specified hearing loss, bilateral: Secondary | ICD-10-CM | POA: Diagnosis not present

## 2020-07-24 DIAGNOSIS — F0280 Dementia in other diseases classified elsewhere without behavioral disturbance: Secondary | ICD-10-CM | POA: Diagnosis not present

## 2020-07-24 DIAGNOSIS — R296 Repeated falls: Secondary | ICD-10-CM | POA: Diagnosis not present

## 2020-07-27 DIAGNOSIS — E119 Type 2 diabetes mellitus without complications: Secondary | ICD-10-CM | POA: Diagnosis not present

## 2020-07-27 DIAGNOSIS — G311 Senile degeneration of brain, not elsewhere classified: Secondary | ICD-10-CM | POA: Diagnosis not present

## 2020-07-27 DIAGNOSIS — H918X3 Other specified hearing loss, bilateral: Secondary | ICD-10-CM | POA: Diagnosis not present

## 2020-07-27 DIAGNOSIS — R296 Repeated falls: Secondary | ICD-10-CM | POA: Diagnosis not present

## 2020-07-27 DIAGNOSIS — F0280 Dementia in other diseases classified elsewhere without behavioral disturbance: Secondary | ICD-10-CM | POA: Diagnosis not present

## 2020-07-27 DIAGNOSIS — N183 Chronic kidney disease, stage 3 unspecified: Secondary | ICD-10-CM | POA: Diagnosis not present

## 2020-07-28 DIAGNOSIS — F0280 Dementia in other diseases classified elsewhere without behavioral disturbance: Secondary | ICD-10-CM | POA: Diagnosis not present

## 2020-07-28 DIAGNOSIS — G311 Senile degeneration of brain, not elsewhere classified: Secondary | ICD-10-CM | POA: Diagnosis not present

## 2020-07-28 DIAGNOSIS — E119 Type 2 diabetes mellitus without complications: Secondary | ICD-10-CM | POA: Diagnosis not present

## 2020-07-28 DIAGNOSIS — N183 Chronic kidney disease, stage 3 unspecified: Secondary | ICD-10-CM | POA: Diagnosis not present

## 2020-07-28 DIAGNOSIS — R296 Repeated falls: Secondary | ICD-10-CM | POA: Diagnosis not present

## 2020-07-28 DIAGNOSIS — H918X3 Other specified hearing loss, bilateral: Secondary | ICD-10-CM | POA: Diagnosis not present

## 2020-07-29 DIAGNOSIS — N183 Chronic kidney disease, stage 3 unspecified: Secondary | ICD-10-CM | POA: Diagnosis not present

## 2020-07-29 DIAGNOSIS — H918X3 Other specified hearing loss, bilateral: Secondary | ICD-10-CM | POA: Diagnosis not present

## 2020-07-29 DIAGNOSIS — E119 Type 2 diabetes mellitus without complications: Secondary | ICD-10-CM | POA: Diagnosis not present

## 2020-07-29 DIAGNOSIS — R296 Repeated falls: Secondary | ICD-10-CM | POA: Diagnosis not present

## 2020-07-29 DIAGNOSIS — G311 Senile degeneration of brain, not elsewhere classified: Secondary | ICD-10-CM | POA: Diagnosis not present

## 2020-07-29 DIAGNOSIS — F0280 Dementia in other diseases classified elsewhere without behavioral disturbance: Secondary | ICD-10-CM | POA: Diagnosis not present

## 2020-07-30 DIAGNOSIS — R296 Repeated falls: Secondary | ICD-10-CM | POA: Diagnosis not present

## 2020-07-30 DIAGNOSIS — N183 Chronic kidney disease, stage 3 unspecified: Secondary | ICD-10-CM | POA: Diagnosis not present

## 2020-07-30 DIAGNOSIS — H918X3 Other specified hearing loss, bilateral: Secondary | ICD-10-CM | POA: Diagnosis not present

## 2020-07-30 DIAGNOSIS — G311 Senile degeneration of brain, not elsewhere classified: Secondary | ICD-10-CM | POA: Diagnosis not present

## 2020-07-30 DIAGNOSIS — F0280 Dementia in other diseases classified elsewhere without behavioral disturbance: Secondary | ICD-10-CM | POA: Diagnosis not present

## 2020-07-30 DIAGNOSIS — E119 Type 2 diabetes mellitus without complications: Secondary | ICD-10-CM | POA: Diagnosis not present

## 2020-07-31 DIAGNOSIS — H918X3 Other specified hearing loss, bilateral: Secondary | ICD-10-CM | POA: Diagnosis not present

## 2020-07-31 DIAGNOSIS — E119 Type 2 diabetes mellitus without complications: Secondary | ICD-10-CM | POA: Diagnosis not present

## 2020-07-31 DIAGNOSIS — G311 Senile degeneration of brain, not elsewhere classified: Secondary | ICD-10-CM | POA: Diagnosis not present

## 2020-07-31 DIAGNOSIS — F0280 Dementia in other diseases classified elsewhere without behavioral disturbance: Secondary | ICD-10-CM | POA: Diagnosis not present

## 2020-07-31 DIAGNOSIS — R296 Repeated falls: Secondary | ICD-10-CM | POA: Diagnosis not present

## 2020-07-31 DIAGNOSIS — N183 Chronic kidney disease, stage 3 unspecified: Secondary | ICD-10-CM | POA: Diagnosis not present

## 2020-08-03 DIAGNOSIS — F0280 Dementia in other diseases classified elsewhere without behavioral disturbance: Secondary | ICD-10-CM | POA: Diagnosis not present

## 2020-08-03 DIAGNOSIS — G311 Senile degeneration of brain, not elsewhere classified: Secondary | ICD-10-CM | POA: Diagnosis not present

## 2020-08-03 DIAGNOSIS — H918X3 Other specified hearing loss, bilateral: Secondary | ICD-10-CM | POA: Diagnosis not present

## 2020-08-03 DIAGNOSIS — E119 Type 2 diabetes mellitus without complications: Secondary | ICD-10-CM | POA: Diagnosis not present

## 2020-08-03 DIAGNOSIS — N183 Chronic kidney disease, stage 3 unspecified: Secondary | ICD-10-CM | POA: Diagnosis not present

## 2020-08-03 DIAGNOSIS — R296 Repeated falls: Secondary | ICD-10-CM | POA: Diagnosis not present

## 2020-08-04 ENCOUNTER — Emergency Department (HOSPITAL_COMMUNITY): Payer: Medicare Other

## 2020-08-04 ENCOUNTER — Emergency Department (HOSPITAL_COMMUNITY)
Admission: EM | Admit: 2020-08-04 | Discharge: 2020-08-04 | Disposition: A | Discharge status: Home / Self Care | Payer: Medicare Other | Attending: Emergency Medicine | Admitting: Emergency Medicine

## 2020-08-04 ENCOUNTER — Other Ambulatory Visit: Payer: Self-pay

## 2020-08-04 DIAGNOSIS — I129 Hypertensive chronic kidney disease with stage 1 through stage 4 chronic kidney disease, or unspecified chronic kidney disease: Secondary | ICD-10-CM | POA: Insufficient documentation

## 2020-08-04 DIAGNOSIS — N189 Chronic kidney disease, unspecified: Secondary | ICD-10-CM | POA: Diagnosis not present

## 2020-08-04 DIAGNOSIS — R001 Bradycardia, unspecified: Secondary | ICD-10-CM | POA: Diagnosis not present

## 2020-08-04 DIAGNOSIS — Z20822 Contact with and (suspected) exposure to covid-19: Secondary | ICD-10-CM | POA: Insufficient documentation

## 2020-08-04 DIAGNOSIS — F0280 Dementia in other diseases classified elsewhere without behavioral disturbance: Secondary | ICD-10-CM | POA: Diagnosis not present

## 2020-08-04 DIAGNOSIS — R569 Unspecified convulsions: Secondary | ICD-10-CM | POA: Insufficient documentation

## 2020-08-04 DIAGNOSIS — I517 Cardiomegaly: Secondary | ICD-10-CM | POA: Diagnosis not present

## 2020-08-04 DIAGNOSIS — I10 Essential (primary) hypertension: Secondary | ICD-10-CM | POA: Diagnosis not present

## 2020-08-04 DIAGNOSIS — G40901 Epilepsy, unspecified, not intractable, with status epilepticus: Secondary | ICD-10-CM | POA: Diagnosis not present

## 2020-08-04 DIAGNOSIS — G311 Senile degeneration of brain, not elsewhere classified: Secondary | ICD-10-CM | POA: Diagnosis not present

## 2020-08-04 DIAGNOSIS — H918X3 Other specified hearing loss, bilateral: Secondary | ICD-10-CM | POA: Diagnosis not present

## 2020-08-04 DIAGNOSIS — E119 Type 2 diabetes mellitus without complications: Secondary | ICD-10-CM | POA: Diagnosis not present

## 2020-08-04 DIAGNOSIS — R296 Repeated falls: Secondary | ICD-10-CM | POA: Diagnosis not present

## 2020-08-04 DIAGNOSIS — N183 Chronic kidney disease, stage 3 unspecified: Secondary | ICD-10-CM | POA: Diagnosis not present

## 2020-08-04 DIAGNOSIS — F0390 Unspecified dementia without behavioral disturbance: Secondary | ICD-10-CM | POA: Insufficient documentation

## 2020-08-04 DIAGNOSIS — R55 Syncope and collapse: Secondary | ICD-10-CM | POA: Diagnosis not present

## 2020-08-04 DIAGNOSIS — R000 Tachycardia, unspecified: Secondary | ICD-10-CM | POA: Diagnosis not present

## 2020-08-04 DIAGNOSIS — E1122 Type 2 diabetes mellitus with diabetic chronic kidney disease: Secondary | ICD-10-CM | POA: Diagnosis not present

## 2020-08-04 DIAGNOSIS — Z87891 Personal history of nicotine dependence: Secondary | ICD-10-CM | POA: Insufficient documentation

## 2020-08-04 LAB — CBC
HCT: 25.8 % — ABNORMAL LOW (ref 36.0–46.0)
Hemoglobin: 7.9 g/dL — ABNORMAL LOW (ref 12.0–15.0)
MCH: 33.2 pg (ref 26.0–34.0)
MCHC: 30.6 g/dL (ref 30.0–36.0)
MCV: 108.4 fL — ABNORMAL HIGH (ref 80.0–100.0)
Platelets: 279 10*3/uL (ref 150–400)
RBC: 2.38 MIL/uL — ABNORMAL LOW (ref 3.87–5.11)
RDW: 13 % (ref 11.5–15.5)
WBC: 5.3 10*3/uL (ref 4.0–10.5)
nRBC: 0 % (ref 0.0–0.2)

## 2020-08-04 LAB — BASIC METABOLIC PANEL
Anion gap: 8 (ref 5–15)
BUN: 82 mg/dL — ABNORMAL HIGH (ref 8–23)
CO2: 22 mmol/L (ref 22–32)
Calcium: 9.1 mg/dL (ref 8.9–10.3)
Chloride: 111 mmol/L (ref 98–111)
Creatinine, Ser: 2.42 mg/dL — ABNORMAL HIGH (ref 0.44–1.00)
GFR, Estimated: 19 mL/min — ABNORMAL LOW (ref >60)
Glucose, Bld: 159 mg/dL — ABNORMAL HIGH (ref 70–99)
Potassium: 4.2 mmol/L (ref 3.5–5.1)
Sodium: 141 mmol/L (ref 135–145)

## 2020-08-04 LAB — RESP PANEL BY RT-PCR (FLU A&B, COVID) ARPGX2
Influenza A by PCR: NEGATIVE
Influenza B by PCR: NEGATIVE
SARS Coronavirus 2 by RT PCR: NEGATIVE

## 2020-08-04 LAB — CBG MONITORING, ED: Glucose-Capillary: 125 mg/dL — ABNORMAL HIGH (ref 70–99)

## 2020-08-04 MED ORDER — SODIUM CHLORIDE 0.9 % IV BOLUS
500.0000 mL | Freq: Once | INTRAVENOUS | Status: AC
  Administered 2020-08-04: 500 mL via INTRAVENOUS

## 2020-08-04 NOTE — ED Triage Notes (Signed)
Pt bib ems from [facility] facility. It was stated pt had a grand mal seizure in wheelchair that lasted 2 min. Upon ems arrival pt was in bed communicating with staff. Pt uses ASL and son is on his way.

## 2020-08-04 NOTE — ED Notes (Signed)
Pt's son Bland Span drove pt back to [place].

## 2020-08-04 NOTE — Discharge Instructions (Signed)
You have been seen in the emergency department today for a likely seizure.  Your workup today including labs are within normal limits.  Please follow up with your doctor as soon as possible regarding today's emergency department visit and your likely seizure.  You will also need to follow up with a neurologist as soon as possible, please call for appointment.  If you have been prescribed a medication for your seizures, please take this medication as prescribed.  As we have discussed it is very important that you DO NOT drive until you have been seen and cleared by your neurologist.  Please drink plenty of fluids, get plenty of sleep and avoid any alcohol or drug use.  Return to the emergency department if you have any further seizures, develop any weakness/numbness of any arm/leg, confusion, slurred speech, or sudden/severe headache.

## 2020-08-04 NOTE — ED Notes (Signed)
Patient transported to CT 

## 2020-08-04 NOTE — ED Provider Notes (Signed)
Emergency Department Provider Note   I have reviewed the triage vital signs and the nursing notes.   HISTORY  Chief Complaint Seizures   HPI [name] is a 87 y.o. female with PMH of dementia and deafness presents to the ED with possible seizure activity. She is currently staying at a SNF and was seen by staff with seizure like activity for < 5 min which resolved. She had some confusion afterwards but has since returned to baseline. ASL video interpreter used for encounter. Level 5 caveat: Dementia.   Son arrives to bedside and confirms that mom is at baseline. No prior seizure. Notes that she has been well recently.   Past Medical History:  Diagnosis Date  . Alcohol abuse   . Anemia   . Cataract   . CKD (chronic kidney disease)   . Deaf   . Dementia ([place])   . Diabetes mellitus   . Dry eyes   . Hypercholesteremia   . Hyperlipidemia   . Hypertension     Patient Active Problem List   Diagnosis Date Noted  . Pressure injury of skin 10/09/2019  . AKI (acute kidney injury) ([place]) 10/09/2019  . ARF (acute renal failure) ([place]) 10/07/2019  . Hyperkalemia 10/07/2019  . Swelling of joint of left shoulder 10/07/2019  . Hypertensive urgency 10/07/2019  . DM type 2 (diabetes mellitus, type 2) ([place]) 10/07/2019  . Dementia ([employer]) 10/07/2019  . Acute renal failure (ARF) ([place]) 10/07/2019    Past Surgical History:  Procedure Laterality Date  . CATARACT EXTRACTION W/ INTRAOCULAR LENS IMPLANT     left eye  . EYE SURGERY    . PARS PLANA VITRECTOMY Left 05/04/2017   Procedure: PARS PLANA VITRECTOMY LEFT EYE WITH 25 GAUGE WITH ENDOLASER;  Surgeon: Jalene Mullet, MD;  Location: [facility];  Service: Ophthalmology;  Laterality: Left;    Allergies Patient has no known allergies.  Family History  Family history unknown: Yes    Social History Social History   Tobacco Use  . Smoking status: Former Research scientist (life sciences)  . Smokeless tobacco: Never Used  Vaping Use  . Vaping Use: Never  used  Substance Use Topics  . Alcohol use: Yes  . Drug use: No    Review of Systems  Level 5 caveat: Dementia   ____________________________________________   PHYSICAL EXAM:  VITAL SIGNS: ED Triage Vitals  Enc Vitals Group     BP 08/04/20 1020 121/67     Pulse Rate 08/04/20 1020 88     Resp 08/04/20 1020 (!) 22     Temp 08/04/20 1020 98.1 F (36.7 C)     Temp Source 08/04/20 1020 Oral     SpO2 08/04/20 1020 97 %   Constitutional: Alert w/ baseline confusion. Well appearing and in no acute distress. Eyes: Conjunctivae are normal.  Head: Atraumatic. Nose: No congestion/rhinnorhea. Mouth/Throat: Mucous membranes are moist. No tongue injury.  Neck: No stridor.  Cardiovascular: Normal rate, regular rhythm. Good peripheral circulation. Grossly normal heart sounds.   Respiratory: Normal respiratory effort.  No retractions. Lungs CTAB. Gastrointestinal: Soft and nontender. No distention.  Musculoskeletal: No lower extremity tenderness nor edema. No gross deformities of extremities. Neurologic:  No gross focal neurologic deficits are appreciated.  Skin:  Skin is warm, dry and intact. No rash noted.   ____________________________________________   LABS (all labs ordered are listed, but only abnormal results are displayed)  Labs Reviewed  BASIC METABOLIC PANEL - Abnormal; Notable for the following components:  Result Value   Glucose, Bld 159 (*)    BUN 82 (*)    Creatinine, Ser 2.42 (*)    GFR, Estimated 19 (*)    All other components within normal limits  CBC - Abnormal; Notable for the following components:   RBC 2.38 (*)    Hemoglobin 7.9 (*)    HCT 25.8 (*)    MCV 108.4 (*)    All other components within normal limits  CBG MONITORING, ED - Abnormal; Notable for the following components:   Glucose-Capillary 125 (*)    All other components within normal limits  RESP PANEL BY RT-PCR (FLU A&B, COVID) [id number]    ____________________________________________  EKG   EKG Interpretation  Date/Time:  Tuesday August 04 2020 10:15:51 EDT Ventricular Rate:  52 PR Interval:  164 QRS Duration: 93 QT Interval:  481 QTC Calculation: 448 R Axis:   0 Text Interpretation: Sinus rhythm Consider anterior infarct No acute changes No significant change since last tracing Confirmed by Varney Biles [id number]) on 08/05/2020 9:05:45 PM       ____________________________________________  RADIOLOGY  CT head reviewed.  ____________________________________________   PROCEDURES  Procedure(s) performed:   Procedures  None ____________________________________________   INITIAL IMPRESSION / ASSESSMENT AND PLAN / ED COURSE  Pertinent labs & imaging results that were available during my care of the patient were reviewed by me and considered in my medical decision making (see chart for details).   Patient arrives with seizure activity at SNF. Patient is back to baseline. Son at bedside to confirm. Labs reviewed. Creatinine near baseline with known CKD. CBC with anemia but also near prior values. No PRBC transfusion indicated. CT severely motion degraded. Discussed with son at bedside. Will refer to Neurology as outpatient. No AEDs for now. Son is comfortable with the plan for discharge.    ____________________________________________  FINAL CLINICAL IMPRESSION(S) / ED DIAGNOSES  Final diagnoses:  Seizure-like activity ([place])     MEDICATIONS GIVEN DURING THIS VISIT:  Medications  sodium chloride 0.9 % bolus 500 mL (0 mLs Intravenous Stopped 08/04/20 1359)     Note:  This document was prepared using Dragon voice recognition software and may include unintentional dictation errors.  Nanda Quinton, MD, [facility] Emergency Medicine    Clydette Privitera, [facility], MD 08/06/20 [phone]

## 2020-08-05 DIAGNOSIS — H918X3 Other specified hearing loss, bilateral: Secondary | ICD-10-CM | POA: Diagnosis not present

## 2020-08-05 DIAGNOSIS — E119 Type 2 diabetes mellitus without complications: Secondary | ICD-10-CM | POA: Diagnosis not present

## 2020-08-05 DIAGNOSIS — N183 Chronic kidney disease, stage 3 unspecified: Secondary | ICD-10-CM | POA: Diagnosis not present

## 2020-08-05 DIAGNOSIS — R296 Repeated falls: Secondary | ICD-10-CM | POA: Diagnosis not present

## 2020-08-05 DIAGNOSIS — G311 Senile degeneration of brain, not elsewhere classified: Secondary | ICD-10-CM | POA: Diagnosis not present

## 2020-08-05 DIAGNOSIS — F0280 Dementia in other diseases classified elsewhere without behavioral disturbance: Secondary | ICD-10-CM | POA: Diagnosis not present

## 2020-08-06 DIAGNOSIS — N183 Chronic kidney disease, stage 3 unspecified: Secondary | ICD-10-CM | POA: Diagnosis not present

## 2020-08-06 DIAGNOSIS — H918X3 Other specified hearing loss, bilateral: Secondary | ICD-10-CM | POA: Diagnosis not present

## 2020-08-06 DIAGNOSIS — E119 Type 2 diabetes mellitus without complications: Secondary | ICD-10-CM | POA: Diagnosis not present

## 2020-08-06 DIAGNOSIS — R296 Repeated falls: Secondary | ICD-10-CM | POA: Diagnosis not present

## 2020-08-06 DIAGNOSIS — F0280 Dementia in other diseases classified elsewhere without behavioral disturbance: Secondary | ICD-10-CM | POA: Diagnosis not present

## 2020-08-06 DIAGNOSIS — G311 Senile degeneration of brain, not elsewhere classified: Secondary | ICD-10-CM | POA: Diagnosis not present

## 2020-08-07 DIAGNOSIS — R296 Repeated falls: Secondary | ICD-10-CM | POA: Diagnosis not present

## 2020-08-07 DIAGNOSIS — H918X3 Other specified hearing loss, bilateral: Secondary | ICD-10-CM | POA: Diagnosis not present

## 2020-08-07 DIAGNOSIS — F0280 Dementia in other diseases classified elsewhere without behavioral disturbance: Secondary | ICD-10-CM | POA: Diagnosis not present

## 2020-08-07 DIAGNOSIS — G311 Senile degeneration of brain, not elsewhere classified: Secondary | ICD-10-CM | POA: Diagnosis not present

## 2020-08-07 DIAGNOSIS — N183 Chronic kidney disease, stage 3 unspecified: Secondary | ICD-10-CM | POA: Diagnosis not present

## 2020-08-07 DIAGNOSIS — E119 Type 2 diabetes mellitus without complications: Secondary | ICD-10-CM | POA: Diagnosis not present

## 2020-08-10 DIAGNOSIS — R2689 Other abnormalities of gait and mobility: Secondary | ICD-10-CM | POA: Diagnosis not present

## 2020-08-10 DIAGNOSIS — R296 Repeated falls: Secondary | ICD-10-CM | POA: Diagnosis not present

## 2020-08-10 DIAGNOSIS — M1388 Other specified arthritis, other site: Secondary | ICD-10-CM | POA: Diagnosis not present

## 2020-08-10 DIAGNOSIS — E119 Type 2 diabetes mellitus without complications: Secondary | ICD-10-CM | POA: Diagnosis not present

## 2020-08-10 DIAGNOSIS — E7849 Other hyperlipidemia: Secondary | ICD-10-CM | POA: Diagnosis not present

## 2020-08-10 DIAGNOSIS — N1831 Chronic kidney disease, stage 3a: Secondary | ICD-10-CM | POA: Diagnosis not present

## 2020-08-10 DIAGNOSIS — H918X3 Other specified hearing loss, bilateral: Secondary | ICD-10-CM | POA: Diagnosis not present

## 2020-08-10 DIAGNOSIS — I150 Renovascular hypertension: Secondary | ICD-10-CM | POA: Diagnosis not present

## 2020-08-10 DIAGNOSIS — I6789 Other cerebrovascular disease: Secondary | ICD-10-CM | POA: Diagnosis not present

## 2020-08-10 DIAGNOSIS — F0280 Dementia in other diseases classified elsewhere without behavioral disturbance: Secondary | ICD-10-CM | POA: Diagnosis not present

## 2020-08-10 DIAGNOSIS — G4089 Other seizures: Secondary | ICD-10-CM | POA: Diagnosis not present

## 2020-08-10 DIAGNOSIS — G311 Senile degeneration of brain, not elsewhere classified: Secondary | ICD-10-CM | POA: Diagnosis not present

## 2020-08-10 DIAGNOSIS — Z515 Encounter for palliative care: Secondary | ICD-10-CM | POA: Diagnosis not present

## 2020-08-10 DIAGNOSIS — H4089 Other specified glaucoma: Secondary | ICD-10-CM | POA: Diagnosis not present

## 2020-08-10 DIAGNOSIS — N183 Chronic kidney disease, stage 3 unspecified: Secondary | ICD-10-CM | POA: Diagnosis not present

## 2020-08-10 DIAGNOSIS — E8881 Metabolic syndrome: Secondary | ICD-10-CM | POA: Diagnosis not present

## 2020-08-10 DIAGNOSIS — D6489 Other specified anemias: Secondary | ICD-10-CM | POA: Diagnosis not present

## 2020-08-11 DIAGNOSIS — G311 Senile degeneration of brain, not elsewhere classified: Secondary | ICD-10-CM | POA: Diagnosis not present

## 2020-08-11 DIAGNOSIS — E119 Type 2 diabetes mellitus without complications: Secondary | ICD-10-CM | POA: Diagnosis not present

## 2020-08-11 DIAGNOSIS — N183 Chronic kidney disease, stage 3 unspecified: Secondary | ICD-10-CM | POA: Diagnosis not present

## 2020-08-11 DIAGNOSIS — H918X3 Other specified hearing loss, bilateral: Secondary | ICD-10-CM | POA: Diagnosis not present

## 2020-08-11 DIAGNOSIS — R296 Repeated falls: Secondary | ICD-10-CM | POA: Diagnosis not present

## 2020-08-11 DIAGNOSIS — F0280 Dementia in other diseases classified elsewhere without behavioral disturbance: Secondary | ICD-10-CM | POA: Diagnosis not present

## 2020-08-12 DIAGNOSIS — N1832 Chronic kidney disease, stage 3b: Secondary | ICD-10-CM | POA: Diagnosis not present

## 2020-08-12 DIAGNOSIS — D638 Anemia in other chronic diseases classified elsewhere: Secondary | ICD-10-CM | POA: Diagnosis not present

## 2020-08-12 DIAGNOSIS — F0280 Dementia in other diseases classified elsewhere without behavioral disturbance: Secondary | ICD-10-CM | POA: Diagnosis not present

## 2020-08-12 DIAGNOSIS — N178 Other acute kidney failure: Secondary | ICD-10-CM | POA: Diagnosis not present

## 2020-08-14 DIAGNOSIS — E119 Type 2 diabetes mellitus without complications: Secondary | ICD-10-CM | POA: Diagnosis not present

## 2020-08-14 DIAGNOSIS — F0280 Dementia in other diseases classified elsewhere without behavioral disturbance: Secondary | ICD-10-CM | POA: Diagnosis not present

## 2020-08-14 DIAGNOSIS — R296 Repeated falls: Secondary | ICD-10-CM | POA: Diagnosis not present

## 2020-08-14 DIAGNOSIS — H918X3 Other specified hearing loss, bilateral: Secondary | ICD-10-CM | POA: Diagnosis not present

## 2020-08-14 DIAGNOSIS — G311 Senile degeneration of brain, not elsewhere classified: Secondary | ICD-10-CM | POA: Diagnosis not present

## 2020-08-14 DIAGNOSIS — N183 Chronic kidney disease, stage 3 unspecified: Secondary | ICD-10-CM | POA: Diagnosis not present

## 2020-08-17 DIAGNOSIS — G478 Other sleep disorders: Secondary | ICD-10-CM | POA: Diagnosis not present

## 2020-08-17 DIAGNOSIS — N183 Chronic kidney disease, stage 3 unspecified: Secondary | ICD-10-CM | POA: Diagnosis not present

## 2020-08-17 DIAGNOSIS — F419 Anxiety disorder, unspecified: Secondary | ICD-10-CM | POA: Diagnosis not present

## 2020-08-17 DIAGNOSIS — R296 Repeated falls: Secondary | ICD-10-CM | POA: Diagnosis not present

## 2020-08-17 DIAGNOSIS — G311 Senile degeneration of brain, not elsewhere classified: Secondary | ICD-10-CM | POA: Diagnosis not present

## 2020-08-17 DIAGNOSIS — E119 Type 2 diabetes mellitus without complications: Secondary | ICD-10-CM | POA: Diagnosis not present

## 2020-08-17 DIAGNOSIS — F0151 Vascular dementia with behavioral disturbance: Secondary | ICD-10-CM | POA: Diagnosis not present

## 2020-08-17 DIAGNOSIS — H918X3 Other specified hearing loss, bilateral: Secondary | ICD-10-CM | POA: Diagnosis not present

## 2020-08-17 DIAGNOSIS — F0280 Dementia in other diseases classified elsewhere without behavioral disturbance: Secondary | ICD-10-CM | POA: Diagnosis not present

## 2020-08-19 ENCOUNTER — Ambulatory Visit: Payer: Medicare Other | Admitting: Diagnostic Neuroimaging

## 2020-08-19 DIAGNOSIS — G311 Senile degeneration of brain, not elsewhere classified: Secondary | ICD-10-CM | POA: Diagnosis not present

## 2020-08-19 DIAGNOSIS — F0280 Dementia in other diseases classified elsewhere without behavioral disturbance: Secondary | ICD-10-CM | POA: Diagnosis not present

## 2020-08-19 DIAGNOSIS — N183 Chronic kidney disease, stage 3 unspecified: Secondary | ICD-10-CM | POA: Diagnosis not present

## 2020-08-19 DIAGNOSIS — H918X3 Other specified hearing loss, bilateral: Secondary | ICD-10-CM | POA: Diagnosis not present

## 2020-08-19 DIAGNOSIS — N184 Chronic kidney disease, stage 4 (severe): Secondary | ICD-10-CM | POA: Diagnosis not present

## 2020-08-19 DIAGNOSIS — E08311 Diabetes mellitus due to underlying condition with unspecified diabetic retinopathy with macular edema: Secondary | ICD-10-CM | POA: Diagnosis not present

## 2020-08-19 DIAGNOSIS — R296 Repeated falls: Secondary | ICD-10-CM | POA: Diagnosis not present

## 2020-08-19 DIAGNOSIS — E119 Type 2 diabetes mellitus without complications: Secondary | ICD-10-CM | POA: Diagnosis not present

## 2020-08-20 DIAGNOSIS — N183 Chronic kidney disease, stage 3 unspecified: Secondary | ICD-10-CM | POA: Diagnosis not present

## 2020-08-20 DIAGNOSIS — F0280 Dementia in other diseases classified elsewhere without behavioral disturbance: Secondary | ICD-10-CM | POA: Diagnosis not present

## 2020-08-20 DIAGNOSIS — H918X3 Other specified hearing loss, bilateral: Secondary | ICD-10-CM | POA: Diagnosis not present

## 2020-08-20 DIAGNOSIS — E119 Type 2 diabetes mellitus without complications: Secondary | ICD-10-CM | POA: Diagnosis not present

## 2020-08-20 DIAGNOSIS — G311 Senile degeneration of brain, not elsewhere classified: Secondary | ICD-10-CM | POA: Diagnosis not present

## 2020-08-20 DIAGNOSIS — R296 Repeated falls: Secondary | ICD-10-CM | POA: Diagnosis not present

## 2020-08-21 DIAGNOSIS — R040 Epistaxis: Secondary | ICD-10-CM | POA: Diagnosis not present

## 2020-08-21 DIAGNOSIS — N1831 Chronic kidney disease, stage 3a: Secondary | ICD-10-CM | POA: Diagnosis not present

## 2020-08-21 DIAGNOSIS — N179 Acute kidney failure, unspecified: Secondary | ICD-10-CM | POA: Diagnosis not present

## 2020-08-21 DIAGNOSIS — F0151 Vascular dementia with behavioral disturbance: Secondary | ICD-10-CM | POA: Diagnosis not present

## 2020-08-23 DIAGNOSIS — H918X3 Other specified hearing loss, bilateral: Secondary | ICD-10-CM | POA: Diagnosis not present

## 2020-08-23 DIAGNOSIS — F0280 Dementia in other diseases classified elsewhere without behavioral disturbance: Secondary | ICD-10-CM | POA: Diagnosis not present

## 2020-08-23 DIAGNOSIS — E119 Type 2 diabetes mellitus without complications: Secondary | ICD-10-CM | POA: Diagnosis not present

## 2020-08-23 DIAGNOSIS — G311 Senile degeneration of brain, not elsewhere classified: Secondary | ICD-10-CM | POA: Diagnosis not present

## 2020-08-23 DIAGNOSIS — R296 Repeated falls: Secondary | ICD-10-CM | POA: Diagnosis not present

## 2020-08-23 DIAGNOSIS — N183 Chronic kidney disease, stage 3 unspecified: Secondary | ICD-10-CM | POA: Diagnosis not present

## 2020-08-23 DIAGNOSIS — R54 Age-related physical debility: Secondary | ICD-10-CM | POA: Diagnosis not present

## 2020-08-23 DIAGNOSIS — Z515 Encounter for palliative care: Secondary | ICD-10-CM | POA: Diagnosis not present

## 2020-08-24 DIAGNOSIS — R296 Repeated falls: Secondary | ICD-10-CM | POA: Diagnosis not present

## 2020-08-24 DIAGNOSIS — E119 Type 2 diabetes mellitus without complications: Secondary | ICD-10-CM | POA: Diagnosis not present

## 2020-08-24 DIAGNOSIS — N183 Chronic kidney disease, stage 3 unspecified: Secondary | ICD-10-CM | POA: Diagnosis not present

## 2020-08-24 DIAGNOSIS — D509 Iron deficiency anemia, unspecified: Secondary | ICD-10-CM | POA: Diagnosis not present

## 2020-08-24 DIAGNOSIS — H918X3 Other specified hearing loss, bilateral: Secondary | ICD-10-CM | POA: Diagnosis not present

## 2020-08-24 DIAGNOSIS — G311 Senile degeneration of brain, not elsewhere classified: Secondary | ICD-10-CM | POA: Diagnosis not present

## 2020-08-24 DIAGNOSIS — F0280 Dementia in other diseases classified elsewhere without behavioral disturbance: Secondary | ICD-10-CM | POA: Diagnosis not present

## 2020-08-26 ENCOUNTER — Ambulatory Visit (INDEPENDENT_AMBULATORY_CARE_PROVIDER_SITE_OTHER): Payer: Medicare Other | Admitting: Diagnostic Neuroimaging

## 2020-08-26 ENCOUNTER — Other Ambulatory Visit: Payer: Self-pay

## 2020-08-26 ENCOUNTER — Encounter: Payer: Self-pay | Admitting: Diagnostic Neuroimaging

## 2020-08-26 VITALS — BP 106/62 | HR 55 | Wt 113.2 lb

## 2020-08-26 DIAGNOSIS — G311 Senile degeneration of brain, not elsewhere classified: Secondary | ICD-10-CM | POA: Diagnosis not present

## 2020-08-26 DIAGNOSIS — N183 Chronic kidney disease, stage 3 unspecified: Secondary | ICD-10-CM | POA: Diagnosis not present

## 2020-08-26 DIAGNOSIS — R569 Unspecified convulsions: Secondary | ICD-10-CM

## 2020-08-26 DIAGNOSIS — H918X3 Other specified hearing loss, bilateral: Secondary | ICD-10-CM | POA: Diagnosis not present

## 2020-08-26 DIAGNOSIS — E119 Type 2 diabetes mellitus without complications: Secondary | ICD-10-CM | POA: Diagnosis not present

## 2020-08-26 DIAGNOSIS — R296 Repeated falls: Secondary | ICD-10-CM | POA: Diagnosis not present

## 2020-08-26 DIAGNOSIS — F0280 Dementia in other diseases classified elsewhere without behavioral disturbance: Secondary | ICD-10-CM | POA: Diagnosis not present

## 2020-08-26 NOTE — Progress Notes (Signed)
[facility]  PATIENT: [name] DOB: 1933/01/19  REFERRING CLINICIAN: Polite, Latrice, FNP HISTORY FROM: patient  REASON FOR VISIT: new consult    HISTORICAL  CHIEF COMPLAINT:  Chief Complaint  Patient presents with  . Seizure like activity    Rm 7 New Pt, caregiver- [name], interpreter- [name] lives at [facility]    HISTORY OF PRESENT ILLNESS:   87 year old female here for evaluation of seizure.  Patient has history of hearing loss, communicates with American sign language, and also severe dementia.  Patient is here with caregiver and interpreter.  She lives at skilled nursing facility for past 1 year.  She has a son who lives in the area but was not able to come to this visit.  08/04/2020 patient was at skilled nursing facility, had apparent seizure-like activity for few minutes while sitting in a wheelchair and was brought to the hospital for evaluation.  Symptoms resolved fairly quickly and she was at baseline by the time she was in the ER.  Lab testing and CT of the head were obtained but she could not tolerate CT scanner without significant motion artifact.  Since that time patient has at her baseline.    REVIEW OF SYSTEMS: Full 14 system review of systems performed and negative with exception of: As per HPI.  ALLERGIES: No Known Allergies  HOME MEDICATIONS: Outpatient Medications Prior to Visit  Medication Sig Dispense Refill  . Ascorbic Acid (VITAMIN C) 500 MG CAPS 1 capsule DAILY (route: oral)    . aspirin 81 MG chewable tablet Chew 81 mg by mouth daily. (0700)    . B Complex Vitamins (B COMPLEX 1 PO) 1 tablet DAILY (route: oral)    . benztropine (COGENTIN) 0.5 MG tablet Take 0.5 mg by mouth 2 (two) times daily.    . brimonidine-timolol (COMBIGAN) 0.2-0.5 % ophthalmic solution Place 1 drop into both eyes 2 (two) times daily. (Separate from other eye drops by at least 10 minutes)    . carvedilol (COREG) 25 MG tablet Take 25  mg by mouth 2 (two) times daily. (0700 & 1900)    . cloNIDine (CATAPRES) 0.3 MG tablet Take 1 tablet (0.3 mg total) by mouth 2 (two) times daily. (0700)    . Dextrose, Diabetic Use, (INSTA-GLUCOSE PO) Take 1 Dose by mouth daily as needed (for blood sugar less than 70--recheck in 15 minutes.).    Marland Kitchen diclofenac Sodium (VOLTAREN) 1 % GEL Per instructions 2 TIMES DAILY (route: topical)    . divalproex (DEPAKOTE) 125 MG DR tablet 1 tablet 2 TIMES DAILY (route: oral)    . docusate sodium (COLACE) 100 MG capsule Take 100 mg by mouth daily. (0700)    . donepezil (ARICEPT) 10 MG tablet Take 10 mg by mouth at bedtime. (1900)    . doxazosin (CARDURA) 8 MG tablet Take 16 mg by mouth at bedtime. (1900)    . escitalopram (LEXAPRO) 10 MG tablet Take 10 mg by mouth daily. (0700)    . ferrous sulfate 325 (65 FE) MG tablet Take 325 mg by mouth daily. (0700)    . Glycerin-Hypromellose-PEG 400 0.2-0.2-1 % SOLN Place 1 drop into both eyes 2 (two) times daily. (1200 and 1900)    . hydrOXYzine (ATARAX/VISTARIL) 10 MG tablet Take 10 mg by mouth 2 (two) times daily.    . ILEVRO 0.3 % ophthalmic suspension Place 1 drop into the left eye daily.     Marland Kitchen lovastatin (MEVACOR) 10 MG tablet Take 10 mg  by mouth at bedtime.    . mirtazapine (REMERON) 15 MG tablet Take 15 mg by mouth at bedtime.     . Nutritional Supplements (NUTRITIONAL DRINK PO) Take 1 each by mouth in the morning and at bedtime.    . Skin Protectants, Misc. (MINERIN CREME) CREA SPREAD TOPICALLY TO FEET ONCE DAILY **NOT BETWEEN TOES*    . traZODone (DESYREL) 50 MG tablet Take 50 mg by mouth at bedtime. (1900)     No facility-administered medications prior to visit.    PAST MEDICAL HISTORY: Past Medical History:  Diagnosis Date  . Alcohol abuse   . Anemia   . Aphasia   . Cataract   . CKD (chronic kidney disease)   . CKD (chronic kidney disease), stage IV ([place])   . Deaf   . Deaf, bilateral   . Dementia ([place])   . Dementia ([place])   . Diabetes mellitus    . Dry eyes   . Hypercholesteremia   . Hyperlipidemia   . Hypertension     PAST SURGICAL HISTORY: Past Surgical History:  Procedure Laterality Date  . CATARACT EXTRACTION W/ INTRAOCULAR LENS IMPLANT     left eye  . EYE SURGERY    . PARS PLANA VITRECTOMY Left 05/04/2017   Procedure: PARS PLANA VITRECTOMY LEFT EYE WITH 25 GAUGE WITH ENDOLASER;  Surgeon: Jalene Mullet, MD;  Location: [facility];  Service: Ophthalmology;  Laterality: Left;    FAMILY HISTORY: Family History  Family history unknown: Yes    SOCIAL HISTORY: Social History   Socioeconomic History  . Marital status: Widowed    Spouse name: Not on file  . Number of children: 2  . Years of education: 60  . Highest education level: Not on file  Occupational History    Comment: na  Tobacco Use  . Smoking status: Former Research scientist (life sciences)  . Smokeless tobacco: Never Used  Vaping Use  . Vaping Use: Never used  Substance and Sexual Activity  . Alcohol use: Not Currently  . Drug use: No  . Sexual activity: Not on file  Other Topics Concern  . Not on file  Social History Narrative   08/26/20 lives at [facility] and [place] from [employer]   Son is legal guardian, [name]  [phone]   Social Determinants of Health   Financial Resource Strain: Not on file  Food Insecurity: Not on file  Transportation Needs: Not on file  Physical Activity: Not on file  Stress: Not on file  Social Connections: Not on file  Intimate Partner Violence: Not on file     PHYSICAL EXAM  GENERAL EXAM/CONSTITUTIONAL: Vitals:  Vitals:   08/26/20 0949  BP: 106/62  Pulse: (!) 55  Weight: 113 lb 3.2 oz (51.3 kg)   Body mass index is 20.37 kg/m. Wt Readings from Last 3 Encounters:  08/26/20 113 lb 3.2 oz (51.3 kg)  10/21/19 145 lb 8.1 oz (66 kg)  05/04/17 130 lb (59 kg)    Patient is in no distress; well developed, nourished and groomed; neck is supple   NEUROLOGIC: MENTAL STATUS:  No flowsheet data  found.  awake, alert, oriented to person  COMMUNICATES VIA ASL; SEVERELY LIMITED FLUENCY AND COMPREHENSION PER INTERPRETER   CRANIAL NERVE:   5th - facial sensation symmetric  7th - facial strength symmetric  8th - hearing intact  12th - tongue protrusion midline  OROLINGUAL DYSKINESIA (EDENTULOUS)  MOTOR:   normal bulk and tone, MOVES ARMS/LEGS SYMM  FOLDING MAGAZINE  AND [place]  SENSORY:   normal and symmetric to light touch  COORDINATION:   finger-nose-finger, fine finger movements --> MOVES SYMM; DIFF TO FOLLOW COMMANDS  GAIT/STATION:   IN WHEEL CHAIR     DIAGNOSTIC DATA (LABS, IMAGING, TESTING) - I reviewed patient records, labs, notes, testing and imaging myself where available.  Lab Results  Component Value Date   WBC 5.3 08/04/2020   HGB 7.9 (L) 08/04/2020   HCT 25.8 (L) 08/04/2020   MCV 108.4 (H) 08/04/2020   PLT 279 08/04/2020      Component Value Date/Time   NA 141 08/04/2020 1015   K 4.2 08/04/2020 1015   CL 111 08/04/2020 1015   CO2 22 08/04/2020 1015   GLUCOSE 159 (H) 08/04/2020 1015   BUN 82 (H) 08/04/2020 1015   CREATININE 2.42 (H) 08/04/2020 1015   CALCIUM 9.1 08/04/2020 1015   PROT 5.5 (L) 10/18/2019 0419   ALBUMIN 2.9 (L) 10/18/2019 0419   AST 35 10/18/2019 0419   ALT 34 10/18/2019 0419   ALKPHOS 55 10/18/2019 0419   BILITOT 0.5 10/18/2019 0419   GFRNONAA 19 (L) 08/04/2020 1015   GFRAA 26 (L) 10/19/2019 0443   Lab Results  Component Value Date   CHOL 127 12/23/2010   HDL 49 12/23/2010   LDLCALC 61 12/23/2010   TRIG 84 12/23/2010   CHOLHDL 2.6 12/23/2010   Lab Results  Component Value Date   HGBA1C 5.8 (H) 10/17/2019   Lab Results  Component Value Date   VITAMINB12 470 10/17/2019   Lab Results  Component Value Date   TSH 0.236 Test methodology is 3rd generation TSH (L) 01/27/2007    10/07/19 CT head / cervical  1. No acute intracranial abnormalities. Chronic small vessel ischemic change and brain  atrophy. 2. No evidence for cervical spine fracture. 3. Advanced cervical degenerative disc disease.  08/04/20 CT head - Nondiagnostic study due to patient motion despite repeating study. Difficult to exclude hemorrhage or acute infarct.    ASSESSMENT AND PLAN  87 y.o. year old female here with:  Dx:  1. New onset seizure ([place])      PLAN:  NEW ONSET SEIZURE / SPELL - unclear etiology and nature of spell; could be related to underlying dementia - could consider MRI, EEG, but I am not sure patient will tolerated testing as she had a diff time with CT head in ER - recommend to monitor symptoms - hold off on anti-seizure meds for now  DEMENTIA - supportive care  Return for return to PCP, pending if symptoms worsen or fail to improve.    Penni Bombard, MD [id number], [id number] AM Certified in Neurology, Neurophysiology and Neuroimaging  [facility] [address] [place],  [address] [phone]

## 2020-08-26 NOTE — Patient Instructions (Signed)
NEW ONSET SEIZURE / SPELL - unclear etiology and nature of spell; could be related to underlying dementia - could consider MRI, EEG, but I am not sure patient will tolerate testing as she had a diff time with CT head in ER - recommend to monitor symptoms - hold off on anti-seizure meds for now  DEMENTIA - supportive care

## 2020-08-27 DIAGNOSIS — N183 Chronic kidney disease, stage 3 unspecified: Secondary | ICD-10-CM | POA: Diagnosis not present

## 2020-08-27 DIAGNOSIS — H918X3 Other specified hearing loss, bilateral: Secondary | ICD-10-CM | POA: Diagnosis not present

## 2020-08-27 DIAGNOSIS — R296 Repeated falls: Secondary | ICD-10-CM | POA: Diagnosis not present

## 2020-08-27 DIAGNOSIS — F0280 Dementia in other diseases classified elsewhere without behavioral disturbance: Secondary | ICD-10-CM | POA: Diagnosis not present

## 2020-08-27 DIAGNOSIS — G311 Senile degeneration of brain, not elsewhere classified: Secondary | ICD-10-CM | POA: Diagnosis not present

## 2020-08-27 DIAGNOSIS — E119 Type 2 diabetes mellitus without complications: Secondary | ICD-10-CM | POA: Diagnosis not present

## 2020-08-28 DIAGNOSIS — R296 Repeated falls: Secondary | ICD-10-CM | POA: Diagnosis not present

## 2020-08-28 DIAGNOSIS — H918X3 Other specified hearing loss, bilateral: Secondary | ICD-10-CM | POA: Diagnosis not present

## 2020-08-28 DIAGNOSIS — G40909 Epilepsy, unspecified, not intractable, without status epilepticus: Secondary | ICD-10-CM | POA: Diagnosis not present

## 2020-08-28 DIAGNOSIS — F0280 Dementia in other diseases classified elsewhere without behavioral disturbance: Secondary | ICD-10-CM | POA: Diagnosis not present

## 2020-08-28 DIAGNOSIS — E119 Type 2 diabetes mellitus without complications: Secondary | ICD-10-CM | POA: Diagnosis not present

## 2020-08-28 DIAGNOSIS — N183 Chronic kidney disease, stage 3 unspecified: Secondary | ICD-10-CM | POA: Diagnosis not present

## 2020-08-28 DIAGNOSIS — G311 Senile degeneration of brain, not elsewhere classified: Secondary | ICD-10-CM | POA: Diagnosis not present

## 2020-08-28 DIAGNOSIS — D649 Anemia, unspecified: Secondary | ICD-10-CM | POA: Diagnosis not present

## 2020-08-31 DIAGNOSIS — N183 Chronic kidney disease, stage 3 unspecified: Secondary | ICD-10-CM | POA: Diagnosis not present

## 2020-08-31 DIAGNOSIS — N184 Chronic kidney disease, stage 4 (severe): Secondary | ICD-10-CM | POA: Diagnosis not present

## 2020-08-31 DIAGNOSIS — G311 Senile degeneration of brain, not elsewhere classified: Secondary | ICD-10-CM | POA: Diagnosis not present

## 2020-08-31 DIAGNOSIS — R296 Repeated falls: Secondary | ICD-10-CM | POA: Diagnosis not present

## 2020-08-31 DIAGNOSIS — R7989 Other specified abnormal findings of blood chemistry: Secondary | ICD-10-CM | POA: Diagnosis not present

## 2020-08-31 DIAGNOSIS — E119 Type 2 diabetes mellitus without complications: Secondary | ICD-10-CM | POA: Diagnosis not present

## 2020-08-31 DIAGNOSIS — D538 Other specified nutritional anemias: Secondary | ICD-10-CM | POA: Diagnosis not present

## 2020-08-31 DIAGNOSIS — F0280 Dementia in other diseases classified elsewhere without behavioral disturbance: Secondary | ICD-10-CM | POA: Diagnosis not present

## 2020-08-31 DIAGNOSIS — H918X3 Other specified hearing loss, bilateral: Secondary | ICD-10-CM | POA: Diagnosis not present

## 2020-09-02 DIAGNOSIS — G311 Senile degeneration of brain, not elsewhere classified: Secondary | ICD-10-CM | POA: Diagnosis not present

## 2020-09-02 DIAGNOSIS — R296 Repeated falls: Secondary | ICD-10-CM | POA: Diagnosis not present

## 2020-09-02 DIAGNOSIS — N183 Chronic kidney disease, stage 3 unspecified: Secondary | ICD-10-CM | POA: Diagnosis not present

## 2020-09-02 DIAGNOSIS — E119 Type 2 diabetes mellitus without complications: Secondary | ICD-10-CM | POA: Diagnosis not present

## 2020-09-02 DIAGNOSIS — F0280 Dementia in other diseases classified elsewhere without behavioral disturbance: Secondary | ICD-10-CM | POA: Diagnosis not present

## 2020-09-02 DIAGNOSIS — H918X3 Other specified hearing loss, bilateral: Secondary | ICD-10-CM | POA: Diagnosis not present

## 2020-09-03 DIAGNOSIS — R296 Repeated falls: Secondary | ICD-10-CM | POA: Diagnosis not present

## 2020-09-03 DIAGNOSIS — N183 Chronic kidney disease, stage 3 unspecified: Secondary | ICD-10-CM | POA: Diagnosis not present

## 2020-09-03 DIAGNOSIS — H918X3 Other specified hearing loss, bilateral: Secondary | ICD-10-CM | POA: Diagnosis not present

## 2020-09-03 DIAGNOSIS — G311 Senile degeneration of brain, not elsewhere classified: Secondary | ICD-10-CM | POA: Diagnosis not present

## 2020-09-03 DIAGNOSIS — F0280 Dementia in other diseases classified elsewhere without behavioral disturbance: Secondary | ICD-10-CM | POA: Diagnosis not present

## 2020-09-03 DIAGNOSIS — E119 Type 2 diabetes mellitus without complications: Secondary | ICD-10-CM | POA: Diagnosis not present

## 2020-09-07 DIAGNOSIS — E119 Type 2 diabetes mellitus without complications: Secondary | ICD-10-CM | POA: Diagnosis not present

## 2020-09-07 DIAGNOSIS — H918X3 Other specified hearing loss, bilateral: Secondary | ICD-10-CM | POA: Diagnosis not present

## 2020-09-07 DIAGNOSIS — F0280 Dementia in other diseases classified elsewhere without behavioral disturbance: Secondary | ICD-10-CM | POA: Diagnosis not present

## 2020-09-07 DIAGNOSIS — N183 Chronic kidney disease, stage 3 unspecified: Secondary | ICD-10-CM | POA: Diagnosis not present

## 2020-09-07 DIAGNOSIS — R296 Repeated falls: Secondary | ICD-10-CM | POA: Diagnosis not present

## 2020-09-07 DIAGNOSIS — G311 Senile degeneration of brain, not elsewhere classified: Secondary | ICD-10-CM | POA: Diagnosis not present

## 2020-09-09 DIAGNOSIS — G311 Senile degeneration of brain, not elsewhere classified: Secondary | ICD-10-CM | POA: Diagnosis not present

## 2020-09-09 DIAGNOSIS — E119 Type 2 diabetes mellitus without complications: Secondary | ICD-10-CM | POA: Diagnosis not present

## 2020-09-09 DIAGNOSIS — H918X3 Other specified hearing loss, bilateral: Secondary | ICD-10-CM | POA: Diagnosis not present

## 2020-09-09 DIAGNOSIS — N183 Chronic kidney disease, stage 3 unspecified: Secondary | ICD-10-CM | POA: Diagnosis not present

## 2020-09-09 DIAGNOSIS — F0280 Dementia in other diseases classified elsewhere without behavioral disturbance: Secondary | ICD-10-CM | POA: Diagnosis not present

## 2020-09-09 DIAGNOSIS — R296 Repeated falls: Secondary | ICD-10-CM | POA: Diagnosis not present

## 2020-09-10 DIAGNOSIS — G311 Senile degeneration of brain, not elsewhere classified: Secondary | ICD-10-CM | POA: Diagnosis not present

## 2020-09-10 DIAGNOSIS — H918X3 Other specified hearing loss, bilateral: Secondary | ICD-10-CM | POA: Diagnosis not present

## 2020-09-10 DIAGNOSIS — E119 Type 2 diabetes mellitus without complications: Secondary | ICD-10-CM | POA: Diagnosis not present

## 2020-09-10 DIAGNOSIS — F0280 Dementia in other diseases classified elsewhere without behavioral disturbance: Secondary | ICD-10-CM | POA: Diagnosis not present

## 2020-09-10 DIAGNOSIS — R296 Repeated falls: Secondary | ICD-10-CM | POA: Diagnosis not present

## 2020-09-10 DIAGNOSIS — N183 Chronic kidney disease, stage 3 unspecified: Secondary | ICD-10-CM | POA: Diagnosis not present

## 2020-09-11 DIAGNOSIS — R296 Repeated falls: Secondary | ICD-10-CM | POA: Diagnosis not present

## 2020-09-11 DIAGNOSIS — G311 Senile degeneration of brain, not elsewhere classified: Secondary | ICD-10-CM | POA: Diagnosis not present

## 2020-09-11 DIAGNOSIS — F0280 Dementia in other diseases classified elsewhere without behavioral disturbance: Secondary | ICD-10-CM | POA: Diagnosis not present

## 2020-09-11 DIAGNOSIS — D509 Iron deficiency anemia, unspecified: Secondary | ICD-10-CM | POA: Diagnosis not present

## 2020-09-11 DIAGNOSIS — H918X3 Other specified hearing loss, bilateral: Secondary | ICD-10-CM | POA: Diagnosis not present

## 2020-09-11 DIAGNOSIS — E119 Type 2 diabetes mellitus without complications: Secondary | ICD-10-CM | POA: Diagnosis not present

## 2020-09-11 DIAGNOSIS — N184 Chronic kidney disease, stage 4 (severe): Secondary | ICD-10-CM | POA: Diagnosis not present

## 2020-09-11 DIAGNOSIS — N183 Chronic kidney disease, stage 3 unspecified: Secondary | ICD-10-CM | POA: Diagnosis not present

## 2020-09-14 DIAGNOSIS — E119 Type 2 diabetes mellitus without complications: Secondary | ICD-10-CM | POA: Diagnosis not present

## 2020-09-14 DIAGNOSIS — H918X3 Other specified hearing loss, bilateral: Secondary | ICD-10-CM | POA: Diagnosis not present

## 2020-09-14 DIAGNOSIS — N184 Chronic kidney disease, stage 4 (severe): Secondary | ICD-10-CM | POA: Diagnosis not present

## 2020-09-14 DIAGNOSIS — F0280 Dementia in other diseases classified elsewhere without behavioral disturbance: Secondary | ICD-10-CM | POA: Diagnosis not present

## 2020-09-14 DIAGNOSIS — G311 Senile degeneration of brain, not elsewhere classified: Secondary | ICD-10-CM | POA: Diagnosis not present

## 2020-09-14 DIAGNOSIS — D538 Other specified nutritional anemias: Secondary | ICD-10-CM | POA: Diagnosis not present

## 2020-09-14 DIAGNOSIS — R296 Repeated falls: Secondary | ICD-10-CM | POA: Diagnosis not present

## 2020-09-14 DIAGNOSIS — R0989 Other specified symptoms and signs involving the circulatory and respiratory systems: Secondary | ICD-10-CM | POA: Diagnosis not present

## 2020-09-14 DIAGNOSIS — N183 Chronic kidney disease, stage 3 unspecified: Secondary | ICD-10-CM | POA: Diagnosis not present

## 2020-09-15 DIAGNOSIS — N183 Chronic kidney disease, stage 3 unspecified: Secondary | ICD-10-CM | POA: Diagnosis not present

## 2020-09-15 DIAGNOSIS — E119 Type 2 diabetes mellitus without complications: Secondary | ICD-10-CM | POA: Diagnosis not present

## 2020-09-15 DIAGNOSIS — R296 Repeated falls: Secondary | ICD-10-CM | POA: Diagnosis not present

## 2020-09-15 DIAGNOSIS — F0280 Dementia in other diseases classified elsewhere without behavioral disturbance: Secondary | ICD-10-CM | POA: Diagnosis not present

## 2020-09-15 DIAGNOSIS — G311 Senile degeneration of brain, not elsewhere classified: Secondary | ICD-10-CM | POA: Diagnosis not present

## 2020-09-15 DIAGNOSIS — H918X3 Other specified hearing loss, bilateral: Secondary | ICD-10-CM | POA: Diagnosis not present

## 2020-09-16 DIAGNOSIS — E119 Type 2 diabetes mellitus without complications: Secondary | ICD-10-CM | POA: Diagnosis not present

## 2020-09-16 DIAGNOSIS — G311 Senile degeneration of brain, not elsewhere classified: Secondary | ICD-10-CM | POA: Diagnosis not present

## 2020-09-16 DIAGNOSIS — N183 Chronic kidney disease, stage 3 unspecified: Secondary | ICD-10-CM | POA: Diagnosis not present

## 2020-09-16 DIAGNOSIS — R296 Repeated falls: Secondary | ICD-10-CM | POA: Diagnosis not present

## 2020-09-16 DIAGNOSIS — F0280 Dementia in other diseases classified elsewhere without behavioral disturbance: Secondary | ICD-10-CM | POA: Diagnosis not present

## 2020-09-16 DIAGNOSIS — H918X3 Other specified hearing loss, bilateral: Secondary | ICD-10-CM | POA: Diagnosis not present

## 2020-09-18 DIAGNOSIS — R296 Repeated falls: Secondary | ICD-10-CM | POA: Diagnosis not present

## 2020-09-18 DIAGNOSIS — G311 Senile degeneration of brain, not elsewhere classified: Secondary | ICD-10-CM | POA: Diagnosis not present

## 2020-09-18 DIAGNOSIS — E119 Type 2 diabetes mellitus without complications: Secondary | ICD-10-CM | POA: Diagnosis not present

## 2020-09-18 DIAGNOSIS — H918X3 Other specified hearing loss, bilateral: Secondary | ICD-10-CM | POA: Diagnosis not present

## 2020-09-18 DIAGNOSIS — F0280 Dementia in other diseases classified elsewhere without behavioral disturbance: Secondary | ICD-10-CM | POA: Diagnosis not present

## 2020-09-18 DIAGNOSIS — N183 Chronic kidney disease, stage 3 unspecified: Secondary | ICD-10-CM | POA: Diagnosis not present

## 2020-09-22 DIAGNOSIS — R296 Repeated falls: Secondary | ICD-10-CM | POA: Diagnosis not present

## 2020-09-22 DIAGNOSIS — N183 Chronic kidney disease, stage 3 unspecified: Secondary | ICD-10-CM | POA: Diagnosis not present

## 2020-09-22 DIAGNOSIS — G311 Senile degeneration of brain, not elsewhere classified: Secondary | ICD-10-CM | POA: Diagnosis not present

## 2020-09-22 DIAGNOSIS — H918X3 Other specified hearing loss, bilateral: Secondary | ICD-10-CM | POA: Diagnosis not present

## 2020-09-22 DIAGNOSIS — E119 Type 2 diabetes mellitus without complications: Secondary | ICD-10-CM | POA: Diagnosis not present

## 2020-09-22 DIAGNOSIS — F0280 Dementia in other diseases classified elsewhere without behavioral disturbance: Secondary | ICD-10-CM | POA: Diagnosis not present

## 2020-09-23 DIAGNOSIS — R296 Repeated falls: Secondary | ICD-10-CM | POA: Diagnosis not present

## 2020-09-23 DIAGNOSIS — N183 Chronic kidney disease, stage 3 unspecified: Secondary | ICD-10-CM | POA: Diagnosis not present

## 2020-09-23 DIAGNOSIS — G311 Senile degeneration of brain, not elsewhere classified: Secondary | ICD-10-CM | POA: Diagnosis not present

## 2020-09-23 DIAGNOSIS — Z515 Encounter for palliative care: Secondary | ICD-10-CM | POA: Diagnosis not present

## 2020-09-23 DIAGNOSIS — R54 Age-related physical debility: Secondary | ICD-10-CM | POA: Diagnosis not present

## 2020-09-23 DIAGNOSIS — E119 Type 2 diabetes mellitus without complications: Secondary | ICD-10-CM | POA: Diagnosis not present

## 2020-09-23 DIAGNOSIS — F0280 Dementia in other diseases classified elsewhere without behavioral disturbance: Secondary | ICD-10-CM | POA: Diagnosis not present

## 2020-09-23 DIAGNOSIS — H918X3 Other specified hearing loss, bilateral: Secondary | ICD-10-CM | POA: Diagnosis not present

## 2020-09-24 DIAGNOSIS — N183 Chronic kidney disease, stage 3 unspecified: Secondary | ICD-10-CM | POA: Diagnosis not present

## 2020-09-24 DIAGNOSIS — F0280 Dementia in other diseases classified elsewhere without behavioral disturbance: Secondary | ICD-10-CM | POA: Diagnosis not present

## 2020-09-24 DIAGNOSIS — R296 Repeated falls: Secondary | ICD-10-CM | POA: Diagnosis not present

## 2020-09-24 DIAGNOSIS — E119 Type 2 diabetes mellitus without complications: Secondary | ICD-10-CM | POA: Diagnosis not present

## 2020-09-24 DIAGNOSIS — H918X3 Other specified hearing loss, bilateral: Secondary | ICD-10-CM | POA: Diagnosis not present

## 2020-09-24 DIAGNOSIS — G311 Senile degeneration of brain, not elsewhere classified: Secondary | ICD-10-CM | POA: Diagnosis not present

## 2020-09-28 DIAGNOSIS — H918X3 Other specified hearing loss, bilateral: Secondary | ICD-10-CM | POA: Diagnosis not present

## 2020-09-28 DIAGNOSIS — R296 Repeated falls: Secondary | ICD-10-CM | POA: Diagnosis not present

## 2020-09-28 DIAGNOSIS — G311 Senile degeneration of brain, not elsewhere classified: Secondary | ICD-10-CM | POA: Diagnosis not present

## 2020-09-28 DIAGNOSIS — E119 Type 2 diabetes mellitus without complications: Secondary | ICD-10-CM | POA: Diagnosis not present

## 2020-09-28 DIAGNOSIS — F0280 Dementia in other diseases classified elsewhere without behavioral disturbance: Secondary | ICD-10-CM | POA: Diagnosis not present

## 2020-09-28 DIAGNOSIS — N183 Chronic kidney disease, stage 3 unspecified: Secondary | ICD-10-CM | POA: Diagnosis not present

## 2020-09-30 DIAGNOSIS — E119 Type 2 diabetes mellitus without complications: Secondary | ICD-10-CM | POA: Diagnosis not present

## 2020-09-30 DIAGNOSIS — N183 Chronic kidney disease, stage 3 unspecified: Secondary | ICD-10-CM | POA: Diagnosis not present

## 2020-09-30 DIAGNOSIS — R296 Repeated falls: Secondary | ICD-10-CM | POA: Diagnosis not present

## 2020-09-30 DIAGNOSIS — G311 Senile degeneration of brain, not elsewhere classified: Secondary | ICD-10-CM | POA: Diagnosis not present

## 2020-09-30 DIAGNOSIS — F0280 Dementia in other diseases classified elsewhere without behavioral disturbance: Secondary | ICD-10-CM | POA: Diagnosis not present

## 2020-09-30 DIAGNOSIS — H918X3 Other specified hearing loss, bilateral: Secondary | ICD-10-CM | POA: Diagnosis not present

## 2020-10-01 DIAGNOSIS — N183 Chronic kidney disease, stage 3 unspecified: Secondary | ICD-10-CM | POA: Diagnosis not present

## 2020-10-01 DIAGNOSIS — E119 Type 2 diabetes mellitus without complications: Secondary | ICD-10-CM | POA: Diagnosis not present

## 2020-10-01 DIAGNOSIS — R296 Repeated falls: Secondary | ICD-10-CM | POA: Diagnosis not present

## 2020-10-01 DIAGNOSIS — H918X3 Other specified hearing loss, bilateral: Secondary | ICD-10-CM | POA: Diagnosis not present

## 2020-10-01 DIAGNOSIS — F0280 Dementia in other diseases classified elsewhere without behavioral disturbance: Secondary | ICD-10-CM | POA: Diagnosis not present

## 2020-10-01 DIAGNOSIS — G311 Senile degeneration of brain, not elsewhere classified: Secondary | ICD-10-CM | POA: Diagnosis not present

## 2020-10-02 DIAGNOSIS — E119 Type 2 diabetes mellitus without complications: Secondary | ICD-10-CM | POA: Diagnosis not present

## 2020-10-02 DIAGNOSIS — R296 Repeated falls: Secondary | ICD-10-CM | POA: Diagnosis not present

## 2020-10-02 DIAGNOSIS — H918X3 Other specified hearing loss, bilateral: Secondary | ICD-10-CM | POA: Diagnosis not present

## 2020-10-02 DIAGNOSIS — F0280 Dementia in other diseases classified elsewhere without behavioral disturbance: Secondary | ICD-10-CM | POA: Diagnosis not present

## 2020-10-02 DIAGNOSIS — N183 Chronic kidney disease, stage 3 unspecified: Secondary | ICD-10-CM | POA: Diagnosis not present

## 2020-10-02 DIAGNOSIS — G311 Senile degeneration of brain, not elsewhere classified: Secondary | ICD-10-CM | POA: Diagnosis not present

## 2020-10-05 DIAGNOSIS — G311 Senile degeneration of brain, not elsewhere classified: Secondary | ICD-10-CM | POA: Diagnosis not present

## 2020-10-05 DIAGNOSIS — H918X3 Other specified hearing loss, bilateral: Secondary | ICD-10-CM | POA: Diagnosis not present

## 2020-10-05 DIAGNOSIS — N183 Chronic kidney disease, stage 3 unspecified: Secondary | ICD-10-CM | POA: Diagnosis not present

## 2020-10-05 DIAGNOSIS — E119 Type 2 diabetes mellitus without complications: Secondary | ICD-10-CM | POA: Diagnosis not present

## 2020-10-05 DIAGNOSIS — F0280 Dementia in other diseases classified elsewhere without behavioral disturbance: Secondary | ICD-10-CM | POA: Diagnosis not present

## 2020-10-05 DIAGNOSIS — R296 Repeated falls: Secondary | ICD-10-CM | POA: Diagnosis not present

## 2020-10-07 DIAGNOSIS — E119 Type 2 diabetes mellitus without complications: Secondary | ICD-10-CM | POA: Diagnosis not present

## 2020-10-07 DIAGNOSIS — R296 Repeated falls: Secondary | ICD-10-CM | POA: Diagnosis not present

## 2020-10-07 DIAGNOSIS — F0280 Dementia in other diseases classified elsewhere without behavioral disturbance: Secondary | ICD-10-CM | POA: Diagnosis not present

## 2020-10-07 DIAGNOSIS — G311 Senile degeneration of brain, not elsewhere classified: Secondary | ICD-10-CM | POA: Diagnosis not present

## 2020-10-07 DIAGNOSIS — N183 Chronic kidney disease, stage 3 unspecified: Secondary | ICD-10-CM | POA: Diagnosis not present

## 2020-10-07 DIAGNOSIS — H918X3 Other specified hearing loss, bilateral: Secondary | ICD-10-CM | POA: Diagnosis not present

## 2020-10-08 DIAGNOSIS — E119 Type 2 diabetes mellitus without complications: Secondary | ICD-10-CM | POA: Diagnosis not present

## 2020-10-08 DIAGNOSIS — H918X3 Other specified hearing loss, bilateral: Secondary | ICD-10-CM | POA: Diagnosis not present

## 2020-10-08 DIAGNOSIS — R296 Repeated falls: Secondary | ICD-10-CM | POA: Diagnosis not present

## 2020-10-08 DIAGNOSIS — F0280 Dementia in other diseases classified elsewhere without behavioral disturbance: Secondary | ICD-10-CM | POA: Diagnosis not present

## 2020-10-08 DIAGNOSIS — N183 Chronic kidney disease, stage 3 unspecified: Secondary | ICD-10-CM | POA: Diagnosis not present

## 2020-10-08 DIAGNOSIS — G311 Senile degeneration of brain, not elsewhere classified: Secondary | ICD-10-CM | POA: Diagnosis not present

## 2020-10-10 DIAGNOSIS — G311 Senile degeneration of brain, not elsewhere classified: Secondary | ICD-10-CM | POA: Diagnosis not present

## 2020-10-10 DIAGNOSIS — N183 Chronic kidney disease, stage 3 unspecified: Secondary | ICD-10-CM | POA: Diagnosis not present

## 2020-10-10 DIAGNOSIS — R296 Repeated falls: Secondary | ICD-10-CM | POA: Diagnosis not present

## 2020-10-10 DIAGNOSIS — F0280 Dementia in other diseases classified elsewhere without behavioral disturbance: Secondary | ICD-10-CM | POA: Diagnosis not present

## 2020-10-10 DIAGNOSIS — E119 Type 2 diabetes mellitus without complications: Secondary | ICD-10-CM | POA: Diagnosis not present

## 2020-10-10 DIAGNOSIS — H918X3 Other specified hearing loss, bilateral: Secondary | ICD-10-CM | POA: Diagnosis not present

## 2020-10-12 DIAGNOSIS — F0280 Dementia in other diseases classified elsewhere without behavioral disturbance: Secondary | ICD-10-CM | POA: Diagnosis not present

## 2020-10-12 DIAGNOSIS — R296 Repeated falls: Secondary | ICD-10-CM | POA: Diagnosis not present

## 2020-10-12 DIAGNOSIS — G311 Senile degeneration of brain, not elsewhere classified: Secondary | ICD-10-CM | POA: Diagnosis not present

## 2020-10-12 DIAGNOSIS — N183 Chronic kidney disease, stage 3 unspecified: Secondary | ICD-10-CM | POA: Diagnosis not present

## 2020-10-12 DIAGNOSIS — H918X3 Other specified hearing loss, bilateral: Secondary | ICD-10-CM | POA: Diagnosis not present

## 2020-10-12 DIAGNOSIS — E119 Type 2 diabetes mellitus without complications: Secondary | ICD-10-CM | POA: Diagnosis not present

## 2020-10-13 DIAGNOSIS — F0280 Dementia in other diseases classified elsewhere without behavioral disturbance: Secondary | ICD-10-CM | POA: Diagnosis not present

## 2020-10-13 DIAGNOSIS — R296 Repeated falls: Secondary | ICD-10-CM | POA: Diagnosis not present

## 2020-10-13 DIAGNOSIS — H918X3 Other specified hearing loss, bilateral: Secondary | ICD-10-CM | POA: Diagnosis not present

## 2020-10-13 DIAGNOSIS — G311 Senile degeneration of brain, not elsewhere classified: Secondary | ICD-10-CM | POA: Diagnosis not present

## 2020-10-13 DIAGNOSIS — N183 Chronic kidney disease, stage 3 unspecified: Secondary | ICD-10-CM | POA: Diagnosis not present

## 2020-10-13 DIAGNOSIS — E119 Type 2 diabetes mellitus without complications: Secondary | ICD-10-CM | POA: Diagnosis not present

## 2020-10-14 DIAGNOSIS — G311 Senile degeneration of brain, not elsewhere classified: Secondary | ICD-10-CM | POA: Diagnosis not present

## 2020-10-14 DIAGNOSIS — N183 Chronic kidney disease, stage 3 unspecified: Secondary | ICD-10-CM | POA: Diagnosis not present

## 2020-10-14 DIAGNOSIS — H918X3 Other specified hearing loss, bilateral: Secondary | ICD-10-CM | POA: Diagnosis not present

## 2020-10-14 DIAGNOSIS — F0280 Dementia in other diseases classified elsewhere without behavioral disturbance: Secondary | ICD-10-CM | POA: Diagnosis not present

## 2020-10-14 DIAGNOSIS — R296 Repeated falls: Secondary | ICD-10-CM | POA: Diagnosis not present

## 2020-10-14 DIAGNOSIS — E119 Type 2 diabetes mellitus without complications: Secondary | ICD-10-CM | POA: Diagnosis not present

## 2020-10-15 DIAGNOSIS — H918X3 Other specified hearing loss, bilateral: Secondary | ICD-10-CM | POA: Diagnosis not present

## 2020-10-15 DIAGNOSIS — R296 Repeated falls: Secondary | ICD-10-CM | POA: Diagnosis not present

## 2020-10-15 DIAGNOSIS — G311 Senile degeneration of brain, not elsewhere classified: Secondary | ICD-10-CM | POA: Diagnosis not present

## 2020-10-15 DIAGNOSIS — F0280 Dementia in other diseases classified elsewhere without behavioral disturbance: Secondary | ICD-10-CM | POA: Diagnosis not present

## 2020-10-15 DIAGNOSIS — N183 Chronic kidney disease, stage 3 unspecified: Secondary | ICD-10-CM | POA: Diagnosis not present

## 2020-10-15 DIAGNOSIS — E119 Type 2 diabetes mellitus without complications: Secondary | ICD-10-CM | POA: Diagnosis not present

## 2020-10-19 DIAGNOSIS — E119 Type 2 diabetes mellitus without complications: Secondary | ICD-10-CM | POA: Diagnosis not present

## 2020-10-19 DIAGNOSIS — R296 Repeated falls: Secondary | ICD-10-CM | POA: Diagnosis not present

## 2020-10-19 DIAGNOSIS — H918X3 Other specified hearing loss, bilateral: Secondary | ICD-10-CM | POA: Diagnosis not present

## 2020-10-19 DIAGNOSIS — F0280 Dementia in other diseases classified elsewhere without behavioral disturbance: Secondary | ICD-10-CM | POA: Diagnosis not present

## 2020-10-19 DIAGNOSIS — N183 Chronic kidney disease, stage 3 unspecified: Secondary | ICD-10-CM | POA: Diagnosis not present

## 2020-10-19 DIAGNOSIS — G311 Senile degeneration of brain, not elsewhere classified: Secondary | ICD-10-CM | POA: Diagnosis not present

## 2020-10-21 DIAGNOSIS — G311 Senile degeneration of brain, not elsewhere classified: Secondary | ICD-10-CM | POA: Diagnosis not present

## 2020-10-21 DIAGNOSIS — N183 Chronic kidney disease, stage 3 unspecified: Secondary | ICD-10-CM | POA: Diagnosis not present

## 2020-10-21 DIAGNOSIS — E119 Type 2 diabetes mellitus without complications: Secondary | ICD-10-CM | POA: Diagnosis not present

## 2020-10-21 DIAGNOSIS — F0280 Dementia in other diseases classified elsewhere without behavioral disturbance: Secondary | ICD-10-CM | POA: Diagnosis not present

## 2020-10-21 DIAGNOSIS — H918X3 Other specified hearing loss, bilateral: Secondary | ICD-10-CM | POA: Diagnosis not present

## 2020-10-21 DIAGNOSIS — R296 Repeated falls: Secondary | ICD-10-CM | POA: Diagnosis not present

## 2020-10-22 DIAGNOSIS — F0280 Dementia in other diseases classified elsewhere without behavioral disturbance: Secondary | ICD-10-CM | POA: Diagnosis not present

## 2020-10-22 DIAGNOSIS — H918X3 Other specified hearing loss, bilateral: Secondary | ICD-10-CM | POA: Diagnosis not present

## 2020-10-22 DIAGNOSIS — G311 Senile degeneration of brain, not elsewhere classified: Secondary | ICD-10-CM | POA: Diagnosis not present

## 2020-10-22 DIAGNOSIS — R296 Repeated falls: Secondary | ICD-10-CM | POA: Diagnosis not present

## 2020-10-22 DIAGNOSIS — N183 Chronic kidney disease, stage 3 unspecified: Secondary | ICD-10-CM | POA: Diagnosis not present

## 2020-10-22 DIAGNOSIS — E119 Type 2 diabetes mellitus without complications: Secondary | ICD-10-CM | POA: Diagnosis not present

## 2020-10-23 DIAGNOSIS — R54 Age-related physical debility: Secondary | ICD-10-CM | POA: Diagnosis not present

## 2020-10-23 DIAGNOSIS — Z515 Encounter for palliative care: Secondary | ICD-10-CM | POA: Diagnosis not present

## 2020-10-23 DIAGNOSIS — N183 Chronic kidney disease, stage 3 unspecified: Secondary | ICD-10-CM | POA: Diagnosis not present

## 2020-10-23 DIAGNOSIS — H918X3 Other specified hearing loss, bilateral: Secondary | ICD-10-CM | POA: Diagnosis not present

## 2020-10-23 DIAGNOSIS — F0280 Dementia in other diseases classified elsewhere without behavioral disturbance: Secondary | ICD-10-CM | POA: Diagnosis not present

## 2020-10-23 DIAGNOSIS — R296 Repeated falls: Secondary | ICD-10-CM | POA: Diagnosis not present

## 2020-10-23 DIAGNOSIS — E119 Type 2 diabetes mellitus without complications: Secondary | ICD-10-CM | POA: Diagnosis not present

## 2020-10-23 DIAGNOSIS — G311 Senile degeneration of brain, not elsewhere classified: Secondary | ICD-10-CM | POA: Diagnosis not present

## 2020-10-28 DIAGNOSIS — R296 Repeated falls: Secondary | ICD-10-CM | POA: Diagnosis not present

## 2020-10-28 DIAGNOSIS — F0280 Dementia in other diseases classified elsewhere without behavioral disturbance: Secondary | ICD-10-CM | POA: Diagnosis not present

## 2020-10-28 DIAGNOSIS — H918X3 Other specified hearing loss, bilateral: Secondary | ICD-10-CM | POA: Diagnosis not present

## 2020-10-28 DIAGNOSIS — N183 Chronic kidney disease, stage 3 unspecified: Secondary | ICD-10-CM | POA: Diagnosis not present

## 2020-10-28 DIAGNOSIS — G311 Senile degeneration of brain, not elsewhere classified: Secondary | ICD-10-CM | POA: Diagnosis not present

## 2020-10-28 DIAGNOSIS — E119 Type 2 diabetes mellitus without complications: Secondary | ICD-10-CM | POA: Diagnosis not present

## 2020-10-29 DIAGNOSIS — R296 Repeated falls: Secondary | ICD-10-CM | POA: Diagnosis not present

## 2020-10-29 DIAGNOSIS — F0280 Dementia in other diseases classified elsewhere without behavioral disturbance: Secondary | ICD-10-CM | POA: Diagnosis not present

## 2020-10-29 DIAGNOSIS — N183 Chronic kidney disease, stage 3 unspecified: Secondary | ICD-10-CM | POA: Diagnosis not present

## 2020-10-29 DIAGNOSIS — G311 Senile degeneration of brain, not elsewhere classified: Secondary | ICD-10-CM | POA: Diagnosis not present

## 2020-10-29 DIAGNOSIS — E119 Type 2 diabetes mellitus without complications: Secondary | ICD-10-CM | POA: Diagnosis not present

## 2020-10-29 DIAGNOSIS — H918X3 Other specified hearing loss, bilateral: Secondary | ICD-10-CM | POA: Diagnosis not present

## 2020-11-02 DIAGNOSIS — H918X3 Other specified hearing loss, bilateral: Secondary | ICD-10-CM | POA: Diagnosis not present

## 2020-11-02 DIAGNOSIS — R296 Repeated falls: Secondary | ICD-10-CM | POA: Diagnosis not present

## 2020-11-02 DIAGNOSIS — G311 Senile degeneration of brain, not elsewhere classified: Secondary | ICD-10-CM | POA: Diagnosis not present

## 2020-11-02 DIAGNOSIS — N183 Chronic kidney disease, stage 3 unspecified: Secondary | ICD-10-CM | POA: Diagnosis not present

## 2020-11-02 DIAGNOSIS — F0280 Dementia in other diseases classified elsewhere without behavioral disturbance: Secondary | ICD-10-CM | POA: Diagnosis not present

## 2020-11-02 DIAGNOSIS — E119 Type 2 diabetes mellitus without complications: Secondary | ICD-10-CM | POA: Diagnosis not present

## 2020-11-03 DIAGNOSIS — F0280 Dementia in other diseases classified elsewhere without behavioral disturbance: Secondary | ICD-10-CM | POA: Diagnosis not present

## 2020-11-03 DIAGNOSIS — R296 Repeated falls: Secondary | ICD-10-CM | POA: Diagnosis not present

## 2020-11-03 DIAGNOSIS — H918X3 Other specified hearing loss, bilateral: Secondary | ICD-10-CM | POA: Diagnosis not present

## 2020-11-03 DIAGNOSIS — N183 Chronic kidney disease, stage 3 unspecified: Secondary | ICD-10-CM | POA: Diagnosis not present

## 2020-11-03 DIAGNOSIS — E119 Type 2 diabetes mellitus without complications: Secondary | ICD-10-CM | POA: Diagnosis not present

## 2020-11-03 DIAGNOSIS — G311 Senile degeneration of brain, not elsewhere classified: Secondary | ICD-10-CM | POA: Diagnosis not present

## 2020-11-04 DIAGNOSIS — I150 Renovascular hypertension: Secondary | ICD-10-CM | POA: Diagnosis not present

## 2020-11-04 DIAGNOSIS — E7849 Other hyperlipidemia: Secondary | ICD-10-CM | POA: Diagnosis not present

## 2020-11-04 DIAGNOSIS — F3489 Other specified persistent mood disorders: Secondary | ICD-10-CM | POA: Diagnosis not present

## 2020-11-04 DIAGNOSIS — E119 Type 2 diabetes mellitus without complications: Secondary | ICD-10-CM | POA: Diagnosis not present

## 2020-11-04 DIAGNOSIS — H918X3 Other specified hearing loss, bilateral: Secondary | ICD-10-CM | POA: Diagnosis not present

## 2020-11-04 DIAGNOSIS — Z515 Encounter for palliative care: Secondary | ICD-10-CM | POA: Diagnosis not present

## 2020-11-04 DIAGNOSIS — G40909 Epilepsy, unspecified, not intractable, without status epilepticus: Secondary | ICD-10-CM | POA: Diagnosis not present

## 2020-11-04 DIAGNOSIS — G311 Senile degeneration of brain, not elsewhere classified: Secondary | ICD-10-CM | POA: Diagnosis not present

## 2020-11-04 DIAGNOSIS — F0280 Dementia in other diseases classified elsewhere without behavioral disturbance: Secondary | ICD-10-CM | POA: Diagnosis not present

## 2020-11-04 DIAGNOSIS — D638 Anemia in other chronic diseases classified elsewhere: Secondary | ICD-10-CM | POA: Diagnosis not present

## 2020-11-04 DIAGNOSIS — R296 Repeated falls: Secondary | ICD-10-CM | POA: Diagnosis not present

## 2020-11-04 DIAGNOSIS — N183 Chronic kidney disease, stage 3 unspecified: Secondary | ICD-10-CM | POA: Diagnosis not present

## 2020-11-05 DIAGNOSIS — F0280 Dementia in other diseases classified elsewhere without behavioral disturbance: Secondary | ICD-10-CM | POA: Diagnosis not present

## 2020-11-05 DIAGNOSIS — R296 Repeated falls: Secondary | ICD-10-CM | POA: Diagnosis not present

## 2020-11-05 DIAGNOSIS — N183 Chronic kidney disease, stage 3 unspecified: Secondary | ICD-10-CM | POA: Diagnosis not present

## 2020-11-05 DIAGNOSIS — G311 Senile degeneration of brain, not elsewhere classified: Secondary | ICD-10-CM | POA: Diagnosis not present

## 2020-11-05 DIAGNOSIS — E119 Type 2 diabetes mellitus without complications: Secondary | ICD-10-CM | POA: Diagnosis not present

## 2020-11-05 DIAGNOSIS — H918X3 Other specified hearing loss, bilateral: Secondary | ICD-10-CM | POA: Diagnosis not present

## 2020-11-09 DIAGNOSIS — F0280 Dementia in other diseases classified elsewhere without behavioral disturbance: Secondary | ICD-10-CM | POA: Diagnosis not present

## 2020-11-09 DIAGNOSIS — G311 Senile degeneration of brain, not elsewhere classified: Secondary | ICD-10-CM | POA: Diagnosis not present

## 2020-11-09 DIAGNOSIS — R296 Repeated falls: Secondary | ICD-10-CM | POA: Diagnosis not present

## 2020-11-09 DIAGNOSIS — H918X3 Other specified hearing loss, bilateral: Secondary | ICD-10-CM | POA: Diagnosis not present

## 2020-11-09 DIAGNOSIS — N183 Chronic kidney disease, stage 3 unspecified: Secondary | ICD-10-CM | POA: Diagnosis not present

## 2020-11-09 DIAGNOSIS — E119 Type 2 diabetes mellitus without complications: Secondary | ICD-10-CM | POA: Diagnosis not present

## 2020-11-10 DIAGNOSIS — G311 Senile degeneration of brain, not elsewhere classified: Secondary | ICD-10-CM | POA: Diagnosis not present

## 2020-11-10 DIAGNOSIS — F0280 Dementia in other diseases classified elsewhere without behavioral disturbance: Secondary | ICD-10-CM | POA: Diagnosis not present

## 2020-11-10 DIAGNOSIS — E119 Type 2 diabetes mellitus without complications: Secondary | ICD-10-CM | POA: Diagnosis not present

## 2020-11-10 DIAGNOSIS — H918X3 Other specified hearing loss, bilateral: Secondary | ICD-10-CM | POA: Diagnosis not present

## 2020-11-10 DIAGNOSIS — R296 Repeated falls: Secondary | ICD-10-CM | POA: Diagnosis not present

## 2020-11-10 DIAGNOSIS — N183 Chronic kidney disease, stage 3 unspecified: Secondary | ICD-10-CM | POA: Diagnosis not present

## 2020-11-11 DIAGNOSIS — E119 Type 2 diabetes mellitus without complications: Secondary | ICD-10-CM | POA: Diagnosis not present

## 2020-11-11 DIAGNOSIS — G311 Senile degeneration of brain, not elsewhere classified: Secondary | ICD-10-CM | POA: Diagnosis not present

## 2020-11-11 DIAGNOSIS — F0280 Dementia in other diseases classified elsewhere without behavioral disturbance: Secondary | ICD-10-CM | POA: Diagnosis not present

## 2020-11-11 DIAGNOSIS — R296 Repeated falls: Secondary | ICD-10-CM | POA: Diagnosis not present

## 2020-11-11 DIAGNOSIS — N183 Chronic kidney disease, stage 3 unspecified: Secondary | ICD-10-CM | POA: Diagnosis not present

## 2020-11-11 DIAGNOSIS — H918X3 Other specified hearing loss, bilateral: Secondary | ICD-10-CM | POA: Diagnosis not present

## 2020-11-12 DIAGNOSIS — E119 Type 2 diabetes mellitus without complications: Secondary | ICD-10-CM | POA: Diagnosis not present

## 2020-11-12 DIAGNOSIS — F0280 Dementia in other diseases classified elsewhere without behavioral disturbance: Secondary | ICD-10-CM | POA: Diagnosis not present

## 2020-11-12 DIAGNOSIS — G311 Senile degeneration of brain, not elsewhere classified: Secondary | ICD-10-CM | POA: Diagnosis not present

## 2020-11-12 DIAGNOSIS — N183 Chronic kidney disease, stage 3 unspecified: Secondary | ICD-10-CM | POA: Diagnosis not present

## 2020-11-12 DIAGNOSIS — H918X3 Other specified hearing loss, bilateral: Secondary | ICD-10-CM | POA: Diagnosis not present

## 2020-11-12 DIAGNOSIS — R296 Repeated falls: Secondary | ICD-10-CM | POA: Diagnosis not present

## 2020-11-16 DIAGNOSIS — G311 Senile degeneration of brain, not elsewhere classified: Secondary | ICD-10-CM | POA: Diagnosis not present

## 2020-11-16 DIAGNOSIS — H918X3 Other specified hearing loss, bilateral: Secondary | ICD-10-CM | POA: Diagnosis not present

## 2020-11-16 DIAGNOSIS — R296 Repeated falls: Secondary | ICD-10-CM | POA: Diagnosis not present

## 2020-11-16 DIAGNOSIS — N183 Chronic kidney disease, stage 3 unspecified: Secondary | ICD-10-CM | POA: Diagnosis not present

## 2020-11-16 DIAGNOSIS — E119 Type 2 diabetes mellitus without complications: Secondary | ICD-10-CM | POA: Diagnosis not present

## 2020-11-16 DIAGNOSIS — F0280 Dementia in other diseases classified elsewhere without behavioral disturbance: Secondary | ICD-10-CM | POA: Diagnosis not present

## 2020-11-18 DIAGNOSIS — R296 Repeated falls: Secondary | ICD-10-CM | POA: Diagnosis not present

## 2020-11-18 DIAGNOSIS — G311 Senile degeneration of brain, not elsewhere classified: Secondary | ICD-10-CM | POA: Diagnosis not present

## 2020-11-18 DIAGNOSIS — F0280 Dementia in other diseases classified elsewhere without behavioral disturbance: Secondary | ICD-10-CM | POA: Diagnosis not present

## 2020-11-18 DIAGNOSIS — N183 Chronic kidney disease, stage 3 unspecified: Secondary | ICD-10-CM | POA: Diagnosis not present

## 2020-11-18 DIAGNOSIS — E119 Type 2 diabetes mellitus without complications: Secondary | ICD-10-CM | POA: Diagnosis not present

## 2020-11-18 DIAGNOSIS — H918X3 Other specified hearing loss, bilateral: Secondary | ICD-10-CM | POA: Diagnosis not present

## 2020-11-19 DIAGNOSIS — R296 Repeated falls: Secondary | ICD-10-CM | POA: Diagnosis not present

## 2020-11-19 DIAGNOSIS — F0280 Dementia in other diseases classified elsewhere without behavioral disturbance: Secondary | ICD-10-CM | POA: Diagnosis not present

## 2020-11-19 DIAGNOSIS — E119 Type 2 diabetes mellitus without complications: Secondary | ICD-10-CM | POA: Diagnosis not present

## 2020-11-19 DIAGNOSIS — H918X3 Other specified hearing loss, bilateral: Secondary | ICD-10-CM | POA: Diagnosis not present

## 2020-11-19 DIAGNOSIS — N183 Chronic kidney disease, stage 3 unspecified: Secondary | ICD-10-CM | POA: Diagnosis not present

## 2020-11-19 DIAGNOSIS — G311 Senile degeneration of brain, not elsewhere classified: Secondary | ICD-10-CM | POA: Diagnosis not present

## 2020-11-20 DIAGNOSIS — G311 Senile degeneration of brain, not elsewhere classified: Secondary | ICD-10-CM | POA: Diagnosis not present

## 2020-11-20 DIAGNOSIS — H918X3 Other specified hearing loss, bilateral: Secondary | ICD-10-CM | POA: Diagnosis not present

## 2020-11-20 DIAGNOSIS — F0280 Dementia in other diseases classified elsewhere without behavioral disturbance: Secondary | ICD-10-CM | POA: Diagnosis not present

## 2020-11-20 DIAGNOSIS — E119 Type 2 diabetes mellitus without complications: Secondary | ICD-10-CM | POA: Diagnosis not present

## 2020-11-20 DIAGNOSIS — R296 Repeated falls: Secondary | ICD-10-CM | POA: Diagnosis not present

## 2020-11-20 DIAGNOSIS — N183 Chronic kidney disease, stage 3 unspecified: Secondary | ICD-10-CM | POA: Diagnosis not present

## 2020-11-23 DIAGNOSIS — Z515 Encounter for palliative care: Secondary | ICD-10-CM | POA: Diagnosis not present

## 2020-11-23 DIAGNOSIS — N183 Chronic kidney disease, stage 3 unspecified: Secondary | ICD-10-CM | POA: Diagnosis not present

## 2020-11-23 DIAGNOSIS — R54 Age-related physical debility: Secondary | ICD-10-CM | POA: Diagnosis not present

## 2020-11-23 DIAGNOSIS — F0280 Dementia in other diseases classified elsewhere without behavioral disturbance: Secondary | ICD-10-CM | POA: Diagnosis not present

## 2020-11-23 DIAGNOSIS — E119 Type 2 diabetes mellitus without complications: Secondary | ICD-10-CM | POA: Diagnosis not present

## 2020-11-23 DIAGNOSIS — H918X3 Other specified hearing loss, bilateral: Secondary | ICD-10-CM | POA: Diagnosis not present

## 2020-11-23 DIAGNOSIS — R296 Repeated falls: Secondary | ICD-10-CM | POA: Diagnosis not present

## 2020-11-23 DIAGNOSIS — G311 Senile degeneration of brain, not elsewhere classified: Secondary | ICD-10-CM | POA: Diagnosis not present

## 2020-11-25 DIAGNOSIS — F0280 Dementia in other diseases classified elsewhere without behavioral disturbance: Secondary | ICD-10-CM | POA: Diagnosis not present

## 2020-11-25 DIAGNOSIS — R296 Repeated falls: Secondary | ICD-10-CM | POA: Diagnosis not present

## 2020-11-25 DIAGNOSIS — G311 Senile degeneration of brain, not elsewhere classified: Secondary | ICD-10-CM | POA: Diagnosis not present

## 2020-11-25 DIAGNOSIS — N183 Chronic kidney disease, stage 3 unspecified: Secondary | ICD-10-CM | POA: Diagnosis not present

## 2020-11-25 DIAGNOSIS — H918X3 Other specified hearing loss, bilateral: Secondary | ICD-10-CM | POA: Diagnosis not present

## 2020-11-25 DIAGNOSIS — E119 Type 2 diabetes mellitus without complications: Secondary | ICD-10-CM | POA: Diagnosis not present

## 2020-11-26 DIAGNOSIS — R296 Repeated falls: Secondary | ICD-10-CM | POA: Diagnosis not present

## 2020-11-26 DIAGNOSIS — E119 Type 2 diabetes mellitus without complications: Secondary | ICD-10-CM | POA: Diagnosis not present

## 2020-11-26 DIAGNOSIS — H918X3 Other specified hearing loss, bilateral: Secondary | ICD-10-CM | POA: Diagnosis not present

## 2020-11-26 DIAGNOSIS — F0280 Dementia in other diseases classified elsewhere without behavioral disturbance: Secondary | ICD-10-CM | POA: Diagnosis not present

## 2020-11-26 DIAGNOSIS — G311 Senile degeneration of brain, not elsewhere classified: Secondary | ICD-10-CM | POA: Diagnosis not present

## 2020-11-26 DIAGNOSIS — N183 Chronic kidney disease, stage 3 unspecified: Secondary | ICD-10-CM | POA: Diagnosis not present

## 2020-11-30 DIAGNOSIS — H918X3 Other specified hearing loss, bilateral: Secondary | ICD-10-CM | POA: Diagnosis not present

## 2020-11-30 DIAGNOSIS — N183 Chronic kidney disease, stage 3 unspecified: Secondary | ICD-10-CM | POA: Diagnosis not present

## 2020-11-30 DIAGNOSIS — R296 Repeated falls: Secondary | ICD-10-CM | POA: Diagnosis not present

## 2020-11-30 DIAGNOSIS — E119 Type 2 diabetes mellitus without complications: Secondary | ICD-10-CM | POA: Diagnosis not present

## 2020-11-30 DIAGNOSIS — F0280 Dementia in other diseases classified elsewhere without behavioral disturbance: Secondary | ICD-10-CM | POA: Diagnosis not present

## 2020-11-30 DIAGNOSIS — G311 Senile degeneration of brain, not elsewhere classified: Secondary | ICD-10-CM | POA: Diagnosis not present

## 2020-12-02 DIAGNOSIS — H918X3 Other specified hearing loss, bilateral: Secondary | ICD-10-CM | POA: Diagnosis not present

## 2020-12-02 DIAGNOSIS — Z23 Encounter for immunization: Secondary | ICD-10-CM | POA: Diagnosis not present

## 2020-12-02 DIAGNOSIS — R296 Repeated falls: Secondary | ICD-10-CM | POA: Diagnosis not present

## 2020-12-02 DIAGNOSIS — E119 Type 2 diabetes mellitus without complications: Secondary | ICD-10-CM | POA: Diagnosis not present

## 2020-12-02 DIAGNOSIS — F0280 Dementia in other diseases classified elsewhere without behavioral disturbance: Secondary | ICD-10-CM | POA: Diagnosis not present

## 2020-12-02 DIAGNOSIS — G311 Senile degeneration of brain, not elsewhere classified: Secondary | ICD-10-CM | POA: Diagnosis not present

## 2020-12-02 DIAGNOSIS — N183 Chronic kidney disease, stage 3 unspecified: Secondary | ICD-10-CM | POA: Diagnosis not present

## 2020-12-03 DIAGNOSIS — F0280 Dementia in other diseases classified elsewhere without behavioral disturbance: Secondary | ICD-10-CM | POA: Diagnosis not present

## 2020-12-03 DIAGNOSIS — H918X3 Other specified hearing loss, bilateral: Secondary | ICD-10-CM | POA: Diagnosis not present

## 2020-12-03 DIAGNOSIS — G311 Senile degeneration of brain, not elsewhere classified: Secondary | ICD-10-CM | POA: Diagnosis not present

## 2020-12-03 DIAGNOSIS — E119 Type 2 diabetes mellitus without complications: Secondary | ICD-10-CM | POA: Diagnosis not present

## 2020-12-03 DIAGNOSIS — R296 Repeated falls: Secondary | ICD-10-CM | POA: Diagnosis not present

## 2020-12-03 DIAGNOSIS — N183 Chronic kidney disease, stage 3 unspecified: Secondary | ICD-10-CM | POA: Diagnosis not present

## 2020-12-04 DIAGNOSIS — G311 Senile degeneration of brain, not elsewhere classified: Secondary | ICD-10-CM | POA: Diagnosis not present

## 2020-12-04 DIAGNOSIS — F0280 Dementia in other diseases classified elsewhere without behavioral disturbance: Secondary | ICD-10-CM | POA: Diagnosis not present

## 2020-12-04 DIAGNOSIS — N183 Chronic kidney disease, stage 3 unspecified: Secondary | ICD-10-CM | POA: Diagnosis not present

## 2020-12-04 DIAGNOSIS — E119 Type 2 diabetes mellitus without complications: Secondary | ICD-10-CM | POA: Diagnosis not present

## 2020-12-04 DIAGNOSIS — H918X3 Other specified hearing loss, bilateral: Secondary | ICD-10-CM | POA: Diagnosis not present

## 2020-12-04 DIAGNOSIS — R296 Repeated falls: Secondary | ICD-10-CM | POA: Diagnosis not present

## 2020-12-07 DIAGNOSIS — F0280 Dementia in other diseases classified elsewhere without behavioral disturbance: Secondary | ICD-10-CM | POA: Diagnosis not present

## 2020-12-07 DIAGNOSIS — N183 Chronic kidney disease, stage 3 unspecified: Secondary | ICD-10-CM | POA: Diagnosis not present

## 2020-12-07 DIAGNOSIS — H918X3 Other specified hearing loss, bilateral: Secondary | ICD-10-CM | POA: Diagnosis not present

## 2020-12-07 DIAGNOSIS — R296 Repeated falls: Secondary | ICD-10-CM | POA: Diagnosis not present

## 2020-12-07 DIAGNOSIS — E119 Type 2 diabetes mellitus without complications: Secondary | ICD-10-CM | POA: Diagnosis not present

## 2020-12-07 DIAGNOSIS — G311 Senile degeneration of brain, not elsewhere classified: Secondary | ICD-10-CM | POA: Diagnosis not present

## 2020-12-09 DIAGNOSIS — E119 Type 2 diabetes mellitus without complications: Secondary | ICD-10-CM | POA: Diagnosis not present

## 2020-12-09 DIAGNOSIS — N183 Chronic kidney disease, stage 3 unspecified: Secondary | ICD-10-CM | POA: Diagnosis not present

## 2020-12-09 DIAGNOSIS — H918X3 Other specified hearing loss, bilateral: Secondary | ICD-10-CM | POA: Diagnosis not present

## 2020-12-09 DIAGNOSIS — G311 Senile degeneration of brain, not elsewhere classified: Secondary | ICD-10-CM | POA: Diagnosis not present

## 2020-12-09 DIAGNOSIS — R296 Repeated falls: Secondary | ICD-10-CM | POA: Diagnosis not present

## 2020-12-09 DIAGNOSIS — F0280 Dementia in other diseases classified elsewhere without behavioral disturbance: Secondary | ICD-10-CM | POA: Diagnosis not present

## 2020-12-10 DIAGNOSIS — E119 Type 2 diabetes mellitus without complications: Secondary | ICD-10-CM | POA: Diagnosis not present

## 2020-12-10 DIAGNOSIS — R296 Repeated falls: Secondary | ICD-10-CM | POA: Diagnosis not present

## 2020-12-10 DIAGNOSIS — H918X3 Other specified hearing loss, bilateral: Secondary | ICD-10-CM | POA: Diagnosis not present

## 2020-12-10 DIAGNOSIS — G311 Senile degeneration of brain, not elsewhere classified: Secondary | ICD-10-CM | POA: Diagnosis not present

## 2020-12-10 DIAGNOSIS — N183 Chronic kidney disease, stage 3 unspecified: Secondary | ICD-10-CM | POA: Diagnosis not present

## 2020-12-10 DIAGNOSIS — F0280 Dementia in other diseases classified elsewhere without behavioral disturbance: Secondary | ICD-10-CM | POA: Diagnosis not present

## 2020-12-14 DIAGNOSIS — I679 Cerebrovascular disease, unspecified: Secondary | ICD-10-CM | POA: Diagnosis not present

## 2020-12-14 DIAGNOSIS — K5901 Slow transit constipation: Secondary | ICD-10-CM | POA: Diagnosis not present

## 2020-12-14 DIAGNOSIS — E119 Type 2 diabetes mellitus without complications: Secondary | ICD-10-CM | POA: Diagnosis not present

## 2020-12-14 DIAGNOSIS — H918X3 Other specified hearing loss, bilateral: Secondary | ICD-10-CM | POA: Diagnosis not present

## 2020-12-14 DIAGNOSIS — I150 Renovascular hypertension: Secondary | ICD-10-CM | POA: Diagnosis not present

## 2020-12-14 DIAGNOSIS — G311 Senile degeneration of brain, not elsewhere classified: Secondary | ICD-10-CM | POA: Diagnosis not present

## 2020-12-14 DIAGNOSIS — F39 Unspecified mood [affective] disorder: Secondary | ICD-10-CM | POA: Diagnosis not present

## 2020-12-14 DIAGNOSIS — M1990 Unspecified osteoarthritis, unspecified site: Secondary | ICD-10-CM | POA: Diagnosis not present

## 2020-12-14 DIAGNOSIS — H409 Unspecified glaucoma: Secondary | ICD-10-CM | POA: Diagnosis not present

## 2020-12-14 DIAGNOSIS — D638 Anemia in other chronic diseases classified elsewhere: Secondary | ICD-10-CM | POA: Diagnosis not present

## 2020-12-14 DIAGNOSIS — F809 Developmental disorder of speech and language, unspecified: Secondary | ICD-10-CM | POA: Diagnosis not present

## 2020-12-14 DIAGNOSIS — N184 Chronic kidney disease, stage 4 (severe): Secondary | ICD-10-CM | POA: Diagnosis not present

## 2020-12-14 DIAGNOSIS — N183 Chronic kidney disease, stage 3 unspecified: Secondary | ICD-10-CM | POA: Diagnosis not present

## 2020-12-14 DIAGNOSIS — F0280 Dementia in other diseases classified elsewhere without behavioral disturbance: Secondary | ICD-10-CM | POA: Diagnosis not present

## 2020-12-14 DIAGNOSIS — R296 Repeated falls: Secondary | ICD-10-CM | POA: Diagnosis not present

## 2020-12-16 DIAGNOSIS — H918X3 Other specified hearing loss, bilateral: Secondary | ICD-10-CM | POA: Diagnosis not present

## 2020-12-16 DIAGNOSIS — E119 Type 2 diabetes mellitus without complications: Secondary | ICD-10-CM | POA: Diagnosis not present

## 2020-12-16 DIAGNOSIS — R296 Repeated falls: Secondary | ICD-10-CM | POA: Diagnosis not present

## 2020-12-16 DIAGNOSIS — G311 Senile degeneration of brain, not elsewhere classified: Secondary | ICD-10-CM | POA: Diagnosis not present

## 2020-12-16 DIAGNOSIS — F0280 Dementia in other diseases classified elsewhere without behavioral disturbance: Secondary | ICD-10-CM | POA: Diagnosis not present

## 2020-12-16 DIAGNOSIS — N183 Chronic kidney disease, stage 3 unspecified: Secondary | ICD-10-CM | POA: Diagnosis not present

## 2020-12-17 DIAGNOSIS — F0280 Dementia in other diseases classified elsewhere without behavioral disturbance: Secondary | ICD-10-CM | POA: Diagnosis not present

## 2020-12-17 DIAGNOSIS — R296 Repeated falls: Secondary | ICD-10-CM | POA: Diagnosis not present

## 2020-12-17 DIAGNOSIS — E119 Type 2 diabetes mellitus without complications: Secondary | ICD-10-CM | POA: Diagnosis not present

## 2020-12-17 DIAGNOSIS — G311 Senile degeneration of brain, not elsewhere classified: Secondary | ICD-10-CM | POA: Diagnosis not present

## 2020-12-17 DIAGNOSIS — H918X3 Other specified hearing loss, bilateral: Secondary | ICD-10-CM | POA: Diagnosis not present

## 2020-12-17 DIAGNOSIS — N183 Chronic kidney disease, stage 3 unspecified: Secondary | ICD-10-CM | POA: Diagnosis not present

## 2020-12-18 DIAGNOSIS — R296 Repeated falls: Secondary | ICD-10-CM | POA: Diagnosis not present

## 2020-12-18 DIAGNOSIS — F0280 Dementia in other diseases classified elsewhere without behavioral disturbance: Secondary | ICD-10-CM | POA: Diagnosis not present

## 2020-12-18 DIAGNOSIS — E119 Type 2 diabetes mellitus without complications: Secondary | ICD-10-CM | POA: Diagnosis not present

## 2020-12-18 DIAGNOSIS — H918X3 Other specified hearing loss, bilateral: Secondary | ICD-10-CM | POA: Diagnosis not present

## 2020-12-18 DIAGNOSIS — G311 Senile degeneration of brain, not elsewhere classified: Secondary | ICD-10-CM | POA: Diagnosis not present

## 2020-12-18 DIAGNOSIS — N183 Chronic kidney disease, stage 3 unspecified: Secondary | ICD-10-CM | POA: Diagnosis not present

## 2020-12-21 DIAGNOSIS — F0280 Dementia in other diseases classified elsewhere without behavioral disturbance: Secondary | ICD-10-CM | POA: Diagnosis not present

## 2020-12-21 DIAGNOSIS — N183 Chronic kidney disease, stage 3 unspecified: Secondary | ICD-10-CM | POA: Diagnosis not present

## 2020-12-21 DIAGNOSIS — H918X3 Other specified hearing loss, bilateral: Secondary | ICD-10-CM | POA: Diagnosis not present

## 2020-12-21 DIAGNOSIS — R296 Repeated falls: Secondary | ICD-10-CM | POA: Diagnosis not present

## 2020-12-21 DIAGNOSIS — G311 Senile degeneration of brain, not elsewhere classified: Secondary | ICD-10-CM | POA: Diagnosis not present

## 2020-12-21 DIAGNOSIS — E119 Type 2 diabetes mellitus without complications: Secondary | ICD-10-CM | POA: Diagnosis not present

## 2020-12-23 DIAGNOSIS — F0280 Dementia in other diseases classified elsewhere without behavioral disturbance: Secondary | ICD-10-CM | POA: Diagnosis not present

## 2020-12-23 DIAGNOSIS — H918X3 Other specified hearing loss, bilateral: Secondary | ICD-10-CM | POA: Diagnosis not present

## 2020-12-23 DIAGNOSIS — R296 Repeated falls: Secondary | ICD-10-CM | POA: Diagnosis not present

## 2020-12-23 DIAGNOSIS — G311 Senile degeneration of brain, not elsewhere classified: Secondary | ICD-10-CM | POA: Diagnosis not present

## 2020-12-23 DIAGNOSIS — E119 Type 2 diabetes mellitus without complications: Secondary | ICD-10-CM | POA: Diagnosis not present

## 2020-12-23 DIAGNOSIS — N183 Chronic kidney disease, stage 3 unspecified: Secondary | ICD-10-CM | POA: Diagnosis not present

## 2020-12-24 DIAGNOSIS — Z515 Encounter for palliative care: Secondary | ICD-10-CM | POA: Diagnosis not present

## 2020-12-24 DIAGNOSIS — E119 Type 2 diabetes mellitus without complications: Secondary | ICD-10-CM | POA: Diagnosis not present

## 2020-12-24 DIAGNOSIS — R54 Age-related physical debility: Secondary | ICD-10-CM | POA: Diagnosis not present

## 2020-12-24 DIAGNOSIS — G311 Senile degeneration of brain, not elsewhere classified: Secondary | ICD-10-CM | POA: Diagnosis not present

## 2020-12-24 DIAGNOSIS — H918X3 Other specified hearing loss, bilateral: Secondary | ICD-10-CM | POA: Diagnosis not present

## 2020-12-24 DIAGNOSIS — R296 Repeated falls: Secondary | ICD-10-CM | POA: Diagnosis not present

## 2020-12-24 DIAGNOSIS — N183 Chronic kidney disease, stage 3 unspecified: Secondary | ICD-10-CM | POA: Diagnosis not present

## 2020-12-24 DIAGNOSIS — F0280 Dementia in other diseases classified elsewhere without behavioral disturbance: Secondary | ICD-10-CM | POA: Diagnosis not present

## 2020-12-25 DIAGNOSIS — H918X3 Other specified hearing loss, bilateral: Secondary | ICD-10-CM | POA: Diagnosis not present

## 2020-12-25 DIAGNOSIS — N183 Chronic kidney disease, stage 3 unspecified: Secondary | ICD-10-CM | POA: Diagnosis not present

## 2020-12-25 DIAGNOSIS — R296 Repeated falls: Secondary | ICD-10-CM | POA: Diagnosis not present

## 2020-12-25 DIAGNOSIS — E119 Type 2 diabetes mellitus without complications: Secondary | ICD-10-CM | POA: Diagnosis not present

## 2020-12-25 DIAGNOSIS — F0280 Dementia in other diseases classified elsewhere without behavioral disturbance: Secondary | ICD-10-CM | POA: Diagnosis not present

## 2020-12-25 DIAGNOSIS — G311 Senile degeneration of brain, not elsewhere classified: Secondary | ICD-10-CM | POA: Diagnosis not present

## 2020-12-31 DIAGNOSIS — G311 Senile degeneration of brain, not elsewhere classified: Secondary | ICD-10-CM | POA: Diagnosis not present

## 2020-12-31 DIAGNOSIS — F0280 Dementia in other diseases classified elsewhere without behavioral disturbance: Secondary | ICD-10-CM | POA: Diagnosis not present

## 2020-12-31 DIAGNOSIS — N183 Chronic kidney disease, stage 3 unspecified: Secondary | ICD-10-CM | POA: Diagnosis not present

## 2020-12-31 DIAGNOSIS — E119 Type 2 diabetes mellitus without complications: Secondary | ICD-10-CM | POA: Diagnosis not present

## 2020-12-31 DIAGNOSIS — H918X3 Other specified hearing loss, bilateral: Secondary | ICD-10-CM | POA: Diagnosis not present

## 2020-12-31 DIAGNOSIS — R296 Repeated falls: Secondary | ICD-10-CM | POA: Diagnosis not present

## 2021-01-04 DIAGNOSIS — R296 Repeated falls: Secondary | ICD-10-CM | POA: Diagnosis not present

## 2021-01-04 DIAGNOSIS — N183 Chronic kidney disease, stage 3 unspecified: Secondary | ICD-10-CM | POA: Diagnosis not present

## 2021-01-04 DIAGNOSIS — G311 Senile degeneration of brain, not elsewhere classified: Secondary | ICD-10-CM | POA: Diagnosis not present

## 2021-01-04 DIAGNOSIS — E119 Type 2 diabetes mellitus without complications: Secondary | ICD-10-CM | POA: Diagnosis not present

## 2021-01-04 DIAGNOSIS — H918X3 Other specified hearing loss, bilateral: Secondary | ICD-10-CM | POA: Diagnosis not present

## 2021-01-04 DIAGNOSIS — F0280 Dementia in other diseases classified elsewhere without behavioral disturbance: Secondary | ICD-10-CM | POA: Diagnosis not present

## 2021-01-06 DIAGNOSIS — E119 Type 2 diabetes mellitus without complications: Secondary | ICD-10-CM | POA: Diagnosis not present

## 2021-01-06 DIAGNOSIS — G311 Senile degeneration of brain, not elsewhere classified: Secondary | ICD-10-CM | POA: Diagnosis not present

## 2021-01-06 DIAGNOSIS — N183 Chronic kidney disease, stage 3 unspecified: Secondary | ICD-10-CM | POA: Diagnosis not present

## 2021-01-06 DIAGNOSIS — F0280 Dementia in other diseases classified elsewhere without behavioral disturbance: Secondary | ICD-10-CM | POA: Diagnosis not present

## 2021-01-06 DIAGNOSIS — R296 Repeated falls: Secondary | ICD-10-CM | POA: Diagnosis not present

## 2021-01-06 DIAGNOSIS — H918X3 Other specified hearing loss, bilateral: Secondary | ICD-10-CM | POA: Diagnosis not present

## 2021-01-07 DIAGNOSIS — N183 Chronic kidney disease, stage 3 unspecified: Secondary | ICD-10-CM | POA: Diagnosis not present

## 2021-01-07 DIAGNOSIS — H918X3 Other specified hearing loss, bilateral: Secondary | ICD-10-CM | POA: Diagnosis not present

## 2021-01-07 DIAGNOSIS — G311 Senile degeneration of brain, not elsewhere classified: Secondary | ICD-10-CM | POA: Diagnosis not present

## 2021-01-07 DIAGNOSIS — E119 Type 2 diabetes mellitus without complications: Secondary | ICD-10-CM | POA: Diagnosis not present

## 2021-01-07 DIAGNOSIS — F0280 Dementia in other diseases classified elsewhere without behavioral disturbance: Secondary | ICD-10-CM | POA: Diagnosis not present

## 2021-01-07 DIAGNOSIS — R296 Repeated falls: Secondary | ICD-10-CM | POA: Diagnosis not present

## 2021-01-08 DIAGNOSIS — F0280 Dementia in other diseases classified elsewhere without behavioral disturbance: Secondary | ICD-10-CM | POA: Diagnosis not present

## 2021-01-08 DIAGNOSIS — E119 Type 2 diabetes mellitus without complications: Secondary | ICD-10-CM | POA: Diagnosis not present

## 2021-01-08 DIAGNOSIS — N183 Chronic kidney disease, stage 3 unspecified: Secondary | ICD-10-CM | POA: Diagnosis not present

## 2021-01-08 DIAGNOSIS — H918X3 Other specified hearing loss, bilateral: Secondary | ICD-10-CM | POA: Diagnosis not present

## 2021-01-08 DIAGNOSIS — R296 Repeated falls: Secondary | ICD-10-CM | POA: Diagnosis not present

## 2021-01-08 DIAGNOSIS — G311 Senile degeneration of brain, not elsewhere classified: Secondary | ICD-10-CM | POA: Diagnosis not present

## 2021-01-14 DIAGNOSIS — H918X3 Other specified hearing loss, bilateral: Secondary | ICD-10-CM | POA: Diagnosis not present

## 2021-01-14 DIAGNOSIS — R296 Repeated falls: Secondary | ICD-10-CM | POA: Diagnosis not present

## 2021-01-14 DIAGNOSIS — G311 Senile degeneration of brain, not elsewhere classified: Secondary | ICD-10-CM | POA: Diagnosis not present

## 2021-01-14 DIAGNOSIS — E119 Type 2 diabetes mellitus without complications: Secondary | ICD-10-CM | POA: Diagnosis not present

## 2021-01-14 DIAGNOSIS — F0280 Dementia in other diseases classified elsewhere without behavioral disturbance: Secondary | ICD-10-CM | POA: Diagnosis not present

## 2021-01-14 DIAGNOSIS — N183 Chronic kidney disease, stage 3 unspecified: Secondary | ICD-10-CM | POA: Diagnosis not present

## 2021-01-15 DIAGNOSIS — H918X3 Other specified hearing loss, bilateral: Secondary | ICD-10-CM | POA: Diagnosis not present

## 2021-01-15 DIAGNOSIS — G311 Senile degeneration of brain, not elsewhere classified: Secondary | ICD-10-CM | POA: Diagnosis not present

## 2021-01-15 DIAGNOSIS — E119 Type 2 diabetes mellitus without complications: Secondary | ICD-10-CM | POA: Diagnosis not present

## 2021-01-15 DIAGNOSIS — F0280 Dementia in other diseases classified elsewhere without behavioral disturbance: Secondary | ICD-10-CM | POA: Diagnosis not present

## 2021-01-15 DIAGNOSIS — N183 Chronic kidney disease, stage 3 unspecified: Secondary | ICD-10-CM | POA: Diagnosis not present

## 2021-01-15 DIAGNOSIS — R296 Repeated falls: Secondary | ICD-10-CM | POA: Diagnosis not present

## 2021-01-19 DIAGNOSIS — G311 Senile degeneration of brain, not elsewhere classified: Secondary | ICD-10-CM | POA: Diagnosis not present

## 2021-01-19 DIAGNOSIS — R296 Repeated falls: Secondary | ICD-10-CM | POA: Diagnosis not present

## 2021-01-19 DIAGNOSIS — E119 Type 2 diabetes mellitus without complications: Secondary | ICD-10-CM | POA: Diagnosis not present

## 2021-01-19 DIAGNOSIS — H918X3 Other specified hearing loss, bilateral: Secondary | ICD-10-CM | POA: Diagnosis not present

## 2021-01-19 DIAGNOSIS — N183 Chronic kidney disease, stage 3 unspecified: Secondary | ICD-10-CM | POA: Diagnosis not present

## 2021-01-19 DIAGNOSIS — F0280 Dementia in other diseases classified elsewhere without behavioral disturbance: Secondary | ICD-10-CM | POA: Diagnosis not present

## 2021-01-21 DIAGNOSIS — F0280 Dementia in other diseases classified elsewhere without behavioral disturbance: Secondary | ICD-10-CM | POA: Diagnosis not present

## 2021-01-21 DIAGNOSIS — H918X3 Other specified hearing loss, bilateral: Secondary | ICD-10-CM | POA: Diagnosis not present

## 2021-01-21 DIAGNOSIS — E119 Type 2 diabetes mellitus without complications: Secondary | ICD-10-CM | POA: Diagnosis not present

## 2021-01-21 DIAGNOSIS — R296 Repeated falls: Secondary | ICD-10-CM | POA: Diagnosis not present

## 2021-01-21 DIAGNOSIS — G311 Senile degeneration of brain, not elsewhere classified: Secondary | ICD-10-CM | POA: Diagnosis not present

## 2021-01-21 DIAGNOSIS — N183 Chronic kidney disease, stage 3 unspecified: Secondary | ICD-10-CM | POA: Diagnosis not present

## 2021-01-23 DIAGNOSIS — G311 Senile degeneration of brain, not elsewhere classified: Secondary | ICD-10-CM | POA: Diagnosis not present

## 2021-01-23 DIAGNOSIS — N183 Chronic kidney disease, stage 3 unspecified: Secondary | ICD-10-CM | POA: Diagnosis not present

## 2021-01-23 DIAGNOSIS — E119 Type 2 diabetes mellitus without complications: Secondary | ICD-10-CM | POA: Diagnosis not present

## 2021-01-23 DIAGNOSIS — H918X3 Other specified hearing loss, bilateral: Secondary | ICD-10-CM | POA: Diagnosis not present

## 2021-01-23 DIAGNOSIS — R296 Repeated falls: Secondary | ICD-10-CM | POA: Diagnosis not present

## 2021-01-23 DIAGNOSIS — Z515 Encounter for palliative care: Secondary | ICD-10-CM | POA: Diagnosis not present

## 2021-01-23 DIAGNOSIS — R54 Age-related physical debility: Secondary | ICD-10-CM | POA: Diagnosis not present

## 2021-01-23 DIAGNOSIS — F0280 Dementia in other diseases classified elsewhere without behavioral disturbance: Secondary | ICD-10-CM | POA: Diagnosis not present

## 2021-01-25 DIAGNOSIS — H918X3 Other specified hearing loss, bilateral: Secondary | ICD-10-CM | POA: Diagnosis not present

## 2021-01-25 DIAGNOSIS — G311 Senile degeneration of brain, not elsewhere classified: Secondary | ICD-10-CM | POA: Diagnosis not present

## 2021-01-25 DIAGNOSIS — E119 Type 2 diabetes mellitus without complications: Secondary | ICD-10-CM | POA: Diagnosis not present

## 2021-01-25 DIAGNOSIS — R296 Repeated falls: Secondary | ICD-10-CM | POA: Diagnosis not present

## 2021-01-25 DIAGNOSIS — N183 Chronic kidney disease, stage 3 unspecified: Secondary | ICD-10-CM | POA: Diagnosis not present

## 2021-01-25 DIAGNOSIS — F0280 Dementia in other diseases classified elsewhere without behavioral disturbance: Secondary | ICD-10-CM | POA: Diagnosis not present

## 2021-01-26 DIAGNOSIS — F0280 Dementia in other diseases classified elsewhere without behavioral disturbance: Secondary | ICD-10-CM | POA: Diagnosis not present

## 2021-01-26 DIAGNOSIS — R296 Repeated falls: Secondary | ICD-10-CM | POA: Diagnosis not present

## 2021-01-26 DIAGNOSIS — H918X3 Other specified hearing loss, bilateral: Secondary | ICD-10-CM | POA: Diagnosis not present

## 2021-01-26 DIAGNOSIS — G311 Senile degeneration of brain, not elsewhere classified: Secondary | ICD-10-CM | POA: Diagnosis not present

## 2021-01-26 DIAGNOSIS — N183 Chronic kidney disease, stage 3 unspecified: Secondary | ICD-10-CM | POA: Diagnosis not present

## 2021-01-26 DIAGNOSIS — E119 Type 2 diabetes mellitus without complications: Secondary | ICD-10-CM | POA: Diagnosis not present

## 2021-01-29 DIAGNOSIS — E119 Type 2 diabetes mellitus without complications: Secondary | ICD-10-CM | POA: Diagnosis not present

## 2021-01-29 DIAGNOSIS — F0280 Dementia in other diseases classified elsewhere without behavioral disturbance: Secondary | ICD-10-CM | POA: Diagnosis not present

## 2021-01-29 DIAGNOSIS — N183 Chronic kidney disease, stage 3 unspecified: Secondary | ICD-10-CM | POA: Diagnosis not present

## 2021-01-29 DIAGNOSIS — G311 Senile degeneration of brain, not elsewhere classified: Secondary | ICD-10-CM | POA: Diagnosis not present

## 2021-01-29 DIAGNOSIS — H918X3 Other specified hearing loss, bilateral: Secondary | ICD-10-CM | POA: Diagnosis not present

## 2021-01-29 DIAGNOSIS — R296 Repeated falls: Secondary | ICD-10-CM | POA: Diagnosis not present

## 2021-02-02 DIAGNOSIS — N183 Chronic kidney disease, stage 3 unspecified: Secondary | ICD-10-CM | POA: Diagnosis not present

## 2021-02-02 DIAGNOSIS — H918X3 Other specified hearing loss, bilateral: Secondary | ICD-10-CM | POA: Diagnosis not present

## 2021-02-02 DIAGNOSIS — F0280 Dementia in other diseases classified elsewhere without behavioral disturbance: Secondary | ICD-10-CM | POA: Diagnosis not present

## 2021-02-02 DIAGNOSIS — E119 Type 2 diabetes mellitus without complications: Secondary | ICD-10-CM | POA: Diagnosis not present

## 2021-02-02 DIAGNOSIS — G311 Senile degeneration of brain, not elsewhere classified: Secondary | ICD-10-CM | POA: Diagnosis not present

## 2021-02-02 DIAGNOSIS — R296 Repeated falls: Secondary | ICD-10-CM | POA: Diagnosis not present

## 2021-02-04 DIAGNOSIS — H918X3 Other specified hearing loss, bilateral: Secondary | ICD-10-CM | POA: Diagnosis not present

## 2021-02-04 DIAGNOSIS — E119 Type 2 diabetes mellitus without complications: Secondary | ICD-10-CM | POA: Diagnosis not present

## 2021-02-04 DIAGNOSIS — F0280 Dementia in other diseases classified elsewhere without behavioral disturbance: Secondary | ICD-10-CM | POA: Diagnosis not present

## 2021-02-04 DIAGNOSIS — R296 Repeated falls: Secondary | ICD-10-CM | POA: Diagnosis not present

## 2021-02-04 DIAGNOSIS — N183 Chronic kidney disease, stage 3 unspecified: Secondary | ICD-10-CM | POA: Diagnosis not present

## 2021-02-04 DIAGNOSIS — G311 Senile degeneration of brain, not elsewhere classified: Secondary | ICD-10-CM | POA: Diagnosis not present

## 2021-02-05 DIAGNOSIS — N183 Chronic kidney disease, stage 3 unspecified: Secondary | ICD-10-CM | POA: Diagnosis not present

## 2021-02-05 DIAGNOSIS — H918X3 Other specified hearing loss, bilateral: Secondary | ICD-10-CM | POA: Diagnosis not present

## 2021-02-05 DIAGNOSIS — G311 Senile degeneration of brain, not elsewhere classified: Secondary | ICD-10-CM | POA: Diagnosis not present

## 2021-02-05 DIAGNOSIS — R296 Repeated falls: Secondary | ICD-10-CM | POA: Diagnosis not present

## 2021-02-05 DIAGNOSIS — E119 Type 2 diabetes mellitus without complications: Secondary | ICD-10-CM | POA: Diagnosis not present

## 2021-02-05 DIAGNOSIS — F0280 Dementia in other diseases classified elsewhere without behavioral disturbance: Secondary | ICD-10-CM | POA: Diagnosis not present

## 2021-02-08 DIAGNOSIS — N183 Chronic kidney disease, stage 3 unspecified: Secondary | ICD-10-CM | POA: Diagnosis not present

## 2021-02-08 DIAGNOSIS — E119 Type 2 diabetes mellitus without complications: Secondary | ICD-10-CM | POA: Diagnosis not present

## 2021-02-08 DIAGNOSIS — F0280 Dementia in other diseases classified elsewhere without behavioral disturbance: Secondary | ICD-10-CM | POA: Diagnosis not present

## 2021-02-08 DIAGNOSIS — R296 Repeated falls: Secondary | ICD-10-CM | POA: Diagnosis not present

## 2021-02-08 DIAGNOSIS — G311 Senile degeneration of brain, not elsewhere classified: Secondary | ICD-10-CM | POA: Diagnosis not present

## 2021-02-08 DIAGNOSIS — H918X3 Other specified hearing loss, bilateral: Secondary | ICD-10-CM | POA: Diagnosis not present

## 2021-02-09 DIAGNOSIS — E119 Type 2 diabetes mellitus without complications: Secondary | ICD-10-CM | POA: Diagnosis not present

## 2021-02-09 DIAGNOSIS — H918X3 Other specified hearing loss, bilateral: Secondary | ICD-10-CM | POA: Diagnosis not present

## 2021-02-09 DIAGNOSIS — N183 Chronic kidney disease, stage 3 unspecified: Secondary | ICD-10-CM | POA: Diagnosis not present

## 2021-02-09 DIAGNOSIS — R296 Repeated falls: Secondary | ICD-10-CM | POA: Diagnosis not present

## 2021-02-09 DIAGNOSIS — G311 Senile degeneration of brain, not elsewhere classified: Secondary | ICD-10-CM | POA: Diagnosis not present

## 2021-02-09 DIAGNOSIS — F0280 Dementia in other diseases classified elsewhere without behavioral disturbance: Secondary | ICD-10-CM | POA: Diagnosis not present

## 2021-02-11 DIAGNOSIS — G311 Senile degeneration of brain, not elsewhere classified: Secondary | ICD-10-CM | POA: Diagnosis not present

## 2021-02-11 DIAGNOSIS — F0280 Dementia in other diseases classified elsewhere without behavioral disturbance: Secondary | ICD-10-CM | POA: Diagnosis not present

## 2021-02-11 DIAGNOSIS — E119 Type 2 diabetes mellitus without complications: Secondary | ICD-10-CM | POA: Diagnosis not present

## 2021-02-11 DIAGNOSIS — N183 Chronic kidney disease, stage 3 unspecified: Secondary | ICD-10-CM | POA: Diagnosis not present

## 2021-02-11 DIAGNOSIS — H918X3 Other specified hearing loss, bilateral: Secondary | ICD-10-CM | POA: Diagnosis not present

## 2021-02-11 DIAGNOSIS — R296 Repeated falls: Secondary | ICD-10-CM | POA: Diagnosis not present

## 2021-02-15 DIAGNOSIS — M1990 Unspecified osteoarthritis, unspecified site: Secondary | ICD-10-CM | POA: Diagnosis not present

## 2021-02-15 DIAGNOSIS — I679 Cerebrovascular disease, unspecified: Secondary | ICD-10-CM | POA: Diagnosis not present

## 2021-02-15 DIAGNOSIS — F39 Unspecified mood [affective] disorder: Secondary | ICD-10-CM | POA: Diagnosis not present

## 2021-02-15 DIAGNOSIS — D638 Anemia in other chronic diseases classified elsewhere: Secondary | ICD-10-CM | POA: Diagnosis not present

## 2021-02-15 DIAGNOSIS — F01518 Vascular dementia, unspecified severity, with other behavioral disturbance: Secondary | ICD-10-CM | POA: Diagnosis not present

## 2021-02-16 DIAGNOSIS — E119 Type 2 diabetes mellitus without complications: Secondary | ICD-10-CM | POA: Diagnosis not present

## 2021-02-16 DIAGNOSIS — H918X3 Other specified hearing loss, bilateral: Secondary | ICD-10-CM | POA: Diagnosis not present

## 2021-02-16 DIAGNOSIS — F0280 Dementia in other diseases classified elsewhere without behavioral disturbance: Secondary | ICD-10-CM | POA: Diagnosis not present

## 2021-02-16 DIAGNOSIS — N183 Chronic kidney disease, stage 3 unspecified: Secondary | ICD-10-CM | POA: Diagnosis not present

## 2021-02-16 DIAGNOSIS — G311 Senile degeneration of brain, not elsewhere classified: Secondary | ICD-10-CM | POA: Diagnosis not present

## 2021-02-16 DIAGNOSIS — R296 Repeated falls: Secondary | ICD-10-CM | POA: Diagnosis not present

## 2021-02-18 DIAGNOSIS — F0280 Dementia in other diseases classified elsewhere without behavioral disturbance: Secondary | ICD-10-CM | POA: Diagnosis not present

## 2021-02-18 DIAGNOSIS — N183 Chronic kidney disease, stage 3 unspecified: Secondary | ICD-10-CM | POA: Diagnosis not present

## 2021-02-18 DIAGNOSIS — E119 Type 2 diabetes mellitus without complications: Secondary | ICD-10-CM | POA: Diagnosis not present

## 2021-02-18 DIAGNOSIS — R296 Repeated falls: Secondary | ICD-10-CM | POA: Diagnosis not present

## 2021-02-18 DIAGNOSIS — G311 Senile degeneration of brain, not elsewhere classified: Secondary | ICD-10-CM | POA: Diagnosis not present

## 2021-02-18 DIAGNOSIS — H918X3 Other specified hearing loss, bilateral: Secondary | ICD-10-CM | POA: Diagnosis not present

## 2021-02-19 DIAGNOSIS — E119 Type 2 diabetes mellitus without complications: Secondary | ICD-10-CM | POA: Diagnosis not present

## 2021-02-19 DIAGNOSIS — H918X3 Other specified hearing loss, bilateral: Secondary | ICD-10-CM | POA: Diagnosis not present

## 2021-02-19 DIAGNOSIS — G311 Senile degeneration of brain, not elsewhere classified: Secondary | ICD-10-CM | POA: Diagnosis not present

## 2021-02-19 DIAGNOSIS — N183 Chronic kidney disease, stage 3 unspecified: Secondary | ICD-10-CM | POA: Diagnosis not present

## 2021-02-19 DIAGNOSIS — R296 Repeated falls: Secondary | ICD-10-CM | POA: Diagnosis not present

## 2021-02-19 DIAGNOSIS — F0280 Dementia in other diseases classified elsewhere without behavioral disturbance: Secondary | ICD-10-CM | POA: Diagnosis not present

## 2021-02-23 DIAGNOSIS — R54 Age-related physical debility: Secondary | ICD-10-CM | POA: Diagnosis not present

## 2021-02-23 DIAGNOSIS — F0280 Dementia in other diseases classified elsewhere without behavioral disturbance: Secondary | ICD-10-CM | POA: Diagnosis not present

## 2021-02-23 DIAGNOSIS — Z515 Encounter for palliative care: Secondary | ICD-10-CM | POA: Diagnosis not present

## 2021-02-23 DIAGNOSIS — H918X3 Other specified hearing loss, bilateral: Secondary | ICD-10-CM | POA: Diagnosis not present

## 2021-02-23 DIAGNOSIS — N183 Chronic kidney disease, stage 3 unspecified: Secondary | ICD-10-CM | POA: Diagnosis not present

## 2021-02-23 DIAGNOSIS — E119 Type 2 diabetes mellitus without complications: Secondary | ICD-10-CM | POA: Diagnosis not present

## 2021-02-23 DIAGNOSIS — R296 Repeated falls: Secondary | ICD-10-CM | POA: Diagnosis not present

## 2021-02-23 DIAGNOSIS — G311 Senile degeneration of brain, not elsewhere classified: Secondary | ICD-10-CM | POA: Diagnosis not present

## 2021-02-25 DIAGNOSIS — H918X3 Other specified hearing loss, bilateral: Secondary | ICD-10-CM | POA: Diagnosis not present

## 2021-02-25 DIAGNOSIS — G311 Senile degeneration of brain, not elsewhere classified: Secondary | ICD-10-CM | POA: Diagnosis not present

## 2021-02-25 DIAGNOSIS — N183 Chronic kidney disease, stage 3 unspecified: Secondary | ICD-10-CM | POA: Diagnosis not present

## 2021-02-25 DIAGNOSIS — E119 Type 2 diabetes mellitus without complications: Secondary | ICD-10-CM | POA: Diagnosis not present

## 2021-02-25 DIAGNOSIS — R296 Repeated falls: Secondary | ICD-10-CM | POA: Diagnosis not present

## 2021-02-25 DIAGNOSIS — F0280 Dementia in other diseases classified elsewhere without behavioral disturbance: Secondary | ICD-10-CM | POA: Diagnosis not present

## 2021-03-02 DIAGNOSIS — H918X3 Other specified hearing loss, bilateral: Secondary | ICD-10-CM | POA: Diagnosis not present

## 2021-03-02 DIAGNOSIS — R296 Repeated falls: Secondary | ICD-10-CM | POA: Diagnosis not present

## 2021-03-02 DIAGNOSIS — G311 Senile degeneration of brain, not elsewhere classified: Secondary | ICD-10-CM | POA: Diagnosis not present

## 2021-03-02 DIAGNOSIS — E119 Type 2 diabetes mellitus without complications: Secondary | ICD-10-CM | POA: Diagnosis not present

## 2021-03-02 DIAGNOSIS — N183 Chronic kidney disease, stage 3 unspecified: Secondary | ICD-10-CM | POA: Diagnosis not present

## 2021-03-02 DIAGNOSIS — F0280 Dementia in other diseases classified elsewhere without behavioral disturbance: Secondary | ICD-10-CM | POA: Diagnosis not present

## 2021-03-03 DIAGNOSIS — H918X3 Other specified hearing loss, bilateral: Secondary | ICD-10-CM | POA: Diagnosis not present

## 2021-03-03 DIAGNOSIS — N183 Chronic kidney disease, stage 3 unspecified: Secondary | ICD-10-CM | POA: Diagnosis not present

## 2021-03-03 DIAGNOSIS — R296 Repeated falls: Secondary | ICD-10-CM | POA: Diagnosis not present

## 2021-03-03 DIAGNOSIS — G311 Senile degeneration of brain, not elsewhere classified: Secondary | ICD-10-CM | POA: Diagnosis not present

## 2021-03-03 DIAGNOSIS — F0280 Dementia in other diseases classified elsewhere without behavioral disturbance: Secondary | ICD-10-CM | POA: Diagnosis not present

## 2021-03-03 DIAGNOSIS — E119 Type 2 diabetes mellitus without complications: Secondary | ICD-10-CM | POA: Diagnosis not present

## 2021-03-04 DIAGNOSIS — F0280 Dementia in other diseases classified elsewhere without behavioral disturbance: Secondary | ICD-10-CM | POA: Diagnosis not present

## 2021-03-04 DIAGNOSIS — E119 Type 2 diabetes mellitus without complications: Secondary | ICD-10-CM | POA: Diagnosis not present

## 2021-03-04 DIAGNOSIS — H918X3 Other specified hearing loss, bilateral: Secondary | ICD-10-CM | POA: Diagnosis not present

## 2021-03-04 DIAGNOSIS — G311 Senile degeneration of brain, not elsewhere classified: Secondary | ICD-10-CM | POA: Diagnosis not present

## 2021-03-04 DIAGNOSIS — N183 Chronic kidney disease, stage 3 unspecified: Secondary | ICD-10-CM | POA: Diagnosis not present

## 2021-03-04 DIAGNOSIS — R296 Repeated falls: Secondary | ICD-10-CM | POA: Diagnosis not present

## 2021-03-09 DIAGNOSIS — F0280 Dementia in other diseases classified elsewhere without behavioral disturbance: Secondary | ICD-10-CM | POA: Diagnosis not present

## 2021-03-09 DIAGNOSIS — E119 Type 2 diabetes mellitus without complications: Secondary | ICD-10-CM | POA: Diagnosis not present

## 2021-03-09 DIAGNOSIS — G311 Senile degeneration of brain, not elsewhere classified: Secondary | ICD-10-CM | POA: Diagnosis not present

## 2021-03-09 DIAGNOSIS — N183 Chronic kidney disease, stage 3 unspecified: Secondary | ICD-10-CM | POA: Diagnosis not present

## 2021-03-09 DIAGNOSIS — H918X3 Other specified hearing loss, bilateral: Secondary | ICD-10-CM | POA: Diagnosis not present

## 2021-03-09 DIAGNOSIS — R296 Repeated falls: Secondary | ICD-10-CM | POA: Diagnosis not present

## 2021-03-10 DIAGNOSIS — E119 Type 2 diabetes mellitus without complications: Secondary | ICD-10-CM | POA: Diagnosis not present

## 2021-03-10 DIAGNOSIS — F0280 Dementia in other diseases classified elsewhere without behavioral disturbance: Secondary | ICD-10-CM | POA: Diagnosis not present

## 2021-03-10 DIAGNOSIS — G311 Senile degeneration of brain, not elsewhere classified: Secondary | ICD-10-CM | POA: Diagnosis not present

## 2021-03-10 DIAGNOSIS — H918X3 Other specified hearing loss, bilateral: Secondary | ICD-10-CM | POA: Diagnosis not present

## 2021-03-10 DIAGNOSIS — N183 Chronic kidney disease, stage 3 unspecified: Secondary | ICD-10-CM | POA: Diagnosis not present

## 2021-03-10 DIAGNOSIS — R296 Repeated falls: Secondary | ICD-10-CM | POA: Diagnosis not present

## 2021-03-11 DIAGNOSIS — F0280 Dementia in other diseases classified elsewhere without behavioral disturbance: Secondary | ICD-10-CM | POA: Diagnosis not present

## 2021-03-11 DIAGNOSIS — N183 Chronic kidney disease, stage 3 unspecified: Secondary | ICD-10-CM | POA: Diagnosis not present

## 2021-03-11 DIAGNOSIS — R296 Repeated falls: Secondary | ICD-10-CM | POA: Diagnosis not present

## 2021-03-11 DIAGNOSIS — E119 Type 2 diabetes mellitus without complications: Secondary | ICD-10-CM | POA: Diagnosis not present

## 2021-03-11 DIAGNOSIS — G311 Senile degeneration of brain, not elsewhere classified: Secondary | ICD-10-CM | POA: Diagnosis not present

## 2021-03-11 DIAGNOSIS — H918X3 Other specified hearing loss, bilateral: Secondary | ICD-10-CM | POA: Diagnosis not present

## 2021-03-16 DIAGNOSIS — N183 Chronic kidney disease, stage 3 unspecified: Secondary | ICD-10-CM | POA: Diagnosis not present

## 2021-03-16 DIAGNOSIS — E119 Type 2 diabetes mellitus without complications: Secondary | ICD-10-CM | POA: Diagnosis not present

## 2021-03-16 DIAGNOSIS — G311 Senile degeneration of brain, not elsewhere classified: Secondary | ICD-10-CM | POA: Diagnosis not present

## 2021-03-16 DIAGNOSIS — H918X3 Other specified hearing loss, bilateral: Secondary | ICD-10-CM | POA: Diagnosis not present

## 2021-03-16 DIAGNOSIS — F0280 Dementia in other diseases classified elsewhere without behavioral disturbance: Secondary | ICD-10-CM | POA: Diagnosis not present

## 2021-03-16 DIAGNOSIS — R296 Repeated falls: Secondary | ICD-10-CM | POA: Diagnosis not present

## 2021-03-19 DIAGNOSIS — N183 Chronic kidney disease, stage 3 unspecified: Secondary | ICD-10-CM | POA: Diagnosis not present

## 2021-03-19 DIAGNOSIS — G311 Senile degeneration of brain, not elsewhere classified: Secondary | ICD-10-CM | POA: Diagnosis not present

## 2021-03-19 DIAGNOSIS — R296 Repeated falls: Secondary | ICD-10-CM | POA: Diagnosis not present

## 2021-03-19 DIAGNOSIS — H918X3 Other specified hearing loss, bilateral: Secondary | ICD-10-CM | POA: Diagnosis not present

## 2021-03-19 DIAGNOSIS — E119 Type 2 diabetes mellitus without complications: Secondary | ICD-10-CM | POA: Diagnosis not present

## 2021-03-19 DIAGNOSIS — F0280 Dementia in other diseases classified elsewhere without behavioral disturbance: Secondary | ICD-10-CM | POA: Diagnosis not present

## 2021-03-23 DIAGNOSIS — N183 Chronic kidney disease, stage 3 unspecified: Secondary | ICD-10-CM | POA: Diagnosis not present

## 2021-03-23 DIAGNOSIS — H918X3 Other specified hearing loss, bilateral: Secondary | ICD-10-CM | POA: Diagnosis not present

## 2021-03-23 DIAGNOSIS — F0280 Dementia in other diseases classified elsewhere without behavioral disturbance: Secondary | ICD-10-CM | POA: Diagnosis not present

## 2021-03-23 DIAGNOSIS — R296 Repeated falls: Secondary | ICD-10-CM | POA: Diagnosis not present

## 2021-03-23 DIAGNOSIS — G311 Senile degeneration of brain, not elsewhere classified: Secondary | ICD-10-CM | POA: Diagnosis not present

## 2021-03-23 DIAGNOSIS — E119 Type 2 diabetes mellitus without complications: Secondary | ICD-10-CM | POA: Diagnosis not present

## 2021-03-24 DIAGNOSIS — N183 Chronic kidney disease, stage 3 unspecified: Secondary | ICD-10-CM | POA: Diagnosis not present

## 2021-03-24 DIAGNOSIS — Z23 Encounter for immunization: Secondary | ICD-10-CM | POA: Diagnosis not present

## 2021-03-24 DIAGNOSIS — G311 Senile degeneration of brain, not elsewhere classified: Secondary | ICD-10-CM | POA: Diagnosis not present

## 2021-03-24 DIAGNOSIS — R296 Repeated falls: Secondary | ICD-10-CM | POA: Diagnosis not present

## 2021-03-24 DIAGNOSIS — H918X3 Other specified hearing loss, bilateral: Secondary | ICD-10-CM | POA: Diagnosis not present

## 2021-03-24 DIAGNOSIS — E119 Type 2 diabetes mellitus without complications: Secondary | ICD-10-CM | POA: Diagnosis not present

## 2021-03-24 DIAGNOSIS — F0280 Dementia in other diseases classified elsewhere without behavioral disturbance: Secondary | ICD-10-CM | POA: Diagnosis not present

## 2021-03-25 DIAGNOSIS — R296 Repeated falls: Secondary | ICD-10-CM | POA: Diagnosis not present

## 2021-03-25 DIAGNOSIS — G311 Senile degeneration of brain, not elsewhere classified: Secondary | ICD-10-CM | POA: Diagnosis not present

## 2021-03-25 DIAGNOSIS — H918X3 Other specified hearing loss, bilateral: Secondary | ICD-10-CM | POA: Diagnosis not present

## 2021-03-25 DIAGNOSIS — R54 Age-related physical debility: Secondary | ICD-10-CM | POA: Diagnosis not present

## 2021-03-25 DIAGNOSIS — Z515 Encounter for palliative care: Secondary | ICD-10-CM | POA: Diagnosis not present

## 2021-03-25 DIAGNOSIS — E119 Type 2 diabetes mellitus without complications: Secondary | ICD-10-CM | POA: Diagnosis not present

## 2021-03-25 DIAGNOSIS — F0280 Dementia in other diseases classified elsewhere without behavioral disturbance: Secondary | ICD-10-CM | POA: Diagnosis not present

## 2021-03-25 DIAGNOSIS — N183 Chronic kidney disease, stage 3 unspecified: Secondary | ICD-10-CM | POA: Diagnosis not present

## 2021-03-30 DIAGNOSIS — N183 Chronic kidney disease, stage 3 unspecified: Secondary | ICD-10-CM | POA: Diagnosis not present

## 2021-03-30 DIAGNOSIS — G311 Senile degeneration of brain, not elsewhere classified: Secondary | ICD-10-CM | POA: Diagnosis not present

## 2021-03-30 DIAGNOSIS — E119 Type 2 diabetes mellitus without complications: Secondary | ICD-10-CM | POA: Diagnosis not present

## 2021-03-30 DIAGNOSIS — R296 Repeated falls: Secondary | ICD-10-CM | POA: Diagnosis not present

## 2021-03-30 DIAGNOSIS — F0280 Dementia in other diseases classified elsewhere without behavioral disturbance: Secondary | ICD-10-CM | POA: Diagnosis not present

## 2021-03-30 DIAGNOSIS — H918X3 Other specified hearing loss, bilateral: Secondary | ICD-10-CM | POA: Diagnosis not present

## 2021-03-31 DIAGNOSIS — H918X3 Other specified hearing loss, bilateral: Secondary | ICD-10-CM | POA: Diagnosis not present

## 2021-03-31 DIAGNOSIS — E119 Type 2 diabetes mellitus without complications: Secondary | ICD-10-CM | POA: Diagnosis not present

## 2021-03-31 DIAGNOSIS — R296 Repeated falls: Secondary | ICD-10-CM | POA: Diagnosis not present

## 2021-03-31 DIAGNOSIS — F0280 Dementia in other diseases classified elsewhere without behavioral disturbance: Secondary | ICD-10-CM | POA: Diagnosis not present

## 2021-03-31 DIAGNOSIS — N183 Chronic kidney disease, stage 3 unspecified: Secondary | ICD-10-CM | POA: Diagnosis not present

## 2021-03-31 DIAGNOSIS — G311 Senile degeneration of brain, not elsewhere classified: Secondary | ICD-10-CM | POA: Diagnosis not present

## 2021-04-01 DIAGNOSIS — F0280 Dementia in other diseases classified elsewhere without behavioral disturbance: Secondary | ICD-10-CM | POA: Diagnosis not present

## 2021-04-01 DIAGNOSIS — H918X3 Other specified hearing loss, bilateral: Secondary | ICD-10-CM | POA: Diagnosis not present

## 2021-04-01 DIAGNOSIS — G311 Senile degeneration of brain, not elsewhere classified: Secondary | ICD-10-CM | POA: Diagnosis not present

## 2021-04-01 DIAGNOSIS — E119 Type 2 diabetes mellitus without complications: Secondary | ICD-10-CM | POA: Diagnosis not present

## 2021-04-01 DIAGNOSIS — R296 Repeated falls: Secondary | ICD-10-CM | POA: Diagnosis not present

## 2021-04-01 DIAGNOSIS — N183 Chronic kidney disease, stage 3 unspecified: Secondary | ICD-10-CM | POA: Diagnosis not present

## 2021-04-02 DIAGNOSIS — H918X3 Other specified hearing loss, bilateral: Secondary | ICD-10-CM | POA: Diagnosis not present

## 2021-04-02 DIAGNOSIS — G311 Senile degeneration of brain, not elsewhere classified: Secondary | ICD-10-CM | POA: Diagnosis not present

## 2021-04-02 DIAGNOSIS — R296 Repeated falls: Secondary | ICD-10-CM | POA: Diagnosis not present

## 2021-04-02 DIAGNOSIS — E119 Type 2 diabetes mellitus without complications: Secondary | ICD-10-CM | POA: Diagnosis not present

## 2021-04-02 DIAGNOSIS — N183 Chronic kidney disease, stage 3 unspecified: Secondary | ICD-10-CM | POA: Diagnosis not present

## 2021-04-02 DIAGNOSIS — F0280 Dementia in other diseases classified elsewhere without behavioral disturbance: Secondary | ICD-10-CM | POA: Diagnosis not present

## 2021-04-05 DIAGNOSIS — F0390 Unspecified dementia without behavioral disturbance: Secondary | ICD-10-CM | POA: Diagnosis not present

## 2021-04-05 DIAGNOSIS — R278 Other lack of coordination: Secondary | ICD-10-CM | POA: Diagnosis not present

## 2021-04-05 DIAGNOSIS — Z8616 Personal history of COVID-19: Secondary | ICD-10-CM | POA: Diagnosis not present

## 2021-04-05 DIAGNOSIS — R296 Repeated falls: Secondary | ICD-10-CM | POA: Diagnosis not present

## 2021-04-05 DIAGNOSIS — R293 Abnormal posture: Secondary | ICD-10-CM | POA: Diagnosis not present

## 2021-04-06 DIAGNOSIS — E119 Type 2 diabetes mellitus without complications: Secondary | ICD-10-CM | POA: Diagnosis not present

## 2021-04-06 DIAGNOSIS — G311 Senile degeneration of brain, not elsewhere classified: Secondary | ICD-10-CM | POA: Diagnosis not present

## 2021-04-06 DIAGNOSIS — N183 Chronic kidney disease, stage 3 unspecified: Secondary | ICD-10-CM | POA: Diagnosis not present

## 2021-04-06 DIAGNOSIS — R296 Repeated falls: Secondary | ICD-10-CM | POA: Diagnosis not present

## 2021-04-06 DIAGNOSIS — H918X3 Other specified hearing loss, bilateral: Secondary | ICD-10-CM | POA: Diagnosis not present

## 2021-04-06 DIAGNOSIS — F0280 Dementia in other diseases classified elsewhere without behavioral disturbance: Secondary | ICD-10-CM | POA: Diagnosis not present

## 2021-04-07 DIAGNOSIS — M1990 Unspecified osteoarthritis, unspecified site: Secondary | ICD-10-CM | POA: Diagnosis not present

## 2021-04-07 DIAGNOSIS — F809 Developmental disorder of speech and language, unspecified: Secondary | ICD-10-CM | POA: Diagnosis not present

## 2021-04-07 DIAGNOSIS — Z9189 Other specified personal risk factors, not elsewhere classified: Secondary | ICD-10-CM | POA: Diagnosis not present

## 2021-04-07 DIAGNOSIS — K5901 Slow transit constipation: Secondary | ICD-10-CM | POA: Diagnosis not present

## 2021-04-07 DIAGNOSIS — I679 Cerebrovascular disease, unspecified: Secondary | ICD-10-CM | POA: Diagnosis not present

## 2021-04-07 DIAGNOSIS — W19XXXA Unspecified fall, initial encounter: Secondary | ICD-10-CM | POA: Diagnosis not present

## 2021-04-07 DIAGNOSIS — I150 Renovascular hypertension: Secondary | ICD-10-CM | POA: Diagnosis not present

## 2021-04-07 DIAGNOSIS — D638 Anemia in other chronic diseases classified elsewhere: Secondary | ICD-10-CM | POA: Diagnosis not present

## 2021-04-07 DIAGNOSIS — F0390 Unspecified dementia without behavioral disturbance: Secondary | ICD-10-CM | POA: Diagnosis not present

## 2021-04-07 DIAGNOSIS — F39 Unspecified mood [affective] disorder: Secondary | ICD-10-CM | POA: Diagnosis not present

## 2021-04-08 DIAGNOSIS — H918X3 Other specified hearing loss, bilateral: Secondary | ICD-10-CM | POA: Diagnosis not present

## 2021-04-08 DIAGNOSIS — F0280 Dementia in other diseases classified elsewhere without behavioral disturbance: Secondary | ICD-10-CM | POA: Diagnosis not present

## 2021-04-08 DIAGNOSIS — N183 Chronic kidney disease, stage 3 unspecified: Secondary | ICD-10-CM | POA: Diagnosis not present

## 2021-04-08 DIAGNOSIS — R296 Repeated falls: Secondary | ICD-10-CM | POA: Diagnosis not present

## 2021-04-08 DIAGNOSIS — E119 Type 2 diabetes mellitus without complications: Secondary | ICD-10-CM | POA: Diagnosis not present

## 2021-04-08 DIAGNOSIS — G311 Senile degeneration of brain, not elsewhere classified: Secondary | ICD-10-CM | POA: Diagnosis not present

## 2021-04-09 DIAGNOSIS — N183 Chronic kidney disease, stage 3 unspecified: Secondary | ICD-10-CM | POA: Diagnosis not present

## 2021-04-09 DIAGNOSIS — R296 Repeated falls: Secondary | ICD-10-CM | POA: Diagnosis not present

## 2021-04-09 DIAGNOSIS — G311 Senile degeneration of brain, not elsewhere classified: Secondary | ICD-10-CM | POA: Diagnosis not present

## 2021-04-09 DIAGNOSIS — E119 Type 2 diabetes mellitus without complications: Secondary | ICD-10-CM | POA: Diagnosis not present

## 2021-04-09 DIAGNOSIS — F0280 Dementia in other diseases classified elsewhere without behavioral disturbance: Secondary | ICD-10-CM | POA: Diagnosis not present

## 2021-04-09 DIAGNOSIS — H918X3 Other specified hearing loss, bilateral: Secondary | ICD-10-CM | POA: Diagnosis not present

## 2021-04-10 DIAGNOSIS — R296 Repeated falls: Secondary | ICD-10-CM | POA: Diagnosis not present

## 2021-04-10 DIAGNOSIS — N183 Chronic kidney disease, stage 3 unspecified: Secondary | ICD-10-CM | POA: Diagnosis not present

## 2021-04-10 DIAGNOSIS — F0280 Dementia in other diseases classified elsewhere without behavioral disturbance: Secondary | ICD-10-CM | POA: Diagnosis not present

## 2021-04-10 DIAGNOSIS — G311 Senile degeneration of brain, not elsewhere classified: Secondary | ICD-10-CM | POA: Diagnosis not present

## 2021-04-10 DIAGNOSIS — H918X3 Other specified hearing loss, bilateral: Secondary | ICD-10-CM | POA: Diagnosis not present

## 2021-04-10 DIAGNOSIS — E119 Type 2 diabetes mellitus without complications: Secondary | ICD-10-CM | POA: Diagnosis not present

## 2021-04-12 DIAGNOSIS — F419 Anxiety disorder, unspecified: Secondary | ICD-10-CM | POA: Diagnosis not present

## 2021-04-12 DIAGNOSIS — S0012XA Contusion of left eyelid and periocular area, initial encounter: Secondary | ICD-10-CM | POA: Diagnosis not present

## 2021-04-12 DIAGNOSIS — R296 Repeated falls: Secondary | ICD-10-CM | POA: Diagnosis not present

## 2021-04-12 DIAGNOSIS — R269 Unspecified abnormalities of gait and mobility: Secondary | ICD-10-CM | POA: Diagnosis not present

## 2021-04-13 DIAGNOSIS — G311 Senile degeneration of brain, not elsewhere classified: Secondary | ICD-10-CM | POA: Diagnosis not present

## 2021-04-13 DIAGNOSIS — H918X3 Other specified hearing loss, bilateral: Secondary | ICD-10-CM | POA: Diagnosis not present

## 2021-04-13 DIAGNOSIS — R296 Repeated falls: Secondary | ICD-10-CM | POA: Diagnosis not present

## 2021-04-13 DIAGNOSIS — E119 Type 2 diabetes mellitus without complications: Secondary | ICD-10-CM | POA: Diagnosis not present

## 2021-04-13 DIAGNOSIS — F0280 Dementia in other diseases classified elsewhere without behavioral disturbance: Secondary | ICD-10-CM | POA: Diagnosis not present

## 2021-04-13 DIAGNOSIS — N183 Chronic kidney disease, stage 3 unspecified: Secondary | ICD-10-CM | POA: Diagnosis not present

## 2021-04-15 DIAGNOSIS — N183 Chronic kidney disease, stage 3 unspecified: Secondary | ICD-10-CM | POA: Diagnosis not present

## 2021-04-15 DIAGNOSIS — F3342 Major depressive disorder, recurrent, in full remission: Secondary | ICD-10-CM | POA: Diagnosis not present

## 2021-04-15 DIAGNOSIS — H918X3 Other specified hearing loss, bilateral: Secondary | ICD-10-CM | POA: Diagnosis not present

## 2021-04-15 DIAGNOSIS — G311 Senile degeneration of brain, not elsewhere classified: Secondary | ICD-10-CM | POA: Diagnosis not present

## 2021-04-15 DIAGNOSIS — E119 Type 2 diabetes mellitus without complications: Secondary | ICD-10-CM | POA: Diagnosis not present

## 2021-04-15 DIAGNOSIS — F0280 Dementia in other diseases classified elsewhere without behavioral disturbance: Secondary | ICD-10-CM | POA: Diagnosis not present

## 2021-04-15 DIAGNOSIS — R296 Repeated falls: Secondary | ICD-10-CM | POA: Diagnosis not present

## 2021-04-16 DIAGNOSIS — E119 Type 2 diabetes mellitus without complications: Secondary | ICD-10-CM | POA: Diagnosis not present

## 2021-04-16 DIAGNOSIS — G311 Senile degeneration of brain, not elsewhere classified: Secondary | ICD-10-CM | POA: Diagnosis not present

## 2021-04-16 DIAGNOSIS — F0280 Dementia in other diseases classified elsewhere without behavioral disturbance: Secondary | ICD-10-CM | POA: Diagnosis not present

## 2021-04-16 DIAGNOSIS — H918X3 Other specified hearing loss, bilateral: Secondary | ICD-10-CM | POA: Diagnosis not present

## 2021-04-16 DIAGNOSIS — N183 Chronic kidney disease, stage 3 unspecified: Secondary | ICD-10-CM | POA: Diagnosis not present

## 2021-04-16 DIAGNOSIS — R296 Repeated falls: Secondary | ICD-10-CM | POA: Diagnosis not present

## 2021-04-20 DIAGNOSIS — E119 Type 2 diabetes mellitus without complications: Secondary | ICD-10-CM | POA: Diagnosis not present

## 2021-04-20 DIAGNOSIS — R296 Repeated falls: Secondary | ICD-10-CM | POA: Diagnosis not present

## 2021-04-20 DIAGNOSIS — H918X3 Other specified hearing loss, bilateral: Secondary | ICD-10-CM | POA: Diagnosis not present

## 2021-04-20 DIAGNOSIS — F0280 Dementia in other diseases classified elsewhere without behavioral disturbance: Secondary | ICD-10-CM | POA: Diagnosis not present

## 2021-04-20 DIAGNOSIS — N183 Chronic kidney disease, stage 3 unspecified: Secondary | ICD-10-CM | POA: Diagnosis not present

## 2021-04-20 DIAGNOSIS — G311 Senile degeneration of brain, not elsewhere classified: Secondary | ICD-10-CM | POA: Diagnosis not present

## 2021-04-22 DIAGNOSIS — E119 Type 2 diabetes mellitus without complications: Secondary | ICD-10-CM | POA: Diagnosis not present

## 2021-04-22 DIAGNOSIS — H918X3 Other specified hearing loss, bilateral: Secondary | ICD-10-CM | POA: Diagnosis not present

## 2021-04-22 DIAGNOSIS — R296 Repeated falls: Secondary | ICD-10-CM | POA: Diagnosis not present

## 2021-04-22 DIAGNOSIS — G311 Senile degeneration of brain, not elsewhere classified: Secondary | ICD-10-CM | POA: Diagnosis not present

## 2021-04-22 DIAGNOSIS — N183 Chronic kidney disease, stage 3 unspecified: Secondary | ICD-10-CM | POA: Diagnosis not present

## 2021-04-22 DIAGNOSIS — F0280 Dementia in other diseases classified elsewhere without behavioral disturbance: Secondary | ICD-10-CM | POA: Diagnosis not present

## 2021-04-23 DIAGNOSIS — R296 Repeated falls: Secondary | ICD-10-CM | POA: Diagnosis not present

## 2021-04-23 DIAGNOSIS — G311 Senile degeneration of brain, not elsewhere classified: Secondary | ICD-10-CM | POA: Diagnosis not present

## 2021-04-23 DIAGNOSIS — E119 Type 2 diabetes mellitus without complications: Secondary | ICD-10-CM | POA: Diagnosis not present

## 2021-04-23 DIAGNOSIS — H918X3 Other specified hearing loss, bilateral: Secondary | ICD-10-CM | POA: Diagnosis not present

## 2021-04-23 DIAGNOSIS — N183 Chronic kidney disease, stage 3 unspecified: Secondary | ICD-10-CM | POA: Diagnosis not present

## 2021-04-23 DIAGNOSIS — F0280 Dementia in other diseases classified elsewhere without behavioral disturbance: Secondary | ICD-10-CM | POA: Diagnosis not present

## 2021-04-25 DIAGNOSIS — R296 Repeated falls: Secondary | ICD-10-CM | POA: Diagnosis not present

## 2021-04-25 DIAGNOSIS — E119 Type 2 diabetes mellitus without complications: Secondary | ICD-10-CM | POA: Diagnosis not present

## 2021-04-25 DIAGNOSIS — Z515 Encounter for palliative care: Secondary | ICD-10-CM | POA: Diagnosis not present

## 2021-04-25 DIAGNOSIS — H918X3 Other specified hearing loss, bilateral: Secondary | ICD-10-CM | POA: Diagnosis not present

## 2021-04-25 DIAGNOSIS — G311 Senile degeneration of brain, not elsewhere classified: Secondary | ICD-10-CM | POA: Diagnosis not present

## 2021-04-25 DIAGNOSIS — F0280 Dementia in other diseases classified elsewhere without behavioral disturbance: Secondary | ICD-10-CM | POA: Diagnosis not present

## 2021-04-25 DIAGNOSIS — N183 Chronic kidney disease, stage 3 unspecified: Secondary | ICD-10-CM | POA: Diagnosis not present

## 2021-04-25 DIAGNOSIS — R54 Age-related physical debility: Secondary | ICD-10-CM | POA: Diagnosis not present

## 2021-04-27 DIAGNOSIS — G311 Senile degeneration of brain, not elsewhere classified: Secondary | ICD-10-CM | POA: Diagnosis not present

## 2021-04-27 DIAGNOSIS — N183 Chronic kidney disease, stage 3 unspecified: Secondary | ICD-10-CM | POA: Diagnosis not present

## 2021-04-27 DIAGNOSIS — F0280 Dementia in other diseases classified elsewhere without behavioral disturbance: Secondary | ICD-10-CM | POA: Diagnosis not present

## 2021-04-27 DIAGNOSIS — H918X3 Other specified hearing loss, bilateral: Secondary | ICD-10-CM | POA: Diagnosis not present

## 2021-04-27 DIAGNOSIS — R296 Repeated falls: Secondary | ICD-10-CM | POA: Diagnosis not present

## 2021-04-27 DIAGNOSIS — E119 Type 2 diabetes mellitus without complications: Secondary | ICD-10-CM | POA: Diagnosis not present

## 2021-04-29 DIAGNOSIS — G311 Senile degeneration of brain, not elsewhere classified: Secondary | ICD-10-CM | POA: Diagnosis not present

## 2021-04-29 DIAGNOSIS — F0280 Dementia in other diseases classified elsewhere without behavioral disturbance: Secondary | ICD-10-CM | POA: Diagnosis not present

## 2021-04-29 DIAGNOSIS — R296 Repeated falls: Secondary | ICD-10-CM | POA: Diagnosis not present

## 2021-04-29 DIAGNOSIS — E119 Type 2 diabetes mellitus without complications: Secondary | ICD-10-CM | POA: Diagnosis not present

## 2021-04-29 DIAGNOSIS — H918X3 Other specified hearing loss, bilateral: Secondary | ICD-10-CM | POA: Diagnosis not present

## 2021-04-29 DIAGNOSIS — N183 Chronic kidney disease, stage 3 unspecified: Secondary | ICD-10-CM | POA: Diagnosis not present

## 2021-05-04 DIAGNOSIS — H918X3 Other specified hearing loss, bilateral: Secondary | ICD-10-CM | POA: Diagnosis not present

## 2021-05-04 DIAGNOSIS — F0280 Dementia in other diseases classified elsewhere without behavioral disturbance: Secondary | ICD-10-CM | POA: Diagnosis not present

## 2021-05-04 DIAGNOSIS — G311 Senile degeneration of brain, not elsewhere classified: Secondary | ICD-10-CM | POA: Diagnosis not present

## 2021-05-04 DIAGNOSIS — R296 Repeated falls: Secondary | ICD-10-CM | POA: Diagnosis not present

## 2021-05-04 DIAGNOSIS — N183 Chronic kidney disease, stage 3 unspecified: Secondary | ICD-10-CM | POA: Diagnosis not present

## 2021-05-04 DIAGNOSIS — E119 Type 2 diabetes mellitus without complications: Secondary | ICD-10-CM | POA: Diagnosis not present

## 2021-05-06 DIAGNOSIS — E119 Type 2 diabetes mellitus without complications: Secondary | ICD-10-CM | POA: Diagnosis not present

## 2021-05-06 DIAGNOSIS — R296 Repeated falls: Secondary | ICD-10-CM | POA: Diagnosis not present

## 2021-05-06 DIAGNOSIS — G311 Senile degeneration of brain, not elsewhere classified: Secondary | ICD-10-CM | POA: Diagnosis not present

## 2021-05-06 DIAGNOSIS — H918X3 Other specified hearing loss, bilateral: Secondary | ICD-10-CM | POA: Diagnosis not present

## 2021-05-06 DIAGNOSIS — F0280 Dementia in other diseases classified elsewhere without behavioral disturbance: Secondary | ICD-10-CM | POA: Diagnosis not present

## 2021-05-06 DIAGNOSIS — N183 Chronic kidney disease, stage 3 unspecified: Secondary | ICD-10-CM | POA: Diagnosis not present

## 2021-05-11 DIAGNOSIS — E119 Type 2 diabetes mellitus without complications: Secondary | ICD-10-CM | POA: Diagnosis not present

## 2021-05-11 DIAGNOSIS — G311 Senile degeneration of brain, not elsewhere classified: Secondary | ICD-10-CM | POA: Diagnosis not present

## 2021-05-11 DIAGNOSIS — R296 Repeated falls: Secondary | ICD-10-CM | POA: Diagnosis not present

## 2021-05-11 DIAGNOSIS — F0280 Dementia in other diseases classified elsewhere without behavioral disturbance: Secondary | ICD-10-CM | POA: Diagnosis not present

## 2021-05-11 DIAGNOSIS — H918X3 Other specified hearing loss, bilateral: Secondary | ICD-10-CM | POA: Diagnosis not present

## 2021-05-11 DIAGNOSIS — N183 Chronic kidney disease, stage 3 unspecified: Secondary | ICD-10-CM | POA: Diagnosis not present

## 2021-05-13 DIAGNOSIS — G311 Senile degeneration of brain, not elsewhere classified: Secondary | ICD-10-CM | POA: Diagnosis not present

## 2021-05-13 DIAGNOSIS — N183 Chronic kidney disease, stage 3 unspecified: Secondary | ICD-10-CM | POA: Diagnosis not present

## 2021-05-13 DIAGNOSIS — F0280 Dementia in other diseases classified elsewhere without behavioral disturbance: Secondary | ICD-10-CM | POA: Diagnosis not present

## 2021-05-13 DIAGNOSIS — E119 Type 2 diabetes mellitus without complications: Secondary | ICD-10-CM | POA: Diagnosis not present

## 2021-05-13 DIAGNOSIS — R296 Repeated falls: Secondary | ICD-10-CM | POA: Diagnosis not present

## 2021-05-13 DIAGNOSIS — H918X3 Other specified hearing loss, bilateral: Secondary | ICD-10-CM | POA: Diagnosis not present

## 2021-05-14 DIAGNOSIS — F0280 Dementia in other diseases classified elsewhere without behavioral disturbance: Secondary | ICD-10-CM | POA: Diagnosis not present

## 2021-05-14 DIAGNOSIS — R296 Repeated falls: Secondary | ICD-10-CM | POA: Diagnosis not present

## 2021-05-14 DIAGNOSIS — N183 Chronic kidney disease, stage 3 unspecified: Secondary | ICD-10-CM | POA: Diagnosis not present

## 2021-05-14 DIAGNOSIS — E119 Type 2 diabetes mellitus without complications: Secondary | ICD-10-CM | POA: Diagnosis not present

## 2021-05-14 DIAGNOSIS — G311 Senile degeneration of brain, not elsewhere classified: Secondary | ICD-10-CM | POA: Diagnosis not present

## 2021-05-14 DIAGNOSIS — H918X3 Other specified hearing loss, bilateral: Secondary | ICD-10-CM | POA: Diagnosis not present

## 2021-05-18 DIAGNOSIS — N183 Chronic kidney disease, stage 3 unspecified: Secondary | ICD-10-CM | POA: Diagnosis not present

## 2021-05-18 DIAGNOSIS — H918X3 Other specified hearing loss, bilateral: Secondary | ICD-10-CM | POA: Diagnosis not present

## 2021-05-18 DIAGNOSIS — F0280 Dementia in other diseases classified elsewhere without behavioral disturbance: Secondary | ICD-10-CM | POA: Diagnosis not present

## 2021-05-18 DIAGNOSIS — G311 Senile degeneration of brain, not elsewhere classified: Secondary | ICD-10-CM | POA: Diagnosis not present

## 2021-05-18 DIAGNOSIS — E119 Type 2 diabetes mellitus without complications: Secondary | ICD-10-CM | POA: Diagnosis not present

## 2021-05-18 DIAGNOSIS — R296 Repeated falls: Secondary | ICD-10-CM | POA: Diagnosis not present

## 2021-05-19 DIAGNOSIS — H918X3 Other specified hearing loss, bilateral: Secondary | ICD-10-CM | POA: Diagnosis not present

## 2021-05-19 DIAGNOSIS — F0280 Dementia in other diseases classified elsewhere without behavioral disturbance: Secondary | ICD-10-CM | POA: Diagnosis not present

## 2021-05-19 DIAGNOSIS — N183 Chronic kidney disease, stage 3 unspecified: Secondary | ICD-10-CM | POA: Diagnosis not present

## 2021-05-19 DIAGNOSIS — R296 Repeated falls: Secondary | ICD-10-CM | POA: Diagnosis not present

## 2021-05-19 DIAGNOSIS — E119 Type 2 diabetes mellitus without complications: Secondary | ICD-10-CM | POA: Diagnosis not present

## 2021-05-19 DIAGNOSIS — G311 Senile degeneration of brain, not elsewhere classified: Secondary | ICD-10-CM | POA: Diagnosis not present

## 2021-05-20 DIAGNOSIS — N183 Chronic kidney disease, stage 3 unspecified: Secondary | ICD-10-CM | POA: Diagnosis not present

## 2021-05-20 DIAGNOSIS — H918X3 Other specified hearing loss, bilateral: Secondary | ICD-10-CM | POA: Diagnosis not present

## 2021-05-20 DIAGNOSIS — E119 Type 2 diabetes mellitus without complications: Secondary | ICD-10-CM | POA: Diagnosis not present

## 2021-05-20 DIAGNOSIS — R296 Repeated falls: Secondary | ICD-10-CM | POA: Diagnosis not present

## 2021-05-20 DIAGNOSIS — F0280 Dementia in other diseases classified elsewhere without behavioral disturbance: Secondary | ICD-10-CM | POA: Diagnosis not present

## 2021-05-20 DIAGNOSIS — G311 Senile degeneration of brain, not elsewhere classified: Secondary | ICD-10-CM | POA: Diagnosis not present

## 2021-05-22 DIAGNOSIS — E119 Type 2 diabetes mellitus without complications: Secondary | ICD-10-CM | POA: Diagnosis not present

## 2021-05-22 DIAGNOSIS — F0280 Dementia in other diseases classified elsewhere without behavioral disturbance: Secondary | ICD-10-CM | POA: Diagnosis not present

## 2021-05-22 DIAGNOSIS — H918X3 Other specified hearing loss, bilateral: Secondary | ICD-10-CM | POA: Diagnosis not present

## 2021-05-22 DIAGNOSIS — G311 Senile degeneration of brain, not elsewhere classified: Secondary | ICD-10-CM | POA: Diagnosis not present

## 2021-05-22 DIAGNOSIS — N183 Chronic kidney disease, stage 3 unspecified: Secondary | ICD-10-CM | POA: Diagnosis not present

## 2021-05-22 DIAGNOSIS — R296 Repeated falls: Secondary | ICD-10-CM | POA: Diagnosis not present

## 2021-05-25 DIAGNOSIS — H918X3 Other specified hearing loss, bilateral: Secondary | ICD-10-CM | POA: Diagnosis not present

## 2021-05-25 DIAGNOSIS — N183 Chronic kidney disease, stage 3 unspecified: Secondary | ICD-10-CM | POA: Diagnosis not present

## 2021-05-25 DIAGNOSIS — F0280 Dementia in other diseases classified elsewhere without behavioral disturbance: Secondary | ICD-10-CM | POA: Diagnosis not present

## 2021-05-25 DIAGNOSIS — R296 Repeated falls: Secondary | ICD-10-CM | POA: Diagnosis not present

## 2021-05-25 DIAGNOSIS — E119 Type 2 diabetes mellitus without complications: Secondary | ICD-10-CM | POA: Diagnosis not present

## 2021-05-25 DIAGNOSIS — G311 Senile degeneration of brain, not elsewhere classified: Secondary | ICD-10-CM | POA: Diagnosis not present

## 2021-05-26 DIAGNOSIS — F0280 Dementia in other diseases classified elsewhere without behavioral disturbance: Secondary | ICD-10-CM | POA: Diagnosis not present

## 2021-05-26 DIAGNOSIS — H579 Unspecified disorder of eye and adnexa: Secondary | ICD-10-CM | POA: Diagnosis not present

## 2021-05-26 DIAGNOSIS — R54 Age-related physical debility: Secondary | ICD-10-CM | POA: Diagnosis not present

## 2021-05-26 DIAGNOSIS — I150 Renovascular hypertension: Secondary | ICD-10-CM | POA: Diagnosis not present

## 2021-05-26 DIAGNOSIS — D649 Anemia, unspecified: Secondary | ICD-10-CM | POA: Diagnosis not present

## 2021-05-26 DIAGNOSIS — G311 Senile degeneration of brain, not elsewhere classified: Secondary | ICD-10-CM | POA: Diagnosis not present

## 2021-05-26 DIAGNOSIS — E119 Type 2 diabetes mellitus without complications: Secondary | ICD-10-CM | POA: Diagnosis not present

## 2021-05-26 DIAGNOSIS — M1990 Unspecified osteoarthritis, unspecified site: Secondary | ICD-10-CM | POA: Diagnosis not present

## 2021-05-26 DIAGNOSIS — N184 Chronic kidney disease, stage 4 (severe): Secondary | ICD-10-CM | POA: Diagnosis not present

## 2021-05-26 DIAGNOSIS — H409 Unspecified glaucoma: Secondary | ICD-10-CM | POA: Diagnosis not present

## 2021-05-26 DIAGNOSIS — I679 Cerebrovascular disease, unspecified: Secondary | ICD-10-CM | POA: Diagnosis not present

## 2021-05-26 DIAGNOSIS — M79641 Pain in right hand: Secondary | ICD-10-CM | POA: Diagnosis not present

## 2021-05-26 DIAGNOSIS — R296 Repeated falls: Secondary | ICD-10-CM | POA: Diagnosis not present

## 2021-05-26 DIAGNOSIS — Z515 Encounter for palliative care: Secondary | ICD-10-CM | POA: Diagnosis not present

## 2021-05-26 DIAGNOSIS — F809 Developmental disorder of speech and language, unspecified: Secondary | ICD-10-CM | POA: Diagnosis not present

## 2021-05-26 DIAGNOSIS — H918X3 Other specified hearing loss, bilateral: Secondary | ICD-10-CM | POA: Diagnosis not present

## 2021-05-26 DIAGNOSIS — N183 Chronic kidney disease, stage 3 unspecified: Secondary | ICD-10-CM | POA: Diagnosis not present

## 2021-05-26 DIAGNOSIS — F39 Unspecified mood [affective] disorder: Secondary | ICD-10-CM | POA: Diagnosis not present

## 2021-05-27 DIAGNOSIS — R296 Repeated falls: Secondary | ICD-10-CM | POA: Diagnosis not present

## 2021-05-27 DIAGNOSIS — H918X3 Other specified hearing loss, bilateral: Secondary | ICD-10-CM | POA: Diagnosis not present

## 2021-05-27 DIAGNOSIS — E119 Type 2 diabetes mellitus without complications: Secondary | ICD-10-CM | POA: Diagnosis not present

## 2021-05-27 DIAGNOSIS — G311 Senile degeneration of brain, not elsewhere classified: Secondary | ICD-10-CM | POA: Diagnosis not present

## 2021-05-27 DIAGNOSIS — N183 Chronic kidney disease, stage 3 unspecified: Secondary | ICD-10-CM | POA: Diagnosis not present

## 2021-05-27 DIAGNOSIS — F0280 Dementia in other diseases classified elsewhere without behavioral disturbance: Secondary | ICD-10-CM | POA: Diagnosis not present

## 2021-05-28 DIAGNOSIS — R296 Repeated falls: Secondary | ICD-10-CM | POA: Diagnosis not present

## 2021-05-28 DIAGNOSIS — N183 Chronic kidney disease, stage 3 unspecified: Secondary | ICD-10-CM | POA: Diagnosis not present

## 2021-05-28 DIAGNOSIS — F0280 Dementia in other diseases classified elsewhere without behavioral disturbance: Secondary | ICD-10-CM | POA: Diagnosis not present

## 2021-05-28 DIAGNOSIS — G311 Senile degeneration of brain, not elsewhere classified: Secondary | ICD-10-CM | POA: Diagnosis not present

## 2021-05-28 DIAGNOSIS — E119 Type 2 diabetes mellitus without complications: Secondary | ICD-10-CM | POA: Diagnosis not present

## 2021-05-28 DIAGNOSIS — H918X3 Other specified hearing loss, bilateral: Secondary | ICD-10-CM | POA: Diagnosis not present

## 2021-06-01 DIAGNOSIS — N183 Chronic kidney disease, stage 3 unspecified: Secondary | ICD-10-CM | POA: Diagnosis not present

## 2021-06-01 DIAGNOSIS — R296 Repeated falls: Secondary | ICD-10-CM | POA: Diagnosis not present

## 2021-06-01 DIAGNOSIS — H918X3 Other specified hearing loss, bilateral: Secondary | ICD-10-CM | POA: Diagnosis not present

## 2021-06-01 DIAGNOSIS — E119 Type 2 diabetes mellitus without complications: Secondary | ICD-10-CM | POA: Diagnosis not present

## 2021-06-01 DIAGNOSIS — F0280 Dementia in other diseases classified elsewhere without behavioral disturbance: Secondary | ICD-10-CM | POA: Diagnosis not present

## 2021-06-01 DIAGNOSIS — G311 Senile degeneration of brain, not elsewhere classified: Secondary | ICD-10-CM | POA: Diagnosis not present

## 2021-06-03 DIAGNOSIS — R296 Repeated falls: Secondary | ICD-10-CM | POA: Diagnosis not present

## 2021-06-03 DIAGNOSIS — M25511 Pain in right shoulder: Secondary | ICD-10-CM | POA: Diagnosis not present

## 2021-06-03 DIAGNOSIS — N183 Chronic kidney disease, stage 3 unspecified: Secondary | ICD-10-CM | POA: Diagnosis not present

## 2021-06-03 DIAGNOSIS — G311 Senile degeneration of brain, not elsewhere classified: Secondary | ICD-10-CM | POA: Diagnosis not present

## 2021-06-03 DIAGNOSIS — E119 Type 2 diabetes mellitus without complications: Secondary | ICD-10-CM | POA: Diagnosis not present

## 2021-06-03 DIAGNOSIS — F0280 Dementia in other diseases classified elsewhere without behavioral disturbance: Secondary | ICD-10-CM | POA: Diagnosis not present

## 2021-06-03 DIAGNOSIS — M79621 Pain in right upper arm: Secondary | ICD-10-CM | POA: Diagnosis not present

## 2021-06-03 DIAGNOSIS — H918X3 Other specified hearing loss, bilateral: Secondary | ICD-10-CM | POA: Diagnosis not present

## 2021-06-03 DIAGNOSIS — R2231 Localized swelling, mass and lump, right upper limb: Secondary | ICD-10-CM | POA: Diagnosis not present

## 2021-06-04 DIAGNOSIS — F0390 Unspecified dementia without behavioral disturbance: Secondary | ICD-10-CM | POA: Diagnosis not present

## 2021-06-04 DIAGNOSIS — M25411 Effusion, right shoulder: Secondary | ICD-10-CM | POA: Diagnosis not present

## 2021-06-04 DIAGNOSIS — R296 Repeated falls: Secondary | ICD-10-CM | POA: Diagnosis not present

## 2021-06-08 DIAGNOSIS — F0280 Dementia in other diseases classified elsewhere without behavioral disturbance: Secondary | ICD-10-CM | POA: Diagnosis not present

## 2021-06-08 DIAGNOSIS — N183 Chronic kidney disease, stage 3 unspecified: Secondary | ICD-10-CM | POA: Diagnosis not present

## 2021-06-08 DIAGNOSIS — H918X3 Other specified hearing loss, bilateral: Secondary | ICD-10-CM | POA: Diagnosis not present

## 2021-06-08 DIAGNOSIS — R296 Repeated falls: Secondary | ICD-10-CM | POA: Diagnosis not present

## 2021-06-08 DIAGNOSIS — E119 Type 2 diabetes mellitus without complications: Secondary | ICD-10-CM | POA: Diagnosis not present

## 2021-06-08 DIAGNOSIS — G311 Senile degeneration of brain, not elsewhere classified: Secondary | ICD-10-CM | POA: Diagnosis not present

## 2021-06-10 DIAGNOSIS — R296 Repeated falls: Secondary | ICD-10-CM | POA: Diagnosis not present

## 2021-06-10 DIAGNOSIS — F0280 Dementia in other diseases classified elsewhere without behavioral disturbance: Secondary | ICD-10-CM | POA: Diagnosis not present

## 2021-06-10 DIAGNOSIS — N183 Chronic kidney disease, stage 3 unspecified: Secondary | ICD-10-CM | POA: Diagnosis not present

## 2021-06-10 DIAGNOSIS — G311 Senile degeneration of brain, not elsewhere classified: Secondary | ICD-10-CM | POA: Diagnosis not present

## 2021-06-10 DIAGNOSIS — E119 Type 2 diabetes mellitus without complications: Secondary | ICD-10-CM | POA: Diagnosis not present

## 2021-06-10 DIAGNOSIS — H918X3 Other specified hearing loss, bilateral: Secondary | ICD-10-CM | POA: Diagnosis not present

## 2021-06-11 DIAGNOSIS — E119 Type 2 diabetes mellitus without complications: Secondary | ICD-10-CM | POA: Diagnosis not present

## 2021-06-11 DIAGNOSIS — F01511 Vascular dementia, unspecified severity, with agitation: Secondary | ICD-10-CM | POA: Diagnosis not present

## 2021-06-11 DIAGNOSIS — E43 Unspecified severe protein-calorie malnutrition: Secondary | ICD-10-CM | POA: Diagnosis not present

## 2021-06-11 DIAGNOSIS — M1990 Unspecified osteoarthritis, unspecified site: Secondary | ICD-10-CM | POA: Diagnosis not present

## 2021-06-11 DIAGNOSIS — G2409 Other drug induced dystonia: Secondary | ICD-10-CM | POA: Diagnosis not present

## 2021-06-11 DIAGNOSIS — D500 Iron deficiency anemia secondary to blood loss (chronic): Secondary | ICD-10-CM | POA: Diagnosis not present

## 2021-06-11 DIAGNOSIS — E785 Hyperlipidemia, unspecified: Secondary | ICD-10-CM | POA: Diagnosis not present

## 2021-06-11 DIAGNOSIS — I679 Cerebrovascular disease, unspecified: Secondary | ICD-10-CM | POA: Diagnosis not present

## 2021-06-11 DIAGNOSIS — N184 Chronic kidney disease, stage 4 (severe): Secondary | ICD-10-CM | POA: Diagnosis not present

## 2021-06-11 DIAGNOSIS — Z8616 Personal history of COVID-19: Secondary | ICD-10-CM | POA: Diagnosis not present

## 2021-06-11 DIAGNOSIS — F39 Unspecified mood [affective] disorder: Secondary | ICD-10-CM | POA: Diagnosis not present

## 2021-06-13 DIAGNOSIS — D500 Iron deficiency anemia secondary to blood loss (chronic): Secondary | ICD-10-CM | POA: Diagnosis not present

## 2021-06-13 DIAGNOSIS — F39 Unspecified mood [affective] disorder: Secondary | ICD-10-CM | POA: Diagnosis not present

## 2021-06-13 DIAGNOSIS — N184 Chronic kidney disease, stage 4 (severe): Secondary | ICD-10-CM | POA: Diagnosis not present

## 2021-06-13 DIAGNOSIS — E119 Type 2 diabetes mellitus without complications: Secondary | ICD-10-CM | POA: Diagnosis not present

## 2021-06-13 DIAGNOSIS — I679 Cerebrovascular disease, unspecified: Secondary | ICD-10-CM | POA: Diagnosis not present

## 2021-06-13 DIAGNOSIS — E43 Unspecified severe protein-calorie malnutrition: Secondary | ICD-10-CM | POA: Diagnosis not present

## 2021-06-15 DIAGNOSIS — D500 Iron deficiency anemia secondary to blood loss (chronic): Secondary | ICD-10-CM | POA: Diagnosis not present

## 2021-06-15 DIAGNOSIS — E43 Unspecified severe protein-calorie malnutrition: Secondary | ICD-10-CM | POA: Diagnosis not present

## 2021-06-15 DIAGNOSIS — F39 Unspecified mood [affective] disorder: Secondary | ICD-10-CM | POA: Diagnosis not present

## 2021-06-15 DIAGNOSIS — N184 Chronic kidney disease, stage 4 (severe): Secondary | ICD-10-CM | POA: Diagnosis not present

## 2021-06-15 DIAGNOSIS — I679 Cerebrovascular disease, unspecified: Secondary | ICD-10-CM | POA: Diagnosis not present

## 2021-06-15 DIAGNOSIS — E119 Type 2 diabetes mellitus without complications: Secondary | ICD-10-CM | POA: Diagnosis not present

## 2021-06-17 DIAGNOSIS — E43 Unspecified severe protein-calorie malnutrition: Secondary | ICD-10-CM | POA: Diagnosis not present

## 2021-06-17 DIAGNOSIS — F39 Unspecified mood [affective] disorder: Secondary | ICD-10-CM | POA: Diagnosis not present

## 2021-06-17 DIAGNOSIS — D500 Iron deficiency anemia secondary to blood loss (chronic): Secondary | ICD-10-CM | POA: Diagnosis not present

## 2021-06-17 DIAGNOSIS — E119 Type 2 diabetes mellitus without complications: Secondary | ICD-10-CM | POA: Diagnosis not present

## 2021-06-17 DIAGNOSIS — N184 Chronic kidney disease, stage 4 (severe): Secondary | ICD-10-CM | POA: Diagnosis not present

## 2021-06-17 DIAGNOSIS — I679 Cerebrovascular disease, unspecified: Secondary | ICD-10-CM | POA: Diagnosis not present

## 2021-06-18 DIAGNOSIS — R296 Repeated falls: Secondary | ICD-10-CM | POA: Diagnosis not present

## 2021-06-18 DIAGNOSIS — D500 Iron deficiency anemia secondary to blood loss (chronic): Secondary | ICD-10-CM | POA: Diagnosis not present

## 2021-06-18 DIAGNOSIS — I679 Cerebrovascular disease, unspecified: Secondary | ICD-10-CM | POA: Diagnosis not present

## 2021-06-18 DIAGNOSIS — H93013 Transient ischemic deafness, bilateral: Secondary | ICD-10-CM | POA: Diagnosis not present

## 2021-06-18 DIAGNOSIS — M1990 Unspecified osteoarthritis, unspecified site: Secondary | ICD-10-CM | POA: Diagnosis not present

## 2021-06-18 DIAGNOSIS — R278 Other lack of coordination: Secondary | ICD-10-CM | POA: Diagnosis not present

## 2021-06-18 DIAGNOSIS — F0390 Unspecified dementia without behavioral disturbance: Secondary | ICD-10-CM | POA: Diagnosis not present

## 2021-06-18 DIAGNOSIS — E43 Unspecified severe protein-calorie malnutrition: Secondary | ICD-10-CM | POA: Diagnosis not present

## 2021-06-18 DIAGNOSIS — R293 Abnormal posture: Secondary | ICD-10-CM | POA: Diagnosis not present

## 2021-06-18 DIAGNOSIS — F39 Unspecified mood [affective] disorder: Secondary | ICD-10-CM | POA: Diagnosis not present

## 2021-06-18 DIAGNOSIS — N184 Chronic kidney disease, stage 4 (severe): Secondary | ICD-10-CM | POA: Diagnosis not present

## 2021-06-18 DIAGNOSIS — E119 Type 2 diabetes mellitus without complications: Secondary | ICD-10-CM | POA: Diagnosis not present

## 2021-06-21 DIAGNOSIS — D500 Iron deficiency anemia secondary to blood loss (chronic): Secondary | ICD-10-CM | POA: Diagnosis not present

## 2021-06-21 DIAGNOSIS — I679 Cerebrovascular disease, unspecified: Secondary | ICD-10-CM | POA: Diagnosis not present

## 2021-06-21 DIAGNOSIS — H93013 Transient ischemic deafness, bilateral: Secondary | ICD-10-CM | POA: Diagnosis not present

## 2021-06-21 DIAGNOSIS — N184 Chronic kidney disease, stage 4 (severe): Secondary | ICD-10-CM | POA: Diagnosis not present

## 2021-06-21 DIAGNOSIS — F0390 Unspecified dementia without behavioral disturbance: Secondary | ICD-10-CM | POA: Diagnosis not present

## 2021-06-21 DIAGNOSIS — R293 Abnormal posture: Secondary | ICD-10-CM | POA: Diagnosis not present

## 2021-06-21 DIAGNOSIS — E119 Type 2 diabetes mellitus without complications: Secondary | ICD-10-CM | POA: Diagnosis not present

## 2021-06-21 DIAGNOSIS — E43 Unspecified severe protein-calorie malnutrition: Secondary | ICD-10-CM | POA: Diagnosis not present

## 2021-06-21 DIAGNOSIS — R278 Other lack of coordination: Secondary | ICD-10-CM | POA: Diagnosis not present

## 2021-06-21 DIAGNOSIS — M1990 Unspecified osteoarthritis, unspecified site: Secondary | ICD-10-CM | POA: Diagnosis not present

## 2021-06-21 DIAGNOSIS — F39 Unspecified mood [affective] disorder: Secondary | ICD-10-CM | POA: Diagnosis not present

## 2021-06-22 DIAGNOSIS — E119 Type 2 diabetes mellitus without complications: Secondary | ICD-10-CM | POA: Diagnosis not present

## 2021-06-22 DIAGNOSIS — D500 Iron deficiency anemia secondary to blood loss (chronic): Secondary | ICD-10-CM | POA: Diagnosis not present

## 2021-06-22 DIAGNOSIS — R278 Other lack of coordination: Secondary | ICD-10-CM | POA: Diagnosis not present

## 2021-06-22 DIAGNOSIS — F0390 Unspecified dementia without behavioral disturbance: Secondary | ICD-10-CM | POA: Diagnosis not present

## 2021-06-22 DIAGNOSIS — E43 Unspecified severe protein-calorie malnutrition: Secondary | ICD-10-CM | POA: Diagnosis not present

## 2021-06-22 DIAGNOSIS — M1990 Unspecified osteoarthritis, unspecified site: Secondary | ICD-10-CM | POA: Diagnosis not present

## 2021-06-22 DIAGNOSIS — F39 Unspecified mood [affective] disorder: Secondary | ICD-10-CM | POA: Diagnosis not present

## 2021-06-22 DIAGNOSIS — I679 Cerebrovascular disease, unspecified: Secondary | ICD-10-CM | POA: Diagnosis not present

## 2021-06-22 DIAGNOSIS — N184 Chronic kidney disease, stage 4 (severe): Secondary | ICD-10-CM | POA: Diagnosis not present

## 2021-06-22 DIAGNOSIS — H93013 Transient ischemic deafness, bilateral: Secondary | ICD-10-CM | POA: Diagnosis not present

## 2021-06-22 DIAGNOSIS — R293 Abnormal posture: Secondary | ICD-10-CM | POA: Diagnosis not present

## 2021-06-23 DIAGNOSIS — Z8616 Personal history of COVID-19: Secondary | ICD-10-CM | POA: Diagnosis not present

## 2021-06-23 DIAGNOSIS — R278 Other lack of coordination: Secondary | ICD-10-CM | POA: Diagnosis not present

## 2021-06-23 DIAGNOSIS — R296 Repeated falls: Secondary | ICD-10-CM | POA: Diagnosis not present

## 2021-06-23 DIAGNOSIS — R293 Abnormal posture: Secondary | ICD-10-CM | POA: Diagnosis not present

## 2021-06-23 DIAGNOSIS — F0390 Unspecified dementia without behavioral disturbance: Secondary | ICD-10-CM | POA: Diagnosis not present

## 2021-06-23 DIAGNOSIS — E785 Hyperlipidemia, unspecified: Secondary | ICD-10-CM | POA: Diagnosis not present

## 2021-06-23 DIAGNOSIS — G2409 Other drug induced dystonia: Secondary | ICD-10-CM | POA: Diagnosis not present

## 2021-06-23 DIAGNOSIS — N184 Chronic kidney disease, stage 4 (severe): Secondary | ICD-10-CM | POA: Diagnosis not present

## 2021-06-23 DIAGNOSIS — D500 Iron deficiency anemia secondary to blood loss (chronic): Secondary | ICD-10-CM | POA: Diagnosis not present

## 2021-06-23 DIAGNOSIS — H93013 Transient ischemic deafness, bilateral: Secondary | ICD-10-CM | POA: Diagnosis not present

## 2021-06-23 DIAGNOSIS — E119 Type 2 diabetes mellitus without complications: Secondary | ICD-10-CM | POA: Diagnosis not present

## 2021-06-23 DIAGNOSIS — I679 Cerebrovascular disease, unspecified: Secondary | ICD-10-CM | POA: Diagnosis not present

## 2021-06-23 DIAGNOSIS — F01511 Vascular dementia, unspecified severity, with agitation: Secondary | ICD-10-CM | POA: Diagnosis not present

## 2021-06-23 DIAGNOSIS — F39 Unspecified mood [affective] disorder: Secondary | ICD-10-CM | POA: Diagnosis not present

## 2021-06-23 DIAGNOSIS — M1990 Unspecified osteoarthritis, unspecified site: Secondary | ICD-10-CM | POA: Diagnosis not present

## 2021-06-23 DIAGNOSIS — E43 Unspecified severe protein-calorie malnutrition: Secondary | ICD-10-CM | POA: Diagnosis not present

## 2021-06-24 DIAGNOSIS — M1990 Unspecified osteoarthritis, unspecified site: Secondary | ICD-10-CM | POA: Diagnosis not present

## 2021-06-24 DIAGNOSIS — F39 Unspecified mood [affective] disorder: Secondary | ICD-10-CM | POA: Diagnosis not present

## 2021-06-24 DIAGNOSIS — E119 Type 2 diabetes mellitus without complications: Secondary | ICD-10-CM | POA: Diagnosis not present

## 2021-06-24 DIAGNOSIS — F0390 Unspecified dementia without behavioral disturbance: Secondary | ICD-10-CM | POA: Diagnosis not present

## 2021-06-24 DIAGNOSIS — R293 Abnormal posture: Secondary | ICD-10-CM | POA: Diagnosis not present

## 2021-06-24 DIAGNOSIS — D500 Iron deficiency anemia secondary to blood loss (chronic): Secondary | ICD-10-CM | POA: Diagnosis not present

## 2021-06-24 DIAGNOSIS — H93013 Transient ischemic deafness, bilateral: Secondary | ICD-10-CM | POA: Diagnosis not present

## 2021-06-24 DIAGNOSIS — I679 Cerebrovascular disease, unspecified: Secondary | ICD-10-CM | POA: Diagnosis not present

## 2021-06-24 DIAGNOSIS — R278 Other lack of coordination: Secondary | ICD-10-CM | POA: Diagnosis not present

## 2021-06-24 DIAGNOSIS — N184 Chronic kidney disease, stage 4 (severe): Secondary | ICD-10-CM | POA: Diagnosis not present

## 2021-06-24 DIAGNOSIS — E43 Unspecified severe protein-calorie malnutrition: Secondary | ICD-10-CM | POA: Diagnosis not present

## 2021-06-25 DIAGNOSIS — R293 Abnormal posture: Secondary | ICD-10-CM | POA: Diagnosis not present

## 2021-06-25 DIAGNOSIS — F0390 Unspecified dementia without behavioral disturbance: Secondary | ICD-10-CM | POA: Diagnosis not present

## 2021-06-25 DIAGNOSIS — M1990 Unspecified osteoarthritis, unspecified site: Secondary | ICD-10-CM | POA: Diagnosis not present

## 2021-06-25 DIAGNOSIS — I679 Cerebrovascular disease, unspecified: Secondary | ICD-10-CM | POA: Diagnosis not present

## 2021-06-25 DIAGNOSIS — D500 Iron deficiency anemia secondary to blood loss (chronic): Secondary | ICD-10-CM | POA: Diagnosis not present

## 2021-06-25 DIAGNOSIS — N184 Chronic kidney disease, stage 4 (severe): Secondary | ICD-10-CM | POA: Diagnosis not present

## 2021-06-25 DIAGNOSIS — H93013 Transient ischemic deafness, bilateral: Secondary | ICD-10-CM | POA: Diagnosis not present

## 2021-06-25 DIAGNOSIS — R278 Other lack of coordination: Secondary | ICD-10-CM | POA: Diagnosis not present

## 2021-06-25 DIAGNOSIS — E119 Type 2 diabetes mellitus without complications: Secondary | ICD-10-CM | POA: Diagnosis not present

## 2021-06-25 DIAGNOSIS — F39 Unspecified mood [affective] disorder: Secondary | ICD-10-CM | POA: Diagnosis not present

## 2021-06-25 DIAGNOSIS — E43 Unspecified severe protein-calorie malnutrition: Secondary | ICD-10-CM | POA: Diagnosis not present

## 2021-06-28 DIAGNOSIS — D500 Iron deficiency anemia secondary to blood loss (chronic): Secondary | ICD-10-CM | POA: Diagnosis not present

## 2021-06-28 DIAGNOSIS — E119 Type 2 diabetes mellitus without complications: Secondary | ICD-10-CM | POA: Diagnosis not present

## 2021-06-28 DIAGNOSIS — R293 Abnormal posture: Secondary | ICD-10-CM | POA: Diagnosis not present

## 2021-06-28 DIAGNOSIS — H93013 Transient ischemic deafness, bilateral: Secondary | ICD-10-CM | POA: Diagnosis not present

## 2021-06-28 DIAGNOSIS — F39 Unspecified mood [affective] disorder: Secondary | ICD-10-CM | POA: Diagnosis not present

## 2021-06-28 DIAGNOSIS — R278 Other lack of coordination: Secondary | ICD-10-CM | POA: Diagnosis not present

## 2021-06-28 DIAGNOSIS — F0390 Unspecified dementia without behavioral disturbance: Secondary | ICD-10-CM | POA: Diagnosis not present

## 2021-06-28 DIAGNOSIS — E43 Unspecified severe protein-calorie malnutrition: Secondary | ICD-10-CM | POA: Diagnosis not present

## 2021-06-28 DIAGNOSIS — N184 Chronic kidney disease, stage 4 (severe): Secondary | ICD-10-CM | POA: Diagnosis not present

## 2021-06-28 DIAGNOSIS — M1990 Unspecified osteoarthritis, unspecified site: Secondary | ICD-10-CM | POA: Diagnosis not present

## 2021-06-28 DIAGNOSIS — I679 Cerebrovascular disease, unspecified: Secondary | ICD-10-CM | POA: Diagnosis not present

## 2021-06-29 DIAGNOSIS — E43 Unspecified severe protein-calorie malnutrition: Secondary | ICD-10-CM | POA: Diagnosis not present

## 2021-06-29 DIAGNOSIS — F0390 Unspecified dementia without behavioral disturbance: Secondary | ICD-10-CM | POA: Diagnosis not present

## 2021-06-29 DIAGNOSIS — M1990 Unspecified osteoarthritis, unspecified site: Secondary | ICD-10-CM | POA: Diagnosis not present

## 2021-06-29 DIAGNOSIS — N184 Chronic kidney disease, stage 4 (severe): Secondary | ICD-10-CM | POA: Diagnosis not present

## 2021-06-29 DIAGNOSIS — D500 Iron deficiency anemia secondary to blood loss (chronic): Secondary | ICD-10-CM | POA: Diagnosis not present

## 2021-06-29 DIAGNOSIS — H93013 Transient ischemic deafness, bilateral: Secondary | ICD-10-CM | POA: Diagnosis not present

## 2021-06-29 DIAGNOSIS — R278 Other lack of coordination: Secondary | ICD-10-CM | POA: Diagnosis not present

## 2021-06-29 DIAGNOSIS — E119 Type 2 diabetes mellitus without complications: Secondary | ICD-10-CM | POA: Diagnosis not present

## 2021-06-29 DIAGNOSIS — F39 Unspecified mood [affective] disorder: Secondary | ICD-10-CM | POA: Diagnosis not present

## 2021-06-29 DIAGNOSIS — R293 Abnormal posture: Secondary | ICD-10-CM | POA: Diagnosis not present

## 2021-06-29 DIAGNOSIS — I679 Cerebrovascular disease, unspecified: Secondary | ICD-10-CM | POA: Diagnosis not present

## 2021-06-30 DIAGNOSIS — E119 Type 2 diabetes mellitus without complications: Secondary | ICD-10-CM | POA: Diagnosis not present

## 2021-06-30 DIAGNOSIS — F39 Unspecified mood [affective] disorder: Secondary | ICD-10-CM | POA: Diagnosis not present

## 2021-06-30 DIAGNOSIS — R293 Abnormal posture: Secondary | ICD-10-CM | POA: Diagnosis not present

## 2021-06-30 DIAGNOSIS — I679 Cerebrovascular disease, unspecified: Secondary | ICD-10-CM | POA: Diagnosis not present

## 2021-06-30 DIAGNOSIS — E43 Unspecified severe protein-calorie malnutrition: Secondary | ICD-10-CM | POA: Diagnosis not present

## 2021-06-30 DIAGNOSIS — M1990 Unspecified osteoarthritis, unspecified site: Secondary | ICD-10-CM | POA: Diagnosis not present

## 2021-06-30 DIAGNOSIS — H93013 Transient ischemic deafness, bilateral: Secondary | ICD-10-CM | POA: Diagnosis not present

## 2021-06-30 DIAGNOSIS — F0390 Unspecified dementia without behavioral disturbance: Secondary | ICD-10-CM | POA: Diagnosis not present

## 2021-06-30 DIAGNOSIS — N184 Chronic kidney disease, stage 4 (severe): Secondary | ICD-10-CM | POA: Diagnosis not present

## 2021-06-30 DIAGNOSIS — D500 Iron deficiency anemia secondary to blood loss (chronic): Secondary | ICD-10-CM | POA: Diagnosis not present

## 2021-06-30 DIAGNOSIS — R278 Other lack of coordination: Secondary | ICD-10-CM | POA: Diagnosis not present

## 2021-07-01 DIAGNOSIS — I679 Cerebrovascular disease, unspecified: Secondary | ICD-10-CM | POA: Diagnosis not present

## 2021-07-01 DIAGNOSIS — M1990 Unspecified osteoarthritis, unspecified site: Secondary | ICD-10-CM | POA: Diagnosis not present

## 2021-07-01 DIAGNOSIS — D500 Iron deficiency anemia secondary to blood loss (chronic): Secondary | ICD-10-CM | POA: Diagnosis not present

## 2021-07-01 DIAGNOSIS — F39 Unspecified mood [affective] disorder: Secondary | ICD-10-CM | POA: Diagnosis not present

## 2021-07-01 DIAGNOSIS — H93013 Transient ischemic deafness, bilateral: Secondary | ICD-10-CM | POA: Diagnosis not present

## 2021-07-01 DIAGNOSIS — R278 Other lack of coordination: Secondary | ICD-10-CM | POA: Diagnosis not present

## 2021-07-01 DIAGNOSIS — R293 Abnormal posture: Secondary | ICD-10-CM | POA: Diagnosis not present

## 2021-07-01 DIAGNOSIS — N184 Chronic kidney disease, stage 4 (severe): Secondary | ICD-10-CM | POA: Diagnosis not present

## 2021-07-01 DIAGNOSIS — F0390 Unspecified dementia without behavioral disturbance: Secondary | ICD-10-CM | POA: Diagnosis not present

## 2021-07-01 DIAGNOSIS — E43 Unspecified severe protein-calorie malnutrition: Secondary | ICD-10-CM | POA: Diagnosis not present

## 2021-07-01 DIAGNOSIS — E119 Type 2 diabetes mellitus without complications: Secondary | ICD-10-CM | POA: Diagnosis not present

## 2021-07-02 DIAGNOSIS — N184 Chronic kidney disease, stage 4 (severe): Secondary | ICD-10-CM | POA: Diagnosis not present

## 2021-07-02 DIAGNOSIS — E43 Unspecified severe protein-calorie malnutrition: Secondary | ICD-10-CM | POA: Diagnosis not present

## 2021-07-02 DIAGNOSIS — E119 Type 2 diabetes mellitus without complications: Secondary | ICD-10-CM | POA: Diagnosis not present

## 2021-07-02 DIAGNOSIS — D500 Iron deficiency anemia secondary to blood loss (chronic): Secondary | ICD-10-CM | POA: Diagnosis not present

## 2021-07-02 DIAGNOSIS — R278 Other lack of coordination: Secondary | ICD-10-CM | POA: Diagnosis not present

## 2021-07-02 DIAGNOSIS — F0390 Unspecified dementia without behavioral disturbance: Secondary | ICD-10-CM | POA: Diagnosis not present

## 2021-07-02 DIAGNOSIS — F39 Unspecified mood [affective] disorder: Secondary | ICD-10-CM | POA: Diagnosis not present

## 2021-07-02 DIAGNOSIS — R293 Abnormal posture: Secondary | ICD-10-CM | POA: Diagnosis not present

## 2021-07-02 DIAGNOSIS — I679 Cerebrovascular disease, unspecified: Secondary | ICD-10-CM | POA: Diagnosis not present

## 2021-07-02 DIAGNOSIS — H93013 Transient ischemic deafness, bilateral: Secondary | ICD-10-CM | POA: Diagnosis not present

## 2021-07-02 DIAGNOSIS — M1990 Unspecified osteoarthritis, unspecified site: Secondary | ICD-10-CM | POA: Diagnosis not present

## 2021-07-05 DIAGNOSIS — M1990 Unspecified osteoarthritis, unspecified site: Secondary | ICD-10-CM | POA: Diagnosis not present

## 2021-07-05 DIAGNOSIS — H93013 Transient ischemic deafness, bilateral: Secondary | ICD-10-CM | POA: Diagnosis not present

## 2021-07-05 DIAGNOSIS — E119 Type 2 diabetes mellitus without complications: Secondary | ICD-10-CM | POA: Diagnosis not present

## 2021-07-05 DIAGNOSIS — R293 Abnormal posture: Secondary | ICD-10-CM | POA: Diagnosis not present

## 2021-07-05 DIAGNOSIS — F39 Unspecified mood [affective] disorder: Secondary | ICD-10-CM | POA: Diagnosis not present

## 2021-07-05 DIAGNOSIS — D500 Iron deficiency anemia secondary to blood loss (chronic): Secondary | ICD-10-CM | POA: Diagnosis not present

## 2021-07-05 DIAGNOSIS — N184 Chronic kidney disease, stage 4 (severe): Secondary | ICD-10-CM | POA: Diagnosis not present

## 2021-07-05 DIAGNOSIS — E43 Unspecified severe protein-calorie malnutrition: Secondary | ICD-10-CM | POA: Diagnosis not present

## 2021-07-05 DIAGNOSIS — I679 Cerebrovascular disease, unspecified: Secondary | ICD-10-CM | POA: Diagnosis not present

## 2021-07-05 DIAGNOSIS — R278 Other lack of coordination: Secondary | ICD-10-CM | POA: Diagnosis not present

## 2021-07-05 DIAGNOSIS — F0390 Unspecified dementia without behavioral disturbance: Secondary | ICD-10-CM | POA: Diagnosis not present

## 2021-07-06 DIAGNOSIS — F0390 Unspecified dementia without behavioral disturbance: Secondary | ICD-10-CM | POA: Diagnosis not present

## 2021-07-06 DIAGNOSIS — I679 Cerebrovascular disease, unspecified: Secondary | ICD-10-CM | POA: Diagnosis not present

## 2021-07-06 DIAGNOSIS — E43 Unspecified severe protein-calorie malnutrition: Secondary | ICD-10-CM | POA: Diagnosis not present

## 2021-07-06 DIAGNOSIS — E119 Type 2 diabetes mellitus without complications: Secondary | ICD-10-CM | POA: Diagnosis not present

## 2021-07-06 DIAGNOSIS — H93013 Transient ischemic deafness, bilateral: Secondary | ICD-10-CM | POA: Diagnosis not present

## 2021-07-06 DIAGNOSIS — F39 Unspecified mood [affective] disorder: Secondary | ICD-10-CM | POA: Diagnosis not present

## 2021-07-06 DIAGNOSIS — R293 Abnormal posture: Secondary | ICD-10-CM | POA: Diagnosis not present

## 2021-07-06 DIAGNOSIS — D500 Iron deficiency anemia secondary to blood loss (chronic): Secondary | ICD-10-CM | POA: Diagnosis not present

## 2021-07-06 DIAGNOSIS — M1990 Unspecified osteoarthritis, unspecified site: Secondary | ICD-10-CM | POA: Diagnosis not present

## 2021-07-06 DIAGNOSIS — R278 Other lack of coordination: Secondary | ICD-10-CM | POA: Diagnosis not present

## 2021-07-06 DIAGNOSIS — N184 Chronic kidney disease, stage 4 (severe): Secondary | ICD-10-CM | POA: Diagnosis not present

## 2021-07-07 DIAGNOSIS — N184 Chronic kidney disease, stage 4 (severe): Secondary | ICD-10-CM | POA: Diagnosis not present

## 2021-07-07 DIAGNOSIS — D500 Iron deficiency anemia secondary to blood loss (chronic): Secondary | ICD-10-CM | POA: Diagnosis not present

## 2021-07-07 DIAGNOSIS — I679 Cerebrovascular disease, unspecified: Secondary | ICD-10-CM | POA: Diagnosis not present

## 2021-07-07 DIAGNOSIS — F39 Unspecified mood [affective] disorder: Secondary | ICD-10-CM | POA: Diagnosis not present

## 2021-07-07 DIAGNOSIS — E43 Unspecified severe protein-calorie malnutrition: Secondary | ICD-10-CM | POA: Diagnosis not present

## 2021-07-07 DIAGNOSIS — E119 Type 2 diabetes mellitus without complications: Secondary | ICD-10-CM | POA: Diagnosis not present

## 2021-07-08 DIAGNOSIS — E43 Unspecified severe protein-calorie malnutrition: Secondary | ICD-10-CM | POA: Diagnosis not present

## 2021-07-08 DIAGNOSIS — F39 Unspecified mood [affective] disorder: Secondary | ICD-10-CM | POA: Diagnosis not present

## 2021-07-08 DIAGNOSIS — D500 Iron deficiency anemia secondary to blood loss (chronic): Secondary | ICD-10-CM | POA: Diagnosis not present

## 2021-07-08 DIAGNOSIS — E119 Type 2 diabetes mellitus without complications: Secondary | ICD-10-CM | POA: Diagnosis not present

## 2021-07-08 DIAGNOSIS — I679 Cerebrovascular disease, unspecified: Secondary | ICD-10-CM | POA: Diagnosis not present

## 2021-07-08 DIAGNOSIS — N184 Chronic kidney disease, stage 4 (severe): Secondary | ICD-10-CM | POA: Diagnosis not present

## 2021-07-09 ENCOUNTER — Encounter (HOSPITAL_BASED_OUTPATIENT_CLINIC_OR_DEPARTMENT_OTHER): Payer: Self-pay | Admitting: *Deleted

## 2021-07-09 ENCOUNTER — Emergency Department (HOSPITAL_BASED_OUTPATIENT_CLINIC_OR_DEPARTMENT_OTHER)
Admission: EM | Admit: 2021-07-09 | Discharge: 2021-07-09 | Disposition: A | Discharge status: Home / Self Care | Payer: Medicare Other | Attending: Emergency Medicine | Admitting: Emergency Medicine

## 2021-07-09 ENCOUNTER — Other Ambulatory Visit: Payer: Self-pay

## 2021-07-09 ENCOUNTER — Emergency Department (HOSPITAL_BASED_OUTPATIENT_CLINIC_OR_DEPARTMENT_OTHER): Payer: Medicare Other

## 2021-07-09 DIAGNOSIS — I10 Essential (primary) hypertension: Secondary | ICD-10-CM | POA: Insufficient documentation

## 2021-07-09 DIAGNOSIS — S0990XA Unspecified injury of head, initial encounter: Secondary | ICD-10-CM

## 2021-07-09 DIAGNOSIS — Z79899 Other long term (current) drug therapy: Secondary | ICD-10-CM | POA: Diagnosis not present

## 2021-07-09 DIAGNOSIS — S0181XA Laceration without foreign body of other part of head, initial encounter: Secondary | ICD-10-CM | POA: Insufficient documentation

## 2021-07-09 DIAGNOSIS — N184 Chronic kidney disease, stage 4 (severe): Secondary | ICD-10-CM | POA: Diagnosis not present

## 2021-07-09 DIAGNOSIS — D500 Iron deficiency anemia secondary to blood loss (chronic): Secondary | ICD-10-CM | POA: Diagnosis not present

## 2021-07-09 DIAGNOSIS — F0390 Unspecified dementia without behavioral disturbance: Secondary | ICD-10-CM | POA: Diagnosis not present

## 2021-07-09 DIAGNOSIS — Z043 Encounter for examination and observation following other accident: Secondary | ICD-10-CM | POA: Diagnosis not present

## 2021-07-09 DIAGNOSIS — W19XXXA Unspecified fall, initial encounter: Secondary | ICD-10-CM | POA: Insufficient documentation

## 2021-07-09 DIAGNOSIS — Y92129 Unspecified place in nursing home as the place of occurrence of the external cause: Secondary | ICD-10-CM | POA: Diagnosis not present

## 2021-07-09 DIAGNOSIS — S0081XA Abrasion of other part of head, initial encounter: Secondary | ICD-10-CM | POA: Diagnosis not present

## 2021-07-09 DIAGNOSIS — E43 Unspecified severe protein-calorie malnutrition: Secondary | ICD-10-CM | POA: Diagnosis not present

## 2021-07-09 DIAGNOSIS — F39 Unspecified mood [affective] disorder: Secondary | ICD-10-CM | POA: Diagnosis not present

## 2021-07-09 DIAGNOSIS — Z7982 Long term (current) use of aspirin: Secondary | ICD-10-CM | POA: Insufficient documentation

## 2021-07-09 DIAGNOSIS — I679 Cerebrovascular disease, unspecified: Secondary | ICD-10-CM | POA: Diagnosis not present

## 2021-07-09 DIAGNOSIS — E119 Type 2 diabetes mellitus without complications: Secondary | ICD-10-CM | POA: Diagnosis not present

## 2021-07-09 DIAGNOSIS — E11649 Type 2 diabetes mellitus with hypoglycemia without coma: Secondary | ICD-10-CM | POA: Diagnosis not present

## 2021-07-09 DIAGNOSIS — M25511 Pain in right shoulder: Secondary | ICD-10-CM | POA: Diagnosis not present

## 2021-07-09 DIAGNOSIS — E162 Hypoglycemia, unspecified: Secondary | ICD-10-CM

## 2021-07-09 LAB — COMPREHENSIVE METABOLIC PANEL
ALT: 6 U/L (ref 0–44)
AST: 14 U/L — ABNORMAL LOW (ref 15–41)
Albumin: 3.8 g/dL (ref 3.5–5.0)
Alkaline Phosphatase: 85 U/L (ref 38–126)
Anion gap: 8 (ref 5–15)
BUN: 64 mg/dL — ABNORMAL HIGH (ref 8–23)
CO2: 26 mmol/L (ref 22–32)
Calcium: 9.6 mg/dL (ref 8.9–10.3)
Chloride: 101 mmol/L (ref 98–111)
Creatinine, Ser: 2.12 mg/dL — ABNORMAL HIGH (ref 0.44–1.00)
GFR, Estimated: 22 mL/min — ABNORMAL LOW (ref >60)
Glucose, Bld: 91 mg/dL (ref 70–99)
Potassium: 4.6 mmol/L (ref 3.5–5.1)
Sodium: 135 mmol/L (ref 135–145)
Total Bilirubin: 0.3 mg/dL (ref 0.3–1.2)
Total Protein: 7.5 g/dL (ref 6.5–8.1)

## 2021-07-09 LAB — URINALYSIS, ROUTINE W REFLEX MICROSCOPIC
Bilirubin Urine: NEGATIVE
Glucose, UA: NEGATIVE mg/dL
Hgb urine dipstick: NEGATIVE
Ketones, ur: NEGATIVE mg/dL
Nitrite: NEGATIVE
Protein, ur: 30 mg/dL — AB
Specific Gravity, Urine: 1.012 (ref 1.005–1.030)
pH: 7 (ref 5.0–8.0)

## 2021-07-09 LAB — CBC WITH DIFFERENTIAL/PLATELET
Abs Immature Granulocytes: 0 10*3/uL (ref 0.00–0.07)
Basophils Absolute: 0 10*3/uL (ref 0.0–0.1)
Basophils Relative: 1 %
Eosinophils Absolute: 0.1 10*3/uL (ref 0.0–0.5)
Eosinophils Relative: 3 %
HCT: 28.1 % — ABNORMAL LOW (ref 36.0–46.0)
Hemoglobin: 9.1 g/dL — ABNORMAL LOW (ref 12.0–15.0)
Immature Granulocytes: 0 %
Lymphocytes Relative: 33 %
Lymphs Abs: 1.3 10*3/uL (ref 0.7–4.0)
MCH: 33 pg (ref 26.0–34.0)
MCHC: 32.4 g/dL (ref 30.0–36.0)
MCV: 101.8 fL — ABNORMAL HIGH (ref 80.0–100.0)
Monocytes Absolute: 0.4 10*3/uL (ref 0.1–1.0)
Monocytes Relative: 11 %
Neutro Abs: 2.1 10*3/uL (ref 1.7–7.7)
Neutrophils Relative %: 52 %
Platelets: 277 10*3/uL (ref 150–400)
RBC: 2.76 MIL/uL — ABNORMAL LOW (ref 3.87–5.11)
RDW: 12.5 % (ref 11.5–15.5)
WBC: 4 10*3/uL (ref 4.0–10.5)
nRBC: 0 % (ref 0.0–0.2)

## 2021-07-09 LAB — CBG MONITORING, ED
Glucose-Capillary: 61 mg/dL — ABNORMAL LOW (ref 70–99)
Glucose-Capillary: 104 mg/dL — ABNORMAL HIGH (ref 70–99)

## 2021-07-09 MED ORDER — DEXTROSE 50 % IV SOLN
50.0000 mL | Freq: Once | INTRAVENOUS | Status: AC
  Administered 2021-07-09: 50 mL via INTRAVENOUS
  Filled 2021-07-09: qty 50

## 2021-07-09 NOTE — ED Notes (Signed)
Attempted to call [place] but was unsuccessful  ?

## 2021-07-09 NOTE — Discharge Instructions (Addendum)
You have been evaluated for your fall.  Fortunately no evidence of any broken bones or significant injury.  You do have a skin tear to your forehead, keep a dressing over the wound and monitor for any signs of infection.  You may take over-the-counter Tylenol as needed for pain.  Please noted that your blood pressure is high today and will need to be rechecked by your doctor.  Your blood sugar was low.  Low blood sugar can potentially cause your fall.  Please eat and drink appropriately and monitor your blood sugar carefully.  Return to the ER if you have any concern. ?

## 2021-07-09 NOTE — ED Notes (Signed)
MD aware of VS and okay to discharge back to facility  ?

## 2021-07-09 NOTE — ED Triage Notes (Addendum)
Pt arrives by [facility] for witnessed fall at [facility] from a seated position in [facility].  No LOC, pt has small injuryon forehead (abrasion).  Bleeding controlled.  Pt is not in any distress.  Pt is deaf and uses american sign language, we brought wally in on arrival but son who is POA and at bedside did not want this and said he would be translating.  ?

## 2021-07-09 NOTE — ED Notes (Signed)
MD made aware of BG 61 // orders placed ?

## 2021-07-09 NOTE — ED Provider Notes (Signed)
?[facility] ?Provider Note ? ? ?CSN: [id number] ?Arrival date & time: 07/09/21  1711 ? ?  ? ?History ? ?Chief Complaint  ?Patient presents with  ? Fall  ? ? ?[name] is a 88 y.o. female. ? ?The history is provided by the patient, a relative and medical records. No language interpreter was used.  ?Fall ? ? ? [name] is an 88 year old female with a pertinent history of prior falls, severe right shoulder arthritis, HTN, dementia, and deafness who presents to the ED by EMS from her nursing facility for a witnessed fall. Her son acts as her Optometrist and primary source of history after refusing Psychologist, sport and exercise. Patient had a witnessed fall out of her non-moving wheelchair today around 2:00 PM. Patient fell forward and did not catch herself, subsequently hitting her head on the floor. She did not lose consciousness. Patient is not on a blood thinner and denies history of osteoporosis. Son states that he saw her 2 days prior to the fall and directly after the fall and endorses mild changes in behavior such as decreased responsiveness. Denies headache, chest pain, abdominal pain, N/V.  ? ?Home Medications ?Prior to Admission medications   ?Medication Sig Start Date End Date Taking? Authorizing Provider  ?Ascorbic Acid (VITAMIN C) 500 MG CAPS 1 capsule DAILY (route: oral) 08/07/20   [provider]  ?aspirin 81 MG chewable tablet Chew 81 mg by mouth daily. (0700)    [provider]  ?B Complex Vitamins (B COMPLEX 1 PO) 1 tablet DAILY (route: oral) 08/07/20   [provider]  ?benztropine (COGENTIN) 0.5 MG tablet Take 0.5 mg by mouth 2 (two) times daily. 07/16/20   [provider]  ?brimonidine-timolol (COMBIGAN) 0.2-0.5 % ophthalmic solution Place 1 drop into both eyes 2 (two) times daily. (Separate from other eye drops by at least 10 minutes)    [provider]  ?carvedilol (COREG) 25 MG tablet Take 25 mg by mouth 2 (two) times  daily. (0700 & 1900)    [provider]  ?cloNIDine (CATAPRES) 0.3 MG tablet Take 1 tablet (0.3 mg total) by mouth 2 (two) times daily. (0700) 10/19/19   Patrecia Pour, MD  ?Dextrose, Diabetic Use, (INSTA-GLUCOSE PO) Take 1 Dose by mouth daily as needed (for blood sugar less than 70--recheck in 15 minutes.).    [provider]  ?diclofenac Sodium (VOLTAREN) 1 % GEL Per instructions 2 TIMES DAILY (route: topical) 08/07/20   [provider]  ?divalproex (DEPAKOTE) 125 MG DR tablet 1 tablet 2 TIMES DAILY (route: oral) 08/20/20   [provider]  ?docusate sodium (COLACE) 100 MG capsule Take 100 mg by mouth daily. (0700)    [provider]  ?donepezil (ARICEPT) 10 MG tablet Take 10 mg by mouth at bedtime. (1900)    [provider]  ?doxazosin (CARDURA) 8 MG tablet Take 16 mg by mouth at bedtime. (1900)    [provider]  ?escitalopram (LEXAPRO) 10 MG tablet Take 10 mg by mouth daily. (0700)    [provider]  ?ferrous sulfate 325 (65 FE) MG tablet Take 325 mg by mouth daily. (0700)    [provider]  ?Glycerin-Hypromellose-PEG 400 0.2-0.2-1 % SOLN Place 1 drop into both eyes 2 (two) times daily. (1200 and 1900)    [provider]  ?hydrOXYzine (ATARAX/VISTARIL) 10 MG tablet Take 10 mg by mouth 2 (two) times daily. 07/16/20   [provider]  ?ILEVRO 0.3 % ophthalmic suspension Place  1 drop into the left eye daily.  08/02/19   [provider]  ?lovastatin (MEVACOR) 10 MG tablet Take 10 mg by mouth at bedtime. 09/05/19   [provider]  ?mirtazapine (REMERON) 15 MG tablet Take 15 mg by mouth at bedtime.     [provider]  ?Nutritional Supplements (NUTRITIONAL DRINK PO) Take 1 each by mouth in the morning and at bedtime.    [provider]  ?Skin Protectants, Misc. (MINERIN CREME) CREA SPREAD TOPICALLY TO FEET ONCE DAILY **NOT BETWEEN TOES* 10/14/19   [provider]  ?traZODone  (DESYREL) 50 MG tablet Take 50 mg by mouth at bedtime. (1900)    [provider]  ?   ? ?Allergies    ?Patient has no known allergies.   ? ?Review of Systems   ?Review of Systems  ?All other systems reviewed and are negative. ? ?Physical Exam ?Updated Vital Signs ?BP (!) 183/118 (BP Location: Left Arm)   Pulse 65   Temp 97.8 ?F (36.6 ?C) (Oral)   Resp 16   SpO2 99%  ?Physical Exam ?Vitals and nursing note reviewed.  ?Constitutional:   ?   General: She is not in acute distress. ?   Appearance: She is well-developed.  ?HENT:  ?   Head: Normocephalic.  ?   Comments: Small skin tear noted to the mid upper forehead near the scalp with mild tenderness to palpation but no crepitus noted. ?Eyes:  ?   Conjunctiva/sclera: Conjunctivae normal.  ?Cardiovascular:  ?   Rate and Rhythm: Normal rate and regular rhythm.  ?   Pulses: Normal pulses.  ?   Heart sounds: Normal heart sounds.  ?Pulmonary:  ?   Effort: Pulmonary effort is normal.  ?Abdominal:  ?   Palpations: Abdomen is soft.  ?   Tenderness: There is no abdominal tenderness.  ?Musculoskeletal:     ?   General: Tenderness (R shouder: tenderness to palpation, no obvious deformity noted.) present.  ?   Cervical back: Neck supple.  ?   Comments: No significant midline spine tenderness  ?Skin: ?   Findings: No rash.  ?Neurological:  ?   Mental Status: She is alert. Mental status is at baseline.  ?Psychiatric:     ?   Mood and Affect: Mood normal.  ? ? ?ED Results / Procedures / Treatments   ?Labs ?(all labs ordered are listed, but only abnormal results are displayed) ?Labs Reviewed  ?CBC WITH DIFFERENTIAL/PLATELET - Abnormal; Notable for the following components:  ?    Result Value  ? RBC 2.76 (*)   ? Hemoglobin 9.1 (*)   ? HCT 28.1 (*)   ? MCV 101.8 (*)   ? All other components within normal limits  ?COMPREHENSIVE METABOLIC PANEL - Abnormal; Notable for the following components:  ? BUN 64 (*)   ? Creatinine, Ser 2.12 (*)   ? AST 14 (*)   ? GFR, Estimated 22 (*)    ? All other components within normal limits  ?URINALYSIS, ROUTINE W REFLEX MICROSCOPIC - Abnormal; Notable for the following components:  ? Protein, ur 30 (*)   ? Leukocytes,Ua TRACE (*)   ? Bacteria, UA RARE (*)   ? All other components within normal limits  ?CBG MONITORING, ED - Abnormal; Notable for the following components:  ? Glucose-Capillary 61 (*)   ? All other components within normal limits  ?CBG MONITORING, ED - Abnormal; Notable for the following components:  ? Glucose-Capillary 104 (*)   ?  All other components within normal limits  ? ? ?EKG ?EKG Interpretation ? ?Date/Time:  Friday July 09 2021 18:24:58 EDT ?Ventricular Rate:  65 ?PR Interval:  163 ?QRS Duration: 101 ?QT Interval:  442 ?QTC Calculation: 460 ?R Axis:   155 ?Text Interpretation: Right and left arm electrode reversal, interpretation assumes no reversal Sinus rhythm Right axis deviation Abnormal T, consider ischemia, lateral leads Limb reversal we will have him repeat Confirmed by Fredia Sorrow [id number]) on 07/09/2021 6:29:34 PM ? ?Radiology ?DG Shoulder Right ? ?Result Date: 07/09/2021 ?CLINICAL DATA:  Fall, complains of right shoulder pain. EXAM: RIGHT SHOULDER - 2+ VIEW COMPARISON:  Radiographs dated October 07, 2019; chest radiograph dated August 04, 2020 FINDINGS: There is advanced glenohumeral osteoarthritis with prominent marginal osteophytes. Compared to prior radiograph there is interval flattening of the humeral head as well as progression of the diffuse osteopenia. There is soft tissue calcification about the glenoid as well as lateral aspect of the humerus. Osteolysis of the distal clavicle. IMPRESSION: 1. No acute displaced fracture. Diffuse osteopenia limits evaluation. 2. Interval progression of the osteoarthritis of the glenohumeral joint with soft tissue calcification about the glenoid and lateral aspect of the humerus. Osteolysis of the distal clavicle. Progression of arthropathy could be secondary to disuse or  infectious/inflammatory process. Further evaluation with CT examination could be considered if clinically warranted. Electronically Signed   By: Keane Police D.O.   On: 07/09/2021 19:35  ? ?CT Head Wo Contrast ? ?Result Date

## 2021-07-09 NOTE — ED Notes (Signed)
Son at bedside, uses [place] sign language. Pt able to understand and interact back.  ?States that pt had witnessed fall today at facility. No LOC. No blood thinners. Small scratch noted to left forehead. Bleeding Controlled. C/o back and right shoulder pain.  ?

## 2021-07-09 NOTE — ED Notes (Signed)
Attempted to set up transport for pt to go back to [facility] but son did not want to wait for transport due to the long wait time  ?

## 2021-07-12 DIAGNOSIS — I679 Cerebrovascular disease, unspecified: Secondary | ICD-10-CM | POA: Diagnosis not present

## 2021-07-12 DIAGNOSIS — R296 Repeated falls: Secondary | ICD-10-CM | POA: Diagnosis not present

## 2021-07-12 DIAGNOSIS — E119 Type 2 diabetes mellitus without complications: Secondary | ICD-10-CM | POA: Diagnosis not present

## 2021-07-12 DIAGNOSIS — R030 Elevated blood-pressure reading, without diagnosis of hypertension: Secondary | ICD-10-CM | POA: Diagnosis not present

## 2021-07-12 DIAGNOSIS — E162 Hypoglycemia, unspecified: Secondary | ICD-10-CM | POA: Diagnosis not present

## 2021-07-12 DIAGNOSIS — D500 Iron deficiency anemia secondary to blood loss (chronic): Secondary | ICD-10-CM | POA: Diagnosis not present

## 2021-07-12 DIAGNOSIS — N184 Chronic kidney disease, stage 4 (severe): Secondary | ICD-10-CM | POA: Diagnosis not present

## 2021-07-12 DIAGNOSIS — E43 Unspecified severe protein-calorie malnutrition: Secondary | ICD-10-CM | POA: Diagnosis not present

## 2021-07-12 DIAGNOSIS — F39 Unspecified mood [affective] disorder: Secondary | ICD-10-CM | POA: Diagnosis not present

## 2021-07-13 DIAGNOSIS — E43 Unspecified severe protein-calorie malnutrition: Secondary | ICD-10-CM | POA: Diagnosis not present

## 2021-07-13 DIAGNOSIS — I679 Cerebrovascular disease, unspecified: Secondary | ICD-10-CM | POA: Diagnosis not present

## 2021-07-13 DIAGNOSIS — E119 Type 2 diabetes mellitus without complications: Secondary | ICD-10-CM | POA: Diagnosis not present

## 2021-07-13 DIAGNOSIS — N184 Chronic kidney disease, stage 4 (severe): Secondary | ICD-10-CM | POA: Diagnosis not present

## 2021-07-13 DIAGNOSIS — D500 Iron deficiency anemia secondary to blood loss (chronic): Secondary | ICD-10-CM | POA: Diagnosis not present

## 2021-07-13 DIAGNOSIS — F39 Unspecified mood [affective] disorder: Secondary | ICD-10-CM | POA: Diagnosis not present

## 2021-07-14 DIAGNOSIS — E119 Type 2 diabetes mellitus without complications: Secondary | ICD-10-CM | POA: Diagnosis not present

## 2021-07-14 DIAGNOSIS — R296 Repeated falls: Secondary | ICD-10-CM | POA: Diagnosis not present

## 2021-07-14 DIAGNOSIS — D500 Iron deficiency anemia secondary to blood loss (chronic): Secondary | ICD-10-CM | POA: Diagnosis not present

## 2021-07-14 DIAGNOSIS — F0390 Unspecified dementia without behavioral disturbance: Secondary | ICD-10-CM | POA: Diagnosis not present

## 2021-07-14 DIAGNOSIS — F39 Unspecified mood [affective] disorder: Secondary | ICD-10-CM | POA: Diagnosis not present

## 2021-07-14 DIAGNOSIS — N184 Chronic kidney disease, stage 4 (severe): Secondary | ICD-10-CM | POA: Diagnosis not present

## 2021-07-14 DIAGNOSIS — I679 Cerebrovascular disease, unspecified: Secondary | ICD-10-CM | POA: Diagnosis not present

## 2021-07-14 DIAGNOSIS — E43 Unspecified severe protein-calorie malnutrition: Secondary | ICD-10-CM | POA: Diagnosis not present

## 2021-07-15 DIAGNOSIS — N184 Chronic kidney disease, stage 4 (severe): Secondary | ICD-10-CM | POA: Diagnosis not present

## 2021-07-15 DIAGNOSIS — D500 Iron deficiency anemia secondary to blood loss (chronic): Secondary | ICD-10-CM | POA: Diagnosis not present

## 2021-07-15 DIAGNOSIS — F39 Unspecified mood [affective] disorder: Secondary | ICD-10-CM | POA: Diagnosis not present

## 2021-07-15 DIAGNOSIS — E119 Type 2 diabetes mellitus without complications: Secondary | ICD-10-CM | POA: Diagnosis not present

## 2021-07-15 DIAGNOSIS — E43 Unspecified severe protein-calorie malnutrition: Secondary | ICD-10-CM | POA: Diagnosis not present

## 2021-07-15 DIAGNOSIS — I679 Cerebrovascular disease, unspecified: Secondary | ICD-10-CM | POA: Diagnosis not present

## 2021-07-16 DIAGNOSIS — E119 Type 2 diabetes mellitus without complications: Secondary | ICD-10-CM | POA: Diagnosis not present

## 2021-07-16 DIAGNOSIS — I679 Cerebrovascular disease, unspecified: Secondary | ICD-10-CM | POA: Diagnosis not present

## 2021-07-16 DIAGNOSIS — N184 Chronic kidney disease, stage 4 (severe): Secondary | ICD-10-CM | POA: Diagnosis not present

## 2021-07-16 DIAGNOSIS — F39 Unspecified mood [affective] disorder: Secondary | ICD-10-CM | POA: Diagnosis not present

## 2021-07-16 DIAGNOSIS — D500 Iron deficiency anemia secondary to blood loss (chronic): Secondary | ICD-10-CM | POA: Diagnosis not present

## 2021-07-16 DIAGNOSIS — E43 Unspecified severe protein-calorie malnutrition: Secondary | ICD-10-CM | POA: Diagnosis not present

## 2021-07-19 DIAGNOSIS — D500 Iron deficiency anemia secondary to blood loss (chronic): Secondary | ICD-10-CM | POA: Diagnosis not present

## 2021-07-19 DIAGNOSIS — I679 Cerebrovascular disease, unspecified: Secondary | ICD-10-CM | POA: Diagnosis not present

## 2021-07-19 DIAGNOSIS — F39 Unspecified mood [affective] disorder: Secondary | ICD-10-CM | POA: Diagnosis not present

## 2021-07-19 DIAGNOSIS — E43 Unspecified severe protein-calorie malnutrition: Secondary | ICD-10-CM | POA: Diagnosis not present

## 2021-07-19 DIAGNOSIS — N184 Chronic kidney disease, stage 4 (severe): Secondary | ICD-10-CM | POA: Diagnosis not present

## 2021-07-19 DIAGNOSIS — E119 Type 2 diabetes mellitus without complications: Secondary | ICD-10-CM | POA: Diagnosis not present

## 2021-07-20 DIAGNOSIS — I679 Cerebrovascular disease, unspecified: Secondary | ICD-10-CM | POA: Diagnosis not present

## 2021-07-20 DIAGNOSIS — E43 Unspecified severe protein-calorie malnutrition: Secondary | ICD-10-CM | POA: Diagnosis not present

## 2021-07-20 DIAGNOSIS — E119 Type 2 diabetes mellitus without complications: Secondary | ICD-10-CM | POA: Diagnosis not present

## 2021-07-20 DIAGNOSIS — F39 Unspecified mood [affective] disorder: Secondary | ICD-10-CM | POA: Diagnosis not present

## 2021-07-20 DIAGNOSIS — D500 Iron deficiency anemia secondary to blood loss (chronic): Secondary | ICD-10-CM | POA: Diagnosis not present

## 2021-07-20 DIAGNOSIS — N184 Chronic kidney disease, stage 4 (severe): Secondary | ICD-10-CM | POA: Diagnosis not present

## 2021-07-21 DIAGNOSIS — H119 Unspecified disorder of conjunctiva: Secondary | ICD-10-CM | POA: Diagnosis not present

## 2021-07-21 DIAGNOSIS — E43 Unspecified severe protein-calorie malnutrition: Secondary | ICD-10-CM | POA: Diagnosis not present

## 2021-07-21 DIAGNOSIS — H409 Unspecified glaucoma: Secondary | ICD-10-CM | POA: Diagnosis not present

## 2021-07-21 DIAGNOSIS — F39 Unspecified mood [affective] disorder: Secondary | ICD-10-CM | POA: Diagnosis not present

## 2021-07-21 DIAGNOSIS — N184 Chronic kidney disease, stage 4 (severe): Secondary | ICD-10-CM | POA: Diagnosis not present

## 2021-07-21 DIAGNOSIS — E119 Type 2 diabetes mellitus without complications: Secondary | ICD-10-CM | POA: Diagnosis not present

## 2021-07-21 DIAGNOSIS — D500 Iron deficiency anemia secondary to blood loss (chronic): Secondary | ICD-10-CM | POA: Diagnosis not present

## 2021-07-21 DIAGNOSIS — I679 Cerebrovascular disease, unspecified: Secondary | ICD-10-CM | POA: Diagnosis not present

## 2021-07-21 DIAGNOSIS — F03C0 Unspecified dementia, severe, without behavioral disturbance, psychotic disturbance, mood disturbance, and anxiety: Secondary | ICD-10-CM | POA: Diagnosis not present

## 2021-07-22 DIAGNOSIS — D500 Iron deficiency anemia secondary to blood loss (chronic): Secondary | ICD-10-CM | POA: Diagnosis not present

## 2021-07-22 DIAGNOSIS — E119 Type 2 diabetes mellitus without complications: Secondary | ICD-10-CM | POA: Diagnosis not present

## 2021-07-22 DIAGNOSIS — I679 Cerebrovascular disease, unspecified: Secondary | ICD-10-CM | POA: Diagnosis not present

## 2021-07-22 DIAGNOSIS — F39 Unspecified mood [affective] disorder: Secondary | ICD-10-CM | POA: Diagnosis not present

## 2021-07-22 DIAGNOSIS — E43 Unspecified severe protein-calorie malnutrition: Secondary | ICD-10-CM | POA: Diagnosis not present

## 2021-07-22 DIAGNOSIS — N184 Chronic kidney disease, stage 4 (severe): Secondary | ICD-10-CM | POA: Diagnosis not present

## 2021-07-23 DIAGNOSIS — E43 Unspecified severe protein-calorie malnutrition: Secondary | ICD-10-CM | POA: Diagnosis not present

## 2021-07-23 DIAGNOSIS — F39 Unspecified mood [affective] disorder: Secondary | ICD-10-CM | POA: Diagnosis not present

## 2021-07-23 DIAGNOSIS — I679 Cerebrovascular disease, unspecified: Secondary | ICD-10-CM | POA: Diagnosis not present

## 2021-07-23 DIAGNOSIS — E119 Type 2 diabetes mellitus without complications: Secondary | ICD-10-CM | POA: Diagnosis not present

## 2021-07-23 DIAGNOSIS — D500 Iron deficiency anemia secondary to blood loss (chronic): Secondary | ICD-10-CM | POA: Diagnosis not present

## 2021-07-23 DIAGNOSIS — N184 Chronic kidney disease, stage 4 (severe): Secondary | ICD-10-CM | POA: Diagnosis not present

## 2021-07-24 DIAGNOSIS — I679 Cerebrovascular disease, unspecified: Secondary | ICD-10-CM | POA: Diagnosis not present

## 2021-07-24 DIAGNOSIS — Z8616 Personal history of COVID-19: Secondary | ICD-10-CM | POA: Diagnosis not present

## 2021-07-24 DIAGNOSIS — F39 Unspecified mood [affective] disorder: Secondary | ICD-10-CM | POA: Diagnosis not present

## 2021-07-24 DIAGNOSIS — E785 Hyperlipidemia, unspecified: Secondary | ICD-10-CM | POA: Diagnosis not present

## 2021-07-24 DIAGNOSIS — N184 Chronic kidney disease, stage 4 (severe): Secondary | ICD-10-CM | POA: Diagnosis not present

## 2021-07-24 DIAGNOSIS — E43 Unspecified severe protein-calorie malnutrition: Secondary | ICD-10-CM | POA: Diagnosis not present

## 2021-07-24 DIAGNOSIS — D500 Iron deficiency anemia secondary to blood loss (chronic): Secondary | ICD-10-CM | POA: Diagnosis not present

## 2021-07-24 DIAGNOSIS — G2409 Other drug induced dystonia: Secondary | ICD-10-CM | POA: Diagnosis not present

## 2021-07-24 DIAGNOSIS — F01511 Vascular dementia, unspecified severity, with agitation: Secondary | ICD-10-CM | POA: Diagnosis not present

## 2021-07-24 DIAGNOSIS — M1990 Unspecified osteoarthritis, unspecified site: Secondary | ICD-10-CM | POA: Diagnosis not present

## 2021-07-24 DIAGNOSIS — E118 Type 2 diabetes mellitus with unspecified complications: Secondary | ICD-10-CM | POA: Diagnosis not present

## 2021-07-26 DIAGNOSIS — Z8616 Personal history of COVID-19: Secondary | ICD-10-CM | POA: Diagnosis not present

## 2021-07-26 DIAGNOSIS — D539 Nutritional anemia, unspecified: Secondary | ICD-10-CM | POA: Diagnosis not present

## 2021-07-26 DIAGNOSIS — R0989 Other specified symptoms and signs involving the circulatory and respiratory systems: Secondary | ICD-10-CM | POA: Diagnosis not present

## 2021-07-26 DIAGNOSIS — R262 Difficulty in walking, not elsewhere classified: Secondary | ICD-10-CM | POA: Diagnosis not present

## 2021-07-26 DIAGNOSIS — E43 Unspecified severe protein-calorie malnutrition: Secondary | ICD-10-CM | POA: Diagnosis not present

## 2021-07-26 DIAGNOSIS — I679 Cerebrovascular disease, unspecified: Secondary | ICD-10-CM | POA: Diagnosis not present

## 2021-07-26 DIAGNOSIS — F03C0 Unspecified dementia, severe, without behavioral disturbance, psychotic disturbance, mood disturbance, and anxiety: Secondary | ICD-10-CM | POA: Diagnosis not present

## 2021-07-26 DIAGNOSIS — N184 Chronic kidney disease, stage 4 (severe): Secondary | ICD-10-CM | POA: Diagnosis not present

## 2021-07-26 DIAGNOSIS — M5090 Cervical disc disorder, unspecified, unspecified cervical region: Secondary | ICD-10-CM | POA: Diagnosis not present

## 2021-07-26 DIAGNOSIS — D500 Iron deficiency anemia secondary to blood loss (chronic): Secondary | ICD-10-CM | POA: Diagnosis not present

## 2021-07-26 DIAGNOSIS — F39 Unspecified mood [affective] disorder: Secondary | ICD-10-CM | POA: Diagnosis not present

## 2021-07-27 DIAGNOSIS — I679 Cerebrovascular disease, unspecified: Secondary | ICD-10-CM | POA: Diagnosis not present

## 2021-07-27 DIAGNOSIS — F39 Unspecified mood [affective] disorder: Secondary | ICD-10-CM | POA: Diagnosis not present

## 2021-07-27 DIAGNOSIS — N184 Chronic kidney disease, stage 4 (severe): Secondary | ICD-10-CM | POA: Diagnosis not present

## 2021-07-27 DIAGNOSIS — D500 Iron deficiency anemia secondary to blood loss (chronic): Secondary | ICD-10-CM | POA: Diagnosis not present

## 2021-07-27 DIAGNOSIS — E43 Unspecified severe protein-calorie malnutrition: Secondary | ICD-10-CM | POA: Diagnosis not present

## 2021-07-27 DIAGNOSIS — Z8616 Personal history of COVID-19: Secondary | ICD-10-CM | POA: Diagnosis not present

## 2021-07-28 DIAGNOSIS — I679 Cerebrovascular disease, unspecified: Secondary | ICD-10-CM | POA: Diagnosis not present

## 2021-07-28 DIAGNOSIS — F39 Unspecified mood [affective] disorder: Secondary | ICD-10-CM | POA: Diagnosis not present

## 2021-07-28 DIAGNOSIS — E43 Unspecified severe protein-calorie malnutrition: Secondary | ICD-10-CM | POA: Diagnosis not present

## 2021-07-28 DIAGNOSIS — Z8616 Personal history of COVID-19: Secondary | ICD-10-CM | POA: Diagnosis not present

## 2021-07-28 DIAGNOSIS — H409 Unspecified glaucoma: Secondary | ICD-10-CM | POA: Diagnosis not present

## 2021-07-28 DIAGNOSIS — D500 Iron deficiency anemia secondary to blood loss (chronic): Secondary | ICD-10-CM | POA: Diagnosis not present

## 2021-07-28 DIAGNOSIS — N184 Chronic kidney disease, stage 4 (severe): Secondary | ICD-10-CM | POA: Diagnosis not present

## 2021-07-28 DIAGNOSIS — F03C0 Unspecified dementia, severe, without behavioral disturbance, psychotic disturbance, mood disturbance, and anxiety: Secondary | ICD-10-CM | POA: Diagnosis not present

## 2021-07-28 DIAGNOSIS — H1010 Acute atopic conjunctivitis, unspecified eye: Secondary | ICD-10-CM | POA: Diagnosis not present

## 2021-07-29 DIAGNOSIS — N184 Chronic kidney disease, stage 4 (severe): Secondary | ICD-10-CM | POA: Diagnosis not present

## 2021-07-29 DIAGNOSIS — Z8616 Personal history of COVID-19: Secondary | ICD-10-CM | POA: Diagnosis not present

## 2021-07-29 DIAGNOSIS — D500 Iron deficiency anemia secondary to blood loss (chronic): Secondary | ICD-10-CM | POA: Diagnosis not present

## 2021-07-29 DIAGNOSIS — I679 Cerebrovascular disease, unspecified: Secondary | ICD-10-CM | POA: Diagnosis not present

## 2021-07-29 DIAGNOSIS — E43 Unspecified severe protein-calorie malnutrition: Secondary | ICD-10-CM | POA: Diagnosis not present

## 2021-07-29 DIAGNOSIS — F39 Unspecified mood [affective] disorder: Secondary | ICD-10-CM | POA: Diagnosis not present

## 2021-07-30 DIAGNOSIS — I679 Cerebrovascular disease, unspecified: Secondary | ICD-10-CM | POA: Diagnosis not present

## 2021-07-30 DIAGNOSIS — E43 Unspecified severe protein-calorie malnutrition: Secondary | ICD-10-CM | POA: Diagnosis not present

## 2021-07-30 DIAGNOSIS — D500 Iron deficiency anemia secondary to blood loss (chronic): Secondary | ICD-10-CM | POA: Diagnosis not present

## 2021-07-30 DIAGNOSIS — N184 Chronic kidney disease, stage 4 (severe): Secondary | ICD-10-CM | POA: Diagnosis not present

## 2021-07-30 DIAGNOSIS — Z8616 Personal history of COVID-19: Secondary | ICD-10-CM | POA: Diagnosis not present

## 2021-07-30 DIAGNOSIS — F39 Unspecified mood [affective] disorder: Secondary | ICD-10-CM | POA: Diagnosis not present

## 2021-08-02 DIAGNOSIS — F39 Unspecified mood [affective] disorder: Secondary | ICD-10-CM | POA: Diagnosis not present

## 2021-08-02 DIAGNOSIS — E43 Unspecified severe protein-calorie malnutrition: Secondary | ICD-10-CM | POA: Diagnosis not present

## 2021-08-02 DIAGNOSIS — I679 Cerebrovascular disease, unspecified: Secondary | ICD-10-CM | POA: Diagnosis not present

## 2021-08-02 DIAGNOSIS — D500 Iron deficiency anemia secondary to blood loss (chronic): Secondary | ICD-10-CM | POA: Diagnosis not present

## 2021-08-02 DIAGNOSIS — Z8616 Personal history of COVID-19: Secondary | ICD-10-CM | POA: Diagnosis not present

## 2021-08-02 DIAGNOSIS — N184 Chronic kidney disease, stage 4 (severe): Secondary | ICD-10-CM | POA: Diagnosis not present

## 2021-08-03 DIAGNOSIS — D500 Iron deficiency anemia secondary to blood loss (chronic): Secondary | ICD-10-CM | POA: Diagnosis not present

## 2021-08-03 DIAGNOSIS — N184 Chronic kidney disease, stage 4 (severe): Secondary | ICD-10-CM | POA: Diagnosis not present

## 2021-08-03 DIAGNOSIS — I679 Cerebrovascular disease, unspecified: Secondary | ICD-10-CM | POA: Diagnosis not present

## 2021-08-03 DIAGNOSIS — F39 Unspecified mood [affective] disorder: Secondary | ICD-10-CM | POA: Diagnosis not present

## 2021-08-03 DIAGNOSIS — E43 Unspecified severe protein-calorie malnutrition: Secondary | ICD-10-CM | POA: Diagnosis not present

## 2021-08-03 DIAGNOSIS — Z8616 Personal history of COVID-19: Secondary | ICD-10-CM | POA: Diagnosis not present

## 2021-08-04 DIAGNOSIS — E43 Unspecified severe protein-calorie malnutrition: Secondary | ICD-10-CM | POA: Diagnosis not present

## 2021-08-04 DIAGNOSIS — I679 Cerebrovascular disease, unspecified: Secondary | ICD-10-CM | POA: Diagnosis not present

## 2021-08-04 DIAGNOSIS — F39 Unspecified mood [affective] disorder: Secondary | ICD-10-CM | POA: Diagnosis not present

## 2021-08-04 DIAGNOSIS — Z8616 Personal history of COVID-19: Secondary | ICD-10-CM | POA: Diagnosis not present

## 2021-08-04 DIAGNOSIS — N184 Chronic kidney disease, stage 4 (severe): Secondary | ICD-10-CM | POA: Diagnosis not present

## 2021-08-04 DIAGNOSIS — D500 Iron deficiency anemia secondary to blood loss (chronic): Secondary | ICD-10-CM | POA: Diagnosis not present

## 2021-08-05 DIAGNOSIS — F39 Unspecified mood [affective] disorder: Secondary | ICD-10-CM | POA: Diagnosis not present

## 2021-08-05 DIAGNOSIS — I679 Cerebrovascular disease, unspecified: Secondary | ICD-10-CM | POA: Diagnosis not present

## 2021-08-05 DIAGNOSIS — N184 Chronic kidney disease, stage 4 (severe): Secondary | ICD-10-CM | POA: Diagnosis not present

## 2021-08-05 DIAGNOSIS — D500 Iron deficiency anemia secondary to blood loss (chronic): Secondary | ICD-10-CM | POA: Diagnosis not present

## 2021-08-05 DIAGNOSIS — Z8616 Personal history of COVID-19: Secondary | ICD-10-CM | POA: Diagnosis not present

## 2021-08-05 DIAGNOSIS — E43 Unspecified severe protein-calorie malnutrition: Secondary | ICD-10-CM | POA: Diagnosis not present

## 2021-08-09 DIAGNOSIS — E43 Unspecified severe protein-calorie malnutrition: Secondary | ICD-10-CM | POA: Diagnosis not present

## 2021-08-09 DIAGNOSIS — Z8616 Personal history of COVID-19: Secondary | ICD-10-CM | POA: Diagnosis not present

## 2021-08-09 DIAGNOSIS — N184 Chronic kidney disease, stage 4 (severe): Secondary | ICD-10-CM | POA: Diagnosis not present

## 2021-08-09 DIAGNOSIS — I679 Cerebrovascular disease, unspecified: Secondary | ICD-10-CM | POA: Diagnosis not present

## 2021-08-09 DIAGNOSIS — D500 Iron deficiency anemia secondary to blood loss (chronic): Secondary | ICD-10-CM | POA: Diagnosis not present

## 2021-08-09 DIAGNOSIS — F39 Unspecified mood [affective] disorder: Secondary | ICD-10-CM | POA: Diagnosis not present

## 2021-08-11 DIAGNOSIS — E43 Unspecified severe protein-calorie malnutrition: Secondary | ICD-10-CM | POA: Diagnosis not present

## 2021-08-11 DIAGNOSIS — D500 Iron deficiency anemia secondary to blood loss (chronic): Secondary | ICD-10-CM | POA: Diagnosis not present

## 2021-08-11 DIAGNOSIS — Z8616 Personal history of COVID-19: Secondary | ICD-10-CM | POA: Diagnosis not present

## 2021-08-11 DIAGNOSIS — N184 Chronic kidney disease, stage 4 (severe): Secondary | ICD-10-CM | POA: Diagnosis not present

## 2021-08-11 DIAGNOSIS — F39 Unspecified mood [affective] disorder: Secondary | ICD-10-CM | POA: Diagnosis not present

## 2021-08-11 DIAGNOSIS — I679 Cerebrovascular disease, unspecified: Secondary | ICD-10-CM | POA: Diagnosis not present

## 2021-08-13 DIAGNOSIS — I679 Cerebrovascular disease, unspecified: Secondary | ICD-10-CM | POA: Diagnosis not present

## 2021-08-13 DIAGNOSIS — Z8616 Personal history of COVID-19: Secondary | ICD-10-CM | POA: Diagnosis not present

## 2021-08-13 DIAGNOSIS — N184 Chronic kidney disease, stage 4 (severe): Secondary | ICD-10-CM | POA: Diagnosis not present

## 2021-08-13 DIAGNOSIS — E43 Unspecified severe protein-calorie malnutrition: Secondary | ICD-10-CM | POA: Diagnosis not present

## 2021-08-13 DIAGNOSIS — D500 Iron deficiency anemia secondary to blood loss (chronic): Secondary | ICD-10-CM | POA: Diagnosis not present

## 2021-08-13 DIAGNOSIS — F39 Unspecified mood [affective] disorder: Secondary | ICD-10-CM | POA: Diagnosis not present

## 2021-08-16 DIAGNOSIS — N184 Chronic kidney disease, stage 4 (severe): Secondary | ICD-10-CM | POA: Diagnosis not present

## 2021-08-16 DIAGNOSIS — I679 Cerebrovascular disease, unspecified: Secondary | ICD-10-CM | POA: Diagnosis not present

## 2021-08-16 DIAGNOSIS — F39 Unspecified mood [affective] disorder: Secondary | ICD-10-CM | POA: Diagnosis not present

## 2021-08-16 DIAGNOSIS — Z8616 Personal history of COVID-19: Secondary | ICD-10-CM | POA: Diagnosis not present

## 2021-08-16 DIAGNOSIS — D500 Iron deficiency anemia secondary to blood loss (chronic): Secondary | ICD-10-CM | POA: Diagnosis not present

## 2021-08-16 DIAGNOSIS — E43 Unspecified severe protein-calorie malnutrition: Secondary | ICD-10-CM | POA: Diagnosis not present

## 2021-08-17 DIAGNOSIS — D500 Iron deficiency anemia secondary to blood loss (chronic): Secondary | ICD-10-CM | POA: Diagnosis not present

## 2021-08-17 DIAGNOSIS — I679 Cerebrovascular disease, unspecified: Secondary | ICD-10-CM | POA: Diagnosis not present

## 2021-08-17 DIAGNOSIS — N184 Chronic kidney disease, stage 4 (severe): Secondary | ICD-10-CM | POA: Diagnosis not present

## 2021-08-17 DIAGNOSIS — F39 Unspecified mood [affective] disorder: Secondary | ICD-10-CM | POA: Diagnosis not present

## 2021-08-17 DIAGNOSIS — Z8616 Personal history of COVID-19: Secondary | ICD-10-CM | POA: Diagnosis not present

## 2021-08-17 DIAGNOSIS — E43 Unspecified severe protein-calorie malnutrition: Secondary | ICD-10-CM | POA: Diagnosis not present

## 2021-08-18 DIAGNOSIS — D500 Iron deficiency anemia secondary to blood loss (chronic): Secondary | ICD-10-CM | POA: Diagnosis not present

## 2021-08-18 DIAGNOSIS — I679 Cerebrovascular disease, unspecified: Secondary | ICD-10-CM | POA: Diagnosis not present

## 2021-08-18 DIAGNOSIS — Z8616 Personal history of COVID-19: Secondary | ICD-10-CM | POA: Diagnosis not present

## 2021-08-18 DIAGNOSIS — N184 Chronic kidney disease, stage 4 (severe): Secondary | ICD-10-CM | POA: Diagnosis not present

## 2021-08-18 DIAGNOSIS — E43 Unspecified severe protein-calorie malnutrition: Secondary | ICD-10-CM | POA: Diagnosis not present

## 2021-08-18 DIAGNOSIS — F39 Unspecified mood [affective] disorder: Secondary | ICD-10-CM | POA: Diagnosis not present

## 2021-08-19 DIAGNOSIS — E43 Unspecified severe protein-calorie malnutrition: Secondary | ICD-10-CM | POA: Diagnosis not present

## 2021-08-19 DIAGNOSIS — F39 Unspecified mood [affective] disorder: Secondary | ICD-10-CM | POA: Diagnosis not present

## 2021-08-19 DIAGNOSIS — D500 Iron deficiency anemia secondary to blood loss (chronic): Secondary | ICD-10-CM | POA: Diagnosis not present

## 2021-08-19 DIAGNOSIS — N184 Chronic kidney disease, stage 4 (severe): Secondary | ICD-10-CM | POA: Diagnosis not present

## 2021-08-19 DIAGNOSIS — I679 Cerebrovascular disease, unspecified: Secondary | ICD-10-CM | POA: Diagnosis not present

## 2021-08-19 DIAGNOSIS — Z8616 Personal history of COVID-19: Secondary | ICD-10-CM | POA: Diagnosis not present

## 2021-08-23 DIAGNOSIS — N184 Chronic kidney disease, stage 4 (severe): Secondary | ICD-10-CM | POA: Diagnosis not present

## 2021-08-23 DIAGNOSIS — M1990 Unspecified osteoarthritis, unspecified site: Secondary | ICD-10-CM | POA: Diagnosis not present

## 2021-08-23 DIAGNOSIS — E43 Unspecified severe protein-calorie malnutrition: Secondary | ICD-10-CM | POA: Diagnosis not present

## 2021-08-23 DIAGNOSIS — D500 Iron deficiency anemia secondary to blood loss (chronic): Secondary | ICD-10-CM | POA: Diagnosis not present

## 2021-08-23 DIAGNOSIS — G2409 Other drug induced dystonia: Secondary | ICD-10-CM | POA: Diagnosis not present

## 2021-08-23 DIAGNOSIS — F01511 Vascular dementia, unspecified severity, with agitation: Secondary | ICD-10-CM | POA: Diagnosis not present

## 2021-08-23 DIAGNOSIS — I679 Cerebrovascular disease, unspecified: Secondary | ICD-10-CM | POA: Diagnosis not present

## 2021-08-23 DIAGNOSIS — E118 Type 2 diabetes mellitus with unspecified complications: Secondary | ICD-10-CM | POA: Diagnosis not present

## 2021-08-23 DIAGNOSIS — F39 Unspecified mood [affective] disorder: Secondary | ICD-10-CM | POA: Diagnosis not present

## 2021-08-23 DIAGNOSIS — E785 Hyperlipidemia, unspecified: Secondary | ICD-10-CM | POA: Diagnosis not present

## 2021-08-23 DIAGNOSIS — Z8616 Personal history of COVID-19: Secondary | ICD-10-CM | POA: Diagnosis not present

## 2021-08-24 DIAGNOSIS — I679 Cerebrovascular disease, unspecified: Secondary | ICD-10-CM | POA: Diagnosis not present

## 2021-08-24 DIAGNOSIS — N184 Chronic kidney disease, stage 4 (severe): Secondary | ICD-10-CM | POA: Diagnosis not present

## 2021-08-24 DIAGNOSIS — D500 Iron deficiency anemia secondary to blood loss (chronic): Secondary | ICD-10-CM | POA: Diagnosis not present

## 2021-08-24 DIAGNOSIS — E43 Unspecified severe protein-calorie malnutrition: Secondary | ICD-10-CM | POA: Diagnosis not present

## 2021-08-24 DIAGNOSIS — F39 Unspecified mood [affective] disorder: Secondary | ICD-10-CM | POA: Diagnosis not present

## 2021-08-24 DIAGNOSIS — Z8616 Personal history of COVID-19: Secondary | ICD-10-CM | POA: Diagnosis not present

## 2021-08-25 DIAGNOSIS — I679 Cerebrovascular disease, unspecified: Secondary | ICD-10-CM | POA: Diagnosis not present

## 2021-08-25 DIAGNOSIS — Z8616 Personal history of COVID-19: Secondary | ICD-10-CM | POA: Diagnosis not present

## 2021-08-25 DIAGNOSIS — E43 Unspecified severe protein-calorie malnutrition: Secondary | ICD-10-CM | POA: Diagnosis not present

## 2021-08-25 DIAGNOSIS — D500 Iron deficiency anemia secondary to blood loss (chronic): Secondary | ICD-10-CM | POA: Diagnosis not present

## 2021-08-25 DIAGNOSIS — F39 Unspecified mood [affective] disorder: Secondary | ICD-10-CM | POA: Diagnosis not present

## 2021-08-25 DIAGNOSIS — N184 Chronic kidney disease, stage 4 (severe): Secondary | ICD-10-CM | POA: Diagnosis not present

## 2021-08-26 DIAGNOSIS — Z8616 Personal history of COVID-19: Secondary | ICD-10-CM | POA: Diagnosis not present

## 2021-08-26 DIAGNOSIS — N184 Chronic kidney disease, stage 4 (severe): Secondary | ICD-10-CM | POA: Diagnosis not present

## 2021-08-26 DIAGNOSIS — D500 Iron deficiency anemia secondary to blood loss (chronic): Secondary | ICD-10-CM | POA: Diagnosis not present

## 2021-08-26 DIAGNOSIS — F39 Unspecified mood [affective] disorder: Secondary | ICD-10-CM | POA: Diagnosis not present

## 2021-08-26 DIAGNOSIS — I679 Cerebrovascular disease, unspecified: Secondary | ICD-10-CM | POA: Diagnosis not present

## 2021-08-26 DIAGNOSIS — E43 Unspecified severe protein-calorie malnutrition: Secondary | ICD-10-CM | POA: Diagnosis not present

## 2021-08-30 DIAGNOSIS — N184 Chronic kidney disease, stage 4 (severe): Secondary | ICD-10-CM | POA: Diagnosis not present

## 2021-08-30 DIAGNOSIS — E43 Unspecified severe protein-calorie malnutrition: Secondary | ICD-10-CM | POA: Diagnosis not present

## 2021-08-30 DIAGNOSIS — Z8616 Personal history of COVID-19: Secondary | ICD-10-CM | POA: Diagnosis not present

## 2021-08-30 DIAGNOSIS — F39 Unspecified mood [affective] disorder: Secondary | ICD-10-CM | POA: Diagnosis not present

## 2021-08-30 DIAGNOSIS — I679 Cerebrovascular disease, unspecified: Secondary | ICD-10-CM | POA: Diagnosis not present

## 2021-08-30 DIAGNOSIS — D500 Iron deficiency anemia secondary to blood loss (chronic): Secondary | ICD-10-CM | POA: Diagnosis not present

## 2021-08-31 DIAGNOSIS — Z8616 Personal history of COVID-19: Secondary | ICD-10-CM | POA: Diagnosis not present

## 2021-08-31 DIAGNOSIS — E43 Unspecified severe protein-calorie malnutrition: Secondary | ICD-10-CM | POA: Diagnosis not present

## 2021-08-31 DIAGNOSIS — I679 Cerebrovascular disease, unspecified: Secondary | ICD-10-CM | POA: Diagnosis not present

## 2021-08-31 DIAGNOSIS — F39 Unspecified mood [affective] disorder: Secondary | ICD-10-CM | POA: Diagnosis not present

## 2021-08-31 DIAGNOSIS — D500 Iron deficiency anemia secondary to blood loss (chronic): Secondary | ICD-10-CM | POA: Diagnosis not present

## 2021-08-31 DIAGNOSIS — N184 Chronic kidney disease, stage 4 (severe): Secondary | ICD-10-CM | POA: Diagnosis not present

## 2021-09-01 DIAGNOSIS — N184 Chronic kidney disease, stage 4 (severe): Secondary | ICD-10-CM | POA: Diagnosis not present

## 2021-09-01 DIAGNOSIS — Z8616 Personal history of COVID-19: Secondary | ICD-10-CM | POA: Diagnosis not present

## 2021-09-01 DIAGNOSIS — F39 Unspecified mood [affective] disorder: Secondary | ICD-10-CM | POA: Diagnosis not present

## 2021-09-01 DIAGNOSIS — I679 Cerebrovascular disease, unspecified: Secondary | ICD-10-CM | POA: Diagnosis not present

## 2021-09-01 DIAGNOSIS — D500 Iron deficiency anemia secondary to blood loss (chronic): Secondary | ICD-10-CM | POA: Diagnosis not present

## 2021-09-01 DIAGNOSIS — E43 Unspecified severe protein-calorie malnutrition: Secondary | ICD-10-CM | POA: Diagnosis not present

## 2021-09-02 DIAGNOSIS — E43 Unspecified severe protein-calorie malnutrition: Secondary | ICD-10-CM | POA: Diagnosis not present

## 2021-09-02 DIAGNOSIS — F39 Unspecified mood [affective] disorder: Secondary | ICD-10-CM | POA: Diagnosis not present

## 2021-09-02 DIAGNOSIS — Z8616 Personal history of COVID-19: Secondary | ICD-10-CM | POA: Diagnosis not present

## 2021-09-02 DIAGNOSIS — N184 Chronic kidney disease, stage 4 (severe): Secondary | ICD-10-CM | POA: Diagnosis not present

## 2021-09-02 DIAGNOSIS — I679 Cerebrovascular disease, unspecified: Secondary | ICD-10-CM | POA: Diagnosis not present

## 2021-09-02 DIAGNOSIS — D500 Iron deficiency anemia secondary to blood loss (chronic): Secondary | ICD-10-CM | POA: Diagnosis not present

## 2021-09-03 DIAGNOSIS — E43 Unspecified severe protein-calorie malnutrition: Secondary | ICD-10-CM | POA: Diagnosis not present

## 2021-09-03 DIAGNOSIS — Z8616 Personal history of COVID-19: Secondary | ICD-10-CM | POA: Diagnosis not present

## 2021-09-03 DIAGNOSIS — N184 Chronic kidney disease, stage 4 (severe): Secondary | ICD-10-CM | POA: Diagnosis not present

## 2021-09-03 DIAGNOSIS — I679 Cerebrovascular disease, unspecified: Secondary | ICD-10-CM | POA: Diagnosis not present

## 2021-09-03 DIAGNOSIS — F39 Unspecified mood [affective] disorder: Secondary | ICD-10-CM | POA: Diagnosis not present

## 2021-09-03 DIAGNOSIS — D500 Iron deficiency anemia secondary to blood loss (chronic): Secondary | ICD-10-CM | POA: Diagnosis not present

## 2021-09-06 DIAGNOSIS — E43 Unspecified severe protein-calorie malnutrition: Secondary | ICD-10-CM | POA: Diagnosis not present

## 2021-09-06 DIAGNOSIS — F39 Unspecified mood [affective] disorder: Secondary | ICD-10-CM | POA: Diagnosis not present

## 2021-09-06 DIAGNOSIS — Z8616 Personal history of COVID-19: Secondary | ICD-10-CM | POA: Diagnosis not present

## 2021-09-06 DIAGNOSIS — N184 Chronic kidney disease, stage 4 (severe): Secondary | ICD-10-CM | POA: Diagnosis not present

## 2021-09-06 DIAGNOSIS — D500 Iron deficiency anemia secondary to blood loss (chronic): Secondary | ICD-10-CM | POA: Diagnosis not present

## 2021-09-06 DIAGNOSIS — I679 Cerebrovascular disease, unspecified: Secondary | ICD-10-CM | POA: Diagnosis not present

## 2021-09-07 DIAGNOSIS — D500 Iron deficiency anemia secondary to blood loss (chronic): Secondary | ICD-10-CM | POA: Diagnosis not present

## 2021-09-07 DIAGNOSIS — N184 Chronic kidney disease, stage 4 (severe): Secondary | ICD-10-CM | POA: Diagnosis not present

## 2021-09-07 DIAGNOSIS — E43 Unspecified severe protein-calorie malnutrition: Secondary | ICD-10-CM | POA: Diagnosis not present

## 2021-09-07 DIAGNOSIS — I679 Cerebrovascular disease, unspecified: Secondary | ICD-10-CM | POA: Diagnosis not present

## 2021-09-07 DIAGNOSIS — F39 Unspecified mood [affective] disorder: Secondary | ICD-10-CM | POA: Diagnosis not present

## 2021-09-07 DIAGNOSIS — Z8616 Personal history of COVID-19: Secondary | ICD-10-CM | POA: Diagnosis not present

## 2021-09-08 DIAGNOSIS — F39 Unspecified mood [affective] disorder: Secondary | ICD-10-CM | POA: Diagnosis not present

## 2021-09-08 DIAGNOSIS — I679 Cerebrovascular disease, unspecified: Secondary | ICD-10-CM | POA: Diagnosis not present

## 2021-09-08 DIAGNOSIS — E43 Unspecified severe protein-calorie malnutrition: Secondary | ICD-10-CM | POA: Diagnosis not present

## 2021-09-08 DIAGNOSIS — N184 Chronic kidney disease, stage 4 (severe): Secondary | ICD-10-CM | POA: Diagnosis not present

## 2021-09-08 DIAGNOSIS — D500 Iron deficiency anemia secondary to blood loss (chronic): Secondary | ICD-10-CM | POA: Diagnosis not present

## 2021-09-08 DIAGNOSIS — L853 Xerosis cutis: Secondary | ICD-10-CM | POA: Diagnosis not present

## 2021-09-08 DIAGNOSIS — Z8616 Personal history of COVID-19: Secondary | ICD-10-CM | POA: Diagnosis not present

## 2021-09-08 DIAGNOSIS — R600 Localized edema: Secondary | ICD-10-CM | POA: Diagnosis not present

## 2021-09-08 DIAGNOSIS — L84 Corns and callosities: Secondary | ICD-10-CM | POA: Diagnosis not present

## 2021-09-09 DIAGNOSIS — D500 Iron deficiency anemia secondary to blood loss (chronic): Secondary | ICD-10-CM | POA: Diagnosis not present

## 2021-09-09 DIAGNOSIS — N184 Chronic kidney disease, stage 4 (severe): Secondary | ICD-10-CM | POA: Diagnosis not present

## 2021-09-09 DIAGNOSIS — Z8616 Personal history of COVID-19: Secondary | ICD-10-CM | POA: Diagnosis not present

## 2021-09-09 DIAGNOSIS — E43 Unspecified severe protein-calorie malnutrition: Secondary | ICD-10-CM | POA: Diagnosis not present

## 2021-09-09 DIAGNOSIS — F39 Unspecified mood [affective] disorder: Secondary | ICD-10-CM | POA: Diagnosis not present

## 2021-09-09 DIAGNOSIS — I679 Cerebrovascular disease, unspecified: Secondary | ICD-10-CM | POA: Diagnosis not present

## 2021-09-10 DIAGNOSIS — I679 Cerebrovascular disease, unspecified: Secondary | ICD-10-CM | POA: Diagnosis not present

## 2021-09-10 DIAGNOSIS — N184 Chronic kidney disease, stage 4 (severe): Secondary | ICD-10-CM | POA: Diagnosis not present

## 2021-09-10 DIAGNOSIS — E43 Unspecified severe protein-calorie malnutrition: Secondary | ICD-10-CM | POA: Diagnosis not present

## 2021-09-10 DIAGNOSIS — Z8616 Personal history of COVID-19: Secondary | ICD-10-CM | POA: Diagnosis not present

## 2021-09-10 DIAGNOSIS — D500 Iron deficiency anemia secondary to blood loss (chronic): Secondary | ICD-10-CM | POA: Diagnosis not present

## 2021-09-10 DIAGNOSIS — F39 Unspecified mood [affective] disorder: Secondary | ICD-10-CM | POA: Diagnosis not present

## 2021-09-13 DIAGNOSIS — N184 Chronic kidney disease, stage 4 (severe): Secondary | ICD-10-CM | POA: Diagnosis not present

## 2021-09-13 DIAGNOSIS — D500 Iron deficiency anemia secondary to blood loss (chronic): Secondary | ICD-10-CM | POA: Diagnosis not present

## 2021-09-13 DIAGNOSIS — E43 Unspecified severe protein-calorie malnutrition: Secondary | ICD-10-CM | POA: Diagnosis not present

## 2021-09-13 DIAGNOSIS — I679 Cerebrovascular disease, unspecified: Secondary | ICD-10-CM | POA: Diagnosis not present

## 2021-09-13 DIAGNOSIS — Z8616 Personal history of COVID-19: Secondary | ICD-10-CM | POA: Diagnosis not present

## 2021-09-13 DIAGNOSIS — F39 Unspecified mood [affective] disorder: Secondary | ICD-10-CM | POA: Diagnosis not present

## 2021-09-14 DIAGNOSIS — F39 Unspecified mood [affective] disorder: Secondary | ICD-10-CM | POA: Diagnosis not present

## 2021-09-14 DIAGNOSIS — Z8616 Personal history of COVID-19: Secondary | ICD-10-CM | POA: Diagnosis not present

## 2021-09-14 DIAGNOSIS — N184 Chronic kidney disease, stage 4 (severe): Secondary | ICD-10-CM | POA: Diagnosis not present

## 2021-09-14 DIAGNOSIS — E43 Unspecified severe protein-calorie malnutrition: Secondary | ICD-10-CM | POA: Diagnosis not present

## 2021-09-14 DIAGNOSIS — I679 Cerebrovascular disease, unspecified: Secondary | ICD-10-CM | POA: Diagnosis not present

## 2021-09-14 DIAGNOSIS — D500 Iron deficiency anemia secondary to blood loss (chronic): Secondary | ICD-10-CM | POA: Diagnosis not present

## 2021-09-15 DIAGNOSIS — E43 Unspecified severe protein-calorie malnutrition: Secondary | ICD-10-CM | POA: Diagnosis not present

## 2021-09-15 DIAGNOSIS — D500 Iron deficiency anemia secondary to blood loss (chronic): Secondary | ICD-10-CM | POA: Diagnosis not present

## 2021-09-15 DIAGNOSIS — I679 Cerebrovascular disease, unspecified: Secondary | ICD-10-CM | POA: Diagnosis not present

## 2021-09-15 DIAGNOSIS — Z8616 Personal history of COVID-19: Secondary | ICD-10-CM | POA: Diagnosis not present

## 2021-09-15 DIAGNOSIS — F39 Unspecified mood [affective] disorder: Secondary | ICD-10-CM | POA: Diagnosis not present

## 2021-09-15 DIAGNOSIS — N184 Chronic kidney disease, stage 4 (severe): Secondary | ICD-10-CM | POA: Diagnosis not present

## 2021-09-16 DIAGNOSIS — Z8616 Personal history of COVID-19: Secondary | ICD-10-CM | POA: Diagnosis not present

## 2021-09-16 DIAGNOSIS — I679 Cerebrovascular disease, unspecified: Secondary | ICD-10-CM | POA: Diagnosis not present

## 2021-09-16 DIAGNOSIS — D500 Iron deficiency anemia secondary to blood loss (chronic): Secondary | ICD-10-CM | POA: Diagnosis not present

## 2021-09-16 DIAGNOSIS — N184 Chronic kidney disease, stage 4 (severe): Secondary | ICD-10-CM | POA: Diagnosis not present

## 2021-09-16 DIAGNOSIS — E43 Unspecified severe protein-calorie malnutrition: Secondary | ICD-10-CM | POA: Diagnosis not present

## 2021-09-16 DIAGNOSIS — F39 Unspecified mood [affective] disorder: Secondary | ICD-10-CM | POA: Diagnosis not present

## 2021-09-21 DIAGNOSIS — N184 Chronic kidney disease, stage 4 (severe): Secondary | ICD-10-CM | POA: Diagnosis not present

## 2021-09-21 DIAGNOSIS — Z8616 Personal history of COVID-19: Secondary | ICD-10-CM | POA: Diagnosis not present

## 2021-09-21 DIAGNOSIS — F39 Unspecified mood [affective] disorder: Secondary | ICD-10-CM | POA: Diagnosis not present

## 2021-09-21 DIAGNOSIS — E43 Unspecified severe protein-calorie malnutrition: Secondary | ICD-10-CM | POA: Diagnosis not present

## 2021-09-21 DIAGNOSIS — D500 Iron deficiency anemia secondary to blood loss (chronic): Secondary | ICD-10-CM | POA: Diagnosis not present

## 2021-09-21 DIAGNOSIS — I679 Cerebrovascular disease, unspecified: Secondary | ICD-10-CM | POA: Diagnosis not present

## 2021-09-23 DIAGNOSIS — N184 Chronic kidney disease, stage 4 (severe): Secondary | ICD-10-CM | POA: Diagnosis not present

## 2021-09-23 DIAGNOSIS — E785 Hyperlipidemia, unspecified: Secondary | ICD-10-CM | POA: Diagnosis not present

## 2021-09-23 DIAGNOSIS — F01511 Vascular dementia, unspecified severity, with agitation: Secondary | ICD-10-CM | POA: Diagnosis not present

## 2021-09-23 DIAGNOSIS — G2409 Other drug induced dystonia: Secondary | ICD-10-CM | POA: Diagnosis not present

## 2021-09-23 DIAGNOSIS — I679 Cerebrovascular disease, unspecified: Secondary | ICD-10-CM | POA: Diagnosis not present

## 2021-09-23 DIAGNOSIS — E118 Type 2 diabetes mellitus with unspecified complications: Secondary | ICD-10-CM | POA: Diagnosis not present

## 2021-09-23 DIAGNOSIS — D500 Iron deficiency anemia secondary to blood loss (chronic): Secondary | ICD-10-CM | POA: Diagnosis not present

## 2021-09-23 DIAGNOSIS — E43 Unspecified severe protein-calorie malnutrition: Secondary | ICD-10-CM | POA: Diagnosis not present

## 2021-09-23 DIAGNOSIS — F39 Unspecified mood [affective] disorder: Secondary | ICD-10-CM | POA: Diagnosis not present

## 2021-09-23 DIAGNOSIS — M1990 Unspecified osteoarthritis, unspecified site: Secondary | ICD-10-CM | POA: Diagnosis not present

## 2021-09-23 DIAGNOSIS — Z8616 Personal history of COVID-19: Secondary | ICD-10-CM | POA: Diagnosis not present

## 2021-09-24 DIAGNOSIS — E43 Unspecified severe protein-calorie malnutrition: Secondary | ICD-10-CM | POA: Diagnosis not present

## 2021-09-24 DIAGNOSIS — D500 Iron deficiency anemia secondary to blood loss (chronic): Secondary | ICD-10-CM | POA: Diagnosis not present

## 2021-09-24 DIAGNOSIS — N184 Chronic kidney disease, stage 4 (severe): Secondary | ICD-10-CM | POA: Diagnosis not present

## 2021-09-24 DIAGNOSIS — I679 Cerebrovascular disease, unspecified: Secondary | ICD-10-CM | POA: Diagnosis not present

## 2021-09-24 DIAGNOSIS — Z8616 Personal history of COVID-19: Secondary | ICD-10-CM | POA: Diagnosis not present

## 2021-09-24 DIAGNOSIS — F39 Unspecified mood [affective] disorder: Secondary | ICD-10-CM | POA: Diagnosis not present

## 2021-09-27 DIAGNOSIS — N184 Chronic kidney disease, stage 4 (severe): Secondary | ICD-10-CM | POA: Diagnosis not present

## 2021-09-27 DIAGNOSIS — E43 Unspecified severe protein-calorie malnutrition: Secondary | ICD-10-CM | POA: Diagnosis not present

## 2021-09-27 DIAGNOSIS — D500 Iron deficiency anemia secondary to blood loss (chronic): Secondary | ICD-10-CM | POA: Diagnosis not present

## 2021-09-27 DIAGNOSIS — Z8616 Personal history of COVID-19: Secondary | ICD-10-CM | POA: Diagnosis not present

## 2021-09-27 DIAGNOSIS — F39 Unspecified mood [affective] disorder: Secondary | ICD-10-CM | POA: Diagnosis not present

## 2021-09-27 DIAGNOSIS — I679 Cerebrovascular disease, unspecified: Secondary | ICD-10-CM | POA: Diagnosis not present

## 2021-09-28 DIAGNOSIS — F39 Unspecified mood [affective] disorder: Secondary | ICD-10-CM | POA: Diagnosis not present

## 2021-09-28 DIAGNOSIS — D500 Iron deficiency anemia secondary to blood loss (chronic): Secondary | ICD-10-CM | POA: Diagnosis not present

## 2021-09-28 DIAGNOSIS — I679 Cerebrovascular disease, unspecified: Secondary | ICD-10-CM | POA: Diagnosis not present

## 2021-09-28 DIAGNOSIS — N184 Chronic kidney disease, stage 4 (severe): Secondary | ICD-10-CM | POA: Diagnosis not present

## 2021-09-28 DIAGNOSIS — Z8616 Personal history of COVID-19: Secondary | ICD-10-CM | POA: Diagnosis not present

## 2021-09-28 DIAGNOSIS — E43 Unspecified severe protein-calorie malnutrition: Secondary | ICD-10-CM | POA: Diagnosis not present

## 2021-09-30 DIAGNOSIS — D500 Iron deficiency anemia secondary to blood loss (chronic): Secondary | ICD-10-CM | POA: Diagnosis not present

## 2021-09-30 DIAGNOSIS — F39 Unspecified mood [affective] disorder: Secondary | ICD-10-CM | POA: Diagnosis not present

## 2021-09-30 DIAGNOSIS — E43 Unspecified severe protein-calorie malnutrition: Secondary | ICD-10-CM | POA: Diagnosis not present

## 2021-09-30 DIAGNOSIS — N184 Chronic kidney disease, stage 4 (severe): Secondary | ICD-10-CM | POA: Diagnosis not present

## 2021-09-30 DIAGNOSIS — I679 Cerebrovascular disease, unspecified: Secondary | ICD-10-CM | POA: Diagnosis not present

## 2021-09-30 DIAGNOSIS — Z8616 Personal history of COVID-19: Secondary | ICD-10-CM | POA: Diagnosis not present

## 2021-10-04 DIAGNOSIS — I679 Cerebrovascular disease, unspecified: Secondary | ICD-10-CM | POA: Diagnosis not present

## 2021-10-04 DIAGNOSIS — D500 Iron deficiency anemia secondary to blood loss (chronic): Secondary | ICD-10-CM | POA: Diagnosis not present

## 2021-10-04 DIAGNOSIS — F39 Unspecified mood [affective] disorder: Secondary | ICD-10-CM | POA: Diagnosis not present

## 2021-10-04 DIAGNOSIS — E43 Unspecified severe protein-calorie malnutrition: Secondary | ICD-10-CM | POA: Diagnosis not present

## 2021-10-04 DIAGNOSIS — N184 Chronic kidney disease, stage 4 (severe): Secondary | ICD-10-CM | POA: Diagnosis not present

## 2021-10-04 DIAGNOSIS — Z8616 Personal history of COVID-19: Secondary | ICD-10-CM | POA: Diagnosis not present

## 2021-10-05 DIAGNOSIS — N184 Chronic kidney disease, stage 4 (severe): Secondary | ICD-10-CM | POA: Diagnosis not present

## 2021-10-05 DIAGNOSIS — F39 Unspecified mood [affective] disorder: Secondary | ICD-10-CM | POA: Diagnosis not present

## 2021-10-05 DIAGNOSIS — I679 Cerebrovascular disease, unspecified: Secondary | ICD-10-CM | POA: Diagnosis not present

## 2021-10-05 DIAGNOSIS — E43 Unspecified severe protein-calorie malnutrition: Secondary | ICD-10-CM | POA: Diagnosis not present

## 2021-10-05 DIAGNOSIS — D500 Iron deficiency anemia secondary to blood loss (chronic): Secondary | ICD-10-CM | POA: Diagnosis not present

## 2021-10-05 DIAGNOSIS — Z8616 Personal history of COVID-19: Secondary | ICD-10-CM | POA: Diagnosis not present

## 2021-10-07 DIAGNOSIS — D500 Iron deficiency anemia secondary to blood loss (chronic): Secondary | ICD-10-CM | POA: Diagnosis not present

## 2021-10-07 DIAGNOSIS — E43 Unspecified severe protein-calorie malnutrition: Secondary | ICD-10-CM | POA: Diagnosis not present

## 2021-10-07 DIAGNOSIS — F39 Unspecified mood [affective] disorder: Secondary | ICD-10-CM | POA: Diagnosis not present

## 2021-10-07 DIAGNOSIS — N184 Chronic kidney disease, stage 4 (severe): Secondary | ICD-10-CM | POA: Diagnosis not present

## 2021-10-07 DIAGNOSIS — Z8616 Personal history of COVID-19: Secondary | ICD-10-CM | POA: Diagnosis not present

## 2021-10-07 DIAGNOSIS — I679 Cerebrovascular disease, unspecified: Secondary | ICD-10-CM | POA: Diagnosis not present

## 2021-10-08 DIAGNOSIS — F39 Unspecified mood [affective] disorder: Secondary | ICD-10-CM | POA: Diagnosis not present

## 2021-10-08 DIAGNOSIS — E43 Unspecified severe protein-calorie malnutrition: Secondary | ICD-10-CM | POA: Diagnosis not present

## 2021-10-08 DIAGNOSIS — N184 Chronic kidney disease, stage 4 (severe): Secondary | ICD-10-CM | POA: Diagnosis not present

## 2021-10-08 DIAGNOSIS — I679 Cerebrovascular disease, unspecified: Secondary | ICD-10-CM | POA: Diagnosis not present

## 2021-10-08 DIAGNOSIS — D500 Iron deficiency anemia secondary to blood loss (chronic): Secondary | ICD-10-CM | POA: Diagnosis not present

## 2021-10-08 DIAGNOSIS — Z8616 Personal history of COVID-19: Secondary | ICD-10-CM | POA: Diagnosis not present

## 2021-10-12 DIAGNOSIS — E43 Unspecified severe protein-calorie malnutrition: Secondary | ICD-10-CM | POA: Diagnosis not present

## 2021-10-12 DIAGNOSIS — Z8616 Personal history of COVID-19: Secondary | ICD-10-CM | POA: Diagnosis not present

## 2021-10-12 DIAGNOSIS — F39 Unspecified mood [affective] disorder: Secondary | ICD-10-CM | POA: Diagnosis not present

## 2021-10-12 DIAGNOSIS — D500 Iron deficiency anemia secondary to blood loss (chronic): Secondary | ICD-10-CM | POA: Diagnosis not present

## 2021-10-12 DIAGNOSIS — N184 Chronic kidney disease, stage 4 (severe): Secondary | ICD-10-CM | POA: Diagnosis not present

## 2021-10-12 DIAGNOSIS — I679 Cerebrovascular disease, unspecified: Secondary | ICD-10-CM | POA: Diagnosis not present

## 2021-10-13 DIAGNOSIS — F39 Unspecified mood [affective] disorder: Secondary | ICD-10-CM | POA: Diagnosis not present

## 2021-10-13 DIAGNOSIS — D500 Iron deficiency anemia secondary to blood loss (chronic): Secondary | ICD-10-CM | POA: Diagnosis not present

## 2021-10-13 DIAGNOSIS — Z8616 Personal history of COVID-19: Secondary | ICD-10-CM | POA: Diagnosis not present

## 2021-10-13 DIAGNOSIS — N184 Chronic kidney disease, stage 4 (severe): Secondary | ICD-10-CM | POA: Diagnosis not present

## 2021-10-13 DIAGNOSIS — E43 Unspecified severe protein-calorie malnutrition: Secondary | ICD-10-CM | POA: Diagnosis not present

## 2021-10-13 DIAGNOSIS — I679 Cerebrovascular disease, unspecified: Secondary | ICD-10-CM | POA: Diagnosis not present

## 2021-10-15 DIAGNOSIS — E43 Unspecified severe protein-calorie malnutrition: Secondary | ICD-10-CM | POA: Diagnosis not present

## 2021-10-15 DIAGNOSIS — N184 Chronic kidney disease, stage 4 (severe): Secondary | ICD-10-CM | POA: Diagnosis not present

## 2021-10-15 DIAGNOSIS — Z8616 Personal history of COVID-19: Secondary | ICD-10-CM | POA: Diagnosis not present

## 2021-10-15 DIAGNOSIS — D500 Iron deficiency anemia secondary to blood loss (chronic): Secondary | ICD-10-CM | POA: Diagnosis not present

## 2021-10-15 DIAGNOSIS — F39 Unspecified mood [affective] disorder: Secondary | ICD-10-CM | POA: Diagnosis not present

## 2021-10-15 DIAGNOSIS — I679 Cerebrovascular disease, unspecified: Secondary | ICD-10-CM | POA: Diagnosis not present

## 2021-10-19 DIAGNOSIS — I679 Cerebrovascular disease, unspecified: Secondary | ICD-10-CM | POA: Diagnosis not present

## 2021-10-19 DIAGNOSIS — N184 Chronic kidney disease, stage 4 (severe): Secondary | ICD-10-CM | POA: Diagnosis not present

## 2021-10-19 DIAGNOSIS — F39 Unspecified mood [affective] disorder: Secondary | ICD-10-CM | POA: Diagnosis not present

## 2021-10-19 DIAGNOSIS — Z8616 Personal history of COVID-19: Secondary | ICD-10-CM | POA: Diagnosis not present

## 2021-10-19 DIAGNOSIS — E43 Unspecified severe protein-calorie malnutrition: Secondary | ICD-10-CM | POA: Diagnosis not present

## 2021-10-19 DIAGNOSIS — D500 Iron deficiency anemia secondary to blood loss (chronic): Secondary | ICD-10-CM | POA: Diagnosis not present

## 2021-10-20 DIAGNOSIS — F39 Unspecified mood [affective] disorder: Secondary | ICD-10-CM | POA: Diagnosis not present

## 2021-10-20 DIAGNOSIS — I679 Cerebrovascular disease, unspecified: Secondary | ICD-10-CM | POA: Diagnosis not present

## 2021-10-20 DIAGNOSIS — D500 Iron deficiency anemia secondary to blood loss (chronic): Secondary | ICD-10-CM | POA: Diagnosis not present

## 2021-10-20 DIAGNOSIS — Z8616 Personal history of COVID-19: Secondary | ICD-10-CM | POA: Diagnosis not present

## 2021-10-20 DIAGNOSIS — E43 Unspecified severe protein-calorie malnutrition: Secondary | ICD-10-CM | POA: Diagnosis not present

## 2021-10-20 DIAGNOSIS — N184 Chronic kidney disease, stage 4 (severe): Secondary | ICD-10-CM | POA: Diagnosis not present

## 2021-10-21 DIAGNOSIS — F39 Unspecified mood [affective] disorder: Secondary | ICD-10-CM | POA: Diagnosis not present

## 2021-10-21 DIAGNOSIS — Z8616 Personal history of COVID-19: Secondary | ICD-10-CM | POA: Diagnosis not present

## 2021-10-21 DIAGNOSIS — E43 Unspecified severe protein-calorie malnutrition: Secondary | ICD-10-CM | POA: Diagnosis not present

## 2021-10-21 DIAGNOSIS — D500 Iron deficiency anemia secondary to blood loss (chronic): Secondary | ICD-10-CM | POA: Diagnosis not present

## 2021-10-21 DIAGNOSIS — N184 Chronic kidney disease, stage 4 (severe): Secondary | ICD-10-CM | POA: Diagnosis not present

## 2021-10-21 DIAGNOSIS — I679 Cerebrovascular disease, unspecified: Secondary | ICD-10-CM | POA: Diagnosis not present

## 2021-10-23 DIAGNOSIS — G2409 Other drug induced dystonia: Secondary | ICD-10-CM | POA: Diagnosis not present

## 2021-10-23 DIAGNOSIS — E785 Hyperlipidemia, unspecified: Secondary | ICD-10-CM | POA: Diagnosis not present

## 2021-10-23 DIAGNOSIS — I679 Cerebrovascular disease, unspecified: Secondary | ICD-10-CM | POA: Diagnosis not present

## 2021-10-23 DIAGNOSIS — M1990 Unspecified osteoarthritis, unspecified site: Secondary | ICD-10-CM | POA: Diagnosis not present

## 2021-10-23 DIAGNOSIS — F39 Unspecified mood [affective] disorder: Secondary | ICD-10-CM | POA: Diagnosis not present

## 2021-10-23 DIAGNOSIS — E118 Type 2 diabetes mellitus with unspecified complications: Secondary | ICD-10-CM | POA: Diagnosis not present

## 2021-10-23 DIAGNOSIS — E43 Unspecified severe protein-calorie malnutrition: Secondary | ICD-10-CM | POA: Diagnosis not present

## 2021-10-23 DIAGNOSIS — D500 Iron deficiency anemia secondary to blood loss (chronic): Secondary | ICD-10-CM | POA: Diagnosis not present

## 2021-10-23 DIAGNOSIS — Z8616 Personal history of COVID-19: Secondary | ICD-10-CM | POA: Diagnosis not present

## 2021-10-23 DIAGNOSIS — F01511 Vascular dementia, unspecified severity, with agitation: Secondary | ICD-10-CM | POA: Diagnosis not present

## 2021-10-23 DIAGNOSIS — N184 Chronic kidney disease, stage 4 (severe): Secondary | ICD-10-CM | POA: Diagnosis not present

## 2021-10-25 DIAGNOSIS — F39 Unspecified mood [affective] disorder: Secondary | ICD-10-CM | POA: Diagnosis not present

## 2021-10-25 DIAGNOSIS — I679 Cerebrovascular disease, unspecified: Secondary | ICD-10-CM | POA: Diagnosis not present

## 2021-10-25 DIAGNOSIS — D500 Iron deficiency anemia secondary to blood loss (chronic): Secondary | ICD-10-CM | POA: Diagnosis not present

## 2021-10-25 DIAGNOSIS — Z8616 Personal history of COVID-19: Secondary | ICD-10-CM | POA: Diagnosis not present

## 2021-10-25 DIAGNOSIS — E43 Unspecified severe protein-calorie malnutrition: Secondary | ICD-10-CM | POA: Diagnosis not present

## 2021-10-25 DIAGNOSIS — N184 Chronic kidney disease, stage 4 (severe): Secondary | ICD-10-CM | POA: Diagnosis not present

## 2021-10-27 DIAGNOSIS — I679 Cerebrovascular disease, unspecified: Secondary | ICD-10-CM | POA: Diagnosis not present

## 2021-10-27 DIAGNOSIS — E43 Unspecified severe protein-calorie malnutrition: Secondary | ICD-10-CM | POA: Diagnosis not present

## 2021-10-27 DIAGNOSIS — F39 Unspecified mood [affective] disorder: Secondary | ICD-10-CM | POA: Diagnosis not present

## 2021-10-27 DIAGNOSIS — Z8616 Personal history of COVID-19: Secondary | ICD-10-CM | POA: Diagnosis not present

## 2021-10-27 DIAGNOSIS — N184 Chronic kidney disease, stage 4 (severe): Secondary | ICD-10-CM | POA: Diagnosis not present

## 2021-10-27 DIAGNOSIS — D500 Iron deficiency anemia secondary to blood loss (chronic): Secondary | ICD-10-CM | POA: Diagnosis not present

## 2021-10-28 DIAGNOSIS — F39 Unspecified mood [affective] disorder: Secondary | ICD-10-CM | POA: Diagnosis not present

## 2021-10-28 DIAGNOSIS — E43 Unspecified severe protein-calorie malnutrition: Secondary | ICD-10-CM | POA: Diagnosis not present

## 2021-10-28 DIAGNOSIS — Z8616 Personal history of COVID-19: Secondary | ICD-10-CM | POA: Diagnosis not present

## 2021-10-28 DIAGNOSIS — I679 Cerebrovascular disease, unspecified: Secondary | ICD-10-CM | POA: Diagnosis not present

## 2021-10-28 DIAGNOSIS — D500 Iron deficiency anemia secondary to blood loss (chronic): Secondary | ICD-10-CM | POA: Diagnosis not present

## 2021-10-28 DIAGNOSIS — N184 Chronic kidney disease, stage 4 (severe): Secondary | ICD-10-CM | POA: Diagnosis not present

## 2021-11-01 DIAGNOSIS — I679 Cerebrovascular disease, unspecified: Secondary | ICD-10-CM | POA: Diagnosis not present

## 2021-11-01 DIAGNOSIS — Z8616 Personal history of COVID-19: Secondary | ICD-10-CM | POA: Diagnosis not present

## 2021-11-01 DIAGNOSIS — F39 Unspecified mood [affective] disorder: Secondary | ICD-10-CM | POA: Diagnosis not present

## 2021-11-01 DIAGNOSIS — E43 Unspecified severe protein-calorie malnutrition: Secondary | ICD-10-CM | POA: Diagnosis not present

## 2021-11-01 DIAGNOSIS — D500 Iron deficiency anemia secondary to blood loss (chronic): Secondary | ICD-10-CM | POA: Diagnosis not present

## 2021-11-01 DIAGNOSIS — N184 Chronic kidney disease, stage 4 (severe): Secondary | ICD-10-CM | POA: Diagnosis not present

## 2021-11-03 DIAGNOSIS — Z8616 Personal history of COVID-19: Secondary | ICD-10-CM | POA: Diagnosis not present

## 2021-11-03 DIAGNOSIS — N184 Chronic kidney disease, stage 4 (severe): Secondary | ICD-10-CM | POA: Diagnosis not present

## 2021-11-03 DIAGNOSIS — I679 Cerebrovascular disease, unspecified: Secondary | ICD-10-CM | POA: Diagnosis not present

## 2021-11-03 DIAGNOSIS — E43 Unspecified severe protein-calorie malnutrition: Secondary | ICD-10-CM | POA: Diagnosis not present

## 2021-11-03 DIAGNOSIS — F39 Unspecified mood [affective] disorder: Secondary | ICD-10-CM | POA: Diagnosis not present

## 2021-11-03 DIAGNOSIS — D500 Iron deficiency anemia secondary to blood loss (chronic): Secondary | ICD-10-CM | POA: Diagnosis not present

## 2021-11-05 DIAGNOSIS — E43 Unspecified severe protein-calorie malnutrition: Secondary | ICD-10-CM | POA: Diagnosis not present

## 2021-11-05 DIAGNOSIS — I679 Cerebrovascular disease, unspecified: Secondary | ICD-10-CM | POA: Diagnosis not present

## 2021-11-05 DIAGNOSIS — Z8616 Personal history of COVID-19: Secondary | ICD-10-CM | POA: Diagnosis not present

## 2021-11-05 DIAGNOSIS — D500 Iron deficiency anemia secondary to blood loss (chronic): Secondary | ICD-10-CM | POA: Diagnosis not present

## 2021-11-05 DIAGNOSIS — N184 Chronic kidney disease, stage 4 (severe): Secondary | ICD-10-CM | POA: Diagnosis not present

## 2021-11-05 DIAGNOSIS — F39 Unspecified mood [affective] disorder: Secondary | ICD-10-CM | POA: Diagnosis not present

## 2021-11-08 DIAGNOSIS — Z8616 Personal history of COVID-19: Secondary | ICD-10-CM | POA: Diagnosis not present

## 2021-11-08 DIAGNOSIS — E43 Unspecified severe protein-calorie malnutrition: Secondary | ICD-10-CM | POA: Diagnosis not present

## 2021-11-08 DIAGNOSIS — F39 Unspecified mood [affective] disorder: Secondary | ICD-10-CM | POA: Diagnosis not present

## 2021-11-08 DIAGNOSIS — D500 Iron deficiency anemia secondary to blood loss (chronic): Secondary | ICD-10-CM | POA: Diagnosis not present

## 2021-11-08 DIAGNOSIS — N184 Chronic kidney disease, stage 4 (severe): Secondary | ICD-10-CM | POA: Diagnosis not present

## 2021-11-08 DIAGNOSIS — I679 Cerebrovascular disease, unspecified: Secondary | ICD-10-CM | POA: Diagnosis not present

## 2021-11-10 DIAGNOSIS — D500 Iron deficiency anemia secondary to blood loss (chronic): Secondary | ICD-10-CM | POA: Diagnosis not present

## 2021-11-10 DIAGNOSIS — E43 Unspecified severe protein-calorie malnutrition: Secondary | ICD-10-CM | POA: Diagnosis not present

## 2021-11-10 DIAGNOSIS — I679 Cerebrovascular disease, unspecified: Secondary | ICD-10-CM | POA: Diagnosis not present

## 2021-11-10 DIAGNOSIS — Z8616 Personal history of COVID-19: Secondary | ICD-10-CM | POA: Diagnosis not present

## 2021-11-10 DIAGNOSIS — F39 Unspecified mood [affective] disorder: Secondary | ICD-10-CM | POA: Diagnosis not present

## 2021-11-10 DIAGNOSIS — N184 Chronic kidney disease, stage 4 (severe): Secondary | ICD-10-CM | POA: Diagnosis not present

## 2021-11-11 DIAGNOSIS — I679 Cerebrovascular disease, unspecified: Secondary | ICD-10-CM | POA: Diagnosis not present

## 2021-11-11 DIAGNOSIS — Z8616 Personal history of COVID-19: Secondary | ICD-10-CM | POA: Diagnosis not present

## 2021-11-11 DIAGNOSIS — E43 Unspecified severe protein-calorie malnutrition: Secondary | ICD-10-CM | POA: Diagnosis not present

## 2021-11-11 DIAGNOSIS — D500 Iron deficiency anemia secondary to blood loss (chronic): Secondary | ICD-10-CM | POA: Diagnosis not present

## 2021-11-11 DIAGNOSIS — N184 Chronic kidney disease, stage 4 (severe): Secondary | ICD-10-CM | POA: Diagnosis not present

## 2021-11-11 DIAGNOSIS — F39 Unspecified mood [affective] disorder: Secondary | ICD-10-CM | POA: Diagnosis not present

## 2021-11-12 DIAGNOSIS — I679 Cerebrovascular disease, unspecified: Secondary | ICD-10-CM | POA: Diagnosis not present

## 2021-11-12 DIAGNOSIS — Z8616 Personal history of COVID-19: Secondary | ICD-10-CM | POA: Diagnosis not present

## 2021-11-12 DIAGNOSIS — N184 Chronic kidney disease, stage 4 (severe): Secondary | ICD-10-CM | POA: Diagnosis not present

## 2021-11-12 DIAGNOSIS — F39 Unspecified mood [affective] disorder: Secondary | ICD-10-CM | POA: Diagnosis not present

## 2021-11-12 DIAGNOSIS — D500 Iron deficiency anemia secondary to blood loss (chronic): Secondary | ICD-10-CM | POA: Diagnosis not present

## 2021-11-12 DIAGNOSIS — E43 Unspecified severe protein-calorie malnutrition: Secondary | ICD-10-CM | POA: Diagnosis not present

## 2021-11-15 DIAGNOSIS — Z8616 Personal history of COVID-19: Secondary | ICD-10-CM | POA: Diagnosis not present

## 2021-11-15 DIAGNOSIS — E43 Unspecified severe protein-calorie malnutrition: Secondary | ICD-10-CM | POA: Diagnosis not present

## 2021-11-15 DIAGNOSIS — D500 Iron deficiency anemia secondary to blood loss (chronic): Secondary | ICD-10-CM | POA: Diagnosis not present

## 2021-11-15 DIAGNOSIS — F39 Unspecified mood [affective] disorder: Secondary | ICD-10-CM | POA: Diagnosis not present

## 2021-11-15 DIAGNOSIS — I679 Cerebrovascular disease, unspecified: Secondary | ICD-10-CM | POA: Diagnosis not present

## 2021-11-15 DIAGNOSIS — N184 Chronic kidney disease, stage 4 (severe): Secondary | ICD-10-CM | POA: Diagnosis not present

## 2021-11-17 DIAGNOSIS — F39 Unspecified mood [affective] disorder: Secondary | ICD-10-CM | POA: Diagnosis not present

## 2021-11-17 DIAGNOSIS — E43 Unspecified severe protein-calorie malnutrition: Secondary | ICD-10-CM | POA: Diagnosis not present

## 2021-11-17 DIAGNOSIS — I679 Cerebrovascular disease, unspecified: Secondary | ICD-10-CM | POA: Diagnosis not present

## 2021-11-17 DIAGNOSIS — Z8616 Personal history of COVID-19: Secondary | ICD-10-CM | POA: Diagnosis not present

## 2021-11-17 DIAGNOSIS — N184 Chronic kidney disease, stage 4 (severe): Secondary | ICD-10-CM | POA: Diagnosis not present

## 2021-11-17 DIAGNOSIS — D500 Iron deficiency anemia secondary to blood loss (chronic): Secondary | ICD-10-CM | POA: Diagnosis not present

## 2021-11-22 DIAGNOSIS — D649 Anemia, unspecified: Secondary | ICD-10-CM | POA: Diagnosis not present

## 2021-11-22 DIAGNOSIS — I679 Cerebrovascular disease, unspecified: Secondary | ICD-10-CM | POA: Diagnosis not present

## 2021-11-22 DIAGNOSIS — H409 Unspecified glaucoma: Secondary | ICD-10-CM | POA: Diagnosis not present

## 2021-11-22 DIAGNOSIS — M1990 Unspecified osteoarthritis, unspecified site: Secondary | ICD-10-CM | POA: Diagnosis not present

## 2021-11-22 DIAGNOSIS — N184 Chronic kidney disease, stage 4 (severe): Secondary | ICD-10-CM | POA: Diagnosis not present

## 2021-11-22 DIAGNOSIS — I150 Renovascular hypertension: Secondary | ICD-10-CM | POA: Diagnosis not present

## 2021-11-22 DIAGNOSIS — F809 Developmental disorder of speech and language, unspecified: Secondary | ICD-10-CM | POA: Diagnosis not present

## 2021-11-22 DIAGNOSIS — Z515 Encounter for palliative care: Secondary | ICD-10-CM | POA: Diagnosis not present

## 2021-11-22 DIAGNOSIS — E118 Type 2 diabetes mellitus with unspecified complications: Secondary | ICD-10-CM | POA: Diagnosis not present

## 2021-11-22 DIAGNOSIS — F39 Unspecified mood [affective] disorder: Secondary | ICD-10-CM | POA: Diagnosis not present

## 2021-11-23 DIAGNOSIS — E118 Type 2 diabetes mellitus with unspecified complications: Secondary | ICD-10-CM | POA: Diagnosis not present

## 2021-11-23 DIAGNOSIS — E785 Hyperlipidemia, unspecified: Secondary | ICD-10-CM | POA: Diagnosis not present

## 2021-11-23 DIAGNOSIS — F01511 Vascular dementia, unspecified severity, with agitation: Secondary | ICD-10-CM | POA: Diagnosis not present

## 2021-11-23 DIAGNOSIS — N184 Chronic kidney disease, stage 4 (severe): Secondary | ICD-10-CM | POA: Diagnosis not present

## 2021-11-23 DIAGNOSIS — Z8616 Personal history of COVID-19: Secondary | ICD-10-CM | POA: Diagnosis not present

## 2021-11-23 DIAGNOSIS — D500 Iron deficiency anemia secondary to blood loss (chronic): Secondary | ICD-10-CM | POA: Diagnosis not present

## 2021-11-23 DIAGNOSIS — I679 Cerebrovascular disease, unspecified: Secondary | ICD-10-CM | POA: Diagnosis not present

## 2021-11-23 DIAGNOSIS — E43 Unspecified severe protein-calorie malnutrition: Secondary | ICD-10-CM | POA: Diagnosis not present

## 2021-11-23 DIAGNOSIS — M1990 Unspecified osteoarthritis, unspecified site: Secondary | ICD-10-CM | POA: Diagnosis not present

## 2021-11-23 DIAGNOSIS — F39 Unspecified mood [affective] disorder: Secondary | ICD-10-CM | POA: Diagnosis not present

## 2021-11-23 DIAGNOSIS — G2409 Other drug induced dystonia: Secondary | ICD-10-CM | POA: Diagnosis not present

## 2021-11-24 DIAGNOSIS — Z8616 Personal history of COVID-19: Secondary | ICD-10-CM | POA: Diagnosis not present

## 2021-11-24 DIAGNOSIS — D500 Iron deficiency anemia secondary to blood loss (chronic): Secondary | ICD-10-CM | POA: Diagnosis not present

## 2021-11-24 DIAGNOSIS — I679 Cerebrovascular disease, unspecified: Secondary | ICD-10-CM | POA: Diagnosis not present

## 2021-11-24 DIAGNOSIS — E43 Unspecified severe protein-calorie malnutrition: Secondary | ICD-10-CM | POA: Diagnosis not present

## 2021-11-24 DIAGNOSIS — F39 Unspecified mood [affective] disorder: Secondary | ICD-10-CM | POA: Diagnosis not present

## 2021-11-24 DIAGNOSIS — N184 Chronic kidney disease, stage 4 (severe): Secondary | ICD-10-CM | POA: Diagnosis not present

## 2021-11-25 DIAGNOSIS — D500 Iron deficiency anemia secondary to blood loss (chronic): Secondary | ICD-10-CM | POA: Diagnosis not present

## 2021-11-25 DIAGNOSIS — I679 Cerebrovascular disease, unspecified: Secondary | ICD-10-CM | POA: Diagnosis not present

## 2021-11-25 DIAGNOSIS — E43 Unspecified severe protein-calorie malnutrition: Secondary | ICD-10-CM | POA: Diagnosis not present

## 2021-11-25 DIAGNOSIS — F39 Unspecified mood [affective] disorder: Secondary | ICD-10-CM | POA: Diagnosis not present

## 2021-11-25 DIAGNOSIS — Z8616 Personal history of COVID-19: Secondary | ICD-10-CM | POA: Diagnosis not present

## 2021-11-25 DIAGNOSIS — N184 Chronic kidney disease, stage 4 (severe): Secondary | ICD-10-CM | POA: Diagnosis not present

## 2021-11-26 DIAGNOSIS — Z8616 Personal history of COVID-19: Secondary | ICD-10-CM | POA: Diagnosis not present

## 2021-11-26 DIAGNOSIS — D500 Iron deficiency anemia secondary to blood loss (chronic): Secondary | ICD-10-CM | POA: Diagnosis not present

## 2021-11-26 DIAGNOSIS — F39 Unspecified mood [affective] disorder: Secondary | ICD-10-CM | POA: Diagnosis not present

## 2021-11-26 DIAGNOSIS — I679 Cerebrovascular disease, unspecified: Secondary | ICD-10-CM | POA: Diagnosis not present

## 2021-11-26 DIAGNOSIS — E43 Unspecified severe protein-calorie malnutrition: Secondary | ICD-10-CM | POA: Diagnosis not present

## 2021-11-26 DIAGNOSIS — N184 Chronic kidney disease, stage 4 (severe): Secondary | ICD-10-CM | POA: Diagnosis not present

## 2021-11-29 DIAGNOSIS — E43 Unspecified severe protein-calorie malnutrition: Secondary | ICD-10-CM | POA: Diagnosis not present

## 2021-11-29 DIAGNOSIS — F39 Unspecified mood [affective] disorder: Secondary | ICD-10-CM | POA: Diagnosis not present

## 2021-11-29 DIAGNOSIS — Z8616 Personal history of COVID-19: Secondary | ICD-10-CM | POA: Diagnosis not present

## 2021-11-29 DIAGNOSIS — N184 Chronic kidney disease, stage 4 (severe): Secondary | ICD-10-CM | POA: Diagnosis not present

## 2021-11-29 DIAGNOSIS — I679 Cerebrovascular disease, unspecified: Secondary | ICD-10-CM | POA: Diagnosis not present

## 2021-11-29 DIAGNOSIS — D500 Iron deficiency anemia secondary to blood loss (chronic): Secondary | ICD-10-CM | POA: Diagnosis not present

## 2021-11-30 DIAGNOSIS — F39 Unspecified mood [affective] disorder: Secondary | ICD-10-CM | POA: Diagnosis not present

## 2021-11-30 DIAGNOSIS — E43 Unspecified severe protein-calorie malnutrition: Secondary | ICD-10-CM | POA: Diagnosis not present

## 2021-11-30 DIAGNOSIS — N184 Chronic kidney disease, stage 4 (severe): Secondary | ICD-10-CM | POA: Diagnosis not present

## 2021-11-30 DIAGNOSIS — Z8616 Personal history of COVID-19: Secondary | ICD-10-CM | POA: Diagnosis not present

## 2021-11-30 DIAGNOSIS — D500 Iron deficiency anemia secondary to blood loss (chronic): Secondary | ICD-10-CM | POA: Diagnosis not present

## 2021-11-30 DIAGNOSIS — I679 Cerebrovascular disease, unspecified: Secondary | ICD-10-CM | POA: Diagnosis not present

## 2021-12-01 DIAGNOSIS — I679 Cerebrovascular disease, unspecified: Secondary | ICD-10-CM | POA: Diagnosis not present

## 2021-12-01 DIAGNOSIS — D500 Iron deficiency anemia secondary to blood loss (chronic): Secondary | ICD-10-CM | POA: Diagnosis not present

## 2021-12-01 DIAGNOSIS — E43 Unspecified severe protein-calorie malnutrition: Secondary | ICD-10-CM | POA: Diagnosis not present

## 2021-12-01 DIAGNOSIS — N184 Chronic kidney disease, stage 4 (severe): Secondary | ICD-10-CM | POA: Diagnosis not present

## 2021-12-01 DIAGNOSIS — F39 Unspecified mood [affective] disorder: Secondary | ICD-10-CM | POA: Diagnosis not present

## 2021-12-01 DIAGNOSIS — Z8616 Personal history of COVID-19: Secondary | ICD-10-CM | POA: Diagnosis not present

## 2021-12-03 DIAGNOSIS — E43 Unspecified severe protein-calorie malnutrition: Secondary | ICD-10-CM | POA: Diagnosis not present

## 2021-12-03 DIAGNOSIS — I679 Cerebrovascular disease, unspecified: Secondary | ICD-10-CM | POA: Diagnosis not present

## 2021-12-03 DIAGNOSIS — F39 Unspecified mood [affective] disorder: Secondary | ICD-10-CM | POA: Diagnosis not present

## 2021-12-03 DIAGNOSIS — Z8616 Personal history of COVID-19: Secondary | ICD-10-CM | POA: Diagnosis not present

## 2021-12-03 DIAGNOSIS — D500 Iron deficiency anemia secondary to blood loss (chronic): Secondary | ICD-10-CM | POA: Diagnosis not present

## 2021-12-03 DIAGNOSIS — N184 Chronic kidney disease, stage 4 (severe): Secondary | ICD-10-CM | POA: Diagnosis not present

## 2021-12-06 DIAGNOSIS — E43 Unspecified severe protein-calorie malnutrition: Secondary | ICD-10-CM | POA: Diagnosis not present

## 2021-12-06 DIAGNOSIS — Z8616 Personal history of COVID-19: Secondary | ICD-10-CM | POA: Diagnosis not present

## 2021-12-06 DIAGNOSIS — F39 Unspecified mood [affective] disorder: Secondary | ICD-10-CM | POA: Diagnosis not present

## 2021-12-06 DIAGNOSIS — N184 Chronic kidney disease, stage 4 (severe): Secondary | ICD-10-CM | POA: Diagnosis not present

## 2021-12-06 DIAGNOSIS — D500 Iron deficiency anemia secondary to blood loss (chronic): Secondary | ICD-10-CM | POA: Diagnosis not present

## 2021-12-06 DIAGNOSIS — I679 Cerebrovascular disease, unspecified: Secondary | ICD-10-CM | POA: Diagnosis not present

## 2021-12-08 DIAGNOSIS — F39 Unspecified mood [affective] disorder: Secondary | ICD-10-CM | POA: Diagnosis not present

## 2021-12-08 DIAGNOSIS — E43 Unspecified severe protein-calorie malnutrition: Secondary | ICD-10-CM | POA: Diagnosis not present

## 2021-12-08 DIAGNOSIS — I679 Cerebrovascular disease, unspecified: Secondary | ICD-10-CM | POA: Diagnosis not present

## 2021-12-08 DIAGNOSIS — D500 Iron deficiency anemia secondary to blood loss (chronic): Secondary | ICD-10-CM | POA: Diagnosis not present

## 2021-12-08 DIAGNOSIS — Z8616 Personal history of COVID-19: Secondary | ICD-10-CM | POA: Diagnosis not present

## 2021-12-08 DIAGNOSIS — N184 Chronic kidney disease, stage 4 (severe): Secondary | ICD-10-CM | POA: Diagnosis not present

## 2021-12-13 DIAGNOSIS — D500 Iron deficiency anemia secondary to blood loss (chronic): Secondary | ICD-10-CM | POA: Diagnosis not present

## 2021-12-13 DIAGNOSIS — E43 Unspecified severe protein-calorie malnutrition: Secondary | ICD-10-CM | POA: Diagnosis not present

## 2021-12-13 DIAGNOSIS — N184 Chronic kidney disease, stage 4 (severe): Secondary | ICD-10-CM | POA: Diagnosis not present

## 2021-12-13 DIAGNOSIS — Z8616 Personal history of COVID-19: Secondary | ICD-10-CM | POA: Diagnosis not present

## 2021-12-13 DIAGNOSIS — F39 Unspecified mood [affective] disorder: Secondary | ICD-10-CM | POA: Diagnosis not present

## 2021-12-13 DIAGNOSIS — I679 Cerebrovascular disease, unspecified: Secondary | ICD-10-CM | POA: Diagnosis not present

## 2021-12-15 DIAGNOSIS — Z8616 Personal history of COVID-19: Secondary | ICD-10-CM | POA: Diagnosis not present

## 2021-12-15 DIAGNOSIS — F39 Unspecified mood [affective] disorder: Secondary | ICD-10-CM | POA: Diagnosis not present

## 2021-12-15 DIAGNOSIS — N184 Chronic kidney disease, stage 4 (severe): Secondary | ICD-10-CM | POA: Diagnosis not present

## 2021-12-15 DIAGNOSIS — E43 Unspecified severe protein-calorie malnutrition: Secondary | ICD-10-CM | POA: Diagnosis not present

## 2021-12-15 DIAGNOSIS — D500 Iron deficiency anemia secondary to blood loss (chronic): Secondary | ICD-10-CM | POA: Diagnosis not present

## 2021-12-15 DIAGNOSIS — I679 Cerebrovascular disease, unspecified: Secondary | ICD-10-CM | POA: Diagnosis not present

## 2021-12-16 DIAGNOSIS — D500 Iron deficiency anemia secondary to blood loss (chronic): Secondary | ICD-10-CM | POA: Diagnosis not present

## 2021-12-16 DIAGNOSIS — N184 Chronic kidney disease, stage 4 (severe): Secondary | ICD-10-CM | POA: Diagnosis not present

## 2021-12-16 DIAGNOSIS — Z8616 Personal history of COVID-19: Secondary | ICD-10-CM | POA: Diagnosis not present

## 2021-12-16 DIAGNOSIS — F39 Unspecified mood [affective] disorder: Secondary | ICD-10-CM | POA: Diagnosis not present

## 2021-12-16 DIAGNOSIS — E43 Unspecified severe protein-calorie malnutrition: Secondary | ICD-10-CM | POA: Diagnosis not present

## 2021-12-16 DIAGNOSIS — I679 Cerebrovascular disease, unspecified: Secondary | ICD-10-CM | POA: Diagnosis not present

## 2021-12-17 DIAGNOSIS — N184 Chronic kidney disease, stage 4 (severe): Secondary | ICD-10-CM | POA: Diagnosis not present

## 2021-12-17 DIAGNOSIS — E43 Unspecified severe protein-calorie malnutrition: Secondary | ICD-10-CM | POA: Diagnosis not present

## 2021-12-17 DIAGNOSIS — D500 Iron deficiency anemia secondary to blood loss (chronic): Secondary | ICD-10-CM | POA: Diagnosis not present

## 2021-12-17 DIAGNOSIS — Z8616 Personal history of COVID-19: Secondary | ICD-10-CM | POA: Diagnosis not present

## 2021-12-17 DIAGNOSIS — I679 Cerebrovascular disease, unspecified: Secondary | ICD-10-CM | POA: Diagnosis not present

## 2021-12-17 DIAGNOSIS — F39 Unspecified mood [affective] disorder: Secondary | ICD-10-CM | POA: Diagnosis not present

## 2021-12-20 DIAGNOSIS — I679 Cerebrovascular disease, unspecified: Secondary | ICD-10-CM | POA: Diagnosis not present

## 2021-12-20 DIAGNOSIS — N184 Chronic kidney disease, stage 4 (severe): Secondary | ICD-10-CM | POA: Diagnosis not present

## 2021-12-20 DIAGNOSIS — E43 Unspecified severe protein-calorie malnutrition: Secondary | ICD-10-CM | POA: Diagnosis not present

## 2021-12-20 DIAGNOSIS — D500 Iron deficiency anemia secondary to blood loss (chronic): Secondary | ICD-10-CM | POA: Diagnosis not present

## 2021-12-20 DIAGNOSIS — Z8616 Personal history of COVID-19: Secondary | ICD-10-CM | POA: Diagnosis not present

## 2021-12-20 DIAGNOSIS — F39 Unspecified mood [affective] disorder: Secondary | ICD-10-CM | POA: Diagnosis not present

## 2021-12-21 DIAGNOSIS — F39 Unspecified mood [affective] disorder: Secondary | ICD-10-CM | POA: Diagnosis not present

## 2021-12-21 DIAGNOSIS — D500 Iron deficiency anemia secondary to blood loss (chronic): Secondary | ICD-10-CM | POA: Diagnosis not present

## 2021-12-21 DIAGNOSIS — N184 Chronic kidney disease, stage 4 (severe): Secondary | ICD-10-CM | POA: Diagnosis not present

## 2021-12-21 DIAGNOSIS — E43 Unspecified severe protein-calorie malnutrition: Secondary | ICD-10-CM | POA: Diagnosis not present

## 2021-12-21 DIAGNOSIS — I679 Cerebrovascular disease, unspecified: Secondary | ICD-10-CM | POA: Diagnosis not present

## 2021-12-21 DIAGNOSIS — Z8616 Personal history of COVID-19: Secondary | ICD-10-CM | POA: Diagnosis not present

## 2021-12-22 DIAGNOSIS — S80829A Blister (nonthermal), unspecified lower leg, initial encounter: Secondary | ICD-10-CM | POA: Diagnosis not present

## 2021-12-22 DIAGNOSIS — Z8616 Personal history of COVID-19: Secondary | ICD-10-CM | POA: Diagnosis not present

## 2021-12-22 DIAGNOSIS — I679 Cerebrovascular disease, unspecified: Secondary | ICD-10-CM | POA: Diagnosis not present

## 2021-12-22 DIAGNOSIS — D500 Iron deficiency anemia secondary to blood loss (chronic): Secondary | ICD-10-CM | POA: Diagnosis not present

## 2021-12-22 DIAGNOSIS — E43 Unspecified severe protein-calorie malnutrition: Secondary | ICD-10-CM | POA: Diagnosis not present

## 2021-12-22 DIAGNOSIS — Z9181 History of falling: Secondary | ICD-10-CM | POA: Diagnosis not present

## 2021-12-22 DIAGNOSIS — R600 Localized edema: Secondary | ICD-10-CM | POA: Diagnosis not present

## 2021-12-22 DIAGNOSIS — F39 Unspecified mood [affective] disorder: Secondary | ICD-10-CM | POA: Diagnosis not present

## 2021-12-22 DIAGNOSIS — N184 Chronic kidney disease, stage 4 (severe): Secondary | ICD-10-CM | POA: Diagnosis not present

## 2021-12-23 DIAGNOSIS — F39 Unspecified mood [affective] disorder: Secondary | ICD-10-CM | POA: Diagnosis not present

## 2021-12-23 DIAGNOSIS — I679 Cerebrovascular disease, unspecified: Secondary | ICD-10-CM | POA: Diagnosis not present

## 2021-12-23 DIAGNOSIS — N184 Chronic kidney disease, stage 4 (severe): Secondary | ICD-10-CM | POA: Diagnosis not present

## 2021-12-23 DIAGNOSIS — E43 Unspecified severe protein-calorie malnutrition: Secondary | ICD-10-CM | POA: Diagnosis not present

## 2021-12-23 DIAGNOSIS — Z8616 Personal history of COVID-19: Secondary | ICD-10-CM | POA: Diagnosis not present

## 2021-12-23 DIAGNOSIS — D500 Iron deficiency anemia secondary to blood loss (chronic): Secondary | ICD-10-CM | POA: Diagnosis not present

## 2021-12-24 DIAGNOSIS — M1990 Unspecified osteoarthritis, unspecified site: Secondary | ICD-10-CM | POA: Diagnosis not present

## 2021-12-24 DIAGNOSIS — E118 Type 2 diabetes mellitus with unspecified complications: Secondary | ICD-10-CM | POA: Diagnosis not present

## 2021-12-24 DIAGNOSIS — Z8616 Personal history of COVID-19: Secondary | ICD-10-CM | POA: Diagnosis not present

## 2021-12-24 DIAGNOSIS — F39 Unspecified mood [affective] disorder: Secondary | ICD-10-CM | POA: Diagnosis not present

## 2021-12-24 DIAGNOSIS — D500 Iron deficiency anemia secondary to blood loss (chronic): Secondary | ICD-10-CM | POA: Diagnosis not present

## 2021-12-24 DIAGNOSIS — F01511 Vascular dementia, unspecified severity, with agitation: Secondary | ICD-10-CM | POA: Diagnosis not present

## 2021-12-24 DIAGNOSIS — N184 Chronic kidney disease, stage 4 (severe): Secondary | ICD-10-CM | POA: Diagnosis not present

## 2021-12-24 DIAGNOSIS — I679 Cerebrovascular disease, unspecified: Secondary | ICD-10-CM | POA: Diagnosis not present

## 2021-12-24 DIAGNOSIS — E785 Hyperlipidemia, unspecified: Secondary | ICD-10-CM | POA: Diagnosis not present

## 2021-12-24 DIAGNOSIS — G2409 Other drug induced dystonia: Secondary | ICD-10-CM | POA: Diagnosis not present

## 2021-12-24 DIAGNOSIS — E43 Unspecified severe protein-calorie malnutrition: Secondary | ICD-10-CM | POA: Diagnosis not present

## 2021-12-29 DIAGNOSIS — E43 Unspecified severe protein-calorie malnutrition: Secondary | ICD-10-CM | POA: Diagnosis not present

## 2021-12-29 DIAGNOSIS — I679 Cerebrovascular disease, unspecified: Secondary | ICD-10-CM | POA: Diagnosis not present

## 2021-12-29 DIAGNOSIS — Z8616 Personal history of COVID-19: Secondary | ICD-10-CM | POA: Diagnosis not present

## 2021-12-29 DIAGNOSIS — D500 Iron deficiency anemia secondary to blood loss (chronic): Secondary | ICD-10-CM | POA: Diagnosis not present

## 2021-12-29 DIAGNOSIS — F39 Unspecified mood [affective] disorder: Secondary | ICD-10-CM | POA: Diagnosis not present

## 2021-12-29 DIAGNOSIS — N184 Chronic kidney disease, stage 4 (severe): Secondary | ICD-10-CM | POA: Diagnosis not present

## 2021-12-30 DIAGNOSIS — N184 Chronic kidney disease, stage 4 (severe): Secondary | ICD-10-CM | POA: Diagnosis not present

## 2021-12-30 DIAGNOSIS — Z8616 Personal history of COVID-19: Secondary | ICD-10-CM | POA: Diagnosis not present

## 2021-12-30 DIAGNOSIS — E43 Unspecified severe protein-calorie malnutrition: Secondary | ICD-10-CM | POA: Diagnosis not present

## 2021-12-30 DIAGNOSIS — D500 Iron deficiency anemia secondary to blood loss (chronic): Secondary | ICD-10-CM | POA: Diagnosis not present

## 2021-12-30 DIAGNOSIS — F39 Unspecified mood [affective] disorder: Secondary | ICD-10-CM | POA: Diagnosis not present

## 2021-12-30 DIAGNOSIS — I679 Cerebrovascular disease, unspecified: Secondary | ICD-10-CM | POA: Diagnosis not present

## 2022-01-03 DIAGNOSIS — E43 Unspecified severe protein-calorie malnutrition: Secondary | ICD-10-CM | POA: Diagnosis not present

## 2022-01-03 DIAGNOSIS — R635 Abnormal weight gain: Secondary | ICD-10-CM | POA: Diagnosis not present

## 2022-01-03 DIAGNOSIS — Z6821 Body mass index (BMI) 21.0-21.9, adult: Secondary | ICD-10-CM | POA: Diagnosis not present

## 2022-01-03 DIAGNOSIS — N184 Chronic kidney disease, stage 4 (severe): Secondary | ICD-10-CM | POA: Diagnosis not present

## 2022-01-03 DIAGNOSIS — F39 Unspecified mood [affective] disorder: Secondary | ICD-10-CM | POA: Diagnosis not present

## 2022-01-03 DIAGNOSIS — D500 Iron deficiency anemia secondary to blood loss (chronic): Secondary | ICD-10-CM | POA: Diagnosis not present

## 2022-01-03 DIAGNOSIS — Z8616 Personal history of COVID-19: Secondary | ICD-10-CM | POA: Diagnosis not present

## 2022-01-03 DIAGNOSIS — I679 Cerebrovascular disease, unspecified: Secondary | ICD-10-CM | POA: Diagnosis not present

## 2022-01-03 DIAGNOSIS — R600 Localized edema: Secondary | ICD-10-CM | POA: Diagnosis not present

## 2022-01-04 DIAGNOSIS — I679 Cerebrovascular disease, unspecified: Secondary | ICD-10-CM | POA: Diagnosis not present

## 2022-01-04 DIAGNOSIS — F39 Unspecified mood [affective] disorder: Secondary | ICD-10-CM | POA: Diagnosis not present

## 2022-01-04 DIAGNOSIS — D500 Iron deficiency anemia secondary to blood loss (chronic): Secondary | ICD-10-CM | POA: Diagnosis not present

## 2022-01-04 DIAGNOSIS — N184 Chronic kidney disease, stage 4 (severe): Secondary | ICD-10-CM | POA: Diagnosis not present

## 2022-01-04 DIAGNOSIS — Z8616 Personal history of COVID-19: Secondary | ICD-10-CM | POA: Diagnosis not present

## 2022-01-04 DIAGNOSIS — E43 Unspecified severe protein-calorie malnutrition: Secondary | ICD-10-CM | POA: Diagnosis not present

## 2022-01-05 ENCOUNTER — Emergency Department (HOSPITAL_COMMUNITY)
Admission: EM | Admit: 2022-01-05 | Discharge: 2022-01-06 | Disposition: A | Discharge status: Skilled Nursing Facility | Payer: Medicare Other | Attending: Emergency Medicine | Admitting: Emergency Medicine

## 2022-01-05 ENCOUNTER — Emergency Department (HOSPITAL_COMMUNITY): Payer: Medicare Other

## 2022-01-05 DIAGNOSIS — I700 Atherosclerosis of aorta: Secondary | ICD-10-CM | POA: Diagnosis not present

## 2022-01-05 DIAGNOSIS — N184 Chronic kidney disease, stage 4 (severe): Secondary | ICD-10-CM | POA: Diagnosis not present

## 2022-01-05 DIAGNOSIS — E8809 Other disorders of plasma-protein metabolism, not elsewhere classified: Secondary | ICD-10-CM

## 2022-01-05 DIAGNOSIS — R531 Weakness: Secondary | ICD-10-CM | POA: Diagnosis not present

## 2022-01-05 DIAGNOSIS — R7989 Other specified abnormal findings of blood chemistry: Secondary | ICD-10-CM | POA: Diagnosis not present

## 2022-01-05 DIAGNOSIS — F0390 Unspecified dementia without behavioral disturbance: Secondary | ICD-10-CM

## 2022-01-05 DIAGNOSIS — R0681 Apnea, not elsewhere classified: Secondary | ICD-10-CM | POA: Diagnosis not present

## 2022-01-05 DIAGNOSIS — R609 Edema, unspecified: Secondary | ICD-10-CM | POA: Diagnosis not present

## 2022-01-05 DIAGNOSIS — E43 Unspecified severe protein-calorie malnutrition: Secondary | ICD-10-CM | POA: Diagnosis not present

## 2022-01-05 DIAGNOSIS — I679 Cerebrovascular disease, unspecified: Secondary | ICD-10-CM | POA: Diagnosis not present

## 2022-01-05 DIAGNOSIS — I1310 Hypertensive heart and chronic kidney disease without heart failure, with stage 1 through stage 4 chronic kidney disease, or unspecified chronic kidney disease: Secondary | ICD-10-CM | POA: Diagnosis not present

## 2022-01-05 DIAGNOSIS — R451 Restlessness and agitation: Secondary | ICD-10-CM | POA: Diagnosis not present

## 2022-01-05 DIAGNOSIS — Z79899 Other long term (current) drug therapy: Secondary | ICD-10-CM | POA: Insufficient documentation

## 2022-01-05 DIAGNOSIS — I517 Cardiomegaly: Secondary | ICD-10-CM | POA: Diagnosis not present

## 2022-01-05 DIAGNOSIS — R600 Localized edema: Secondary | ICD-10-CM | POA: Diagnosis not present

## 2022-01-05 DIAGNOSIS — E08311 Diabetes mellitus due to underlying condition with unspecified diabetic retinopathy with macular edema: Secondary | ICD-10-CM | POA: Diagnosis not present

## 2022-01-05 DIAGNOSIS — I10 Essential (primary) hypertension: Secondary | ICD-10-CM | POA: Diagnosis not present

## 2022-01-05 DIAGNOSIS — D509 Iron deficiency anemia, unspecified: Secondary | ICD-10-CM | POA: Diagnosis not present

## 2022-01-05 DIAGNOSIS — I129 Hypertensive chronic kidney disease with stage 1 through stage 4 chronic kidney disease, or unspecified chronic kidney disease: Secondary | ICD-10-CM | POA: Diagnosis not present

## 2022-01-05 DIAGNOSIS — D518 Other vitamin B12 deficiency anemias: Secondary | ICD-10-CM | POA: Diagnosis not present

## 2022-01-05 DIAGNOSIS — F39 Unspecified mood [affective] disorder: Secondary | ICD-10-CM | POA: Diagnosis not present

## 2022-01-05 DIAGNOSIS — Z8616 Personal history of COVID-19: Secondary | ICD-10-CM | POA: Diagnosis not present

## 2022-01-05 DIAGNOSIS — F03911 Unspecified dementia, unspecified severity, with agitation: Secondary | ICD-10-CM | POA: Insufficient documentation

## 2022-01-05 DIAGNOSIS — D500 Iron deficiency anemia secondary to blood loss (chronic): Secondary | ICD-10-CM | POA: Diagnosis not present

## 2022-01-05 DIAGNOSIS — N178 Other acute kidney failure: Secondary | ICD-10-CM | POA: Diagnosis not present

## 2022-01-05 DIAGNOSIS — N189 Chronic kidney disease, unspecified: Secondary | ICD-10-CM | POA: Diagnosis not present

## 2022-01-05 DIAGNOSIS — I5032 Chronic diastolic (congestive) heart failure: Secondary | ICD-10-CM | POA: Diagnosis not present

## 2022-01-05 DIAGNOSIS — T7840XA Allergy, unspecified, initial encounter: Secondary | ICD-10-CM | POA: Diagnosis not present

## 2022-01-05 DIAGNOSIS — I119 Hypertensive heart disease without heart failure: Secondary | ICD-10-CM | POA: Diagnosis not present

## 2022-01-05 LAB — BRAIN NATRIURETIC PEPTIDE: B Natriuretic Peptide: 4429.9 pg/mL — ABNORMAL HIGH (ref 0.0–100.0)

## 2022-01-05 LAB — URINALYSIS, ROUTINE W REFLEX MICROSCOPIC
Bilirubin Urine: NEGATIVE
Glucose, UA: NEGATIVE mg/dL
Hgb urine dipstick: NEGATIVE
Ketones, ur: NEGATIVE mg/dL
Leukocytes,Ua: NEGATIVE
Nitrite: NEGATIVE
Protein, ur: 100 mg/dL — AB
Specific Gravity, Urine: 1.015 (ref 1.005–1.030)
pH: 5 (ref 5.0–8.0)

## 2022-01-05 LAB — COMPREHENSIVE METABOLIC PANEL
ALT: 35 U/L (ref 0–44)
AST: 29 U/L (ref 15–41)
Albumin: 3.1 g/dL — ABNORMAL LOW (ref 3.5–5.0)
Alkaline Phosphatase: 128 U/L — ABNORMAL HIGH (ref 38–126)
Anion gap: 4 — ABNORMAL LOW (ref 5–15)
BUN: 56 mg/dL — ABNORMAL HIGH (ref 8–23)
CO2: 27 mmol/L (ref 22–32)
Calcium: 8.8 mg/dL — ABNORMAL LOW (ref 8.9–10.3)
Chloride: 110 mmol/L (ref 98–111)
Creatinine, Ser: 2.37 mg/dL — ABNORMAL HIGH (ref 0.44–1.00)
GFR, Estimated: 19 mL/min — ABNORMAL LOW (ref >60)
Glucose, Bld: 107 mg/dL — ABNORMAL HIGH (ref 70–99)
Potassium: 4.7 mmol/L (ref 3.5–5.1)
Sodium: 141 mmol/L (ref 135–145)
Total Bilirubin: 0.8 mg/dL (ref 0.3–1.2)
Total Protein: 6.2 g/dL — ABNORMAL LOW (ref 6.5–8.1)

## 2022-01-05 LAB — CBC
HCT: 29.7 % — ABNORMAL LOW (ref 36.0–46.0)
Hemoglobin: 9.4 g/dL — ABNORMAL LOW (ref 12.0–15.0)
MCH: 34.1 pg — ABNORMAL HIGH (ref 26.0–34.0)
MCHC: 31.6 g/dL (ref 30.0–36.0)
MCV: 107.6 fL — ABNORMAL HIGH (ref 80.0–100.0)
Platelets: 230 10*3/uL (ref 150–400)
RBC: 2.76 MIL/uL — ABNORMAL LOW (ref 3.87–5.11)
RDW: 16.7 % — ABNORMAL HIGH (ref 11.5–15.5)
WBC: 5.7 10*3/uL (ref 4.0–10.5)
nRBC: 0 % (ref 0.0–0.2)

## 2022-01-05 LAB — VALPROIC ACID LEVEL: Valproic Acid Lvl: 24 ug/mL — ABNORMAL LOW (ref 50.0–100.0)

## 2022-01-05 MED ORDER — SODIUM CHLORIDE 0.9 % IV BOLUS
500.0000 mL | Freq: Once | INTRAVENOUS | Status: AC
Start: 2022-01-05 — End: 2022-01-05
  Administered 2022-01-05: 500 mL via INTRAVENOUS

## 2022-01-05 MED ORDER — FUROSEMIDE 10 MG/ML IJ SOLN
40.0000 mg | Freq: Once | INTRAMUSCULAR | Status: AC
  Administered 2022-01-05: 40 mg via INTRAVENOUS
  Filled 2022-01-05: qty 4

## 2022-01-05 MED ORDER — CLONIDINE HCL 0.2 MG PO TABS
0.3000 mg | ORAL_TABLET | Freq: Once | ORAL | Status: AC
  Administered 2022-01-05: 0.3 mg via ORAL
  Filled 2022-01-05: qty 1

## 2022-01-05 MED ORDER — CARVEDILOL 12.5 MG PO TABS
25.0000 mg | ORAL_TABLET | Freq: Once | ORAL | Status: AC
  Administered 2022-01-05: 25 mg via ORAL
  Filled 2022-01-05: qty 2

## 2022-01-05 MED ORDER — FUROSEMIDE 20 MG PO TABS: 20.0000 mg | ORAL_TABLET | Freq: Every day | ORAL | 0 refills | Status: DC

## 2022-01-05 MED ORDER — HYDROXYZINE HCL 10 MG PO TABS
10.0000 mg | ORAL_TABLET | Freq: Once | ORAL | Status: AC
  Administered 2022-01-05: 10 mg via ORAL
  Filled 2022-01-05: qty 1

## 2022-01-05 NOTE — ED Notes (Addendum)
RN called [facility] to give report x2

## 2022-01-05 NOTE — Discharge Instructions (Addendum)
It was our pleasure to provide your ER care today - we hope that you feel better.  Follow up closely with your primary care doctor, as well as your hospice provider, for recheck, and to help further clarify goals of care.   Take lasix as prescribed for the next few days - follow up with your doctor in that time to discuss plan with meds.  Return to ER if worse, new symptoms, increased trouble breathing, or other concern.

## 2022-01-05 NOTE — ED Triage Notes (Signed)
Pt BI EMS from [facility]. Reports of facial, neck and stomach edema x2 weeks that was possibly worse today. SNF gave oral morphine and 50mg  benydryl for possible allergic reaction.  Hx dementia, stage 4 renal disease, deaf   Pt is a hospice pt   160/92 84 HR  12-22 CBG 153

## 2022-01-05 NOTE — ED Notes (Signed)
Water, crackers and applesauce provided for pt

## 2022-01-05 NOTE — ED Provider Notes (Addendum)
[facility] Provider Note   CSN: [id number] Arrival date & time: 01/05/22  1451     History  Chief Complaint  Patient presents with   Edema   Agitation    [name] is a 88 y.o. female.  Pt with hx dementia, presents via EMS from SNF as pt noted to appear swollen in legs, stomach and face area in past couple weeks. Pt with hx deafness, and previously able to communicate effectively via signing, but family/son indicates in recent times not able to focus long enough to communicate well - level 5 caveat. No report of fevers. No report of trauma or fall. No report of change in meds, except was given po morphine and benadryl for symptom relief. Pt is hospice care patient.   The history is provided by the patient, medical records, a relative and the EMS personnel. The history is limited by the condition of the patient.       Home Medications Prior to Admission medications   Medication Sig Start Date End Date Taking? Authorizing Provider  Ascorbic Acid (VITAMIN C) 500 MG CAPS 1 capsule DAILY (route: oral) 08/07/20   [provider]  aspirin 81 MG chewable tablet Chew 81 mg by mouth daily. (0700)    [provider]  B Complex Vitamins (B COMPLEX 1 PO) 1 tablet DAILY (route: oral) 08/07/20   [provider]  benztropine (COGENTIN) 0.5 MG tablet Take 0.5 mg by mouth 2 (two) times daily. 07/16/20   [provider]  brimonidine-timolol (COMBIGAN) 0.2-0.5 % ophthalmic solution Place 1 drop into both eyes 2 (two) times daily. (Separate from other eye drops by at least 10 minutes)    [provider]  carvedilol (COREG) 25 MG tablet Take 25 mg by mouth 2 (two) times daily. (0700 & 1900)    [provider]  cloNIDine (CATAPRES) 0.3 MG tablet Take 1 tablet (0.3 mg total) by mouth 2 (two) times daily. (0700) 10/19/19   Patrecia Pour, MD  Dextrose, Diabetic Use, (INSTA-GLUCOSE PO) Take 1 Dose by mouth daily  as needed (for blood sugar less than 70--recheck in 15 minutes.).    [provider]  diclofenac Sodium (VOLTAREN) 1 % GEL Per instructions 2 TIMES DAILY (route: topical) 08/07/20   [provider]  divalproex (DEPAKOTE) 125 MG DR tablet 1 tablet 2 TIMES DAILY (route: oral) 08/20/20   [provider]  docusate sodium (COLACE) 100 MG capsule Take 100 mg by mouth daily. (0700)    [provider]  donepezil (ARICEPT) 10 MG tablet Take 10 mg by mouth at bedtime. (1900)    [provider]  doxazosin (CARDURA) 8 MG tablet Take 16 mg by mouth at bedtime. (1900)    [provider]  escitalopram (LEXAPRO) 10 MG tablet Take 10 mg by mouth daily. (0700)    [provider]  ferrous sulfate 325 (65 FE) MG tablet Take 325 mg by mouth daily. (0700)    [provider]  Glycerin-Hypromellose-PEG 400 0.2-0.2-1 % SOLN Place 1 drop into both eyes 2 (two) times daily. (1200 and 1900)    [provider]  hydrOXYzine (ATARAX/VISTARIL) 10 MG tablet Take 10 mg by mouth 2 (two) times daily. 07/16/20   [provider]  ILEVRO 0.3 % ophthalmic suspension Place 1 drop into the left eye daily.  08/02/19   [provider]  lovastatin (MEVACOR) 10 MG tablet Take 10 mg by mouth at bedtime. 09/05/19  [provider]  mirtazapine (REMERON) 15 MG tablet Take 15 mg by mouth at bedtime.     [provider]  Nutritional Supplements (NUTRITIONAL DRINK PO) Take 1 each by mouth in the morning and at bedtime.    [provider]  Skin Protectants, Misc. (MINERIN CREME) CREA SPREAD TOPICALLY TO FEET ONCE DAILY **NOT BETWEEN TOES* 10/14/19   [provider]  traZODone (DESYREL) 50 MG tablet Take 50 mg by mouth at bedtime. (1900)    [provider]      Allergies    Patient has no known allergies.    Review of Systems   Review of Systems  Unable to perform ROS: Dementia    Physical Exam Updated Vital  Signs BP (!) 147/112   Pulse 83   Temp 97.6 F (36.4 C) (Axillary)   Resp (!) 26   SpO2 95%  Physical Exam Vitals and nursing note reviewed.  Constitutional:      Appearance: Normal appearance. She is well-developed.  HENT:     Head: Atraumatic.     Nose: Nose normal.     Mouth/Throat:     Mouth: Mucous membranes are moist.     Pharynx: Oropharynx is clear.  Eyes:     General: No scleral icterus.    Conjunctiva/sclera: Conjunctivae normal.     Pupils: Pupils are equal, round, and reactive to light.  Neck:     Vascular: No carotid bruit.     Trachea: No tracheal deviation.     Comments: No stiffness or rigidity, moves neck freely in all directions. Thyroid not grossly enlarged or tender.  Cardiovascular:     Rate and Rhythm: Normal rate and regular rhythm.     Pulses: Normal pulses.     Heart sounds: Normal heart sounds. No murmur heard.    No friction rub. No gallop.  Pulmonary:     Effort: Pulmonary effort is normal. No respiratory distress.     Breath sounds: Normal breath sounds.  Abdominal:     General: Bowel sounds are normal. There is no distension.     Palpations: Abdomen is soft. There is no mass.     Tenderness: There is no abdominal tenderness. There is no guarding.  Genitourinary:    Comments: No cva tenderness.  Musculoskeletal:     Cervical back: Normal range of motion and neck supple. No rigidity. No muscular tenderness.     Comments: Mild symmetric bil leg edema.   Skin:    General: Skin is warm and dry.     Findings: No rash.     Comments: No rash. No urticaria/hives. No angioedema.   Neurological:     Mental Status: She is alert.     Comments: Alert. Moves bil extremities purposefully with good strength. Family indicates pts mental status and functional ability appear c/w baseline. Family states pt very fidgety/restless at baseline. In wheelchair, not ambulatory, at baseline.   Psychiatric:        Mood and Affect: Mood normal.     ED Results /  Procedures / Treatments   Labs (all labs ordered are listed, but only abnormal results are displayed) Results for orders placed or performed during the hospital encounter of 01/05/22  CBC  Result Value Ref Range   WBC 5.7 4.0 - 10.5 K/uL   RBC 2.76 (L) 3.87 - 5.11 MIL/uL   Hemoglobin 9.4 (L) 12.0 - 15.0 g/dL   HCT 29.7 (L) 36.0 - 46.0 %   MCV 107.6 (H)  80.0 - 100.0 fL   MCH 34.1 (H) 26.0 - 34.0 pg   MCHC 31.6 30.0 - 36.0 g/dL   RDW 16.7 (H) 11.5 - 15.5 %   Platelets 230 150 - 400 K/uL   nRBC 0.0 0.0 - 0.2 %  Comprehensive metabolic panel  Result Value Ref Range   Sodium 141 135 - 145 mmol/L   Potassium 4.7 3.5 - 5.1 mmol/L   Chloride 110 98 - 111 mmol/L   CO2 27 22 - 32 mmol/L   Glucose, Bld 107 (H) 70 - 99 mg/dL   BUN 56 (H) 8 - 23 mg/dL   Creatinine, Ser 2.37 (H) 0.44 - 1.00 mg/dL   Calcium 8.8 (L) 8.9 - 10.3 mg/dL   Total Protein 6.2 (L) 6.5 - 8.1 g/dL   Albumin 3.1 (L) 3.5 - 5.0 g/dL   AST 29 15 - 41 U/L   ALT 35 0 - 44 U/L   Alkaline Phosphatase 128 (H) 38 - 126 U/L   Total Bilirubin 0.8 0.3 - 1.2 mg/dL   GFR, Estimated 19 (L) >60 mL/min   Anion gap 4 (L) 5 - 15  Brain natriuretic peptide  Result Value Ref Range   B Natriuretic Peptide 4,429.9 (H) 0.0 - 100.0 pg/mL  Urinalysis, Routine w reflex microscopic Urine, In & Out Cath  Result Value Ref Range   Color, Urine YELLOW YELLOW   APPearance HAZY (A) CLEAR   Specific Gravity, Urine 1.015 1.005 - 1.030   pH 5.0 5.0 - 8.0   Glucose, UA NEGATIVE NEGATIVE mg/dL   Hgb urine dipstick NEGATIVE NEGATIVE   Bilirubin Urine NEGATIVE NEGATIVE   Ketones, ur NEGATIVE NEGATIVE mg/dL   Protein, ur 100 (A) NEGATIVE mg/dL   Nitrite NEGATIVE NEGATIVE   Leukocytes,Ua NEGATIVE NEGATIVE   RBC / HPF 0-5 0 - 5 RBC/hpf   WBC, UA 0-5 0 - 5 WBC/hpf   Bacteria, UA RARE (A) NONE SEEN   Squamous Epithelial / LPF 0-5 0 - 5   Mucus PRESENT   Valproic acid level  Result Value Ref Range   Valproic Acid Lvl 24 (L) 50.0 - 100.0 ug/mL   DG  Chest Port 1 View  Result Date: 01/05/2022 CLINICAL DATA:  Facial neck and stomach edema EXAM: PORTABLE CHEST 1 VIEW COMPARISON:  08/04/2020, 10/13/2019, 10/11/2019, CT 10/07/2019 FINDINGS: Cardiomegaly. No acute airspace disease, pleural effusion or pneumothorax. Aortic atherosclerosis. Abnormal appearance of the bilateral shoulders with widened subacromial spaces. Question malalignment at the left glenohumeral interval with medial positioning of the humeral head. Possible lytic changes at the scapula and left humeral head. Possible erosive changes at the right scapula/glenoid. IMPRESSION: 1. Cardiomegaly without edema or pleural effusion 2. Abnormal appearance of bilateral shoulders with possible erosive changes involving the right glenoid and the left humeral head and glenoid fossa, findings could be inflammatory, infectious, or malignant in etiology. Electronically Signed   By: Donavan Foil M.D.   On: 01/05/2022 16:42      EKG EKG Interpretation  Date/Time:  Wednesday January 05 2022 15:05:46 EDT Ventricular Rate:  97 PR Interval:  159 QRS Duration: 79 QT Interval:  368 QTC Calculation: 468 R Axis:   51 Text Interpretation: Sinus rhythm Atrial premature complexes Nonspecific T wave abnormality Confirmed by Lajean Saver [phone]) on 01/05/2022 3:22:12 PM  Radiology DG Chest Port 1 View  Result Date: 01/05/2022 CLINICAL DATA:  Facial neck and stomach edema EXAM: PORTABLE CHEST 1 VIEW COMPARISON:  08/04/2020, 10/13/2019, 10/11/2019, CT 10/07/2019 FINDINGS: Cardiomegaly. No acute  airspace disease, pleural effusion or pneumothorax. Aortic atherosclerosis. Abnormal appearance of the bilateral shoulders with widened subacromial spaces. Question malalignment at the left glenohumeral interval with medial positioning of the humeral head. Possible lytic changes at the scapula and left humeral head. Possible erosive changes at the right scapula/glenoid. IMPRESSION: 1. Cardiomegaly without edema or  pleural effusion 2. Abnormal appearance of bilateral shoulders with possible erosive changes involving the right glenoid and the left humeral head and glenoid fossa, findings could be inflammatory, infectious, or malignant in etiology. Electronically Signed   By: Donavan Foil M.D.   On: 01/05/2022 16:42    Procedures Procedures    Medications Ordered in ED Medications - No data to display  ED Course/ Medical Decision Making/ A&P                           Medical Decision Making Problems Addressed: Chronic dementia [facility]): chronic illness or injury with exacerbation, progression, or side effects of treatment that poses a threat to life or bodily functions Chronic kidney disease (CKD), stage IV (severe) ([place]): chronic illness or injury with exacerbation, progression, or side effects of treatment that poses a threat to life or bodily functions Elevated brain natriuretic peptide (BNP) level: acute illness or injury Essential hypertension: chronic illness or injury with exacerbation, progression, or side effects of treatment that poses a threat to life or bodily functions Hypoalbuminemia: acute illness or injury Peripheral edema: acute illness or injury  Amount and/or Complexity of Data Reviewed Independent Historian: EMS    Details: Ems/family, hx External Data Reviewed: notes. Labs: ordered. Decision-making details documented in ED Course. Radiology: ordered and independent interpretation performed. Decision-making details documented in ED Course. ECG/medicine tests: ordered and independent interpretation performed. Decision-making details documented in ED Course.  Risk Prescription drug management. Decision regarding hospitalization.   Iv ns. Continuous pulse ox and cardiac monitoring. Labs ordered/sent. Imaging ordered.   Diff dx includes chf, aki, hypoalbuminemia, etc - dispo decision including potential need for admission considered - will get labs and imaging and reassess.    Reviewed nursing notes and prior charts for additional history. External reports reviewed. Additional history from: EMS, family.   Cardiac monitor: sinus rhythm, rate 94.  Labs reviewed/interpreted by me - wbc normal. Hgb c/w baseline. CKD  similar to baseline. Bnp is very high - no prior to compare. Given peripheral edema, high bnp - lasix iv. Albumin low - may also contribute to edema.   Xrays reviewed/interpreted by me - no frank edema. No pna.   Bp is elevated. Pt due for some of her evening meds. Clonidine po, carvedilol po, vistaril po.   Recheck, pt content appearing, no distress. Breathing comfortably. Pulse ox 100%.  Family affirms pt with hospice care - rec close f/u with her providers.   Pt currently appears stable for d/c.  Rec outpatient pcp/card f/u.  Return precautions provided.             Final Clinical Impression(s) / ED Diagnoses Final diagnoses:  None    Rx / DC Orders ED Discharge Orders     None           Lajean Saver, MD 01/05/22 1950

## 2022-01-06 DIAGNOSIS — E43 Unspecified severe protein-calorie malnutrition: Secondary | ICD-10-CM | POA: Diagnosis not present

## 2022-01-06 DIAGNOSIS — D500 Iron deficiency anemia secondary to blood loss (chronic): Secondary | ICD-10-CM | POA: Diagnosis not present

## 2022-01-06 DIAGNOSIS — Z8616 Personal history of COVID-19: Secondary | ICD-10-CM | POA: Diagnosis not present

## 2022-01-06 DIAGNOSIS — Z7401 Bed confinement status: Secondary | ICD-10-CM | POA: Diagnosis not present

## 2022-01-06 DIAGNOSIS — F39 Unspecified mood [affective] disorder: Secondary | ICD-10-CM | POA: Diagnosis not present

## 2022-01-06 DIAGNOSIS — R4182 Altered mental status, unspecified: Secondary | ICD-10-CM | POA: Diagnosis not present

## 2022-01-06 DIAGNOSIS — N184 Chronic kidney disease, stage 4 (severe): Secondary | ICD-10-CM | POA: Diagnosis not present

## 2022-01-06 DIAGNOSIS — I679 Cerebrovascular disease, unspecified: Secondary | ICD-10-CM | POA: Diagnosis not present

## 2022-01-06 NOTE — ED Notes (Signed)
Attempted to call [name] multiple times [phone]. No answer.

## 2022-01-06 NOTE — ED Provider Notes (Signed)
Blood pressure 97/78, pulse 67, temperature 97.9 F (36.6 C), temperature source Oral, resp. rate 18, SpO2 99 %.  Assuming care from Dr. Ashok Cordia.  In short, [name] is a 88 y.o. female with a chief complaint of Edema and Agitation .  Refer to the original H&P for additional details.  01:30 AM  Called back to the room to speak with family prior to transport.  Patient discharged by Dr. Ashok Cordia earlier in the shift.  Patient is intermittently agitated and appearing uncomfortable.  She is on hospice care and currently at a nursing facility.  Upon my arrival to the room the patient is resting comfortably and in no distress.  She has normal vital signs.  She does have as needed medication for agitation at the facility.  We discussed additional medication here but at this time the patient is calm and resting.  Transport is here to take her.  Family would prefer to transport her back to the facility and they can administer as needed meds as needed.     Margette Fast, MD 01/06/22 0140

## 2022-01-06 NOTE — ED Notes (Signed)
Pt having an episode of agitation upon [facility] arrival. This writer attempted to comfort the family and educate about the circumstances. Pt's family (son and daughter) request to see a physician. Dr. Laverta Baltimore made aware and at bedside to discuss plan of care with family and encourage treatment options at facility with hospice. Charge nurse made aware.

## 2022-01-07 DIAGNOSIS — F03C3 Unspecified dementia, severe, with mood disturbance: Secondary | ICD-10-CM | POA: Diagnosis not present

## 2022-01-07 DIAGNOSIS — N184 Chronic kidney disease, stage 4 (severe): Secondary | ICD-10-CM | POA: Diagnosis not present

## 2022-01-07 DIAGNOSIS — I509 Heart failure, unspecified: Secondary | ICD-10-CM | POA: Diagnosis not present

## 2022-01-07 DIAGNOSIS — I150 Renovascular hypertension: Secondary | ICD-10-CM | POA: Diagnosis not present

## 2022-01-07 DIAGNOSIS — G40909 Epilepsy, unspecified, not intractable, without status epilepticus: Secondary | ICD-10-CM | POA: Diagnosis not present

## 2022-01-07 DIAGNOSIS — F325 Major depressive disorder, single episode, in full remission: Secondary | ICD-10-CM | POA: Diagnosis not present

## 2022-01-10 DIAGNOSIS — N184 Chronic kidney disease, stage 4 (severe): Secondary | ICD-10-CM | POA: Diagnosis not present

## 2022-01-10 DIAGNOSIS — F39 Unspecified mood [affective] disorder: Secondary | ICD-10-CM | POA: Diagnosis not present

## 2022-01-10 DIAGNOSIS — I679 Cerebrovascular disease, unspecified: Secondary | ICD-10-CM | POA: Diagnosis not present

## 2022-01-10 DIAGNOSIS — Z8616 Personal history of COVID-19: Secondary | ICD-10-CM | POA: Diagnosis not present

## 2022-01-10 DIAGNOSIS — E43 Unspecified severe protein-calorie malnutrition: Secondary | ICD-10-CM | POA: Diagnosis not present

## 2022-01-10 DIAGNOSIS — D500 Iron deficiency anemia secondary to blood loss (chronic): Secondary | ICD-10-CM | POA: Diagnosis not present

## 2022-01-11 DIAGNOSIS — D500 Iron deficiency anemia secondary to blood loss (chronic): Secondary | ICD-10-CM | POA: Diagnosis not present

## 2022-01-11 DIAGNOSIS — E43 Unspecified severe protein-calorie malnutrition: Secondary | ICD-10-CM | POA: Diagnosis not present

## 2022-01-11 DIAGNOSIS — N184 Chronic kidney disease, stage 4 (severe): Secondary | ICD-10-CM | POA: Diagnosis not present

## 2022-01-11 DIAGNOSIS — Z8616 Personal history of COVID-19: Secondary | ICD-10-CM | POA: Diagnosis not present

## 2022-01-11 DIAGNOSIS — F39 Unspecified mood [affective] disorder: Secondary | ICD-10-CM | POA: Diagnosis not present

## 2022-01-11 DIAGNOSIS — I679 Cerebrovascular disease, unspecified: Secondary | ICD-10-CM | POA: Diagnosis not present

## 2022-01-13 DIAGNOSIS — I679 Cerebrovascular disease, unspecified: Secondary | ICD-10-CM | POA: Diagnosis not present

## 2022-01-13 DIAGNOSIS — D500 Iron deficiency anemia secondary to blood loss (chronic): Secondary | ICD-10-CM | POA: Diagnosis not present

## 2022-01-13 DIAGNOSIS — F39 Unspecified mood [affective] disorder: Secondary | ICD-10-CM | POA: Diagnosis not present

## 2022-01-13 DIAGNOSIS — N184 Chronic kidney disease, stage 4 (severe): Secondary | ICD-10-CM | POA: Diagnosis not present

## 2022-01-13 DIAGNOSIS — E43 Unspecified severe protein-calorie malnutrition: Secondary | ICD-10-CM | POA: Diagnosis not present

## 2022-01-13 DIAGNOSIS — Z8616 Personal history of COVID-19: Secondary | ICD-10-CM | POA: Diagnosis not present

## 2022-01-17 DIAGNOSIS — Z515 Encounter for palliative care: Secondary | ICD-10-CM | POA: Diagnosis not present

## 2022-01-17 DIAGNOSIS — I679 Cerebrovascular disease, unspecified: Secondary | ICD-10-CM | POA: Diagnosis not present

## 2022-01-17 DIAGNOSIS — H9193 Unspecified hearing loss, bilateral: Secondary | ICD-10-CM | POA: Diagnosis not present

## 2022-01-17 DIAGNOSIS — Z8673 Personal history of transient ischemic attack (TIA), and cerebral infarction without residual deficits: Secondary | ICD-10-CM | POA: Diagnosis not present

## 2022-01-17 DIAGNOSIS — E1122 Type 2 diabetes mellitus with diabetic chronic kidney disease: Secondary | ICD-10-CM | POA: Diagnosis not present

## 2022-01-17 DIAGNOSIS — F325 Major depressive disorder, single episode, in full remission: Secondary | ICD-10-CM | POA: Diagnosis not present

## 2022-01-17 DIAGNOSIS — E43 Unspecified severe protein-calorie malnutrition: Secondary | ICD-10-CM | POA: Diagnosis not present

## 2022-01-17 DIAGNOSIS — F0393 Unspecified dementia, unspecified severity, with mood disturbance: Secondary | ICD-10-CM | POA: Diagnosis not present

## 2022-01-17 DIAGNOSIS — N184 Chronic kidney disease, stage 4 (severe): Secondary | ICD-10-CM | POA: Diagnosis not present

## 2022-01-17 DIAGNOSIS — Z8616 Personal history of COVID-19: Secondary | ICD-10-CM | POA: Diagnosis not present

## 2022-01-17 DIAGNOSIS — F39 Unspecified mood [affective] disorder: Secondary | ICD-10-CM | POA: Diagnosis not present

## 2022-01-17 DIAGNOSIS — D500 Iron deficiency anemia secondary to blood loss (chronic): Secondary | ICD-10-CM | POA: Diagnosis not present

## 2022-01-17 DIAGNOSIS — I150 Renovascular hypertension: Secondary | ICD-10-CM | POA: Diagnosis not present

## 2022-01-20 DIAGNOSIS — Z8616 Personal history of COVID-19: Secondary | ICD-10-CM | POA: Diagnosis not present

## 2022-01-20 DIAGNOSIS — N184 Chronic kidney disease, stage 4 (severe): Secondary | ICD-10-CM | POA: Diagnosis not present

## 2022-01-20 DIAGNOSIS — D500 Iron deficiency anemia secondary to blood loss (chronic): Secondary | ICD-10-CM | POA: Diagnosis not present

## 2022-01-20 DIAGNOSIS — I679 Cerebrovascular disease, unspecified: Secondary | ICD-10-CM | POA: Diagnosis not present

## 2022-01-20 DIAGNOSIS — F39 Unspecified mood [affective] disorder: Secondary | ICD-10-CM | POA: Diagnosis not present

## 2022-01-20 DIAGNOSIS — E43 Unspecified severe protein-calorie malnutrition: Secondary | ICD-10-CM | POA: Diagnosis not present

## 2022-01-23 DIAGNOSIS — F39 Unspecified mood [affective] disorder: Secondary | ICD-10-CM | POA: Diagnosis not present

## 2022-01-23 DIAGNOSIS — E118 Type 2 diabetes mellitus with unspecified complications: Secondary | ICD-10-CM | POA: Diagnosis not present

## 2022-01-23 DIAGNOSIS — I679 Cerebrovascular disease, unspecified: Secondary | ICD-10-CM | POA: Diagnosis not present

## 2022-01-23 DIAGNOSIS — E785 Hyperlipidemia, unspecified: Secondary | ICD-10-CM | POA: Diagnosis not present

## 2022-01-23 DIAGNOSIS — N184 Chronic kidney disease, stage 4 (severe): Secondary | ICD-10-CM | POA: Diagnosis not present

## 2022-01-23 DIAGNOSIS — F01511 Vascular dementia, unspecified severity, with agitation: Secondary | ICD-10-CM | POA: Diagnosis not present

## 2022-01-23 DIAGNOSIS — E43 Unspecified severe protein-calorie malnutrition: Secondary | ICD-10-CM | POA: Diagnosis not present

## 2022-01-23 DIAGNOSIS — M1990 Unspecified osteoarthritis, unspecified site: Secondary | ICD-10-CM | POA: Diagnosis not present

## 2022-01-23 DIAGNOSIS — I129 Hypertensive chronic kidney disease with stage 1 through stage 4 chronic kidney disease, or unspecified chronic kidney disease: Secondary | ICD-10-CM | POA: Diagnosis not present

## 2022-01-23 DIAGNOSIS — G2409 Other drug induced dystonia: Secondary | ICD-10-CM | POA: Diagnosis not present

## 2022-01-23 DIAGNOSIS — D500 Iron deficiency anemia secondary to blood loss (chronic): Secondary | ICD-10-CM | POA: Diagnosis not present

## 2022-01-23 DIAGNOSIS — Z8616 Personal history of COVID-19: Secondary | ICD-10-CM | POA: Diagnosis not present

## 2022-01-24 DIAGNOSIS — R600 Localized edema: Secondary | ICD-10-CM | POA: Diagnosis not present

## 2022-01-24 DIAGNOSIS — Z79899 Other long term (current) drug therapy: Secondary | ICD-10-CM | POA: Diagnosis not present

## 2022-01-24 DIAGNOSIS — I679 Cerebrovascular disease, unspecified: Secondary | ICD-10-CM | POA: Diagnosis not present

## 2022-01-24 DIAGNOSIS — R220 Localized swelling, mass and lump, head: Secondary | ICD-10-CM | POA: Diagnosis not present

## 2022-01-24 DIAGNOSIS — I129 Hypertensive chronic kidney disease with stage 1 through stage 4 chronic kidney disease, or unspecified chronic kidney disease: Secondary | ICD-10-CM | POA: Diagnosis not present

## 2022-01-24 DIAGNOSIS — F39 Unspecified mood [affective] disorder: Secondary | ICD-10-CM | POA: Diagnosis not present

## 2022-01-24 DIAGNOSIS — N184 Chronic kidney disease, stage 4 (severe): Secondary | ICD-10-CM | POA: Diagnosis not present

## 2022-01-24 DIAGNOSIS — Z8616 Personal history of COVID-19: Secondary | ICD-10-CM | POA: Diagnosis not present

## 2022-01-24 DIAGNOSIS — F0390 Unspecified dementia without behavioral disturbance: Secondary | ICD-10-CM | POA: Diagnosis not present

## 2022-01-24 DIAGNOSIS — D500 Iron deficiency anemia secondary to blood loss (chronic): Secondary | ICD-10-CM | POA: Diagnosis not present

## 2022-01-26 DIAGNOSIS — Z8616 Personal history of COVID-19: Secondary | ICD-10-CM | POA: Diagnosis not present

## 2022-01-26 DIAGNOSIS — D500 Iron deficiency anemia secondary to blood loss (chronic): Secondary | ICD-10-CM | POA: Diagnosis not present

## 2022-01-26 DIAGNOSIS — F39 Unspecified mood [affective] disorder: Secondary | ICD-10-CM | POA: Diagnosis not present

## 2022-01-26 DIAGNOSIS — I129 Hypertensive chronic kidney disease with stage 1 through stage 4 chronic kidney disease, or unspecified chronic kidney disease: Secondary | ICD-10-CM | POA: Diagnosis not present

## 2022-01-26 DIAGNOSIS — N184 Chronic kidney disease, stage 4 (severe): Secondary | ICD-10-CM | POA: Diagnosis not present

## 2022-01-26 DIAGNOSIS — I679 Cerebrovascular disease, unspecified: Secondary | ICD-10-CM | POA: Diagnosis not present

## 2022-01-26 DIAGNOSIS — Z23 Encounter for immunization: Secondary | ICD-10-CM | POA: Diagnosis not present

## 2022-01-27 DIAGNOSIS — I129 Hypertensive chronic kidney disease with stage 1 through stage 4 chronic kidney disease, or unspecified chronic kidney disease: Secondary | ICD-10-CM | POA: Diagnosis not present

## 2022-01-27 DIAGNOSIS — F39 Unspecified mood [affective] disorder: Secondary | ICD-10-CM | POA: Diagnosis not present

## 2022-01-27 DIAGNOSIS — I679 Cerebrovascular disease, unspecified: Secondary | ICD-10-CM | POA: Diagnosis not present

## 2022-01-27 DIAGNOSIS — D500 Iron deficiency anemia secondary to blood loss (chronic): Secondary | ICD-10-CM | POA: Diagnosis not present

## 2022-01-27 DIAGNOSIS — Z8616 Personal history of COVID-19: Secondary | ICD-10-CM | POA: Diagnosis not present

## 2022-01-27 DIAGNOSIS — N184 Chronic kidney disease, stage 4 (severe): Secondary | ICD-10-CM | POA: Diagnosis not present

## 2022-01-28 DIAGNOSIS — Z8616 Personal history of COVID-19: Secondary | ICD-10-CM | POA: Diagnosis not present

## 2022-01-28 DIAGNOSIS — I129 Hypertensive chronic kidney disease with stage 1 through stage 4 chronic kidney disease, or unspecified chronic kidney disease: Secondary | ICD-10-CM | POA: Diagnosis not present

## 2022-01-28 DIAGNOSIS — D500 Iron deficiency anemia secondary to blood loss (chronic): Secondary | ICD-10-CM | POA: Diagnosis not present

## 2022-01-28 DIAGNOSIS — N184 Chronic kidney disease, stage 4 (severe): Secondary | ICD-10-CM | POA: Diagnosis not present

## 2022-01-28 DIAGNOSIS — I679 Cerebrovascular disease, unspecified: Secondary | ICD-10-CM | POA: Diagnosis not present

## 2022-01-28 DIAGNOSIS — F39 Unspecified mood [affective] disorder: Secondary | ICD-10-CM | POA: Diagnosis not present

## 2022-01-31 DIAGNOSIS — D500 Iron deficiency anemia secondary to blood loss (chronic): Secondary | ICD-10-CM | POA: Diagnosis not present

## 2022-01-31 DIAGNOSIS — F39 Unspecified mood [affective] disorder: Secondary | ICD-10-CM | POA: Diagnosis not present

## 2022-01-31 DIAGNOSIS — I129 Hypertensive chronic kidney disease with stage 1 through stage 4 chronic kidney disease, or unspecified chronic kidney disease: Secondary | ICD-10-CM | POA: Diagnosis not present

## 2022-01-31 DIAGNOSIS — Z8616 Personal history of COVID-19: Secondary | ICD-10-CM | POA: Diagnosis not present

## 2022-01-31 DIAGNOSIS — I679 Cerebrovascular disease, unspecified: Secondary | ICD-10-CM | POA: Diagnosis not present

## 2022-01-31 DIAGNOSIS — N184 Chronic kidney disease, stage 4 (severe): Secondary | ICD-10-CM | POA: Diagnosis not present

## 2022-02-01 DIAGNOSIS — F39 Unspecified mood [affective] disorder: Secondary | ICD-10-CM | POA: Diagnosis not present

## 2022-02-01 DIAGNOSIS — D500 Iron deficiency anemia secondary to blood loss (chronic): Secondary | ICD-10-CM | POA: Diagnosis not present

## 2022-02-01 DIAGNOSIS — Z8616 Personal history of COVID-19: Secondary | ICD-10-CM | POA: Diagnosis not present

## 2022-02-01 DIAGNOSIS — I129 Hypertensive chronic kidney disease with stage 1 through stage 4 chronic kidney disease, or unspecified chronic kidney disease: Secondary | ICD-10-CM | POA: Diagnosis not present

## 2022-02-01 DIAGNOSIS — N184 Chronic kidney disease, stage 4 (severe): Secondary | ICD-10-CM | POA: Diagnosis not present

## 2022-02-01 DIAGNOSIS — I679 Cerebrovascular disease, unspecified: Secondary | ICD-10-CM | POA: Diagnosis not present

## 2022-02-03 DIAGNOSIS — I129 Hypertensive chronic kidney disease with stage 1 through stage 4 chronic kidney disease, or unspecified chronic kidney disease: Secondary | ICD-10-CM | POA: Diagnosis not present

## 2022-02-03 DIAGNOSIS — Z8616 Personal history of COVID-19: Secondary | ICD-10-CM | POA: Diagnosis not present

## 2022-02-03 DIAGNOSIS — F39 Unspecified mood [affective] disorder: Secondary | ICD-10-CM | POA: Diagnosis not present

## 2022-02-03 DIAGNOSIS — D500 Iron deficiency anemia secondary to blood loss (chronic): Secondary | ICD-10-CM | POA: Diagnosis not present

## 2022-02-03 DIAGNOSIS — N184 Chronic kidney disease, stage 4 (severe): Secondary | ICD-10-CM | POA: Diagnosis not present

## 2022-02-03 DIAGNOSIS — I679 Cerebrovascular disease, unspecified: Secondary | ICD-10-CM | POA: Diagnosis not present

## 2022-02-06 DIAGNOSIS — F39 Unspecified mood [affective] disorder: Secondary | ICD-10-CM | POA: Diagnosis not present

## 2022-02-06 DIAGNOSIS — I679 Cerebrovascular disease, unspecified: Secondary | ICD-10-CM | POA: Diagnosis not present

## 2022-02-06 DIAGNOSIS — Z8616 Personal history of COVID-19: Secondary | ICD-10-CM | POA: Diagnosis not present

## 2022-02-06 DIAGNOSIS — I129 Hypertensive chronic kidney disease with stage 1 through stage 4 chronic kidney disease, or unspecified chronic kidney disease: Secondary | ICD-10-CM | POA: Diagnosis not present

## 2022-02-06 DIAGNOSIS — N184 Chronic kidney disease, stage 4 (severe): Secondary | ICD-10-CM | POA: Diagnosis not present

## 2022-02-06 DIAGNOSIS — D500 Iron deficiency anemia secondary to blood loss (chronic): Secondary | ICD-10-CM | POA: Diagnosis not present

## 2022-02-07 DIAGNOSIS — I679 Cerebrovascular disease, unspecified: Secondary | ICD-10-CM | POA: Diagnosis not present

## 2022-02-07 DIAGNOSIS — Z8616 Personal history of COVID-19: Secondary | ICD-10-CM | POA: Diagnosis not present

## 2022-02-07 DIAGNOSIS — Z9981 Dependence on supplemental oxygen: Secondary | ICD-10-CM | POA: Diagnosis not present

## 2022-02-07 DIAGNOSIS — J9600 Acute respiratory failure, unspecified whether with hypoxia or hypercapnia: Secondary | ICD-10-CM | POA: Diagnosis not present

## 2022-02-07 DIAGNOSIS — D500 Iron deficiency anemia secondary to blood loss (chronic): Secondary | ICD-10-CM | POA: Diagnosis not present

## 2022-02-07 DIAGNOSIS — Z681 Body mass index (BMI) 19 or less, adult: Secondary | ICD-10-CM | POA: Diagnosis not present

## 2022-02-07 DIAGNOSIS — I129 Hypertensive chronic kidney disease with stage 1 through stage 4 chronic kidney disease, or unspecified chronic kidney disease: Secondary | ICD-10-CM | POA: Diagnosis not present

## 2022-02-07 DIAGNOSIS — N184 Chronic kidney disease, stage 4 (severe): Secondary | ICD-10-CM | POA: Diagnosis not present

## 2022-02-07 DIAGNOSIS — E46 Unspecified protein-calorie malnutrition: Secondary | ICD-10-CM | POA: Diagnosis not present

## 2022-02-07 DIAGNOSIS — F39 Unspecified mood [affective] disorder: Secondary | ICD-10-CM | POA: Diagnosis not present

## 2022-02-07 DIAGNOSIS — R296 Repeated falls: Secondary | ICD-10-CM | POA: Diagnosis not present

## 2022-02-09 DIAGNOSIS — I129 Hypertensive chronic kidney disease with stage 1 through stage 4 chronic kidney disease, or unspecified chronic kidney disease: Secondary | ICD-10-CM | POA: Diagnosis not present

## 2022-02-09 DIAGNOSIS — D500 Iron deficiency anemia secondary to blood loss (chronic): Secondary | ICD-10-CM | POA: Diagnosis not present

## 2022-02-09 DIAGNOSIS — Z8616 Personal history of COVID-19: Secondary | ICD-10-CM | POA: Diagnosis not present

## 2022-02-09 DIAGNOSIS — F39 Unspecified mood [affective] disorder: Secondary | ICD-10-CM | POA: Diagnosis not present

## 2022-02-09 DIAGNOSIS — N184 Chronic kidney disease, stage 4 (severe): Secondary | ICD-10-CM | POA: Diagnosis not present

## 2022-02-09 DIAGNOSIS — I679 Cerebrovascular disease, unspecified: Secondary | ICD-10-CM | POA: Diagnosis not present

## 2022-02-11 DIAGNOSIS — D500 Iron deficiency anemia secondary to blood loss (chronic): Secondary | ICD-10-CM | POA: Diagnosis not present

## 2022-02-11 DIAGNOSIS — I679 Cerebrovascular disease, unspecified: Secondary | ICD-10-CM | POA: Diagnosis not present

## 2022-02-11 DIAGNOSIS — I129 Hypertensive chronic kidney disease with stage 1 through stage 4 chronic kidney disease, or unspecified chronic kidney disease: Secondary | ICD-10-CM | POA: Diagnosis not present

## 2022-02-11 DIAGNOSIS — Z8616 Personal history of COVID-19: Secondary | ICD-10-CM | POA: Diagnosis not present

## 2022-02-11 DIAGNOSIS — N184 Chronic kidney disease, stage 4 (severe): Secondary | ICD-10-CM | POA: Diagnosis not present

## 2022-02-11 DIAGNOSIS — F39 Unspecified mood [affective] disorder: Secondary | ICD-10-CM | POA: Diagnosis not present

## 2022-02-14 DIAGNOSIS — Z8616 Personal history of COVID-19: Secondary | ICD-10-CM | POA: Diagnosis not present

## 2022-02-14 DIAGNOSIS — N184 Chronic kidney disease, stage 4 (severe): Secondary | ICD-10-CM | POA: Diagnosis not present

## 2022-02-14 DIAGNOSIS — I129 Hypertensive chronic kidney disease with stage 1 through stage 4 chronic kidney disease, or unspecified chronic kidney disease: Secondary | ICD-10-CM | POA: Diagnosis not present

## 2022-02-14 DIAGNOSIS — D500 Iron deficiency anemia secondary to blood loss (chronic): Secondary | ICD-10-CM | POA: Diagnosis not present

## 2022-02-14 DIAGNOSIS — F39 Unspecified mood [affective] disorder: Secondary | ICD-10-CM | POA: Diagnosis not present

## 2022-02-14 DIAGNOSIS — I679 Cerebrovascular disease, unspecified: Secondary | ICD-10-CM | POA: Diagnosis not present

## 2022-02-16 DIAGNOSIS — D500 Iron deficiency anemia secondary to blood loss (chronic): Secondary | ICD-10-CM | POA: Diagnosis not present

## 2022-02-16 DIAGNOSIS — Z8616 Personal history of COVID-19: Secondary | ICD-10-CM | POA: Diagnosis not present

## 2022-02-16 DIAGNOSIS — I679 Cerebrovascular disease, unspecified: Secondary | ICD-10-CM | POA: Diagnosis not present

## 2022-02-16 DIAGNOSIS — N184 Chronic kidney disease, stage 4 (severe): Secondary | ICD-10-CM | POA: Diagnosis not present

## 2022-02-16 DIAGNOSIS — F39 Unspecified mood [affective] disorder: Secondary | ICD-10-CM | POA: Diagnosis not present

## 2022-02-16 DIAGNOSIS — I129 Hypertensive chronic kidney disease with stage 1 through stage 4 chronic kidney disease, or unspecified chronic kidney disease: Secondary | ICD-10-CM | POA: Diagnosis not present

## 2022-02-17 DIAGNOSIS — F39 Unspecified mood [affective] disorder: Secondary | ICD-10-CM | POA: Diagnosis not present

## 2022-02-17 DIAGNOSIS — Z8616 Personal history of COVID-19: Secondary | ICD-10-CM | POA: Diagnosis not present

## 2022-02-17 DIAGNOSIS — I129 Hypertensive chronic kidney disease with stage 1 through stage 4 chronic kidney disease, or unspecified chronic kidney disease: Secondary | ICD-10-CM | POA: Diagnosis not present

## 2022-02-17 DIAGNOSIS — N184 Chronic kidney disease, stage 4 (severe): Secondary | ICD-10-CM | POA: Diagnosis not present

## 2022-02-17 DIAGNOSIS — I679 Cerebrovascular disease, unspecified: Secondary | ICD-10-CM | POA: Diagnosis not present

## 2022-02-17 DIAGNOSIS — D500 Iron deficiency anemia secondary to blood loss (chronic): Secondary | ICD-10-CM | POA: Diagnosis not present

## 2022-02-21 DIAGNOSIS — I679 Cerebrovascular disease, unspecified: Secondary | ICD-10-CM | POA: Diagnosis not present

## 2022-02-21 DIAGNOSIS — F39 Unspecified mood [affective] disorder: Secondary | ICD-10-CM | POA: Diagnosis not present

## 2022-02-21 DIAGNOSIS — Z8616 Personal history of COVID-19: Secondary | ICD-10-CM | POA: Diagnosis not present

## 2022-02-21 DIAGNOSIS — N184 Chronic kidney disease, stage 4 (severe): Secondary | ICD-10-CM | POA: Diagnosis not present

## 2022-02-21 DIAGNOSIS — I129 Hypertensive chronic kidney disease with stage 1 through stage 4 chronic kidney disease, or unspecified chronic kidney disease: Secondary | ICD-10-CM | POA: Diagnosis not present

## 2022-02-21 DIAGNOSIS — D500 Iron deficiency anemia secondary to blood loss (chronic): Secondary | ICD-10-CM | POA: Diagnosis not present

## 2022-02-23 DIAGNOSIS — I679 Cerebrovascular disease, unspecified: Secondary | ICD-10-CM | POA: Diagnosis not present

## 2022-02-23 DIAGNOSIS — N184 Chronic kidney disease, stage 4 (severe): Secondary | ICD-10-CM | POA: Diagnosis not present

## 2022-02-23 DIAGNOSIS — E785 Hyperlipidemia, unspecified: Secondary | ICD-10-CM | POA: Diagnosis not present

## 2022-02-23 DIAGNOSIS — F39 Unspecified mood [affective] disorder: Secondary | ICD-10-CM | POA: Diagnosis not present

## 2022-02-23 DIAGNOSIS — Z8616 Personal history of COVID-19: Secondary | ICD-10-CM | POA: Diagnosis not present

## 2022-02-23 DIAGNOSIS — I129 Hypertensive chronic kidney disease with stage 1 through stage 4 chronic kidney disease, or unspecified chronic kidney disease: Secondary | ICD-10-CM | POA: Diagnosis not present

## 2022-02-23 DIAGNOSIS — E43 Unspecified severe protein-calorie malnutrition: Secondary | ICD-10-CM | POA: Diagnosis not present

## 2022-02-23 DIAGNOSIS — F01511 Vascular dementia, unspecified severity, with agitation: Secondary | ICD-10-CM | POA: Diagnosis not present

## 2022-02-23 DIAGNOSIS — E118 Type 2 diabetes mellitus with unspecified complications: Secondary | ICD-10-CM | POA: Diagnosis not present

## 2022-02-23 DIAGNOSIS — D500 Iron deficiency anemia secondary to blood loss (chronic): Secondary | ICD-10-CM | POA: Diagnosis not present

## 2022-02-23 DIAGNOSIS — G2409 Other drug induced dystonia: Secondary | ICD-10-CM | POA: Diagnosis not present

## 2022-02-23 DIAGNOSIS — M1990 Unspecified osteoarthritis, unspecified site: Secondary | ICD-10-CM | POA: Diagnosis not present

## 2022-02-24 DIAGNOSIS — I517 Cardiomegaly: Secondary | ICD-10-CM | POA: Diagnosis not present

## 2022-02-24 DIAGNOSIS — R0602 Shortness of breath: Secondary | ICD-10-CM | POA: Diagnosis not present

## 2022-02-25 DIAGNOSIS — I129 Hypertensive chronic kidney disease with stage 1 through stage 4 chronic kidney disease, or unspecified chronic kidney disease: Secondary | ICD-10-CM | POA: Diagnosis not present

## 2022-02-25 DIAGNOSIS — N184 Chronic kidney disease, stage 4 (severe): Secondary | ICD-10-CM | POA: Diagnosis not present

## 2022-02-25 DIAGNOSIS — D500 Iron deficiency anemia secondary to blood loss (chronic): Secondary | ICD-10-CM | POA: Diagnosis not present

## 2022-02-25 DIAGNOSIS — F39 Unspecified mood [affective] disorder: Secondary | ICD-10-CM | POA: Diagnosis not present

## 2022-02-25 DIAGNOSIS — I679 Cerebrovascular disease, unspecified: Secondary | ICD-10-CM | POA: Diagnosis not present

## 2022-02-25 DIAGNOSIS — Z8616 Personal history of COVID-19: Secondary | ICD-10-CM | POA: Diagnosis not present

## 2022-02-28 DIAGNOSIS — D500 Iron deficiency anemia secondary to blood loss (chronic): Secondary | ICD-10-CM | POA: Diagnosis not present

## 2022-02-28 DIAGNOSIS — Z8616 Personal history of COVID-19: Secondary | ICD-10-CM | POA: Diagnosis not present

## 2022-02-28 DIAGNOSIS — N184 Chronic kidney disease, stage 4 (severe): Secondary | ICD-10-CM | POA: Diagnosis not present

## 2022-02-28 DIAGNOSIS — F39 Unspecified mood [affective] disorder: Secondary | ICD-10-CM | POA: Diagnosis not present

## 2022-02-28 DIAGNOSIS — I129 Hypertensive chronic kidney disease with stage 1 through stage 4 chronic kidney disease, or unspecified chronic kidney disease: Secondary | ICD-10-CM | POA: Diagnosis not present

## 2022-02-28 DIAGNOSIS — I679 Cerebrovascular disease, unspecified: Secondary | ICD-10-CM | POA: Diagnosis not present

## 2022-03-01 DIAGNOSIS — I679 Cerebrovascular disease, unspecified: Secondary | ICD-10-CM | POA: Diagnosis not present

## 2022-03-01 DIAGNOSIS — N184 Chronic kidney disease, stage 4 (severe): Secondary | ICD-10-CM | POA: Diagnosis not present

## 2022-03-01 DIAGNOSIS — D500 Iron deficiency anemia secondary to blood loss (chronic): Secondary | ICD-10-CM | POA: Diagnosis not present

## 2022-03-01 DIAGNOSIS — F39 Unspecified mood [affective] disorder: Secondary | ICD-10-CM | POA: Diagnosis not present

## 2022-03-01 DIAGNOSIS — Z8616 Personal history of COVID-19: Secondary | ICD-10-CM | POA: Diagnosis not present

## 2022-03-01 DIAGNOSIS — I129 Hypertensive chronic kidney disease with stage 1 through stage 4 chronic kidney disease, or unspecified chronic kidney disease: Secondary | ICD-10-CM | POA: Diagnosis not present

## 2022-03-02 DIAGNOSIS — Z23 Encounter for immunization: Secondary | ICD-10-CM | POA: Diagnosis not present

## 2022-03-02 DIAGNOSIS — N184 Chronic kidney disease, stage 4 (severe): Secondary | ICD-10-CM | POA: Diagnosis not present

## 2022-03-02 DIAGNOSIS — I129 Hypertensive chronic kidney disease with stage 1 through stage 4 chronic kidney disease, or unspecified chronic kidney disease: Secondary | ICD-10-CM | POA: Diagnosis not present

## 2022-03-02 DIAGNOSIS — I679 Cerebrovascular disease, unspecified: Secondary | ICD-10-CM | POA: Diagnosis not present

## 2022-03-02 DIAGNOSIS — Z8616 Personal history of COVID-19: Secondary | ICD-10-CM | POA: Diagnosis not present

## 2022-03-02 DIAGNOSIS — F39 Unspecified mood [affective] disorder: Secondary | ICD-10-CM | POA: Diagnosis not present

## 2022-03-02 DIAGNOSIS — D500 Iron deficiency anemia secondary to blood loss (chronic): Secondary | ICD-10-CM | POA: Diagnosis not present

## 2022-03-03 DIAGNOSIS — N184 Chronic kidney disease, stage 4 (severe): Secondary | ICD-10-CM | POA: Diagnosis not present

## 2022-03-03 DIAGNOSIS — D500 Iron deficiency anemia secondary to blood loss (chronic): Secondary | ICD-10-CM | POA: Diagnosis not present

## 2022-03-03 DIAGNOSIS — I129 Hypertensive chronic kidney disease with stage 1 through stage 4 chronic kidney disease, or unspecified chronic kidney disease: Secondary | ICD-10-CM | POA: Diagnosis not present

## 2022-03-03 DIAGNOSIS — Z8616 Personal history of COVID-19: Secondary | ICD-10-CM | POA: Diagnosis not present

## 2022-03-03 DIAGNOSIS — I679 Cerebrovascular disease, unspecified: Secondary | ICD-10-CM | POA: Diagnosis not present

## 2022-03-03 DIAGNOSIS — F39 Unspecified mood [affective] disorder: Secondary | ICD-10-CM | POA: Diagnosis not present

## 2022-03-04 DIAGNOSIS — Z8616 Personal history of COVID-19: Secondary | ICD-10-CM | POA: Diagnosis not present

## 2022-03-04 DIAGNOSIS — I10 Essential (primary) hypertension: Secondary | ICD-10-CM | POA: Diagnosis not present

## 2022-03-04 DIAGNOSIS — R059 Cough, unspecified: Secondary | ICD-10-CM | POA: Diagnosis not present

## 2022-03-04 DIAGNOSIS — D500 Iron deficiency anemia secondary to blood loss (chronic): Secondary | ICD-10-CM | POA: Diagnosis not present

## 2022-03-04 DIAGNOSIS — R509 Fever, unspecified: Secondary | ICD-10-CM | POA: Diagnosis not present

## 2022-03-04 DIAGNOSIS — F39 Unspecified mood [affective] disorder: Secondary | ICD-10-CM | POA: Diagnosis not present

## 2022-03-04 DIAGNOSIS — I679 Cerebrovascular disease, unspecified: Secondary | ICD-10-CM | POA: Diagnosis not present

## 2022-03-04 DIAGNOSIS — J189 Pneumonia, unspecified organism: Secondary | ICD-10-CM | POA: Diagnosis not present

## 2022-03-04 DIAGNOSIS — F0390 Unspecified dementia without behavioral disturbance: Secondary | ICD-10-CM | POA: Diagnosis not present

## 2022-03-04 DIAGNOSIS — N184 Chronic kidney disease, stage 4 (severe): Secondary | ICD-10-CM | POA: Diagnosis not present

## 2022-03-04 DIAGNOSIS — I129 Hypertensive chronic kidney disease with stage 1 through stage 4 chronic kidney disease, or unspecified chronic kidney disease: Secondary | ICD-10-CM | POA: Diagnosis not present

## 2022-03-04 DIAGNOSIS — R630 Anorexia: Secondary | ICD-10-CM | POA: Diagnosis not present

## 2022-03-06 DIAGNOSIS — I129 Hypertensive chronic kidney disease with stage 1 through stage 4 chronic kidney disease, or unspecified chronic kidney disease: Secondary | ICD-10-CM | POA: Diagnosis not present

## 2022-03-06 DIAGNOSIS — Z8616 Personal history of COVID-19: Secondary | ICD-10-CM | POA: Diagnosis not present

## 2022-03-06 DIAGNOSIS — I679 Cerebrovascular disease, unspecified: Secondary | ICD-10-CM | POA: Diagnosis not present

## 2022-03-06 DIAGNOSIS — D500 Iron deficiency anemia secondary to blood loss (chronic): Secondary | ICD-10-CM | POA: Diagnosis not present

## 2022-03-06 DIAGNOSIS — F39 Unspecified mood [affective] disorder: Secondary | ICD-10-CM | POA: Diagnosis not present

## 2022-03-06 DIAGNOSIS — N184 Chronic kidney disease, stage 4 (severe): Secondary | ICD-10-CM | POA: Diagnosis not present

## 2022-03-07 DIAGNOSIS — N390 Urinary tract infection, site not specified: Secondary | ICD-10-CM | POA: Diagnosis not present

## 2022-03-07 DIAGNOSIS — R1310 Dysphagia, unspecified: Secondary | ICD-10-CM | POA: Diagnosis not present

## 2022-03-07 DIAGNOSIS — J181 Lobar pneumonia, unspecified organism: Secondary | ICD-10-CM | POA: Diagnosis not present

## 2022-03-07 DIAGNOSIS — I10 Essential (primary) hypertension: Secondary | ICD-10-CM | POA: Diagnosis not present

## 2022-03-07 DIAGNOSIS — I129 Hypertensive chronic kidney disease with stage 1 through stage 4 chronic kidney disease, or unspecified chronic kidney disease: Secondary | ICD-10-CM | POA: Diagnosis not present

## 2022-03-07 DIAGNOSIS — L309 Dermatitis, unspecified: Secondary | ICD-10-CM | POA: Diagnosis not present

## 2022-03-07 DIAGNOSIS — I679 Cerebrovascular disease, unspecified: Secondary | ICD-10-CM | POA: Diagnosis not present

## 2022-03-07 DIAGNOSIS — Z8616 Personal history of COVID-19: Secondary | ICD-10-CM | POA: Diagnosis not present

## 2022-03-07 DIAGNOSIS — N184 Chronic kidney disease, stage 4 (severe): Secondary | ICD-10-CM | POA: Diagnosis not present

## 2022-03-07 DIAGNOSIS — D500 Iron deficiency anemia secondary to blood loss (chronic): Secondary | ICD-10-CM | POA: Diagnosis not present

## 2022-03-07 DIAGNOSIS — F39 Unspecified mood [affective] disorder: Secondary | ICD-10-CM | POA: Diagnosis not present

## 2022-03-07 DIAGNOSIS — Z9981 Dependence on supplemental oxygen: Secondary | ICD-10-CM | POA: Diagnosis not present

## 2022-03-07 DIAGNOSIS — Z9189 Other specified personal risk factors, not elsewhere classified: Secondary | ICD-10-CM | POA: Diagnosis not present

## 2022-03-07 DIAGNOSIS — F03C0 Unspecified dementia, severe, without behavioral disturbance, psychotic disturbance, mood disturbance, and anxiety: Secondary | ICD-10-CM | POA: Diagnosis not present

## 2022-03-08 DIAGNOSIS — F39 Unspecified mood [affective] disorder: Secondary | ICD-10-CM | POA: Diagnosis not present

## 2022-03-08 DIAGNOSIS — D500 Iron deficiency anemia secondary to blood loss (chronic): Secondary | ICD-10-CM | POA: Diagnosis not present

## 2022-03-08 DIAGNOSIS — Z8616 Personal history of COVID-19: Secondary | ICD-10-CM | POA: Diagnosis not present

## 2022-03-08 DIAGNOSIS — N184 Chronic kidney disease, stage 4 (severe): Secondary | ICD-10-CM | POA: Diagnosis not present

## 2022-03-08 DIAGNOSIS — I679 Cerebrovascular disease, unspecified: Secondary | ICD-10-CM | POA: Diagnosis not present

## 2022-03-08 DIAGNOSIS — I129 Hypertensive chronic kidney disease with stage 1 through stage 4 chronic kidney disease, or unspecified chronic kidney disease: Secondary | ICD-10-CM | POA: Diagnosis not present

## 2022-03-09 ENCOUNTER — Other Ambulatory Visit: Payer: Self-pay

## 2022-03-09 ENCOUNTER — Encounter (HOSPITAL_COMMUNITY): Payer: Self-pay

## 2022-03-09 ENCOUNTER — Inpatient Hospital Stay (HOSPITAL_COMMUNITY)
Admission: EM | Admit: 2022-03-09 | Discharge: 2022-03-16 | DRG: 640 | Disposition: A | Discharge status: Skilled Nursing Facility | Payer: Medicare Other | Source: Skilled Nursing Facility | Attending: Family Medicine | Admitting: Family Medicine

## 2022-03-09 ENCOUNTER — Emergency Department (HOSPITAL_COMMUNITY): Payer: Medicare Other

## 2022-03-09 DIAGNOSIS — I517 Cardiomegaly: Secondary | ICD-10-CM | POA: Diagnosis not present

## 2022-03-09 DIAGNOSIS — N184 Chronic kidney disease, stage 4 (severe): Secondary | ICD-10-CM | POA: Diagnosis not present

## 2022-03-09 DIAGNOSIS — Z681 Body mass index (BMI) 19 or less, adult: Secondary | ICD-10-CM

## 2022-03-09 DIAGNOSIS — Z789 Other specified health status: Secondary | ICD-10-CM | POA: Diagnosis not present

## 2022-03-09 DIAGNOSIS — L89211 Pressure ulcer of right hip, stage 1: Secondary | ICD-10-CM | POA: Diagnosis present

## 2022-03-09 DIAGNOSIS — R6520 Severe sepsis without septic shock: Secondary | ICD-10-CM | POA: Diagnosis not present

## 2022-03-09 DIAGNOSIS — N179 Acute kidney failure, unspecified: Secondary | ICD-10-CM | POA: Diagnosis present

## 2022-03-09 DIAGNOSIS — D539 Nutritional anemia, unspecified: Secondary | ICD-10-CM | POA: Diagnosis present

## 2022-03-09 DIAGNOSIS — I71019 Dissection of thoracic aorta, unspecified: Secondary | ICD-10-CM | POA: Diagnosis not present

## 2022-03-09 DIAGNOSIS — R633 Feeding difficulties, unspecified: Secondary | ICD-10-CM | POA: Diagnosis not present

## 2022-03-09 DIAGNOSIS — M7989 Other specified soft tissue disorders: Secondary | ICD-10-CM | POA: Diagnosis not present

## 2022-03-09 DIAGNOSIS — R6510 Systemic inflammatory response syndrome (SIRS) of non-infectious origin without acute organ dysfunction: Secondary | ICD-10-CM | POA: Diagnosis not present

## 2022-03-09 DIAGNOSIS — R4182 Altered mental status, unspecified: Secondary | ICD-10-CM | POA: Diagnosis present

## 2022-03-09 DIAGNOSIS — R54 Age-related physical debility: Secondary | ICD-10-CM | POA: Diagnosis not present

## 2022-03-09 DIAGNOSIS — Z515 Encounter for palliative care: Secondary | ICD-10-CM | POA: Diagnosis not present

## 2022-03-09 DIAGNOSIS — E43 Unspecified severe protein-calorie malnutrition: Secondary | ICD-10-CM | POA: Diagnosis present

## 2022-03-09 DIAGNOSIS — I129 Hypertensive chronic kidney disease with stage 1 through stage 4 chronic kidney disease, or unspecified chronic kidney disease: Secondary | ICD-10-CM | POA: Diagnosis present

## 2022-03-09 DIAGNOSIS — R918 Other nonspecific abnormal finding of lung field: Secondary | ICD-10-CM | POA: Diagnosis not present

## 2022-03-09 DIAGNOSIS — Z1152 Encounter for screening for COVID-19: Secondary | ICD-10-CM

## 2022-03-09 DIAGNOSIS — R64 Cachexia: Secondary | ICD-10-CM | POA: Diagnosis present

## 2022-03-09 DIAGNOSIS — I10 Essential (primary) hypertension: Secondary | ICD-10-CM | POA: Diagnosis not present

## 2022-03-09 DIAGNOSIS — E870 Hyperosmolality and hypernatremia: Principal | ICD-10-CM | POA: Diagnosis present

## 2022-03-09 DIAGNOSIS — G928 Other toxic encephalopathy: Secondary | ICD-10-CM | POA: Diagnosis present

## 2022-03-09 DIAGNOSIS — L8990 Pressure ulcer of unspecified site, unspecified stage: Secondary | ICD-10-CM | POA: Diagnosis present

## 2022-03-09 DIAGNOSIS — Z9842 Cataract extraction status, left eye: Secondary | ICD-10-CM

## 2022-03-09 DIAGNOSIS — R0902 Hypoxemia: Secondary | ICD-10-CM | POA: Diagnosis not present

## 2022-03-09 DIAGNOSIS — S43004A Unspecified dislocation of right shoulder joint, initial encounter: Secondary | ICD-10-CM | POA: Diagnosis not present

## 2022-03-09 DIAGNOSIS — R0689 Other abnormalities of breathing: Secondary | ICD-10-CM | POA: Diagnosis not present

## 2022-03-09 DIAGNOSIS — R509 Fever, unspecified: Secondary | ICD-10-CM | POA: Diagnosis not present

## 2022-03-09 DIAGNOSIS — E860 Dehydration: Secondary | ICD-10-CM | POA: Diagnosis present

## 2022-03-09 DIAGNOSIS — E1122 Type 2 diabetes mellitus with diabetic chronic kidney disease: Secondary | ICD-10-CM | POA: Diagnosis not present

## 2022-03-09 DIAGNOSIS — I472 Ventricular tachycardia, unspecified: Secondary | ICD-10-CM | POA: Diagnosis not present

## 2022-03-09 DIAGNOSIS — Z79899 Other long term (current) drug therapy: Secondary | ICD-10-CM

## 2022-03-09 DIAGNOSIS — D649 Anemia, unspecified: Secondary | ICD-10-CM | POA: Diagnosis not present

## 2022-03-09 DIAGNOSIS — E7800 Pure hypercholesterolemia, unspecified: Secondary | ICD-10-CM | POA: Diagnosis present

## 2022-03-09 DIAGNOSIS — R627 Adult failure to thrive: Secondary | ICD-10-CM | POA: Diagnosis present

## 2022-03-09 DIAGNOSIS — H548 Legal blindness, as defined in USA: Secondary | ICD-10-CM | POA: Diagnosis present

## 2022-03-09 DIAGNOSIS — E785 Hyperlipidemia, unspecified: Secondary | ICD-10-CM | POA: Diagnosis not present

## 2022-03-09 DIAGNOSIS — R404 Transient alteration of awareness: Secondary | ICD-10-CM | POA: Diagnosis not present

## 2022-03-09 DIAGNOSIS — H905 Unspecified sensorineural hearing loss: Secondary | ICD-10-CM | POA: Diagnosis present

## 2022-03-09 DIAGNOSIS — Z993 Dependence on wheelchair: Secondary | ICD-10-CM

## 2022-03-09 DIAGNOSIS — Z66 Do not resuscitate: Secondary | ICD-10-CM | POA: Diagnosis present

## 2022-03-09 DIAGNOSIS — R58 Hemorrhage, not elsewhere classified: Secondary | ICD-10-CM | POA: Diagnosis not present

## 2022-03-09 DIAGNOSIS — G20A1 Parkinson's disease without dyskinesia, without mention of fluctuations: Secondary | ICD-10-CM | POA: Diagnosis not present

## 2022-03-09 DIAGNOSIS — Z961 Presence of intraocular lens: Secondary | ICD-10-CM | POA: Diagnosis present

## 2022-03-09 DIAGNOSIS — Z8616 Personal history of COVID-19: Secondary | ICD-10-CM | POA: Diagnosis not present

## 2022-03-09 DIAGNOSIS — J90 Pleural effusion, not elsewhere classified: Secondary | ICD-10-CM | POA: Diagnosis not present

## 2022-03-09 DIAGNOSIS — R638 Other symptoms and signs concerning food and fluid intake: Secondary | ICD-10-CM | POA: Diagnosis not present

## 2022-03-09 DIAGNOSIS — F01511 Vascular dementia, unspecified severity, with agitation: Secondary | ICD-10-CM | POA: Diagnosis not present

## 2022-03-09 DIAGNOSIS — E118 Type 2 diabetes mellitus with unspecified complications: Secondary | ICD-10-CM | POA: Diagnosis not present

## 2022-03-09 DIAGNOSIS — I679 Cerebrovascular disease, unspecified: Secondary | ICD-10-CM | POA: Diagnosis not present

## 2022-03-09 DIAGNOSIS — F02C11 Dementia in other diseases classified elsewhere, severe, with agitation: Secondary | ICD-10-CM | POA: Diagnosis not present

## 2022-03-09 DIAGNOSIS — J189 Pneumonia, unspecified organism: Secondary | ICD-10-CM | POA: Diagnosis not present

## 2022-03-09 DIAGNOSIS — Z711 Person with feared health complaint in whom no diagnosis is made: Secondary | ICD-10-CM | POA: Diagnosis not present

## 2022-03-09 DIAGNOSIS — S43005A Unspecified dislocation of left shoulder joint, initial encounter: Secondary | ICD-10-CM | POA: Diagnosis not present

## 2022-03-09 DIAGNOSIS — A419 Sepsis, unspecified organism: Secondary | ICD-10-CM | POA: Diagnosis not present

## 2022-03-09 DIAGNOSIS — Z7401 Bed confinement status: Secondary | ICD-10-CM | POA: Diagnosis not present

## 2022-03-09 DIAGNOSIS — R5383 Other fatigue: Secondary | ICD-10-CM | POA: Diagnosis present

## 2022-03-09 DIAGNOSIS — M1990 Unspecified osteoarthritis, unspecified site: Secondary | ICD-10-CM | POA: Diagnosis not present

## 2022-03-09 DIAGNOSIS — Z87891 Personal history of nicotine dependence: Secondary | ICD-10-CM

## 2022-03-09 DIAGNOSIS — D500 Iron deficiency anemia secondary to blood loss (chronic): Secondary | ICD-10-CM | POA: Diagnosis not present

## 2022-03-09 DIAGNOSIS — B9562 Methicillin resistant Staphylococcus aureus infection as the cause of diseases classified elsewhere: Secondary | ICD-10-CM | POA: Diagnosis present

## 2022-03-09 DIAGNOSIS — F39 Unspecified mood [affective] disorder: Secondary | ICD-10-CM | POA: Diagnosis not present

## 2022-03-09 DIAGNOSIS — J690 Pneumonitis due to inhalation of food and vomit: Secondary | ICD-10-CM | POA: Diagnosis not present

## 2022-03-09 DIAGNOSIS — E861 Hypovolemia: Secondary | ICD-10-CM | POA: Diagnosis present

## 2022-03-09 DIAGNOSIS — Z7189 Other specified counseling: Secondary | ICD-10-CM

## 2022-03-09 DIAGNOSIS — T68XXXA Hypothermia, initial encounter: Secondary | ICD-10-CM | POA: Diagnosis not present

## 2022-03-09 DIAGNOSIS — R000 Tachycardia, unspecified: Secondary | ICD-10-CM | POA: Diagnosis not present

## 2022-03-09 DIAGNOSIS — G2409 Other drug induced dystonia: Secondary | ICD-10-CM | POA: Diagnosis not present

## 2022-03-09 DIAGNOSIS — J984 Other disorders of lung: Secondary | ICD-10-CM | POA: Diagnosis not present

## 2022-03-09 LAB — COMPREHENSIVE METABOLIC PANEL
ALT: 10 U/L (ref 0–44)
AST: 17 U/L (ref 15–41)
Albumin: 2.5 g/dL — ABNORMAL LOW (ref 3.5–5.0)
Alkaline Phosphatase: 77 U/L (ref 38–126)
Anion gap: 11 (ref 5–15)
BUN: 104 mg/dL — ABNORMAL HIGH (ref 8–23)
CO2: 27 mmol/L (ref 22–32)
Calcium: 9.4 mg/dL (ref 8.9–10.3)
Chloride: 124 mmol/L — ABNORMAL HIGH (ref 98–111)
Creatinine, Ser: 4.53 mg/dL — ABNORMAL HIGH (ref 0.44–1.00)
GFR, Estimated: 9 mL/min — ABNORMAL LOW (ref >60)
Glucose, Bld: 97 mg/dL (ref 70–99)
Potassium: 4.4 mmol/L (ref 3.5–5.1)
Sodium: 162 mmol/L (ref 135–145)
Total Bilirubin: 0.6 mg/dL (ref 0.3–1.2)
Total Protein: 7.1 g/dL (ref 6.5–8.1)

## 2022-03-09 LAB — BASIC METABOLIC PANEL
Anion gap: 10 (ref 5–15)
BUN: 98 mg/dL — ABNORMAL HIGH (ref 8–23)
CO2: 24 mmol/L (ref 22–32)
Calcium: 8.8 mg/dL — ABNORMAL LOW (ref 8.9–10.3)
Chloride: 125 mmol/L — ABNORMAL HIGH (ref 98–111)
Creatinine, Ser: 4.17 mg/dL — ABNORMAL HIGH (ref 0.44–1.00)
GFR, Estimated: 10 mL/min — ABNORMAL LOW (ref >60)
Glucose, Bld: 127 mg/dL — ABNORMAL HIGH (ref 70–99)
Potassium: 4.4 mmol/L (ref 3.5–5.1)
Sodium: 159 mmol/L — ABNORMAL HIGH (ref 135–145)

## 2022-03-09 LAB — CBC WITH DIFFERENTIAL/PLATELET
Abs Immature Granulocytes: 0.04 10*3/uL (ref 0.00–0.07)
Basophils Absolute: 0 10*3/uL (ref 0.0–0.1)
Basophils Relative: 0 %
Eosinophils Absolute: 0 10*3/uL (ref 0.0–0.5)
Eosinophils Relative: 0 %
HCT: 37.1 % (ref 36.0–46.0)
Hemoglobin: 11.2 g/dL — ABNORMAL LOW (ref 12.0–15.0)
Immature Granulocytes: 0 %
Lymphocytes Relative: 17 %
Lymphs Abs: 1.6 10*3/uL (ref 0.7–4.0)
MCH: 34.3 pg — ABNORMAL HIGH (ref 26.0–34.0)
MCHC: 30.2 g/dL (ref 30.0–36.0)
MCV: 113.5 fL — ABNORMAL HIGH (ref 80.0–100.0)
Monocytes Absolute: 0.6 10*3/uL (ref 0.1–1.0)
Monocytes Relative: 7 %
Neutro Abs: 7.1 10*3/uL (ref 1.7–7.7)
Neutrophils Relative %: 76 %
Platelets: 229 10*3/uL (ref 150–400)
RBC: 3.27 MIL/uL — ABNORMAL LOW (ref 3.87–5.11)
RDW: 14.3 % (ref 11.5–15.5)
WBC: 9.4 10*3/uL (ref 4.0–10.5)
nRBC: 0 % (ref 0.0–0.2)

## 2022-03-09 LAB — URINALYSIS, ROUTINE W REFLEX MICROSCOPIC
Bacteria, UA: NONE SEEN
Bilirubin Urine: NEGATIVE
Glucose, UA: NEGATIVE mg/dL
Hgb urine dipstick: NEGATIVE
Ketones, ur: NEGATIVE mg/dL
Leukocytes,Ua: NEGATIVE
Nitrite: NEGATIVE
Protein, ur: NEGATIVE mg/dL
Specific Gravity, Urine: 1.01 (ref 1.005–1.030)
pH: 5 (ref 5.0–8.0)

## 2022-03-09 LAB — APTT: aPTT: 31 seconds (ref 24–36)

## 2022-03-09 LAB — LACTIC ACID, PLASMA
Lactic Acid, Venous: 1.2 mmol/L (ref 0.5–1.9)
Lactic Acid, Venous: 1.8 mmol/L (ref 0.5–1.9)

## 2022-03-09 LAB — PROTIME-INR
INR: 1.4 — ABNORMAL HIGH (ref 0.8–1.2)
Prothrombin Time: 16.6 seconds — ABNORMAL HIGH (ref 11.4–15.2)

## 2022-03-09 LAB — BRAIN NATRIURETIC PEPTIDE: B Natriuretic Peptide: 513.9 pg/mL — ABNORMAL HIGH (ref 0.0–100.0)

## 2022-03-09 MED ORDER — DEXTROSE 5 % IV SOLN: Freq: Once | INTRAVENOUS | Status: AC

## 2022-03-09 MED ORDER — SODIUM CHLORIDE 0.9 % IV BOLUS
500.0000 mL | Freq: Once | INTRAVENOUS | Status: AC
  Administered 2022-03-09: 500 mL via INTRAVENOUS

## 2022-03-09 MED ORDER — SODIUM CHLORIDE 0.9 % IV SOLN: INTRAVENOUS | Status: DC

## 2022-03-09 MED ORDER — LORAZEPAM 2 MG/ML IJ SOLN
1.0000 mg | INTRAMUSCULAR | Status: AC | PRN
  Administered 2022-03-09: 1 mg via INTRAVENOUS
  Filled 2022-03-09: qty 1

## 2022-03-09 NOTE — Progress Notes (Signed)
Subjective: CT chest images without contrast reviewed and patient discussed with Dr Rogene Houston  [phone] for coordination of care. The patient may have an abnormality of the descending thoracic aorta distal to the location of the L subclavian, ie intramural hematoma or type B dissection which cannot be confirmed wo contrast.  Because of the patients advanced age, DNR status, poor functional status requiring nursing home placement under hospice care she is not a candidate for aortic surgery.  I agree with the plan to hold iv contrast due to the acute renal failure and treat her multiple medical problems without surgery   Results for orders placed or performed during the hospital encounter of 03/09/22  Lactic acid, plasma  Result Value Ref Range   Lactic Acid, Venous 1.2 0.5 - 1.9 mmol/L  Comprehensive metabolic panel  Result Value Ref Range   Sodium 162 (HH) 135 - 145 mmol/L   Potassium 4.4 3.5 - 5.1 mmol/L   Chloride 124 (H) 98 - 111 mmol/L   CO2 27 22 - 32 mmol/L   Glucose, Bld 97 70 - 99 mg/dL   BUN 104 (H) 8 - 23 mg/dL   Creatinine, Ser 4.53 (H) 0.44 - 1.00 mg/dL   Calcium 9.4 8.9 - 10.3 mg/dL   Total Protein 7.1 6.5 - 8.1 g/dL   Albumin 2.5 (L) 3.5 - 5.0 g/dL   AST 17 15 - 41 U/L   ALT 10 0 - 44 U/L   Alkaline Phosphatase 77 38 - 126 U/L   Total Bilirubin 0.6 0.3 - 1.2 mg/dL   GFR, Estimated 9 (L) >60 mL/min   Anion gap 11 5 - 15  CBC with Differential  Result Value Ref Range   WBC 9.4 4.0 - 10.5 K/uL   RBC 3.27 (L) 3.87 - 5.11 MIL/uL   Hemoglobin 11.2 (L) 12.0 - 15.0 g/dL   HCT 37.1 36.0 - 46.0 %   MCV 113.5 (H) 80.0 - 100.0 fL   MCH 34.3 (H) 26.0 - 34.0 pg   MCHC 30.2 30.0 - 36.0 g/dL   RDW 14.3 11.5 - 15.5 %   Platelets 229 150 - 400 K/uL   nRBC 0.0 0.0 - 0.2 %   Neutrophils Relative % 76 %   Neutro Abs 7.1 1.7 - 7.7 K/uL   Lymphocytes Relative 17 %   Lymphs Abs 1.6 0.7 - 4.0 K/uL   Monocytes Relative 7 %   Monocytes Absolute 0.6 0.1 - 1.0 K/uL    Eosinophils Relative 0 %   Eosinophils Absolute 0.0 0.0 - 0.5 K/uL   Basophils Relative 0 %   Basophils Absolute 0.0 0.0 - 0.1 K/uL   Immature Granulocytes 0 %   Abs Immature Granulocytes 0.04 0.00 - 0.07 K/uL  Protime-INR  Result Value Ref Range   Prothrombin Time 16.6 (H) 11.4 - 15.2 seconds   INR 1.4 (H) 0.8 - 1.2  APTT  Result Value Ref Range   aPTT 31 24 - 36 seconds  Urinalysis, Routine w reflex microscopic Urine, Clean Catch  Result Value Ref Range   Color, Urine YELLOW YELLOW   APPearance CLEAR CLEAR   Specific Gravity, Urine 1.010 1.005 - 1.030   pH 5.0 5.0 - 8.0   Glucose, UA NEGATIVE NEGATIVE mg/dL   Hgb urine dipstick NEGATIVE NEGATIVE   Bilirubin Urine NEGATIVE NEGATIVE   Ketones, ur NEGATIVE NEGATIVE mg/dL   Protein, ur NEGATIVE NEGATIVE mg/dL   Nitrite NEGATIVE NEGATIVE   Leukocytes,Ua NEGATIVE NEGATIVE   RBC / HPF 0-5 0 -  5 RBC/hpf   WBC, UA 0-5 0 - 5 WBC/hpf   Bacteria, UA NONE SEEN NONE SEEN   Squamous Epithelial / LPF 0-5 0 - 5  Brain natriuretic peptide  Result Value Ref Range   B Natriuretic Peptide 513.9 (H) 0.0 - 100.0 pg/mL   Cannot be  Objective: Vital signs in last 24 hours: Temp:  [98.8 F (37.1 C)-100 F (37.8 C)] 98.8 F (37.1 C) (11/15 1954) Pulse Rate:  [57-110] 88 (11/15 2000) Cardiac Rhythm: Normal sinus rhythm (11/15 1550) Resp:  [16-35] 16 (11/15 2000) BP: (97-150)/(63-131) 123/79 (11/15 2000) SpO2:  [91 %-100 %] 96 % (11/15 2000) Weight:  [40.3 kg] 40.3 kg (11/15 1537)  Hemodynamic parameters for last 24 hours:    Intake/Output from previous day: No intake/output data recorded. Intake/Output this shift: No intake/output data recorded.    Lab Results: Recent Labs    03/09/22 1645  WBC 9.4  HGB 11.2*  HCT 37.1  PLT 229   BMET:  Recent Labs    03/09/22 1645  NA 162*  K 4.4  CL 124*  CO2 27  GLUCOSE 97  BUN 104*  CREATININE 4.53*  CALCIUM 9.4    PT/INR:  Recent Labs    03/09/22 1645  LABPROT 16.6*   INR 1.4*   ABG    Component Value Date/Time   TCO2 25 04/08/2008 2324   CBG (last 3)  No results for input(s): "GLUCAP" in the last 72 hours.  Assessment/Plan: S/P  Patient not a surgical candidate for aortic disease- recommend blood pressure control,    LOS: 0 days    [name] 03/09/2022
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
## 2022-03-09 NOTE — Assessment & Plan Note (Addendum)
Several noted pressure injuries on the skin prior to admission.  Pictures are documented in the chart.  Appears to be stage I pressure ulcer on the right hip - Continue wound care - Has air mattress  - Frequent repositioning

## 2022-03-09 NOTE — Assessment & Plan Note (Deleted)
Patient was recently treated for pneumonia 11/10 with doxycycline. Currently afebrile without tachypnea or hypoxia. CT shows some signs of possible pneumonia, but most likely clinically resolved.  - Hold doxycycline due to patient being afebrile - If febrile will start broad-spectrum antibiotics Vanc and cefepime - Follow-up blood cultures - Hold rectal Tylenol - F/u COVID and flu swab

## 2022-03-09 NOTE — ED Triage Notes (Signed)
EMS dispatched for abnormal lab value. Upon EMS arrival pt found to be febrile and tachycardic, diagnosed with pneumonia on 11/10. Pt blind, deaf, and nonverbal. VSS at this time.

## 2022-03-09 NOTE — Hospital Course (Addendum)
[name] is a 88 y.o. female presenting with hypernatremia. Past medical history significant for congenital deafness, blindness, hyperlipidemia, hypertension, CKD stage IV, diabetes.  Patient is currently on hospice at [facility].  Her hospital course is outlined below:  Hypernatremia: On routine labs of SNF patient noted to have abnormal labs.  On presentation to the ED her sodium was 162.  She was started on normal saline IV fluid, CCM was consulted and recommended started D5 maintenance fluids.  Later she was continued with lactated Ringer's.  Sodium was not reduced more than 12 meq in 24 hours. She restarted feeding on 11/20. She was corrected slowly over 5 days and at discharge her sodium was 141. She was able to maintain her sodium without fluids.   AKI: On admission her creatinine was noted to be 4.5.  Her baseline creatinine appears to be less than 2.  She was started on IV fluids which improved her AKI. She was able to maintain po intake off of IVF. Her creatinine at discharge was 2.45.  Goals of Care  Talked to patient's son extensively regarding goals of care given hypernatremia from most likely dehydration and low po intake, chronic aspiration pneumonitis. Patient's son who is poa decided to continue feeds, discontinue IVF and antibiotics. She was feeding and eating most of her meals by time of discharge.  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  Incidentally found on ED chest CT.  Cardiothoracic surgery was consulted and recommended close blood pressure control but declined surgical intervention as patient is not a good candidate. Monitored blood pressure closely during admission with goal of within 140/90.

## 2022-03-09 NOTE — Assessment & Plan Note (Addendum)
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  - Cardiothoracic surgery recommendations, no intervention if patient is hospice  - Monitor blood pressures, prn hydral to maintain bp in range

## 2022-03-09 NOTE — ED Notes (Signed)
Attempted to insert foley but was unsuccessful, pt did begin urinating upon attempt. Received urinalysis and urine culture.

## 2022-03-09 NOTE — H&P (Cosign Needed Addendum)
Hospital Admission History and Physical Service Pager: [phone]  Patient name: [name] record number: [id number] Date of Birth: 12/26/1932 Age: 88 y.o. Gender: female  Primary Care Provider: Lynnell Catalan, FNP Consultants: Cardiothoracic surgery Code Status: DNI Preferred Emergency Contact:  Contact Information     Name Relation Home Work [address]   [name] [phone]  [phone]      Chief Complaint: Abnormal labs at SNF  Assessment and Plan: [name] is a 88 y.o. female presenting with abnormal labs. Differential for this patient's presentation of this includes hypernatremia due to dehydration which is most likely due to the patient's poor p.o. intake.  Also includes possible sepsis due to recent pneumonia.  * Hypernatremia Na 162.  CCM consulted in the ED which thought her hypernatremia was due to dehydration.  Her creatinine is 4.5 which is elevated from her baseline which looks to be less than 2.  She is status post 1.5 L bolus NS.  Per CCM recommendations, she was started on D5 maintenance fluids. - Admit to FMTS, attending Dr. Owens Shark - Progressive, vital signs per floor - Continue D5 maintenance fluids - BMP scheduled every 6 hours - Fall precautions - Bladder scans for urinary retention q4h - Convert to NS if sodium corrects to <150 too rapidly (in <24h) - Continuous cardiac telemetry  - Delirium precautions  Pneumonia Patient was recently treated for pneumonia 11/10 with doxycycline.  Patient was febrile according to EMS but has been afebrile since being in the ED.  We will continue to monitor for fever curve.  UA negative for infection. - Hold doxycycline due to patient being afebrile - If febrile will start broad-spectrum antibiotics Vanc and cefepime - Follow-up blood cultures - Hold rectal Tylenol - COVID and flu swab ordered  Thoracic aortic dissection ([name]) Per CT chest obtained in the ED, appears to be a thoracic  aortic dissection.  Radiologist recommended further evaluation with CT angiogram.  ED doctor reached out to cardiothoracic surgery in the ED but  anticipate that we will pursue medical management. Reassuringly blood pressure is normotensive. - Cardiothoracic surgery recommendations, as patient is hospice doubt there will be any surgical intervention - Continue goals of care discussion - Monitor blood pressures  Goals of care, counseling/discussion Patient is currently DNI per son.  She is extremely cachectic nonverbal, blind, deaf with severe dementia documented previously.  She is currently on hospice care with morphine. - Continue goals of care discussion as son continue to wants ACS cardiac resuscitation - Palliative consult in the a.m.  AKI (acute kidney injury) ([name]) Creatinine 4.5 on admission, baseline Cr is <2.  This is most likely prerenal as she is dehydrated.  S/p 1.5 L NS. Per MAR, takes torsemide daily for an unknown reason, which is given daily even with poor oral intake, likely contributing to clinical picture. - Continue D5 maintenance fluids at 75 mL/h - Trend BMP every 6 hours - Avoid nephrotoxic agents as able - Hold home torsemide  Pressure injury of skin Several noted pressure injuries on the skin prior to admission.  Pictures are documented in the chart.  Appears to be stage I pressure ulcer on the right hip, bandages applied on 11/15 on both of her shins. Unable to visualize wound on left second toe as not tolerating wound being unwrapped.  - Continue wound care - Consider air mattress - Evaluate toe wound once dressing removed   Chronic conditions HTN: normotensive, holding home clonidine Anemia:  holding home iron supplement Dementia: holding mirtazapine and benztropine while NPO, add back when able  Sleeping issues: holding home trazodone as NPO and AMS Unknown: taking torsemide per [name] for unknown reason, will hold due to dehydration and AKI  FEN/GI: N.p.o.  (once able, will need diet with pureed foods, thickened liquids, and crushed meds in applesauce) VTE Prophylaxis: SCDs  Disposition: Progressive  History of Present Illness:  [name] is a 88 y.o. female presenting from [facility] for abnormal lab value values concerning for dehydration with elevated BUN and creatinine.  History collected from SNF nurse and patient's son (via ER report).   Upon EMS on arrival to SNF found patient to be febrile and tachycardic.  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 On 11/10 she was diagnosed with pneumonia and started on doxycycline.  She has had no increased work of breathing.  [name] and spoke with SNF RN.  Over the last several months she has been declining.  Previously she was able to communicate with sign language and speak 1-2 words but over the last few months now mainly communicates via shaking her head.  She is now on hospice and has been receiving morphine a few times a week but has not required in the last 2 weeks.  Over the last week she has become more lethargic and has had a decreased appetite. Of note she has been having tremors in the last week and a blister that opened on her right toe without signs of infection.  Normally she is able to take thickened liquids, crushed meds, and requires assistance with feeds -generally eating 55 to 65% of her meals.    In the ED, patient was afebrile and did not meet sepsis criteria.  Labs were significant for: Na 162, creatinine 4.53-baseline appears to be around 2, BNP 513.9.  Patient received 1.5 L normal saline in the ED.  CCM was consulted in the ED for the hypernatremia and recommended switching NS to D5 and felt labs were secondary to dehydration.  CT chest showed concerns for possible thoracic aortic dissection.  ED doctor consulted cardiothoracic surgery in the ED.  Admitted to FMTS for close lab monitoring and fluid administration.  Review Of  Systems: Per HPI with the following additions: No difficulty breathing, worsening mental status per SNF RN  Pertinent Past Medical History: Hypertension, hyperlipidemia, diabetes, congenitally deaf, CKD stage IV, cataracts Remainder reviewed in history tab.   Pertinent Past Surgical History: Cataract surgery Remainder reviewed in history tab.   Pertinent Social History: Tobacco use: Former Alcohol use: Not currently Other Substance use: Denies Lives at [facility]. Non-ambulatory at baseline, deaf, and minimally communicative   Pertinent Family History: No pertinent family history known, patient nonverbal. Remainder reviewed in history tab.   Important Outpatient Medications: (all meds crushed and given in applesauce) Cogentin 0.5mg  BID Clonidine 0.2mg  daily PRN for HTN Mirtazapine 7.5mg  QHS Senna PRN Morphine 0.55mL of 20mg /mL solution q4h PRN  Torsemide 10mg  daily Trazodone 75mg  daily Remainder reviewed in medication history.   Objective: BP 123/79   Pulse 88   Temp 98.8 F (37.1 C) (Oral)   Resp 16   Ht 5\' 2"  (1.575 m)   Wt 40.3 kg   SpO2 96%   BMI 16.25 kg/m  Exam: General: Cachectic, chronically ill-appearing, nonverbal, agitated, nonambulatory Eyes: Closed, unable to open, pressure injury on right orbit with picture in chart Cardiovascular: Regular rate, regular rhythm, no murmurs on exam Respiratory: No  increased work of breathing, no crackles or rhonchi Gastrointestinal: Cachectic, normal bowel sounds MSK: Atrophied, sporadically will move limbs Derm: Pressure injury of the right hip stage I, covered wounds on b/l shins with dressings CDI, Left second toe with dressing that is adhered to skin Neuro: Agitated, responds to stimuli (especially noxious), unable to follow any commands due to deafness and altered mental status  Labs:  CBC BMET  Recent Labs  Lab 03/09/22 1645  WBC 9.4  HGB 11.2*  HCT 37.1  PLT 229   Recent Labs  Lab  03/09/22 1645  NA 162*  K 4.4  CL 124*  CO2 27  BUN 104*  CREATININE 4.53*  GLUCOSE 97  CALCIUM 9.4    BNP 513  EKG: Sinus tachycardia, left ventricular hypertrophy, no ST elevations or depressions   Imaging Studies Performed:  CXR: Cardiomegaly. Linear densities in left lower lung fields may suggest subsegmental atelectasis or early pneumonia. Part of this finding may be due to elevation of left hemidiaphragm.   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 There are lucencies in proximal left humerus and left scapula which may be related to chronic dysplasia or neoplastic process such as metastatic disease. If clinically warranted, follow-up radionuclide bone scan or MRI may be considered.  CT head without contrast: No acute intracranial pathology Moderate age-related atrophy and chronic microvascular ischemic changes  CT chest without contrast 1. Thin crescentic hyperdensity along the course of the thoracic aorta extending from approximately the level of the distal arch to at least the aortic hiatus. Appearance suggests an intramural hematoma. Cardiothoracic consultation and further evaluation with CT angiogram is recommended to assess for aortic dissection. 2. Trace left pleural effusion with minimal dependent airspace consolidation, which may represent atelectasis or pneumonia. 3. Severe degenerative changes of both glenohumeral joints with extensive osseous remodeling. There are moderate-large bilateral glenohumeral joint effusions, nonspecific. 4. 5 mm subpleural nodule at the right lung base. No follow-up needed if patient is low-risk.This recommendation follows the consensus statement: Guidelines for Management of Incidental Pulmonary Nodules Detected on CT Images: From the Fleischner Society 2017; Radiology 2017; 284:228-243. 5. Aortic and coronary artery atherosclerosis (ICD10-I70.0).  Darci Current, DO 03/09/2022, 9:46 PM PGY-1, [facility] Intern pager: [phone], text pages welcome Secure chat group [place] Upper-Level Resident Addendum   I have independently interviewed and examined the patient. I have discussed the above with the original author and agree with their documentation. My edits for correction/addition/clarification are in within the document. Please see also any attending notes.   Rise Patience, DO  PGY-3, [facility] 03/09/2022 10:03 PM  [place] Service pager: [phone] (text pages welcome through [facility])

## 2022-03-09 NOTE — Assessment & Plan Note (Addendum)
Patient is currently DNI per son.  She is extremely cachectic nonverbal, blind, deaf with severe dementia documented previously.  She is currently on hospice care ([facility]) with morphine. - Palliative team has been consulted  - Son's goal is to fix hypernatremia and get patient to start eating again so she can return to [place] difficulty with feeding with mental status as it is currently

## 2022-03-09 NOTE — Assessment & Plan Note (Addendum)
Prerenal AKI in the setting of dehydration.  Kidney function improving IVF.  Last creatinine was 3.82 this morning. -Nephrology following, appreciate recs - Continue D5 at 125 mL/hr - Trend BMP every 6 hours - Avoid nephrotoxic agents as able - Hold home torsemide

## 2022-03-09 NOTE — Assessment & Plan Note (Addendum)
Resolved. Off of IVF. Continue to monitor through today to see if she can maintain Na off IVF.  -Nephrology following, appreciate recs - BID BMP today and BMP tomorrow AM  - Monitor mental status, delirium precautions

## 2022-03-10 ENCOUNTER — Encounter (HOSPITAL_COMMUNITY): Payer: Self-pay | Admitting: Student

## 2022-03-10 ENCOUNTER — Inpatient Hospital Stay (HOSPITAL_COMMUNITY): Payer: Medicare Other

## 2022-03-10 DIAGNOSIS — R54 Age-related physical debility: Secondary | ICD-10-CM

## 2022-03-10 DIAGNOSIS — Z515 Encounter for palliative care: Secondary | ICD-10-CM

## 2022-03-10 DIAGNOSIS — I10 Essential (primary) hypertension: Secondary | ICD-10-CM | POA: Diagnosis present

## 2022-03-10 DIAGNOSIS — R638 Other symptoms and signs concerning food and fluid intake: Secondary | ICD-10-CM

## 2022-03-10 DIAGNOSIS — R4182 Altered mental status, unspecified: Secondary | ICD-10-CM | POA: Diagnosis not present

## 2022-03-10 DIAGNOSIS — N179 Acute kidney failure, unspecified: Secondary | ICD-10-CM

## 2022-03-10 DIAGNOSIS — Z7189 Other specified counseling: Secondary | ICD-10-CM

## 2022-03-10 DIAGNOSIS — E870 Hyperosmolality and hypernatremia: Principal | ICD-10-CM

## 2022-03-10 DIAGNOSIS — Z789 Other specified health status: Secondary | ICD-10-CM

## 2022-03-10 DIAGNOSIS — Z711 Person with feared health complaint in whom no diagnosis is made: Secondary | ICD-10-CM

## 2022-03-10 LAB — BASIC METABOLIC PANEL
Anion gap: 12 (ref 5–15)
Anion gap: 9 (ref 5–15)
Anion gap: 12 (ref 5–15)
Anion gap: 12 (ref 5–15)
BUN: 104 mg/dL — ABNORMAL HIGH (ref 8–23)
BUN: 107 mg/dL — ABNORMAL HIGH (ref 8–23)
BUN: 106 mg/dL — ABNORMAL HIGH (ref 8–23)
BUN: 108 mg/dL — ABNORMAL HIGH (ref 8–23)
CO2: 23 mmol/L (ref 22–32)
CO2: 25 mmol/L (ref 22–32)
CO2: 23 mmol/L (ref 22–32)
CO2: 22 mmol/L (ref 22–32)
Calcium: 9.3 mg/dL (ref 8.9–10.3)
Calcium: 9.2 mg/dL (ref 8.9–10.3)
Calcium: 9.4 mg/dL (ref 8.9–10.3)
Calcium: 9.2 mg/dL (ref 8.9–10.3)
Chloride: 120 mmol/L — ABNORMAL HIGH (ref 98–111)
Chloride: 128 mmol/L — ABNORMAL HIGH (ref 98–111)
Chloride: 125 mmol/L — ABNORMAL HIGH (ref 98–111)
Chloride: 125 mmol/L — ABNORMAL HIGH (ref 98–111)
Creatinine, Ser: 4.35 mg/dL — ABNORMAL HIGH (ref 0.44–1.00)
Creatinine, Ser: 4.38 mg/dL — ABNORMAL HIGH (ref 0.44–1.00)
Creatinine, Ser: 4.4 mg/dL — ABNORMAL HIGH (ref 0.44–1.00)
Creatinine, Ser: 4.29 mg/dL — ABNORMAL HIGH (ref 0.44–1.00)
GFR, Estimated: 9 mL/min — ABNORMAL LOW (ref >60)
GFR, Estimated: 9 mL/min — ABNORMAL LOW (ref >60)
GFR, Estimated: 9 mL/min — ABNORMAL LOW (ref >60)
GFR, Estimated: 9 mL/min — ABNORMAL LOW (ref >60)
Glucose, Bld: 138 mg/dL — ABNORMAL HIGH (ref 70–99)
Glucose, Bld: 104 mg/dL — ABNORMAL HIGH (ref 70–99)
Glucose, Bld: 101 mg/dL — ABNORMAL HIGH (ref 70–99)
Glucose, Bld: 106 mg/dL — ABNORMAL HIGH (ref 70–99)
Potassium: 4.1 mmol/L (ref 3.5–5.1)
Potassium: 4.3 mmol/L (ref 3.5–5.1)
Potassium: 4.1 mmol/L (ref 3.5–5.1)
Potassium: 3.9 mmol/L (ref 3.5–5.1)
Sodium: 155 mmol/L — ABNORMAL HIGH (ref 135–145)
Sodium: 162 mmol/L (ref 135–145)
Sodium: 160 mmol/L — ABNORMAL HIGH (ref 135–145)
Sodium: 159 mmol/L — ABNORMAL HIGH (ref 135–145)

## 2022-03-10 LAB — I-STAT ARTERIAL BLOOD GAS, ED
Acid-Base Excess: 1 mmol/L (ref 0.0–2.0)
Bicarbonate: 25.8 mmol/L (ref 20.0–28.0)
Calcium, Ion: 1.28 mmol/L (ref 1.15–1.40)
HCT: 30 % — ABNORMAL LOW (ref 36.0–46.0)
Hemoglobin: 10.2 g/dL — ABNORMAL LOW (ref 12.0–15.0)
O2 Saturation: 91 %
Potassium: 3.9 mmol/L (ref 3.5–5.1)
Sodium: 160 mmol/L — ABNORMAL HIGH (ref 135–145)
TCO2: 27 mmol/L (ref 22–32)
pCO2 arterial: 38.8 mmHg (ref 32–48)
pH, Arterial: 7.431 (ref 7.35–7.45)
pO2, Arterial: 60 mmHg — ABNORMAL LOW (ref 83–108)

## 2022-03-10 LAB — CBC
HCT: 37.3 % (ref 36.0–46.0)
Hemoglobin: 11 g/dL — ABNORMAL LOW (ref 12.0–15.0)
MCH: 33.7 pg (ref 26.0–34.0)
MCHC: 29.5 g/dL — ABNORMAL LOW (ref 30.0–36.0)
MCV: 114.4 fL — ABNORMAL HIGH (ref 80.0–100.0)
Platelets: 223 10*3/uL (ref 150–400)
RBC: 3.26 MIL/uL — ABNORMAL LOW (ref 3.87–5.11)
RDW: 14 % (ref 11.5–15.5)
WBC: 9.8 10*3/uL (ref 4.0–10.5)
nRBC: 0 % (ref 0.0–0.2)

## 2022-03-10 LAB — TSH: TSH: 0.687 u[IU]/mL (ref 0.350–4.500)

## 2022-03-10 LAB — OSMOLALITY: Osmolality: 367 mOsm/kg (ref 275–295)

## 2022-03-10 LAB — RESP PANEL BY RT-PCR (FLU A&B, COVID) ARPGX2
Influenza A by PCR: NEGATIVE
Influenza B by PCR: NEGATIVE
SARS Coronavirus 2 by RT PCR: NEGATIVE

## 2022-03-10 LAB — MAGNESIUM: Magnesium: 2.7 mg/dL — ABNORMAL HIGH (ref 1.7–2.4)

## 2022-03-10 MED ORDER — HEPARIN SODIUM (PORCINE) 5000 UNIT/ML IJ SOLN
5000.0000 [IU] | Freq: Three times a day (TID) | INTRAMUSCULAR | Status: DC
  Administered 2022-03-10 – 2022-03-16 (×17): 5000 [IU] via SUBCUTANEOUS
  Filled 2022-03-10 (×17): qty 1

## 2022-03-10 MED ORDER — MEDIHONEY WOUND/BURN DRESSING EX PSTE
1.0000 | PASTE | Freq: Every day | CUTANEOUS | Status: DC
  Administered 2022-03-11 – 2022-03-16 (×6): 1 via TOPICAL
  Filled 2022-03-10 (×2): qty 44

## 2022-03-10 MED ORDER — DEXTROSE 5 % IV SOLN: INTRAVENOUS | Status: DC

## 2022-03-10 MED ORDER — SODIUM CHLORIDE 0.9 % IV SOLN: INTRAVENOUS | Status: AC

## 2022-03-10 NOTE — Assessment & Plan Note (Deleted)
Appears improved responds to touch, nods and does some signing. At her baseline according to patient.  -Continue bedrest - Continue NPO status

## 2022-03-10 NOTE — Progress Notes (Signed)
FMTS Brief Progress Note  S: Night rounds.  Patient appears to be asleep in bed, son at bedside.  Initially patient appeared to be no acute distress, however began slowly moving bilateral arms and head.  Son at bedside reported this is her baseline and that she had Parkinson's.  He was very clear that this was not a sign of distress or discomfort in his mother.  He is unable to tell us if she looks more comfortable than before.  He does say several times that he is tired of the goals of care conversations and would simply like to focus on the hyponatremia and lack of eating.  He verbalizes that he believes she will "pull through" this episode and return to her previous baseline at the facility.   O: BP (!) 150/90 (BP Location: Right Arm)   Pulse 100   Temp 98.2 F (36.8 C) (Oral)   Resp 18   Ht 5\' 2"  (1.575 m)   Wt 40.3 kg   SpO2 91%   BMI 16.25 kg/m   General: Laying in bed with eyes closed, cachectic frame, seemingly purposeless movements of head and arms, no vocalizations or grimacing Cardiac: Regular rate and rhythm no murmur Respiratory: Clear anteriorly but unable to cooperate, low lung volumes  A/P: Hypernatremia Last sodium 160.  Next blood draw due 10 PM.  We will adjust D5 rate accordingly.  Goal is to drop no more than 10-12 points in 24 hours, or 0.5/h.  Altered mental status CT head and EEG today without acute findings.  EEG demonstrates moderate diffuse encephalopathy of nonspecific etiology.  No seizures.  Very much likely related to poor p.o. intake in the last 2 weeks electrolyte imbalances.  Hypertension Per RN, BP normal when taken with gentle extension of her arm.  No true hypertensive measurements thus far in last 24 hours.  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   Orders reviewed.    Ezequiel Essex, MD 03/10/2022,  10:13 PM PGY-3, [facility] Night Resident  Please page [phone] with questions.

## 2022-03-10 NOTE — Progress Notes (Addendum)
Daily Progress Note Intern Pager: [phone]  Patient name: [name] record number: [id number] Date of birth: 1933-03-19 Age: 88 y.o. Gender: female  Primary Care Provider: Lynnell Catalan, FNP Consultants: CT Surgery  Code Status: DNI  Pt Overview and Major Events to Date:  Admitted 11/14   Assessment and Plan:  [name] is a 88 y.o. female who presented with hypernatremia and altered mental status. Mental status worsening and continued watcher status.   * Hypernatremia Hypernatremia most likely due to dehydration in the setting of difficulty w/ communication and being in SNF.  - Continue NS through 6 hours of treatment  - Goal correction >6<12 mEq/L per day - BMP every 6 hours  - Monitor mental status, delirium precautions  Altered mental status With worsening altered mental status despite improvement of hypernatremia. Not responding much to noxious stimuli, now tremulous On re-examination this PM, with son, she is improved and near baseline (?) likely due to uremia as well  - CT head without acute finding - EEG   Hypertension Home med: Clonidine  - Holding home meds as patient is altered and cannot take po  - Monitor BP q 4 hours and dose with hydralazine if > 140/90  Recent Pneumonia Treatment, Fever at SNF  Patient was recently treated for pneumonia 11/10 with doxycycline. Currently afebrile without tachypnea or hypoxia. CT shows some signs of possible pneumonia, but most likely clinically resolved.  - If febrile will start broad-spectrum antibiotics Vanc and cefepime - Follow-up blood cultures - Hold rectal Tylenol - Flu and COVID negative   Thoracic aortic dissection ([facility]) Per CT chest obtained in the ED, appears to be a thoracic aortic dissection.   - Cardiothoracic surgery recommendations, no intervention if patient is hospice  - Call son today for [place] conversation and to clarify code status  - Monitor blood pressures  Goals of care,  counseling/discussion Patient is currently DNI per son.  She is extremely cachectic nonverbal, blind, deaf with severe dementia documented previously.  She is currently on hospice care with morphine. - Continue goals of care discussion as son continue to wants ACS cardiac resuscitation - Palliative consult in the a.m.  AKI (acute kidney injury) ([place]) Creatinine 4.5 on admission, baseline Cr is <2. Most likely prerenal given hypernatremia, Nonverbal and communication difficulties in SNF in addition to torsemide.  - Continue NS maintenance fluids at 75 mL/h - Trend BMP every 6 hours - Avoid nephrotoxic agents as able - Hold home torsemide  Pressure injury of skin Several noted pressure injuries on the skin prior to admission.  Pictures are documented in the chart.  Appears to be stage I pressure ulcer on the right hip, bandages applied on 11/15 on both of her shins. wound being unwrapped.  - Continue wound care - order  air mattress        FEN/GI: NS at 50 mL/hr, NPO  PPx: UFH  Dispo:Continue inpatient treatment  Subjective:  Nonverbal and non responsive to tactile stimuli and noxious stimuli   Objective: Temp:  [97.5 F (36.4 C)-100 F (37.8 C)] 99.3 F (37.4 C) (11/16 1205) Pulse Rate:  [50-146] 89 (11/16 1345) Resp:  [12-35] 25 (11/16 1345) BP: (97-156)/(63-131) 139/84 (11/16 1345) SpO2:  [91 %-100 %] 100 % (11/16 1345) Weight:  [40.3 kg] 40.3 kg (11/15 1537) Physical Exam: General: Ill appearing, cachectic Cardiovascular: RRR, pulses equal and regular  Respiratory: No increased effort on room air, CTAB Abdomen: cachectic, soft, non tender to  palpation  Extremities: ulcer on dorsal surface of left foot  Neuro: Not alert or responsive to noxious stimuli near wound, jerks to pinch, PERRLA, posturing in right arm, will have some purposeful movements with left arm, tremulous with shaking of head and arms and legs.   Laboratory: Most recent CBC Lab Results  Component  Value Date   WBC 9.8 03/10/2022   HGB 10.2 (L) 03/10/2022   HCT 30.0 (L) 03/10/2022   MCV 114.4 (H) 03/10/2022   PLT 223 03/10/2022   Most recent BMP    Latest Ref Rng & Units 03/10/2022    1:37 PM  BMP  Glucose 70 - 99 mg/dL 104   BUN 8 - 23 mg/dL 107   Creatinine 0.44 - 1.00 mg/dL 4.38   Sodium 135 - 145 mmol/L 162   Potassium 3.5 - 5.1 mmol/L 4.3   Chloride 98 - 111 mmol/L 128   CO2 22 - 32 mmol/L 25   Calcium 8.9 - 10.3 mg/dL 9.2    Lowry Ram, MD 03/10/2022, 2:59 PM  PGY-1, [place] Intern pager: [phone], text pages welcome Secure chat group [place]

## 2022-03-10 NOTE — Consult Note (Signed)
Consultation Note Date: 03/10/2022   Patient Name: [name]  DOB: 1932/09/29  MRN: [id number]  Age / Sex: 88 y.o., female  PCP: Lynnell Catalan, [place] Referring Physician: Martyn Malay, MD  Reason for Consultation: Establishing goals of care, "To assist with ongoing discussion of goals of care and code status. Concern about DNI status so appreciate discussion with this. Spoke with son, [name], who is agreeable to consult."  HPI/Patient Profile: 88 y.o. female  with past medical history of advanced dementia, hearing impairment, CKD IV, diabetes, and hypertension presented to the ED on 03/09/22 from [place] with abnormal labs. Patient was admitted on 03/09/2022 with hypernatremia secondary to dehydration, pneumonia, thoracic aortic dissection, AKI, pressure injury of skin.   Of note, patient is enrolled in hospice prior to admission.  Clinical Assessment and Goals of Care: I have reviewed medical records including EPIC notes, labs, and imaging. Received report from primary RN - no acute concerns.   Went to visit patient at bedside - son/[name] and daughter/[name] present. Patient was lying in bed - she does not wake to voice/gentle touch. Signs and non-verbal gestures of discomfort noted to include generalized restlessness continuous movements. No respiratory distress, increased work of breathing, or secretions noted.   Met with patient's son and daughter  to discuss diagnosis, prognosis, GOC, EOL wishes, disposition, and options.  I introduced Palliative Medicine as specialized medical care for people living with serious illness. It focuses on providing relief from the symptoms and stress of a serious illness. The goal is to improve quality of life for both the patient and the family.  We discussed a brief life review of the patient as well as functional and nutritional status. Patient is a retired  Regulatory affairs officer. She has two sons and one daughter. Prior to hospitalization, patient was living at [facility], where she has been for about 3-4 years. Patient is deaf and nonverbal. Family tell me that she "doesn't like being in the bed." She was able to get to wheelchair - she enjoyed moving around to "see things." Family recognize she has been declining over the last several months - sleeping more, decreased appetite. Albumin noted at 2.5 on 03/09/22. Family confirm that patient was enrolled in [facility].  We discussed patient's current illness and what it means in the larger context of patient's on-going co-morbidities. Family have a clear understanding of patient's current acute medical situation. Natural disease trajectory and expectations at EOL were discussed. I attempted to elicit values and goals of care important to the patient. The difference between aggressive medical intervention and comfort care was considered in light of the patient's goals of care. Family recognize patient is approaching EOL. [name] is clear that "we are not giving up." Attempted to discuss further, but [name] tells me that "all of these conversations are repetitive. We don't need to keep discussing this." Attempted to provide therapeutic listening and emotional support - validating conversations can be difficult, but necessary.  Attempted to discuss goals around continuing current supportive treatment without escalation of care; however, [name] is clear he does not wish to continue [facility]. He tells me they will make the decisions when needed and will continue discussions with attending team.  Overall, [name] does confirm goal is for patient return to [place] with hospice at discharge.  Questions and concerns were addressed. The patient/family was encouraged to call with questions and/or concerns. PMT card was provided.   Primary Decision Maker: NEXT OF KIN - patient's children    SUMMARY  OF RECOMMENDATIONS    Continue current supportive treatment Continue partial code as DNI as previously documented  Unfortunately, was not able to complete full GOC today as patient's son/[name] does not want to continue discussions around EOL concerns and code status Ultimate goal is for patient to return to [place] with continued hospice support at discharge Patient previously enrolled with [facility] notified to assist in coordinating re-enrollment at discharge PMT will continue to follow peripherally. If there are any imminent needs please call the service directly   Code Status/Advance Care Planning: Limited code  Palliative Prophylaxis:  Aspiration, Bowel Regimen, Frequent Pain Assessment, Oral Care, and Turn Reposition  Additional Recommendations (Limitations, Scope, Preferences): Full Scope Treatment and No Tracheostomy  Psycho-social/Spiritual:  Desire for further Chaplaincy support:no Created space and opportunity for patient and family to express thoughts and feelings regarding patient's current medical situation.  Emotional support and therapeutic listening provided.  Prognosis:  Poor in the setting of advanced age, dementia, FTT, and multiple comorbidities   Discharge Planning: [place] with Hospice      Primary Diagnoses: Present on Admission:  Hypernatremia  Pressure injury of skin  AKI (acute kidney injury) [facility])  Thoracic aortic dissection ([place])  Hypertension   I have reviewed the medical record, interviewed the patient and family, and examined the patient. The following aspects are pertinent.  Past Medical History:  Diagnosis Date   Alcohol abuse    Anemia    Aphasia    Cataract    CKD (chronic kidney disease)    CKD (chronic kidney disease), stage IV ([place])    Deaf    Deaf, bilateral    Dementia ([place])    Dementia ([place])    Diabetes mellitus    Dry eyes    Hypercholesteremia    Hyperlipidemia    Hypertension    Social History    Socioeconomic History   Marital status: Widowed    Spouse name: Not on file   Number of children: 2   Years of education: 24   Highest education level: Not on file  Occupational History    Comment: na  Tobacco Use   Smoking status: Former   Smokeless tobacco: Never  Vaping Use   Vaping Use: Never used  Substance and Sexual Activity   Alcohol use: Not Currently   Drug use: No   Sexual activity: Not on file  Other Topics Concern   Not on file  Social History Narrative   08/26/20 lives at [facility] and [place] from school for [place]   Son is legal guardian, [name]  [phone]   Social Determinants of Health   Financial Resource Strain: Not on file  Food Insecurity: Not on file  Transportation Needs: Not on file  Physical Activity: Not on file  Stress: Not on file  Social Connections: Not on file   Family History  Family history unknown: Yes   Scheduled Meds:  heparin injection (subcutaneous)  5,000 Units Subcutaneous Q8H   leptospermum manuka honey  1 Application Topical Daily   Continuous Infusions: PRN Meds:. Medications Prior to Admission:  Prior to Admission medications   Medication Sig Start Date End Date Taking? Authorizing Provider  acetaminophen (TYLENOL) 650 MG suppository Place 650 mg rectally every 6 (six) hours as needed for mild pain or fever.   Yes [provider]  ALBUTEROL SULFATE IN Take 1 vial by nebulization every other day. 03/08/22 03/17/22 Yes [provider]  benztropine (COGENTIN) 0.5  MG tablet Take 0.5 mg by mouth 2 (two) times daily. 07/16/20  Yes [provider]  Bismuth Tribromoph-Petrolatum (XEROFORM PETROLATUM DRESSING EX) Apply 1 Application topically See admin instructions. Apply to left lower leg topically every day shift every 2 days for wound healing.   Yes [provider]  brimonidine-timolol (COMBIGAN) 0.2-0.5 % ophthalmic solution Place 1 drop into both eyes 2 (two) times  daily.   Yes [provider]  cloNIDine (CATAPRES) 0.2 MG tablet Take 0.2 mg by mouth daily as needed (HTN).   Yes [provider]  diclofenac Sodium (VOLTAREN) 1 % GEL Apply 2 g topically 2 (two) times daily. Left shoulder 08/07/20  Yes [provider]  diphenhydrAMINE (BENADRYL) 25 mg capsule Take 25 mg by mouth every 6 (six) hours as needed for itching or allergies.   Yes [provider]  doxycycline (VIBRA-TABS) 100 MG tablet Take 100 mg by mouth 2 (two) times daily. 03/05/22 03/10/22 Yes [provider]  Emollient (EUCERIN) lotion Apply 1 Application topically See admin instructions. Apply to feet topically every day shift every 2 days for dry feet. Apply to clean feet and cover with socks.   Yes [provider]  ferrous sulfate 325 (65 FE) MG tablet Take 325 mg by mouth daily.   Yes [provider]  hydrocortisone cream 1 % Apply 1 Application topically daily. Apply to face around eyes topically one time a day for periorbital dermatitis for 14 days to cleansed face. 03/08/22 03/21/22 Yes [provider]  Hyoscyamine Sulfate SL 0.125 MG SUBL Place 0.125 mg under the tongue every 4 (four) hours as needed (excess secretions). 07/01/21  Yes [provider]  ILEVRO 0.3 % ophthalmic suspension Place 1 drop into the left eye every other day. 08/02/19  Yes [provider]  Lactobacillus Rhamnosus, GG, (CULTURELLE) CAPS Take 1 capsule by mouth daily. 03/06/22 03/15/22 Yes [provider]  mirtazapine (REMERON) 7.5 MG tablet Take 7.5 mg by mouth at bedtime.   Yes [provider]  morphine (ROXANOL) 20 MG/ML concentrated solution Take 0.25 mLs by mouth every 4 (four) hours as needed for severe pain or shortness of breath.   Yes [provider]  neomycin-bacitracin-polymyxin (NEOSPORIN) [phone] ointment Apply 1 Application topically daily. Apply to 2nd toe on left foot until 03/11/22   Yes  [provider]  olopatadine (PATADAY) 0.1 % ophthalmic solution Place 1 drop into both eyes 2 (two) times daily.   Yes [provider]  OXYGEN Inhale 2 L/min into the lungs every hour as needed (keep sats>90%).   Yes [provider]  sennosides-docusate sodium (SENOKOT-S) 8.6-50 MG tablet Take 2 tablets by mouth daily.   Yes [provider]  silver sulfADIAZINE (SILVADENE) 1 % cream Apply 1 Application topically daily. Apply to face around eyes topically one time a day for periorbital dermatitis for 14 days. Wash face with mild soap/water and blot dry. Thin layer 03/08/22  Yes [provider]  torsemide (DEMADEX) 10 MG tablet Take 10 mg by mouth daily.   Yes [provider]  traZODone (DESYREL) 50 MG tablet Take 75 mg by mouth daily.   Yes [provider]  cloNIDine (CATAPRES) 0.3 MG tablet Take 1 tablet (0.3 mg total) by mouth 2 (two) times daily. (0700) Patient not taking: Reported on 03/09/2022 10/19/19   Patrecia Pour, MD  furosemide (LASIX) 20 MG tablet Take 1 tablet (20 mg total) by mouth daily. Patient not taking: Reported on 03/09/2022 01/05/22  Lajean Saver, MD   No Known Allergies Review of Systems  Unable to perform ROS: Dementia    Physical Exam Vitals and nursing note reviewed.  Constitutional:      General: She is not in acute distress.    Appearance: She is cachectic. She is ill-appearing.  Pulmonary:     Effort: No respiratory distress.  Skin:    General: Skin is warm and dry.  Neurological:     Mental Status: She is unresponsive.     Motor: Weakness present.  Psychiatric:        Speech: She is noncommunicative.     Vital Signs: BP 119/81   Pulse 89   Temp 99.3 F (37.4 C) (Axillary)   Resp 18   Ht _0  (1.575 m)   Wt 40.3 kg   SpO2 92%   BMI 16.25 kg/m  Pain Scale: CPOT       SpO2: SpO2: 92 % O2 Device:SpO2: 92 % O2 Flow Rate: .   IO: Intake/output summary:  Intake/Output Summary  (Last 24 hours) at 03/10/2022 1350 Last data filed at 03/10/2022 4270 Gross per 24 hour  Intake 1000 ml  Output 450 ml  Net 550 ml    LBM: Last BM Date : 03/09/22 Baseline Weight: Weight: 40.3 kg Most recent weight: Weight: 40.3 kg     Palliative Assessment/Data: PPS 10%      Time In: 1200 Time Out: 1330 Time Total: 90 minutes  Greater than 50%  of this time was spent counseling and coordinating care related to the above assessment and plan.  Signed by: Lin Landsman, NP   Please contact Palliative Medicine Team phone at [phone] for questions and concerns.  For individual provider: See Amion  *Portions of this note are a verbal dictation therefore any spelling and/or grammatical errors are due to the "[place] [facility]" system interpretation.

## 2022-03-10 NOTE — Progress Notes (Addendum)
Arrived at patient's bedside to reassess at this evening.  Upon arrival patient was comfortably asleep in bed with son at bedside. Son report the non purposeful  movements are present at baseline for patient and denies any history of seizure. Informed son her EEG didn't show any seizure activity. Patient was laying calm in bed initially when I arrived but woke up during conversation with son. With her awakening she started having the non purposeful movement and son reported she usually get PRN ativan at SNF when she gets restless like this. He confirmed he wants his mom on her home medication she gets at [facility].  Alen Bleacher, MD PGY-2, [facility] Family Medicine Resident  Please page [phone] with questions.

## 2022-03-10 NOTE — ED Notes (Signed)
ED TO INPATIENT HANDOFF REPORT  ED Nurse Name and Phone #:  Anderson [place] [phone] S Name/Age/Gender [name] 88 y.o. female Room/Bed: 003C/003C  Code Status   Code Status: Partial Code  Home/SNF/Other Skilled nursing facility A&Ox0 Is this baseline? Yes   Triage Complete: Triage complete  Chief Complaint Hypernatremia [E87.0]  Triage Note EMS dispatched for abnormal lab value. Upon EMS arrival pt found to be febrile and tachycardic, diagnosed with pneumonia on 11/10. Pt blind, deaf, and nonverbal. VSS at this time.    Allergies No Known Allergies  Level of Care/Admitting Diagnosis ED Disposition     ED Disposition  Admit   Condition  --   Comment  Hospital Area: [facility] [[id number]]  Level of Care: Progressive [102]  Admit to Progressive based on following criteria: CARDIOVASCULAR & THORACIC of moderate stability with acute coronary syndrome symptoms/low risk myocardial infarction/hypertensive urgency/arrhythmias/heart failure potentially compromising stability and stable post cardiovascular intervention patients.  Admit to Progressive based on following criteria: MULTISYSTEM THREATS such as stable sepsis, metabolic/electrolyte imbalance with or without encephalopathy that is responding to early treatment.  May [facility] patient to [facility] or [facility] if equivalent level of care is available:: Yes  Covid Evaluation: Asymptomatic - no recent exposure (last 10 days) testing not required  Diagnosis: Hypernatremia [433295]  Admitting Physician: Darci Current [[id number]]  Attending Physician: Martyn Malay [[id number]]  Certification:: I certify this patient will need inpatient services for at least 2 midnights  Estimated Length of Stay: 2          B Medical/Surgery History Past Medical History:  Diagnosis Date   Alcohol abuse    Anemia    Aphasia    Cataract    CKD (chronic kidney disease)    CKD (chronic kidney disease), stage IV ([place])     Deaf    Deaf, bilateral    Dementia ([place])    Dementia ([place])    Diabetes mellitus    Dry eyes    Hypercholesteremia    Hyperlipidemia    Hypertension    Past Surgical History:  Procedure Laterality Date   CATARACT EXTRACTION W/ INTRAOCULAR LENS IMPLANT     left eye   EYE SURGERY     PARS PLANA VITRECTOMY Left 05/04/2017   Procedure: PARS PLANA VITRECTOMY LEFT EYE WITH 25 GAUGE WITH ENDOLASER;  Surgeon: Jalene Mullet, MD;  Location: [facility];  Service: Ophthalmology;  Laterality: Left;     A IV Location/Drains/Wounds Patient Lines/Drains/Airways Status     Active Line/Drains/Airways     Name Placement date Placement time Site Days   Peripheral IV 03/09/22 Left;Posterior Forearm 03/09/22  --  Forearm  1   Urethral Catheter Whitney Ezell RN Straight-tip 16 Fr. 03/09/22  2311  Straight-tip  1   Incision (Closed) 05/04/17 Eye Left 05/04/17  0911  -- 1771   Pressure Injury 03/10/22 Hip Right Unstageable - Full thickness tissue loss in which the base of the injury is covered by slough (yellow, tan, gray, green or brown) and/or eschar (tan, brown or black) in the wound bed. 03/10/22  --  -- less than 1   Wound / Incision (Open or Dehisced) 10/08/19 Non-pressure wound;Other (Comment) Toe (Comment  which one) Right;Left;Posterior dried old ulceration 10/08/19  1300  Toe (Comment  which one)  884            Intake/Output Last 24 hours  Intake/Output Summary (Last 24 hours) at 03/10/2022 1256 Last data filed at 03/10/2022 [phone]  Gross per 24 hour  Intake 1000 ml  Output 450 ml  Net 550 ml    Labs/Imaging Results for orders placed or performed during the hospital encounter of 03/09/22 (from the past 48 hour(s))  Blood Culture (routine x 2)     Status: None (Preliminary result)   Collection Time: 03/09/22  4:35 PM   Specimen: BLOOD  Result Value Ref Range   Specimen Description BLOOD SITE NOT SPECIFIED    Special Requests      BOTTLES DRAWN AEROBIC AND ANAEROBIC Blood Culture  results may not be optimal due to an inadequate volume of blood received in culture bottles   Culture      NO GROWTH < 24 HOURS Performed at [facility] [address], [place], [place] [address]    Report Status PENDING   Lactic acid, plasma     Status: None   Collection Time: 03/09/22  4:45 PM  Result Value Ref Range   Lactic Acid, Venous 1.2 0.5 - 1.9 mmol/L    Comment: Performed at [facility] [address] [place], [place] [address]  Comprehensive metabolic panel     Status: Abnormal   Collection Time: 03/09/22  4:45 PM  Result Value Ref Range   Sodium 162 (HH) 135 - 145 mmol/L    Comment: CRITICAL RESULT CALLED TO, READ BACK BY AND VERIFIED WITH C.CAMPBELL,RN @1840  03/09/2022 VANG.J   Potassium 4.4 3.5 - 5.1 mmol/L   Chloride 124 (H) 98 - 111 mmol/L   CO2 27 22 - 32 mmol/L   Glucose, Bld 97 70 - 99 mg/dL    Comment: Glucose reference range applies only to samples taken after fasting for at least 8 hours.   BUN 104 (H) 8 - 23 mg/dL   Creatinine, Ser 4.53 (H) 0.44 - 1.00 mg/dL   Calcium 9.4 8.9 - 10.3 mg/dL   Total Protein 7.1 6.5 - 8.1 g/dL   Albumin 2.5 (L) 3.5 - 5.0 g/dL   AST 17 15 - 41 U/L   ALT 10 0 - 44 U/L   Alkaline Phosphatase 77 38 - 126 U/L   Total Bilirubin 0.6 0.3 - 1.2 mg/dL   GFR, Estimated 9 (L) >60 mL/min    Comment: (NOTE) Calculated using the CKD-EPI Creatinine Equation (2021)    Anion gap 11 5 - 15    Comment: Performed at [facility] [address], [place], [place] [address]  CBC with Differential     Status: Abnormal   Collection Time: 03/09/22  4:45 PM  Result Value Ref Range   WBC 9.4 4.0 - 10.5 K/uL   RBC 3.27 (L) 3.87 - 5.11 MIL/uL   Hemoglobin 11.2 (L) 12.0 - 15.0 g/dL   HCT 37.1 36.0 - 46.0 %   MCV 113.5 (H) 80.0 - 100.0 fL   MCH 34.3 (H) 26.0 - 34.0 pg   MCHC 30.2 30.0 - 36.0 g/dL   RDW 14.3 11.5 - 15.5 %   Platelets 229 150 - 400 K/uL   nRBC 0.0 0.0 - 0.2 %   Neutrophils Relative % 76 %   Neutro Abs 7.1  1.7 - 7.7 K/uL   Lymphocytes Relative 17 %   Lymphs Abs 1.6 0.7 - 4.0 K/uL   Monocytes Relative 7 %   Monocytes Absolute 0.6 0.1 - 1.0 K/uL   Eosinophils Relative 0 %   Eosinophils Absolute 0.0 0.0 - 0.5 K/uL   Basophils Relative 0 %   Basophils Absolute  0.0 0.0 - 0.1 K/uL   Immature Granulocytes 0 %   Abs Immature Granulocytes 0.04 0.00 - 0.07 K/uL    Comment: Performed at [facility], [place] [address], [place], [place] [address]  Protime-INR     Status: Abnormal   Collection Time: 03/09/22  4:45 PM  Result Value Ref Range   Prothrombin Time 16.6 (H) 11.4 - 15.2 seconds   INR 1.4 (H) 0.8 - 1.2    Comment: (NOTE) INR goal varies based on device and disease states. Performed at [facility], [place] [address], [place], [place] [address]   APTT     Status: None   Collection Time: 03/09/22  4:45 PM  Result Value Ref Range   aPTT 31 24 - 36 seconds    Comment: Performed at [facility] [address] [place], [place] [address]  Brain natriuretic peptide     Status: Abnormal   Collection Time: 03/09/22  4:45 PM  Result Value Ref Range   B Natriuretic Peptide 513.9 (H) 0.0 - 100.0 pg/mL    Comment: Performed at [facility] [address] [place], [place] [address]  Urinalysis, Routine w reflex microscopic Urine, Clean Catch     Status: None   Collection Time: 03/09/22  7:48 PM  Result Value Ref Range   Color, Urine YELLOW YELLOW   APPearance CLEAR CLEAR   Specific Gravity, Urine 1.010 1.005 - 1.030   pH 5.0 5.0 - 8.0   Glucose, UA NEGATIVE NEGATIVE mg/dL   Hgb urine dipstick NEGATIVE NEGATIVE   Bilirubin Urine NEGATIVE NEGATIVE   Ketones, ur NEGATIVE NEGATIVE mg/dL   Protein, ur NEGATIVE NEGATIVE mg/dL   Nitrite NEGATIVE NEGATIVE   Leukocytes,Ua NEGATIVE NEGATIVE   RBC / HPF 0-5 0 - 5 RBC/hpf   WBC, UA 0-5 0 - 5 WBC/hpf   Bacteria, UA NONE SEEN NONE SEEN   Squamous Epithelial / LPF 0-5 0 - 5    Comment: Performed at [facility], [place]  [address], [place],  [address]  Resp Panel by RT-PCR (Flu A&B, Covid) Anterior Nasal Swab     Status: None   Collection Time: 03/09/22  8:56 PM   Specimen: Anterior Nasal Swab  Result Value Ref Range   SARS Coronavirus 2 by RT PCR NEGATIVE NEGATIVE    Comment: (NOTE) SARS-CoV-2 target nucleic acids are NOT DETECTED.  The SARS-CoV-2 RNA is generally detectable in upper respiratory specimens during the acute phase of infection. The lowest concentration of SARS-CoV-2 viral copies this assay can detect is 138 copies/mL. A negative result does not preclude SARS-Cov-2 infection and should not be used as the sole basis for treatment or other patient management decisions. A negative result may occur with  improper specimen collection/handling, submission of specimen other than nasopharyngeal swab, presence of viral mutation(s) within the areas targeted by this assay, and inadequate number of viral copies(<138 copies/mL). A negative result must be combined with clinical observations, patient history, and epidemiological information. The expected result is Negative.  Fact Sheet for Patients:  EntrepreneurPulse.com.au  Fact Sheet for Healthcare Providers:  IncredibleEmployment.be  This test is no t yet approved or cleared by the [place] FDA and  has been authorized for detection and/or diagnosis of SARS-CoV-2 by FDA under an Emergency Use Authorization (EUA). This EUA will remain  in effect (meaning this test can be used) for the duration of the COVID-19 declaration under Section 564(b)(1) of the Act, 21 U.S.C.section  360bbb-3(b)(1), unless the authorization is terminated  or revoked sooner.       Influenza A by PCR NEGATIVE NEGATIVE   Influenza B by PCR NEGATIVE NEGATIVE    Comment: (NOTE) The Xpert Xpress SARS-CoV-2/FLU/RSV plus assay is intended as an aid in the diagnosis of influenza from Nasopharyngeal swab specimens and should not be used  as a sole basis for treatment. Nasal washings and aspirates are unacceptable for Xpert Xpress SARS-CoV-2/FLU/RSV testing.  Fact Sheet for Patients: EntrepreneurPulse.com.au  Fact Sheet for Healthcare Providers: IncredibleEmployment.be  This test is not yet approved or cleared by the [place] FDA and has been authorized for detection and/or diagnosis of SARS-CoV-2 by FDA under an Emergency Use Authorization (EUA). This EUA will remain in effect (meaning this test can be used) for the duration of the COVID-19 declaration under Section 564(b)(1) of the Act, 21 U.S.C. section 360bbb-3(b)(1), unless the authorization is terminated or revoked.  Performed at [facility], [place] [address], [place], [place] [address]   Lactic acid, plasma     Status: None   Collection Time: 03/09/22 10:27 PM  Result Value Ref Range   Lactic Acid, Venous 1.8 0.5 - 1.9 mmol/L    Comment: Performed at [facility] [address] [place], [place] [address]  Osmolality     Status: Abnormal   Collection Time: 03/09/22 10:27 PM  Result Value Ref Range   Osmolality 367 (HH) 275 - 295 mOsm/kg    Comment: CRITICAL RESULT CALLED TO, READ BACK BY AND VERIFIED WITH: WHITNEY EZELL 03/10/22 2956 SJL Performed at [facility], [place] [place], [place] [address]   Basic metabolic panel     Status: Abnormal   Collection Time: 03/09/22 10:27 PM  Result Value Ref Range   Sodium 159 (H) 135 - 145 mmol/L   Potassium 4.4 3.5 - 5.1 mmol/L   Chloride 125 (H) 98 - 111 mmol/L   CO2 24 22 - 32 mmol/L   Glucose, Bld 127 (H) 70 - 99 mg/dL    Comment: Glucose reference range applies only to samples taken after fasting for at least 8 hours.   BUN 98 (H) 8 - 23 mg/dL   Creatinine, Ser 4.17 (H) 0.44 - 1.00 mg/dL   Calcium 8.8 (L) 8.9 - 10.3 mg/dL   GFR, Estimated 10 (L) >60 mL/min    Comment: (NOTE) Calculated using the CKD-EPI Creatinine Equation (2021)     Anion gap 10 5 - 15    Comment: Performed at [facility] [address] [place], [place] [address]  Basic metabolic panel     Status: Abnormal   Collection Time: 03/10/22  4:29 AM  Result Value Ref Range   Sodium 155 (H) 135 - 145 mmol/L   Potassium 4.1 3.5 - 5.1 mmol/L   Chloride 120 (H) 98 - 111 mmol/L   CO2 23 22 - 32 mmol/L   Glucose, Bld 138 (H) 70 - 99 mg/dL    Comment: Glucose reference range applies only to samples taken after fasting for at least 8 hours.   BUN 104 (H) 8 - 23 mg/dL   Creatinine, Ser 4.35 (H) 0.44 - 1.00 mg/dL   Calcium 9.3 8.9 - 10.3 mg/dL   GFR, Estimated 9 (L) >60 mL/min    Comment: (NOTE) Calculated using the CKD-EPI Creatinine Equation (2021)    Anion gap 12 5 - 15    Comment: Performed at [facility] [address],  [place], [place] [address]  Magnesium     Status: Abnormal   Collection Time: 03/10/22  4:29 AM  Result Value Ref Range   Magnesium 2.7 (H) 1.7 - 2.4 mg/dL    Comment: Performed at [facility] [address] [place], [place] [address]  CBC     Status: Abnormal   Collection Time: 03/10/22  4:29 AM  Result Value Ref Range   WBC 9.8 4.0 - 10.5 K/uL   RBC 3.26 (L) 3.87 - 5.11 MIL/uL   Hemoglobin 11.0 (L) 12.0 - 15.0 g/dL   HCT 37.3 36.0 - 46.0 %   MCV 114.4 (H) 80.0 - 100.0 fL   MCH 33.7 26.0 - 34.0 pg   MCHC 29.5 (L) 30.0 - 36.0 g/dL   RDW 14.0 11.5 - 15.5 %   Platelets 223 150 - 400 K/uL   nRBC 0.0 0.0 - 0.2 %    Comment: Performed at [facility], [place] [address], [place], [place] [address]  TSH     Status: None   Collection Time: 03/10/22  4:29 AM  Result Value Ref Range   TSH 0.687 0.350 - 4.500 uIU/mL    Comment: Performed by a 3rd Generation assay with a functional sensitivity of <=0.01 uIU/mL. Performed at [facility], [place] [address] [place], [place] [address]   I-Stat arterial blood gas, ED     Status: Abnormal   Collection Time: 03/10/22 11:43 AM  Result Value Ref Range   pH,  Arterial 7.431 7.35 - 7.45   pCO2 arterial 38.8 32 - 48 mmHg   pO2, Arterial 60 (L) 83 - 108 mmHg   Bicarbonate 25.8 20.0 - 28.0 mmol/L   TCO2 27 22 - 32 mmol/L   O2 Saturation 91 %   Acid-Base Excess 1.0 0.0 - 2.0 mmol/L   Sodium 160 (H) 135 - 145 mmol/L   Potassium 3.9 3.5 - 5.1 mmol/L   Calcium, Ion 1.28 1.15 - 1.40 mmol/L   HCT 30.0 (L) 36.0 - 46.0 %   Hemoglobin 10.2 (L) 12.0 - 15.0 g/dL   Collection site RADIAL, ALLEN'S TEST ACCEPTABLE    Drawn by RT    Sample type ARTERIAL    EEG adult  Result Date: 03/10/2022 Lora Havens, MD     03/10/2022 12:11 PM Patient Name: WHITLEY PATCHEN MRN: [id number] Epilepsy Attending: Lora Havens Referring Physician/Provider: Arlyce Dice, MD Date: 03/10/2022 Duration: 23.43 mins Patient history: 88yo F with ams. EEG to evaluate for seizure Level of alertness: Awake, asleep AEDs during EEG study: Technical aspects: This EEG study was done with scalp electrodes positioned according to the 10-20 International system of electrode placement. Electrical activity was reviewed with band pass filter of 1-70Hz , sensitivity of 7 uV/mm, display speed of 34mm/sec with a 60Hz  notched filter applied as appropriate. EEG data were recorded continuously and digitally stored.  Video monitoring was available and reviewed as appropriate. Description: During awake state, no clear posterior dominant rhythm was seen.  Sleep was characterized by sleep spindles (12 to 14 Hz), maximal frontocentral region. EEG also showed continuous generalized 3 to 5 Hz theta-delta slowing. Hyperventilation and photic stimulation were not performed.   ABNORMALITY - Continuous slow, generalized IMPRESSION: This study is suggestive of moderate diffuse encephalopathy, nonspecific etiology. No seizures or epileptiform discharges were seen throughout the recording. Lora Havens   CT Chest Wo Contrast  Result Date: 03/09/2022 CLINICAL DATA:  Pneumonia EXAM: CT CHEST WITHOUT CONTRAST  TECHNIQUE: Multidetector  CT imaging of the chest was performed following the standard protocol without IV contrast. RADIATION DOSE REDUCTION: This exam was performed according to the departmental dose-optimization program which includes automated exposure control, adjustment of the mA and/or kV according to patient size and/or use of iterative reconstruction technique. COMPARISON:  Same day chest x-ray FINDINGS: Cardiovascular: Mild cardiomegaly. Trace pericardial effusion. Thoracic aorta is nonaneurysmal. Thin crescentic hyperdensity along the course of the thoracic aorta extending from approximately the level of the distal arch to at least the aortic hiatus (series 5, image 35 and 48). Vascular evaluation is limited on noncontrast imaging. Atherosclerotic calcifications of the aorta and coronary arteries. Central pulmonary vasculature is nondilated. Mediastinum/Nodes: No adenopathy is seen within the chest. Trachea and esophagus are grossly unremarkable. Lungs/Pleura: Trace left pleural effusion with minimal dependent airspace consolidation. 5 mm subpleural nodule at the right lung base (series 3, image 111). No pneumothorax. Upper Abdomen: Motion artifact limits evaluation of the upper abdominal structures. No obvious acute abnormality within this limitation. Musculoskeletal: Severe degenerative changes of both glenohumeral joints with extensive osseous remodeling. There are moderate-large bilateral glenohumeral joint effusions, nonspecific. Cachectic habitus with diffuse anasarca. IMPRESSION: 1. Thin crescentic hyperdensity along the course of the thoracic aorta extending from approximately the level of the distal arch to at least the aortic hiatus. Appearance suggests an intramural hematoma. Cardiothoracic consultation and further evaluation with CT angiogram is recommended to assess for aortic dissection. 2. Trace left pleural effusion with minimal dependent airspace consolidation, which may represent  atelectasis or pneumonia. 3. Severe degenerative changes of both glenohumeral joints with extensive osseous remodeling. There are moderate-large bilateral glenohumeral joint effusions, nonspecific. 4. 5 mm subpleural nodule at the right lung base. No follow-up needed if patient is low-risk.This recommendation follows the consensus statement: Guidelines for Management of Incidental Pulmonary Nodules Detected on CT Images: From the Fleischner Society 2017; Radiology 2017; 284:228-243. 5. Aortic and coronary artery atherosclerosis (ICD10-I70.0). These results were called by telephone at the time of interpretation on 03/09/2022 at 7:57 pm to provider Fredia Sorrow , who verbally acknowledged these results. Electronically Signed   By: Davina Poke D.O.   On: 03/09/2022 19:58   CT Head Wo Contrast  Result Date: 03/09/2022 CLINICAL DATA:  Altered mental status. EXAM: CT HEAD WITHOUT CONTRAST TECHNIQUE: Contiguous axial images were obtained from the base of the skull through the vertex without intravenous contrast. RADIATION DOSE REDUCTION: This exam was performed according to the departmental dose-optimization program which includes automated exposure control, adjustment of the mA and/or kV according to patient size and/or use of iterative reconstruction technique. COMPARISON:  Head CT dated 07/09/2021. FINDINGS: Brain: Moderate age-related atrophy and chronic microvascular ischemic changes. There is no acute intracranial hemorrhage. No mass effect or midline shift. No extra-axial fluid collection. Vascular: No hyperdense vessel or unexpected calcification. Skull: Normal. Negative for fracture or focal lesion. Sinuses/Orbits: No acute finding. Other: None IMPRESSION: 1. No acute intracranial pathology. 2. Moderate age-related atrophy and chronic microvascular ischemic changes. Electronically Signed   By: Anner Crete M.D.   On: 03/09/2022 19:47   DG Chest Port 1 View  Result Date: 03/09/2022 CLINICAL  DATA:  Possible sepsis EXAM: PORTABLE CHEST 1 VIEW COMPARISON:  01/05/2022 FINDINGS: Transverse diameter of heart is increased. There are no signs of pulmonary edema. Apparent shift of mediastinum to the left may be due to rotation. There is no focal pulmonary consolidation. Linear densities in left lower lung fields may suggest crowding of markings due to elevation of left hemidiaphragm  or subsegmental atelectasis. There is no significant pleural effusion or pneumothorax. 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 There are lucencies in left humerus and scapula which may be related to chronic dislocation and dysplasia. Possibility of neoplastic process such as metastatic disease is not excluded. IMPRESSION: Cardiomegaly. Linear densities in left lower lung fields may suggest subsegmental atelectasis or early pneumonia. Part of this finding may be due to elevation of left hemidiaphragm. 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 There are lucencies in proximal left humerus and left scapula which may be related to chronic dysplasia or neoplastic process such as metastatic disease. If clinically warranted, follow-up radionuclide bone scan or MRI may be considered. Electronically Signed   By: Elmer Picker M.D.   On: 03/09/2022 17:25    Pending Labs Unresulted Labs (From admission, onward)     Start     Ordered   03/11/22 0500  CBC  Tomorrow morning,   R        03/10/22 0952   03/11/22 0500  Magnesium  Tomorrow morning,   R        03/10/22 0952   03/10/22 8938  Basic metabolic panel  ONCE - STAT,   STAT        03/10/22 1248   03/10/22 1029  Blood gas, arterial  Once,   R        03/10/22 1031   03/09/22 1017  Basic metabolic panel  Now then every 6 hours,   R      03/09/22 2055   03/09/22 1552  Blood Culture (routine x 2)  (Undifferentiated presentation (screening labs and basic nursing orders))  BLOOD CULTURE X 2,   STAT       03/09/22 1554   03/09/22 1552  Urine Culture  (Undifferentiated presentation (screening labs and basic nursing orders))  ONCE - URGENT,   URGENT       Question:  Indication  Answer:  Sepsis   03/09/22 1554            Vitals/Pain Today's Vitals   03/10/22 0900 03/10/22 0930 03/10/22 1130 03/10/22 1200  BP: (!) 156/83 123/77 120/80 137/86  Pulse: 98 99 95 (!) 146  Resp: (!) 32 (!) 26 (!) 26 (!) 28  Temp:      TempSrc:      SpO2: 92% 97% 96% 95%  Weight:      Height:        Isolation Precautions Airborne and Contact precautions  Medications Medications  0.9 %  sodium chloride infusion (0 mLs Intravenous Stopping previously hung infusion 03/10/22 1228)  heparin injection 5,000 Units (has no administration in time range)  leptospermum manuka honey (MEDIHONEY) paste 1 Application (has no administration in time range)  sodium chloride 0.9 % bolus 500 mL (0 mLs Intravenous Stopped 03/09/22 1740)  LORazepam (ATIVAN) injection 1 mg (1 mg Intravenous Given 03/09/22 1858)  dextrose 5 % solution (0 mLs Intravenous Stopped 03/10/22 0559)    Mobility non-ambulatory Moderate fall risk   R Recommendations: See Admitting Provider Note  Report given to:   Additional Notes: A&O x0, deaf, blind, multiple wounds, foley, critical Na 162 on arrival

## 2022-03-10 NOTE — ED Notes (Signed)
Critical lab osmolality reported to MD lillian.

## 2022-03-10 NOTE — Consult Note (Signed)
[facility] Nurse Consult Note: Reason for Consult: Consult requested for pressure injury.  Performed remotely after review of progress notes and photo in the EMR.  Wound type: Right hip with Unstageable pressure injury; 100% yellow and moist Pressure Injury POA: Yes Dressing procedure/placement/frequency: Topical treatment orders provided for bedside nurses to perform as follows to assist with removal of nonviable tissue: Apply Medihoney to sacrum wound Q day, then cover with foam dressing.  Change foam dressing Q 3 days or PRN soiling. Please re-consult if further assistance is needed.  Thank-you,  Julien Girt MSN, [place], [place], [place], [place]

## 2022-03-10 NOTE — Progress Notes (Signed)
  Spoke with son of patient, Faten Frieson, to get more insight into patient's clinical situation. Son says that patient is wheelchair bound and usually able to move in bed. She requires assistance with feeds and is active enough to want to be wheeled around the nursing facility. But a few weeks ago, patient has become more lethargic,per the son. She has not been moving much and has not wanted to eat. The SNF wanted her to come to the hospital due to her poor oral intake. He also says that sometimes she has bouts of less activity and seems disengaged but these have subsided within a short period of time in the past. Son was here last night and spoke with the ED provider, he is aware of her situation. Update provided. Code status clarified that she is DNI, medications and chest compressions are okay but no intubation. He also clarified that she is hospice status and will resume hospice care after this hospitalization but he still would like all the treatments as able without aggressive intervention such as surgery. I told him that we will update him as changes occur and discuss best course of treatments as appropriate. He was very appreciative of the call and is agreeable to the team calling him should we need to speak with him at anytime. He will be coming to the hospital later this morning.   Donney Dice, DO 03/10/2022, 9:05 AM PGY-3, [facility] pager [phone]

## 2022-03-10 NOTE — Assessment & Plan Note (Deleted)
Home med: Clonidine, Occasional increases in blood pressure are due to patient having bent arm when measuring, once patient straightens arm, BP is wnl range.  - BP has been within range, given AKI and dehydration, holding clonidine, if BP can tolerate, restart clonidine patch  - Monitor BP q 4 hours and dose with hydralazine if > 140/90

## 2022-03-10 NOTE — Procedures (Signed)
Patient Name: [name]  MRN: [id number]  Epilepsy Attending: Lora Havens  Referring Physician/Provider: Arlyce Dice, MD  Date: 03/10/2022 Duration: 23.43 mins  Patient history: 88yo F with ams. EEG to evaluate for seizure  Level of alertness: Awake, asleep  AEDs during EEG study:   Technical aspects: This EEG study was done with scalp electrodes positioned according to the 10-20 International system of electrode placement. Electrical activity was reviewed with band pass filter of 1-70Hz , sensitivity of 7 uV/mm, display speed of 73mm/sec with a 60Hz  notched filter applied as appropriate. EEG data were recorded continuously and digitally stored.  Video monitoring was available and reviewed as appropriate.  Description: During awake state, no clear posterior dominant rhythm was seen.  Sleep was characterized by sleep spindles (12 to 14 Hz), maximal frontocentral region. EEG also showed continuous generalized 3 to 5 Hz theta-delta slowing. Hyperventilation and photic stimulation were not performed.     ABNORMALITY - Continuous slow, generalized  IMPRESSION: This study is suggestive of moderate diffuse encephalopathy, nonspecific etiology. No seizures or epileptiform discharges were seen throughout the recording.  Gregery Walberg Barbra Sarks

## 2022-03-10 NOTE — Progress Notes (Signed)
EEG complete - results pending 

## 2022-03-10 NOTE — Progress Notes (Addendum)
  Attempted to call patient's son but no response. Went to bedside to check on patient, son also at bedside along with attending, Dr. Owens Shark. Extensive discussion on patient's baseline and how she has progressed to this hospitalization. Son feels hopeful and feels she is improved. We discussed her worsening renal function and hypernatremia, likely secondary to dehydration but unsure if this is a temporary condition. Further discussed code status, we conveyed our concern about chest compressions given patient's thoracic aortic dissection. I explained that I am worried that chest compressions may do more harm. I conveyed to son, and daughter who walked in later during the encounter, that cardiothoracic surgery consulted initially and deemed her not a candidate for potential surgical intervention. Upon further discussion, son says that they would like more time to discuss code status and would like to continue DNI status at this time. I explained that the team will convey updates as they come in and palliative care has been consulted per agreement of son to help aid in these goals of care discussions to ensure that the team is doing what patient would have desired. Will continue to closely monitor patient.    Donney Dice, DO 03/10/2022, 12:26 PM PGY-3, [facility] pager [phone]

## 2022-03-10 NOTE — Progress Notes (Signed)
  Subjective: Patient examined and the results of the chest CT scan performed late yesterday reviewed with patient's son at the bedside.  He understands the aortic wall thickening/possible hematoma is very likely an incidental finding and is not an acute problem as the patient does not have the associated findings of hypertension and pain and pericardial effusion associated with aortic dissection.  Objective: Vital signs in last 24 hours: Temp:  [97.5 F (36.4 C)-100 F (37.8 C)] 99.5 F (37.5 C) (11/16 0733) Pulse Rate:  [50-146] 146 (11/16 1200) Cardiac Rhythm: Normal sinus rhythm (11/15 1550) Resp:  [12-35] 28 (11/16 1200) BP: (97-156)/(63-131) 137/86 (11/16 1200) SpO2:  [91 %-100 %] 95 % (11/16 1200) Weight:  [40.3 kg] 40.3 kg (11/15 1537)  Hemodynamic parameters for last 24 hours:  Normal sinus rhythm  Intake/Output from previous day: 11/15 0701 - 11/16 0700 In: 1000 [I.V.:500; IV Piggyback:500] Out: -  Intake/Output this shift: Total I/O In: -  Out: 450 [Urine:450]  Exam Patient nonresponsive curled up on her left side in bed, appears cachectic and extremely debilitated No cardiac murmur, normal sinus rhythm Atrophic skin changes of all of her extremities with some ulceration No purposeful response on exam  Lab Results: Recent Labs    03/09/22 1645 03/10/22 0429 03/10/22 1143  WBC 9.4 9.8  --   HGB 11.2* 11.0* 10.2*  HCT 37.1 37.3 30.0*  PLT 229 223  --    BMET:  Recent Labs    03/09/22 2227 03/10/22 0429 03/10/22 1143  NA 159* 155* 160*  K 4.4 4.1 3.9  CL 125* 120*  --   CO2 24 23  --   GLUCOSE 127* 138*  --   BUN 98* 104*  --   CREATININE 4.17* 4.35*  --   CALCIUM 8.8* 9.3  --     PT/INR:  Recent Labs    03/09/22 1645  LABPROT 16.6*  INR 1.4*   ABG    Component Value Date/Time   PHART 7.431 03/10/2022 1143   HCO3 25.8 03/10/2022 1143   TCO2 27 03/10/2022 1143   O2SAT 91 03/10/2022 1143   CBG (last 3)  No results for input(s):  "GLUCAP" in the last 72 hours.  Assessment/Plan: S/P CT scan showing probable incidental finding of chronic type B aortic intramural hematoma No surgery is indicated Normotensive blood pressure and lipid control are the best medical therapies for this patients atherosclerotic vascular disease  LOS: 1 day    Dahlia Byes 03/10/2022

## 2022-03-11 ENCOUNTER — Inpatient Hospital Stay (HOSPITAL_COMMUNITY): Payer: Medicare Other

## 2022-03-11 DIAGNOSIS — R4182 Altered mental status, unspecified: Secondary | ICD-10-CM | POA: Diagnosis not present

## 2022-03-11 DIAGNOSIS — I71019 Dissection of thoracic aorta, unspecified: Secondary | ICD-10-CM

## 2022-03-11 DIAGNOSIS — N179 Acute kidney failure, unspecified: Secondary | ICD-10-CM | POA: Diagnosis not present

## 2022-03-11 DIAGNOSIS — R6510 Systemic inflammatory response syndrome (SIRS) of non-infectious origin without acute organ dysfunction: Secondary | ICD-10-CM | POA: Diagnosis not present

## 2022-03-11 DIAGNOSIS — R509 Fever, unspecified: Secondary | ICD-10-CM | POA: Diagnosis not present

## 2022-03-11 DIAGNOSIS — E870 Hyperosmolality and hypernatremia: Secondary | ICD-10-CM | POA: Diagnosis not present

## 2022-03-11 DIAGNOSIS — A419 Sepsis, unspecified organism: Secondary | ICD-10-CM | POA: Diagnosis not present

## 2022-03-11 LAB — CBC
HCT: 34.5 % — ABNORMAL LOW (ref 36.0–46.0)
Hemoglobin: 10 g/dL — ABNORMAL LOW (ref 12.0–15.0)
MCH: 32.6 pg (ref 26.0–34.0)
MCHC: 29 g/dL — ABNORMAL LOW (ref 30.0–36.0)
MCV: 112.4 fL — ABNORMAL HIGH (ref 80.0–100.0)
Platelets: 225 10*3/uL (ref 150–400)
RBC: 3.07 MIL/uL — ABNORMAL LOW (ref 3.87–5.11)
RDW: 13.8 % (ref 11.5–15.5)
WBC: 8.5 10*3/uL (ref 4.0–10.5)
nRBC: 0 % (ref 0.0–0.2)

## 2022-03-11 LAB — BASIC METABOLIC PANEL
Anion gap: 12 (ref 5–15)
Anion gap: 8 (ref 5–15)
Anion gap: 12 (ref 5–15)
Anion gap: 15 (ref 5–15)
BUN: 101 mg/dL — ABNORMAL HIGH (ref 8–23)
BUN: 102 mg/dL — ABNORMAL HIGH (ref 8–23)
BUN: 102 mg/dL — ABNORMAL HIGH (ref 8–23)
BUN: 97 mg/dL — ABNORMAL HIGH (ref 8–23)
CO2: 23 mmol/L (ref 22–32)
CO2: 23 mmol/L (ref 22–32)
CO2: 23 mmol/L (ref 22–32)
CO2: 21 mmol/L — ABNORMAL LOW (ref 22–32)
Calcium: 9.3 mg/dL (ref 8.9–10.3)
Calcium: 9.3 mg/dL (ref 8.9–10.3)
Calcium: 9 mg/dL (ref 8.9–10.3)
Calcium: 9.2 mg/dL (ref 8.9–10.3)
Chloride: 123 mmol/L — ABNORMAL HIGH (ref 98–111)
Chloride: 125 mmol/L — ABNORMAL HIGH (ref 98–111)
Chloride: 119 mmol/L — ABNORMAL HIGH (ref 98–111)
Chloride: 118 mmol/L — ABNORMAL HIGH (ref 98–111)
Creatinine, Ser: 4.4 mg/dL — ABNORMAL HIGH (ref 0.44–1.00)
Creatinine, Ser: 4.3 mg/dL — ABNORMAL HIGH (ref 0.44–1.00)
Creatinine, Ser: 4.25 mg/dL — ABNORMAL HIGH (ref 0.44–1.00)
Creatinine, Ser: 4.04 mg/dL — ABNORMAL HIGH (ref 0.44–1.00)
GFR, Estimated: 9 mL/min — ABNORMAL LOW (ref >60)
GFR, Estimated: 9 mL/min — ABNORMAL LOW (ref >60)
GFR, Estimated: 10 mL/min — ABNORMAL LOW (ref >60)
GFR, Estimated: 10 mL/min — ABNORMAL LOW (ref >60)
Glucose, Bld: 112 mg/dL — ABNORMAL HIGH (ref 70–99)
Glucose, Bld: 97 mg/dL (ref 70–99)
Glucose, Bld: 133 mg/dL — ABNORMAL HIGH (ref 70–99)
Glucose, Bld: 140 mg/dL — ABNORMAL HIGH (ref 70–99)
Potassium: 3.8 mmol/L (ref 3.5–5.1)
Potassium: 4 mmol/L (ref 3.5–5.1)
Potassium: 3.8 mmol/L (ref 3.5–5.1)
Potassium: 3.7 mmol/L (ref 3.5–5.1)
Sodium: 158 mmol/L — ABNORMAL HIGH (ref 135–145)
Sodium: 156 mmol/L — ABNORMAL HIGH (ref 135–145)
Sodium: 154 mmol/L — ABNORMAL HIGH (ref 135–145)
Sodium: 154 mmol/L — ABNORMAL HIGH (ref 135–145)

## 2022-03-11 LAB — URINALYSIS, ROUTINE W REFLEX MICROSCOPIC
Bilirubin Urine: NEGATIVE
Glucose, UA: NEGATIVE mg/dL
Ketones, ur: NEGATIVE mg/dL
Leukocytes,Ua: NEGATIVE
Nitrite: NEGATIVE
Protein, ur: 30 mg/dL — AB
Specific Gravity, Urine: 1.012 (ref 1.005–1.030)
pH: 5 (ref 5.0–8.0)

## 2022-03-11 LAB — URINE CULTURE: Culture: NO GROWTH

## 2022-03-11 LAB — MRSA NEXT GEN BY PCR, NASAL: MRSA by PCR Next Gen: DETECTED — AB

## 2022-03-11 LAB — MAGNESIUM: Magnesium: 2.7 mg/dL — ABNORMAL HIGH (ref 1.7–2.4)

## 2022-03-11 MED ORDER — SODIUM CHLORIDE 0.9 % IV SOLN
1.0000 g | INTRAVENOUS | Status: DC
  Administered 2022-03-11 – 2022-03-12 (×2): 1 g via INTRAVENOUS
  Filled 2022-03-11 (×3): qty 10

## 2022-03-11 MED ORDER — ACETAMINOPHEN 325 MG PO TABS
650.0000 mg | ORAL_TABLET | Freq: Four times a day (QID) | ORAL | Status: DC | PRN
  Administered 2022-03-13: 650 mg via ORAL
  Filled 2022-03-11: qty 2

## 2022-03-11 MED ORDER — ACETAMINOPHEN 650 MG RE SUPP
650.0000 mg | Freq: Four times a day (QID) | RECTAL | Status: DC | PRN
  Administered 2022-03-11 – 2022-03-12 (×2): 650 mg via RECTAL
  Filled 2022-03-11 (×2): qty 1

## 2022-03-11 MED ORDER — VANCOMYCIN HCL 500 MG/100ML IV SOLN
500.0000 mg | Freq: Once | INTRAVENOUS | Status: AC
  Administered 2022-03-11: 500 mg via INTRAVENOUS
  Filled 2022-03-11: qty 100

## 2022-03-11 MED ORDER — BACITRACIN-NEOMYCIN-POLYMYXIN OINTMENT TUBE
TOPICAL_OINTMENT | CUTANEOUS | Status: DC | PRN
  Filled 2022-03-11: qty 14

## 2022-03-11 MED ORDER — CHLORHEXIDINE GLUCONATE CLOTH 2 % EX PADS
6.0000 | MEDICATED_PAD | Freq: Every day | CUTANEOUS | Status: DC
  Administered 2022-03-11 – 2022-03-13 (×3): 6 via TOPICAL

## 2022-03-11 NOTE — Progress Notes (Signed)
FMTS Interim Progress Note  Went to bedside and son also present. Discussed that patient had a fever earlier today and we are undergoing all the workup for this. We had an extensive discussion about her progress during this hospitalization. I also explained that given her worsening renal function, we have contacted the nephrologist for further recommendations. The son later brought up code status and said that after thinking about our discussion since yesterday, he wants his mother to still be partial code but no longer wants chest compressions. He still wants medications to be administered. We will continue to update son as we await workup.   Donney Dice, DO 03/11/2022, 2:22 PM PGY-3, [facility] pager [phone]

## 2022-03-11 NOTE — Progress Notes (Signed)
Pharmacy Antibiotic Note  [name] is a 88 y.o. female admitted on 03/09/2022 with pneumonia.  Pharmacy has been consulted for vancomycin dosing.  Scr 4.25; Wt = 40.3 kg  Plan: Given patient's small size and renal function, will give vancomycin 500 mg x 1 now, then determine subsequent doses based on levels.  Height: 5\' 2"  (157.5 cm) Weight: 40.3 kg (88 lb 13.5 oz) IBW/kg (Calculated) : 50.1  Temp (24hrs), Avg:100.2 F (37.9 C), Min:98 F (36.7 C), Max:101.7 F (38.7 C)  Recent Labs  Lab 03/09/22 1645 03/09/22 2227 03/10/22 0429 03/10/22 1337 03/10/22 1558 03/10/22 2233 03/11/22 0444 03/11/22 0950 03/11/22 1627  WBC 9.4  --  9.8  --   --   --  8.5  --   --   CREATININE 4.53* 4.17* 4.35*   < > 4.40* 4.29* 4.40* 4.30* 4.25*  LATICACIDVEN 1.2 1.8  --   --   --   --   --   --   --    < > = values in this interval not displayed.    Estimated Creatinine Clearance: 5.9 mL/min (A) (by C-G formula based on SCr of 4.25 mg/dL (H)).    No Known Allergies   Thank you for allowing pharmacy to be a part of this patient's care.   [place] Crane, Roylene Reason, BCCP Clinical Pharmacist  03/11/2022 6:43 PM   [facility] pharmacy phone numbers are listed on [email or web address]

## 2022-03-11 NOTE — Progress Notes (Addendum)
Daily Progress Note Intern Pager: [phone]  Patient name: [name] record number: [id number] Date of birth: [birth year] Age: 88 y.o. Gender: female  Primary Care Provider: Lajean Saver, NP Consultants: CT Surgery, Nephrology, Palliative   Code Status: DNI  Pt Overview and Major Events to Date:  Admitted - 11/14  Assessment and Plan: [name] is a 88 yo female who presented hypernatremia and altered mental status. Hypernatremia improving, however she has a tenuous mental status. Now, hospital course complicated by sepsis.   * Hypernatremia Hypernatremia most likely due to dehydration in the setting of difficulty w/ communication and being in SNF.  - Continue D5 at 100 mL/hr, increased overnight as rate of decrease of hypernatremia was too slow  - Goal correction >6<12 mEq/L per day - BMP every 6 hours, adjust fluids accordingly - Monitor mental status, delirium precautions - Consult nephrology to discuss fluids   Sepsis, most likely pulmonary source Patient is now febrile to 101.7 with tachycardia. Patient is nonverbal and non responsive at baseline. Difficult to assess worsening mental status.  - Bcx, Ucx, CXR ordered  - Start cefepime, hold off vancomycin for now, will start if MRSA PCR positiev   Altered mental status According to son and SNF mental status is close to baseline as patient has been declining recently. CT Head showed no acute abnormalities and EEG showed diffuse encephalopathy.  - Continue to treat hypernatremia - Continue palliative discussion with son to also discuss difficulty with feeding given altered mental status  Hypertension Home med: Clonidine, Occasional increases in blood pressure are due to patient having bent arm when measuring, once patient straightens arm, BP is wnl range.  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  - Cardiothoracic surgery recommendations, no intervention if patient is hospice  - Monitor blood pressures, prn hydral to maintain bp in range   Goals of care, counseling/discussion Patient is currently DNI per son.  She is extremely cachectic nonverbal, blind, deaf with severe dementia documented previously.  She is currently on hospice care ([facility]) with morphine. - Palliative team has been consulted  - Son's goal is to fix hypernatremia and get patient to start eating again so she can return to [place] difficulty with feeding with mental status as it is currently   AKI (acute kidney injury) ([place]) Cr 4.4 today, baseline Cr is <2. Prerenal AKI due to dehydration.  - Continue D5 at 100 mL/hr, increased rate to help with AKI  - Trend BMP every 6 hours - Avoid nephrotoxic agents as able - Hold home torsemide - Consult nephrology to discuss fluids to treat AKI and hypernatremia   Pressure injury of skin Several noted pressure injuries on the skin prior to admission.  Pictures are documented in the chart.  Appears to be stage I pressure ulcer on the right hip - Continue wound care - Has air mattress  - Frequent repositioning    FEN/GI: NPO, consider starting feeding today, D5 @ 100 mL/hr  LDA: Will leave Foley in given critical illness, has been placed for 24 hours, doubt as source for fever  PPx: [facility] Dispo:Remain admitted, treating hypernatremia   Subjective:  Patient is unresponsive to touch.  She would wince slightly to sternal rub.   Objective: Temp:  [98 F (36.7 C)-101.7 F (38.7 C)] 100.9 F (38.3 C) (11/17 0930) Pulse Rate:  [75-146] 89 (11/17 0930) Resp:  [18-29] 20 (11/17 0930) BP: (119-177)/(80-146) 146/84 (11/17 0930) SpO2:  [91 %-100 %] 97 % (11/17 0827) Physical  Exam: General: Ill appearing, tremulous, feels warm  Cardiovascular: tachycardic, regular rhythm, good pulses palpable, cap refill 3 seconds Respiratory: Normal work of breathing on room air, normal respiratory rate Abdomen: cachectic, no response to palpation  Extremities: cachectic  Neuro: tremulous throughout, not alert, slight response to noxious stimuli, PERRLA  Skin: Wounds examined, no surrounding erythema or drainage Foley with clear urine   Laboratory: Most recent CBC Lab Results  Component Value Date   WBC 8.5 03/11/2022   HGB 10.0 (L) 03/11/2022   HCT 34.5 (L) 03/11/2022   MCV 112.4 (H) 03/11/2022   PLT 225 03/11/2022   Most recent BMP    Latest Ref Rng & Units 03/11/2022    4:44 AM  BMP  Glucose 70 - 99 mg/dL 112   BUN 8 - 23 mg/dL 101   Creatinine 0.44 - 1.00 mg/dL 4.40   Sodium 135 - 145 mmol/L 158   Potassium 3.5 - 5.1 mmol/L 3.8   Chloride 98 - 111 mmol/L 123   CO2 22 - 32 mmol/L 23   Calcium 8.9 - 10.3 mg/dL 9.3    Lowry Ram, MD 03/11/2022, 11:18 AM  PGY-1, [facility] Intern pager: [phone], text pages welcome Secure chat group [place]

## 2022-03-11 NOTE — Assessment & Plan Note (Deleted)
She had a fever of 100.6 overnight.  Unclear source of fever but presumed PNA. She's on cefepime and Vancomycin was added last night.  Not up to 24 hours on Vanc, we will continue to monitor fever curve.  Blood cultures to date is without growth and MRSA swab was positive.  -Continue cefepime and Vanco for abx -Monitor fever curve with routine vitals -Continue to follow blood culture

## 2022-03-11 NOTE — Progress Notes (Signed)
Pharmacy Antibiotic Note  [name] is a 88 y.o. female admitted on 03/09/2022 with hypernatremia and AMS. Patient is now febrile with tachycardia. Pharmacy has been consulted for cefepime dosing for suspected infectious cause.  Plan: -Cefepime 1 gm q24hrs -Trend WBC, temp as appropriate -F/U infectious work-up  Height: 5\' 2"  (157.5 cm) Weight: 40.3 kg (88 lb 13.5 oz) IBW/kg (Calculated) : 50.1  Temp (24hrs), Avg:99.8 F (37.7 C), Min:98 F (36.7 C), Max:101.7 F (38.7 C)  Recent Labs  Lab 03/09/22 1645 03/09/22 2227 03/10/22 0429 03/10/22 1337 03/10/22 1558 03/10/22 2233 03/11/22 0444 03/11/22 0950  WBC 9.4  --  9.8  --   --   --  8.5  --   CREATININE 4.53* 4.17* 4.35* 4.38* 4.40* 4.29* 4.40* 4.30*  LATICACIDVEN 1.2 1.8  --   --   --   --   --   --     Estimated Creatinine Clearance: 5.9 mL/min (A) (by C-G formula based on SCr of 4.3 mg/dL (H)).    No Known Allergies  Antimicrobials this admission: Cefepime 1 gm q24hr  >>  Microbiology results: 03/11/22 BCx: pending 03/09/22 BCx: No growth 2 days 03/11/22 Ucx: pending 03/09/22 UCx: no growth  03/11/22 MRSA PCR: pending  Thank you for allowing pharmacy to be a part of this patient's care.  Garrel Ridgel 03/11/2022 12:20 PM

## 2022-03-11 NOTE — Consult Note (Addendum)
[facility] Nephrology Consultation Note  Requesting MD: Dr. Owens Shark, Magdalene Molly Reason for consult: AKI, Hypernatremia  HPI:  [name] is a 88 y.o. female with past medical history of hypertension, HLD, CKD stage IV, advanced dementia, hearing impairment, aphasia, physical deconditioning and was presented from SNF for  severe dehydration and change in mental status, seen as a consultation for the evaluation of hypernatremia and AKI on CKD. It seems like the patient has CKD stage IV with baseline creatinine level is around 2.  On presentation, serum sodium level was 162, potassium 4.4, BUN 104, creatinine level 4.53.  She was a started on IV fluid with fluctuation in serum sodium level.  Reportedly she has very poor oral intake.  She was placed on hypotonic fluid/D5W with improvement of sodium to 156 and creatinine level 4.3 today.  Serum osmolality 367.  Urine output is major 650 cc since the morning. The kidney ultrasound done in 09/2019 with echogenic kidneys suggestive of advanced chronic medical disease. She is febrile to 101.2 therefore placed on broad-spectrum antibiotics, cefepime.  Blood and urine cultures were sent. Patient is moving her mouth and face but she is nonverbal, unable to hear and cannot see well. Most of the information gathered from the chart and discussion with the primary team and the nursing staff.  Review of system is limited.  PMHx:   Past Medical History:  Diagnosis Date   Alcohol abuse    Anemia    Aphasia    Cataract    CKD (chronic kidney disease)    CKD (chronic kidney disease), stage IV (HCC)    Deaf    Deaf, bilateral    Dementia ([place])    Dementia ([place])    Diabetes mellitus    Dry eyes    Hypercholesteremia    Hyperlipidemia    Hypertension     Past Surgical History:  Procedure Laterality Date   CATARACT EXTRACTION W/ INTRAOCULAR LENS IMPLANT     left eye   EYE SURGERY     PARS PLANA VITRECTOMY Left 05/04/2017   Procedure:  PARS PLANA VITRECTOMY LEFT EYE WITH 25 GAUGE WITH ENDOLASER;  Surgeon: Jalene Mullet, MD;  Location: [place];  Service: Ophthalmology;  Laterality: Left;    Family Hx:  Family History  Family history unknown: Yes    Social History:  reports that she has quit smoking. She has never used smokeless tobacco. She reports that she does not currently use alcohol. She reports that she does not use drugs.  Allergies: No Known Allergies  Medications: Prior to Admission medications   Medication Sig Start Date End Date Taking? Authorizing Provider  acetaminophen (TYLENOL) 650 MG suppository Place 650 mg rectally every 6 (six) hours as needed for mild pain or fever.   Yes [provider]  ALBUTEROL SULFATE IN Take 1 vial by nebulization every other day. 03/08/22 03/17/22 Yes [provider]  benztropine (COGENTIN) 0.5 MG tablet Take 0.5 mg by mouth 2 (two) times daily. 07/16/20  Yes [provider]  Bismuth Tribromoph-Petrolatum (XEROFORM PETROLATUM DRESSING EX) Apply 1 Application topically See admin instructions. Apply to left lower leg topically every day shift every 2 days for wound healing.   Yes [provider]  brimonidine-timolol (COMBIGAN) 0.2-0.5 % ophthalmic solution Place 1 drop into both eyes 2 (two) times daily.   Yes [provider]  cloNIDine (CATAPRES) 0.2 MG tablet Take 0.2 mg by mouth daily as needed (HTN).   Yes [provider]  diclofenac  Sodium (VOLTAREN) 1 % GEL Apply 2 g topically 2 (two) times daily. Left shoulder 08/07/20  Yes [provider]  diphenhydrAMINE (BENADRYL) 25 mg capsule Take 25 mg by mouth every 6 (six) hours as needed for itching or allergies.   Yes [provider]  Emollient (EUCERIN) lotion Apply 1 Application topically See admin instructions. Apply to feet topically every day shift every 2 days for dry feet. Apply to clean feet and cover with socks.   Yes [provider]  ferrous  sulfate 325 (65 FE) MG tablet Take 325 mg by mouth daily.   Yes [provider]  hydrocortisone cream 1 % Apply 1 Application topically daily. Apply to face around eyes topically one time a day for periorbital dermatitis for 14 days to cleansed face. 03/08/22 03/21/22 Yes [provider]  Hyoscyamine Sulfate SL 0.125 MG SUBL Place 0.125 mg under the tongue every 4 (four) hours as needed (excess secretions). 07/01/21  Yes [provider]  ILEVRO 0.3 % ophthalmic suspension Place 1 drop into the left eye every other day. 08/02/19  Yes [provider]  Lactobacillus Rhamnosus, GG, (CULTURELLE) CAPS Take 1 capsule by mouth daily. 03/06/22 03/15/22 Yes [provider]  mirtazapine (REMERON) 7.5 MG tablet Take 7.5 mg by mouth at bedtime.   Yes [provider]  morphine (ROXANOL) 20 MG/ML concentrated solution Take 0.25 mLs by mouth every 4 (four) hours as needed for severe pain or shortness of breath.   Yes [provider]  neomycin-bacitracin-polymyxin (NEOSPORIN) [phone] ointment Apply 1 Application topically daily. Apply to 2nd toe on left foot until 03/11/22   Yes [provider]  olopatadine (PATADAY) 0.1 % ophthalmic solution Place 1 drop into both eyes 2 (two) times daily.   Yes [provider]  OXYGEN Inhale 2 L/min into the lungs every hour as needed (keep sats>90%).   Yes [provider]  sennosides-docusate sodium (SENOKOT-S) 8.6-50 MG tablet Take 2 tablets by mouth daily.   Yes [provider]  silver sulfADIAZINE (SILVADENE) 1 % cream Apply 1 Application topically daily. Apply to face around eyes topically one time a day for periorbital dermatitis for 14 days. Wash face with mild soap/water and blot dry. Thin layer 03/08/22  Yes [provider]  torsemide (DEMADEX) 10 MG tablet Take 10 mg by mouth daily.   Yes [provider]  traZODone (DESYREL) 50 MG tablet Take 75 mg by mouth  daily.   Yes [provider]  cloNIDine (CATAPRES) 0.3 MG tablet Take 1 tablet (0.3 mg total) by mouth 2 (two) times daily. (0700) Patient not taking: Reported on 03/09/2022 10/19/19   Patrecia Pour, MD  furosemide (LASIX) 20 MG tablet Take 1 tablet (20 mg total) by mouth daily. Patient not taking: Reported on 03/09/2022 01/05/22   Lajean Saver, MD    I have reviewed the patient's current medications.  Labs: Renal Panel: Recent Labs    07/09/21 1837 01/05/22 1541 03/09/22 1645 03/09/22 2227 03/10/22 0429 03/10/22 1143 03/10/22 1337 03/10/22 1558 03/10/22 2233 03/11/22 0444 03/11/22 0950  NA 135 141 162* 159* 155* 160* 162* 160* 159* 158* 156*  K 4.6 4.7 4.4 4.4 4.1 3.9 4.3 4.1 3.9 3.8 4.0  CL 101 110 124* 125* 120*  --  128* 125* 125* 123* 125*  CO2 26 27 27 24 23   --  25 23 22 23 23   GLUCOSE 91 107* 97 127* 138*  --  104* 101* 106* 112* 97  BUN  64* 56* 104* 98* 104*  --  107* 106* 108* 101* 102*  CREATININE 2.12* 2.37* 4.53* 4.17* 4.35*  --  4.38* 4.40* 4.29* 4.40* 4.30*  CALCIUM 9.6 8.8* 9.4 8.8* 9.3  --  9.2 9.4 9.2 9.3 9.3  MG  --   --   --   --  2.7*  --   --   --   --  2.7*  --   ALBUMIN 3.8 3.1* 2.5*  --   --   --   --   --   --   --   --      CBC:    Latest Ref Rng & Units 03/11/2022    4:44 AM 03/10/2022   11:43 AM 03/10/2022    4:29 AM  CBC  WBC 4.0 - 10.5 K/uL 8.5   9.8   Hemoglobin 12.0 - 15.0 g/dL 10.0  10.2  11.0   Hematocrit 36.0 - 46.0 % 34.5  30.0  37.3   Platelets 150 - 400 K/uL 225   223      Anemia Panel:  Recent Labs    01/05/22 1541 03/09/22 1645 03/10/22 0429 03/10/22 1143 03/11/22 0444  HGB 9.4* 11.2* 11.0* 10.2* 10.0*  MCV 107.6* 113.5* 114.4*  --  112.4*    Recent Labs  Lab 03/09/22 1645  AST 17  ALT 10  ALKPHOS 77  BILITOT 0.6  PROT 7.1  ALBUMIN 2.5*    Lab Results  Component Value Date   HGBA1C 5.8 (H) 10/17/2019    ROS: Unable to obtain review of system as patient is confused, demented.  Physical  Exam: Vitals:   03/11/22 0930 03/11/22 1208  BP: (!) 146/84 (!) 139/109  Pulse: 89 100  Resp: 20 20  Temp: (!) 100.9 F (38.3 C) (!) 101.2 F (38.4 C)  SpO2:  94%     General exam: Chronically ill looking female lying on bed, moving facial muscles, nonverbal and unable to hear.  Dry mucous membrane Respiratory system: Clear to auscultation. Respiratory effort normal. No wheezing or crackle Cardiovascular system: S1 & S2 heard, RRR.  No pedal edema. Gastrointestinal system: Abdomen is nondistended, soft. Normal bowel sounds heard. Central nervous system: Nonverbal, dementia. Extremities: Cachectic, no edema Skin: No rashes, lesions or ulcers Psychiatry: Nonverbal, confused and has dementia.  Assessment/Plan:  #Hypovolemic hypernatremia/severe dehydration: Patient is unable to eat or drink and currently NPO.  She looks very dry on physical exam.  I agree with continuing IV fluid, increase rate of D5W to 125 cc an hour.  Continue to monitor lab.  #Acute on CKD stage IV: Nonoliguric.  AKI is due to hemodynamically mediated in the setting of severe dehydration and sepsis.  It seems like she had received NS initially for volume expansion and now on hypotonic fluid.  It is very hard to assess uremic symptoms given of her poor baseline mental status.  Based on poor functional status, multiple severe comorbidities and significant deconditioning, she is not a candidate for renal replacement therapy.  Recommend palliative and hospice care.  Meantime time, continue supportive care and monitor lab.  Avoid nephrotoxin including IV contrast.  #Acute febrile illness: Currently on cefepime.  Blood and urine culture pending.  Per primary team.  #Acute change in mental status: It seems like she has poor baseline mental status with severe dementia, Parkinson.  Now completely not taking orally.  Supportive care as discussed above.  #Hypertension: BP variable.  Antihypertensive currently on  hold.  Discussed with RN.  Thank you for the consult.  We will continue to follow.  Janiylah Hannis Tanna Furry 03/11/2022, 3:11 PM  [employer].

## 2022-03-11 NOTE — Progress Notes (Signed)
FMTS Interim Progress Note  S: Assessed with Dr. Jeani Hawking. Patient is resting comfortably with moderate movement that does not appear agitated. Spoke with Rn at bedside, will continue IV fluids and monitor urine output.   O: BP 133/82 (BP Location: Right Arm)   Pulse 85   Temp 99.1 F (37.3 C) (Oral)   Resp 19   Ht 5\' 2"  (1.575 m)   Wt 40.3 kg   SpO2 94%   BMI 16.25 kg/m   Chronically ill-appearing, no acute distress, cachectic  Cardio: Regular rate, regular rhythm, no murmurs on exam. Pulm: Clear, no wheezing, no crackles. No increased work of breathing Abdominal: bowel sounds present Extremities: no peripheral edema, atrophied and deconditioned   A/P: Hypernatremia:  - continue IV D5 at 125 mL/hr  - BMP q4h  - Na expected to drop but will need to slow fluids down if drops <148 overnight  - monitor urine output, does not appear volume overloaded   Sepsis:  Febrile today - continue antibiotics for CAP  - re-culture if fevers overnight   Darci Current, DO 03/11/2022, 9:04 PM PGY-1, [facility] pager [phone]

## 2022-03-11 NOTE — Care Management Important Message (Signed)
Important Message  Patient Details  Name: [name] MRN: [id number] Date of Birth: 01-Jun-1932   Medicare Important Message Given:  Yes     Shelda Altes 03/11/2022, 9:27 AM

## 2022-03-11 NOTE — Plan of Care (Signed)
  Problem: Education: Goal: Knowledge of General Education information will improve Description Including pain rating scale, medication(s)/side effects and non-pharmacologic comfort measures Outcome: Progressing   

## 2022-03-11 NOTE — Progress Notes (Signed)
Brief Palliative Medicine Progress Note:  Chart review performed.  Noted Nephrology consulted today - patient not a candidate for renal replacement therapy. Patient's son now also agreeable to partial code to include no chest compressions along with no intubation.   Son preferred discussions with attending team but PMT will continue to shadow for needs as patient is at high risk for decline.   Per PMT [place] discussion on 11/16 - goal is for patient's return to [place] with hospice.  Thank you for allowing PMT to assist in the care of this patient.  Dezhane Staten M. Tamala Julian [place] [facility] Palliative Medicine Team Team Phone: [phone] NO CHARGE

## 2022-03-11 NOTE — Progress Notes (Signed)
CRITICAL VALUE STICKER  CRITICAL VALUE:+MRSA   RECEIVER (on-site recipient of call):Haru Shaff Thalia Bloodgood   DATE & TIME NOTIFIED: 03/11/2022 1805  MESSENGER (representative from lab):Gwenith Spitz  MD NOTIFIED: Vergia Alberts   TIME OF NOTIFICATION:03/11/2022 1811  RESPONSE: Pending new orders

## 2022-03-11 NOTE — Progress Notes (Signed)
Consulted nephrology and spoke to Dr. Carolin Sicks about patient's hypernatremia and elevated creatinine.  Patient's hypernatremia could be secondary to dehydration given poor p.o. intake in the setting of her altered mental status.  Nephrology has agreed to see patient to help with managing her electrolytes.  We appreciate their recommendations and care.  Alen Bleacher, MD PGY-2, [facility] Family Medicine Resident  Please page [phone] with questions.

## 2022-03-11 NOTE — Progress Notes (Signed)
Initial Nutrition Assessment  DOCUMENTATION CODES:   Underweight  INTERVENTION:  - Advance diet as tolerated.   NUTRITION DIAGNOSIS:   Inadequate oral intake related to inability to eat as evidenced by NPO status.  GOAL:   Patient will meet greater than or equal to 90% of their needs  MONITOR:   Diet advancement  REASON FOR ASSESSMENT:   Malnutrition Screening Tool    ASSESSMENT:   88 y.o. admits related to hypernatremia due to dehydration. PMH includes: advanced dementia, hearing impairment, CKD IV, diabetes, HTN. Pt is currently receiving medical management for hypernatremia.  Meds reviewed. Labs reviewed: Na high, BUN/Creatinine high.   Pt is currently NPO and not safe to swallow. RD is working remotely so currently unable to communicate with pt as she requires an interpreter for sign language. No significant wt hx in recent time frame to determine if wt loss meets malnutrition criteria. Pt has lost significant wt though over recent years. Messaged MD team in regards to Cortrak tube. No plans as of now for alternate means of nutrition. MD team is meeting with son to discuss goals of care. RD will closely monitor.   NUTRITION - FOCUSED PHYSICAL EXAM:  RD working remotely at this time, will attempt at follow up.   Diet Order:   Diet Order             Diet NPO time specified  Diet effective now                   EDUCATION NEEDS:   Not appropriate for education at this time  Skin:  Skin Assessment: Skin Integrity Issues: Skin Integrity Issues:: Unstageable Unstageable: right hip  Last BM:  03/09/22  Height:   Ht Readings from Last 1 Encounters:  03/09/22 5\' 2"  (1.575 m)    Weight:   Wt Readings from Last 1 Encounters:  03/09/22 40.3 kg    Ideal Body Weight:  50 kg  BMI:  Body mass index is 16.25 kg/m.  Estimated Nutritional Needs:   Kcal:  1205-1410 kcals  Protein:  60-70 gm  Fluid:  >1.2 L  Thalia Bloodgood, RD, LDN, CNSC

## 2022-03-12 DIAGNOSIS — E870 Hyperosmolality and hypernatremia: Secondary | ICD-10-CM | POA: Diagnosis not present

## 2022-03-12 LAB — BASIC METABOLIC PANEL
Anion gap: 12 (ref 5–15)
Anion gap: 10 (ref 5–15)
Anion gap: 11 (ref 5–15)
Anion gap: 10 (ref 5–15)
BUN: 89 mg/dL — ABNORMAL HIGH (ref 8–23)
BUN: 90 mg/dL — ABNORMAL HIGH (ref 8–23)
BUN: 86 mg/dL — ABNORMAL HIGH (ref 8–23)
BUN: 81 mg/dL — ABNORMAL HIGH (ref 8–23)
CO2: 22 mmol/L (ref 22–32)
CO2: 23 mmol/L (ref 22–32)
CO2: 22 mmol/L (ref 22–32)
CO2: 22 mmol/L (ref 22–32)
Calcium: 9.1 mg/dL (ref 8.9–10.3)
Calcium: 8.8 mg/dL — ABNORMAL LOW (ref 8.9–10.3)
Calcium: 8.8 mg/dL — ABNORMAL LOW (ref 8.9–10.3)
Calcium: 8.8 mg/dL — ABNORMAL LOW (ref 8.9–10.3)
Chloride: 117 mmol/L — ABNORMAL HIGH (ref 98–111)
Chloride: 115 mmol/L — ABNORMAL HIGH (ref 98–111)
Chloride: 112 mmol/L — ABNORMAL HIGH (ref 98–111)
Chloride: 111 mmol/L (ref 98–111)
Creatinine, Ser: 3.82 mg/dL — ABNORMAL HIGH (ref 0.44–1.00)
Creatinine, Ser: 3.74 mg/dL — ABNORMAL HIGH (ref 0.44–1.00)
Creatinine, Ser: 3.45 mg/dL — ABNORMAL HIGH (ref 0.44–1.00)
Creatinine, Ser: 3.35 mg/dL — ABNORMAL HIGH (ref 0.44–1.00)
GFR, Estimated: 11 mL/min — ABNORMAL LOW (ref >60)
GFR, Estimated: 11 mL/min — ABNORMAL LOW (ref >60)
GFR, Estimated: 12 mL/min — ABNORMAL LOW (ref >60)
GFR, Estimated: 13 mL/min — ABNORMAL LOW (ref >60)
Glucose, Bld: 132 mg/dL — ABNORMAL HIGH (ref 70–99)
Glucose, Bld: 135 mg/dL — ABNORMAL HIGH (ref 70–99)
Glucose, Bld: 127 mg/dL — ABNORMAL HIGH (ref 70–99)
Glucose, Bld: 93 mg/dL (ref 70–99)
Potassium: 3.8 mmol/L (ref 3.5–5.1)
Potassium: 3.6 mmol/L (ref 3.5–5.1)
Potassium: 3.6 mmol/L (ref 3.5–5.1)
Potassium: 3.5 mmol/L (ref 3.5–5.1)
Sodium: 151 mmol/L — ABNORMAL HIGH (ref 135–145)
Sodium: 148 mmol/L — ABNORMAL HIGH (ref 135–145)
Sodium: 145 mmol/L (ref 135–145)
Sodium: 143 mmol/L (ref 135–145)

## 2022-03-12 LAB — CBC
HCT: 31.5 % — ABNORMAL LOW (ref 36.0–46.0)
Hemoglobin: 9.8 g/dL — ABNORMAL LOW (ref 12.0–15.0)
MCH: 33.4 pg (ref 26.0–34.0)
MCHC: 31.1 g/dL (ref 30.0–36.0)
MCV: 107.5 fL — ABNORMAL HIGH (ref 80.0–100.0)
Platelets: 223 10*3/uL (ref 150–400)
RBC: 2.93 MIL/uL — ABNORMAL LOW (ref 3.87–5.11)
RDW: 13.5 % (ref 11.5–15.5)
WBC: 7 10*3/uL (ref 4.0–10.5)
nRBC: 0 % (ref 0.0–0.2)

## 2022-03-12 LAB — URINE CULTURE: Culture: NO GROWTH

## 2022-03-12 LAB — VANCOMYCIN, RANDOM: Vancomycin Rm: 10 ug/mL

## 2022-03-12 MED ORDER — VANCOMYCIN VARIABLE DOSE PER UNSTABLE RENAL FUNCTION (PHARMACIST DOSING): Status: DC

## 2022-03-12 MED ORDER — VANCOMYCIN HCL 750 MG/150ML IV SOLN
750.0000 mg | Freq: Once | INTRAVENOUS | Status: AC
  Administered 2022-03-12: 750 mg via INTRAVENOUS
  Filled 2022-03-12: qty 150

## 2022-03-12 MED ORDER — LACTATED RINGERS IV SOLN: INTRAVENOUS | Status: AC

## 2022-03-12 MED ORDER — BENZTROPINE MESYLATE 0.5 MG PO TABS
0.5000 mg | ORAL_TABLET | Freq: Two times a day (BID) | ORAL | Status: DC
  Administered 2022-03-12 – 2022-03-16 (×6): 0.5 mg via ORAL
  Filled 2022-03-12 (×10): qty 1

## 2022-03-12 NOTE — Progress Notes (Signed)
Patient noted to be febrile to 100.6*F at 11:45 pm.   Previous urine culture collected 11/15 at 1552 showed no growth and blood culture collected 11/15 at 1635 is no growth x2 days.    Given patient's fever early 11/17, urine and blood cultures collected prior to abx initiation. CXR concerning for left pleural effusion and possible retrocardiac infection. Antibiotics subsequently started. Cefepime 1g given at 1310, vancomycin 500 mg added at 2042 for MRSA coverage.   Given she is less than 24 hours on antibiotic coverage, will not re-collect cultures at this time. We will continue to watch. If she re-fevers past 24 hours on antibiotics, would pursue collection at that time.   Ezequiel Essex, MD

## 2022-03-12 NOTE — Progress Notes (Addendum)
Daily Progress Note Intern Pager: [phone]  Patient name: [name] record number: [id number] Date of birth: 1933-01-01 Age: 88 y.o. Gender: female  Primary Care Provider: Lajean Saver, NP Consultants: CT surgery, nephrology, palliative Code Status: DNI, partial code without chest compression  Pt Overview and Major Events to Date:  Admitted: 11/14  Assessment and Plan: Timea Vivas a 88 year old female presented with hypernatremia and altered mental status.  Hospital course is complicated with chronic fever of unclear course. Pertinent PMH/PSH includes congenital deafness, nonverbal, blind, nonambulatory, dementia.   * Hypernatremia Hypernatremia improving on D5 normal saline.  Na was 151 this morning, will continue BMP check Q4H. Patient seem to be improving overall. -Nephrology following, appreciate recs - Continue D5 at 125 mL/hr - Goal correction >6<12 mEq/L per day -Continue BMP checks - Monitor mental status, delirium precautions  SIRS (systemic inflammatory response syndrome) (HCC) She had a fever of 100.6 overnight.  Unclear source of fever but presumed PNA. She's on cefepime and Vancomycin was added last night.  Not up to 24 hours on Vanc, we will continue to monitor fever curve.  Blood cultures to date is without growth and MRSA swab was positive.  -Continue cefepime and Vanco for abx -Monitor fever curve with routine vitals -Continue to follow blood culture  Altered mental status Patient appeared to have intermittent AMS at baseline per report from SNF.  Patient admitted for worsening AMS likely secondary to hypernatremia. This morning she is much improved, laying comfortable in bed, appears less agitated and woke to touch of stethoscope on her chest during exam. 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   Surgical intervention not indicated.  Goals of care, counseling/discussion Goals of care as still ongoing conversation with son.  Yesterday reports that he still wants his mom to be DNI, with partial code and no chest compressions.  Sounds and goal is for his mom to return to [place] SNF she which is her baseline.  AKI (acute kidney injury) ([place]) Prerenal AKI in the setting of dehydration.  Kidney function improving IVF.  Last creatinine was 3.82 this morning. -Nephrology following, appreciate recs - Continue D5 at 125 mL/hr - Trend BMP every 6 hours - Avoid nephrotoxic agents as able - Hold home torsemide   Pressure injury of skin Noted multiple pressure ulcers on admission that did not appear infectious. - Continue wound care - Continue on air mattress  -Encourage frequent repositioning   FEN/GI: N.p.o. PPx: Subcu heparin Dispo:SNF pending clinical improvement . Barriers include clinical status.   Subjective:  Patient was laying comfortably in bed upon arrival. Appears more relaxed than yesterday and reactive to tactile stimuli. Mildly startled when bell of stethoscope was placed on her chest for exam.  Objective: Temp:  [98.6 F (37 C)-100.6 F (38.1 C)] 99 F (37.2 C) (11/18 0729) Pulse Rate:  [78-89] 86 (11/18 1020) Resp:  [18-20] 19 (11/18 0729) BP: (133-162)/(70-116) 140/88 (11/18 1020) SpO2:  [90 %-98 %] 95 % (11/18 1020) Weight:  [42.7 kg] 42.7 kg (11/18 0551) Physical Exam: General: Chronically ill-appearing, tremulous of the head  Cardiovascular: RRR, well perfused Respiratory: Normal WOB on RA Abdomen: Soft, nontender, cachectic Extremities: Multiple pressure ulcers on extremities without signs of infection. Neuro: Mildly tremulous mostly of the head, Responsive to tactile stimuli  Laboratory: Most recent CBC Lab Results  Component Value Date  WBC 7.0 03/12/2022   HGB 9.8 (L) 03/12/2022   HCT 31.5 (L) 03/12/2022   MCV 107.5 (H) 03/12/2022   PLT 223  03/12/2022   Most recent BMP    Latest Ref Rng & Units 03/12/2022    9:33 AM  BMP  Glucose 70 - 99 mg/dL 135   BUN 8 - 23 mg/dL 90   Creatinine 0.44 - 1.00 mg/dL 3.74   Sodium 135 - 145 mmol/L 148   Potassium 3.5 - 5.1 mmol/L 3.6   Chloride 98 - 111 mmol/L 115   CO2 22 - 32 mmol/L 23   Calcium 8.9 - 10.3 mg/dL 8.8     Imaging/Diagnostic Tests: No new images  Alen Bleacher, MD 03/12/2022, 12:29 PM  PGY-2, [place] Intern pager: [phone], text pages welcome Secure chat group [place]

## 2022-03-12 NOTE — Progress Notes (Signed)
SLP Cancellation Note  Patient Details Name: ALANNIS HSIA MRN: [id number] DOB: 06/10/1932   Cancelled treatment:       Reason Eval/Treat Not Completed: Fatigue/lethargy limiting ability to participate;Patient's level of consciousness Patient asleep and did not awaken to tactile stimulation, cold compress or voice. SLP will attempt later this date or next date; continue NPO status and focus on oral care as patient tolerates.  Sonia Baller, MA, CCC-SLP Speech Therapy

## 2022-03-12 NOTE — Progress Notes (Addendum)
[facility] NEPHROLOGY PROGRESS NOTE  Assessment/ Plan: Pt is a 88 y.o. yo female  with past medical history of hypertension, HLD, CKD stage IV, advanced dementia, hearing impairment, aphasia, physical deconditioning and was presented from SNF for  severe dehydration and change in mental status, seen as a consultation for the evaluation of hypernatremia and AKI on CKD.   # Hypovolemic hypernatremia/severe dehydration: Patient is unable to eat or drink and currently NPO.  She looks very dry on physical exam.  Sodium level gradually trending down therefore continue hypotonic fluid.  Continue to monitor lab.   #Acute on CKD stage IV: Nonoliguric.  AKI is due to hemodynamically mediated in the setting of severe dehydration and sepsis.  It seems like she had received NS initially for volume expansion and now on hypotonic fluid.  It is very hard to assess uremic symptoms given of her poor baseline mental status.  Based on poor functional status, multiple severe comorbidities and significant deconditioning, she is not a candidate for renal replacement therapy.  Recommend palliative and hospice care.  Meantime time, continue supportive care and monitor lab.  Avoid nephrotoxin including IV contrast. With IV fluid the urine output is going up and creatinine level trending down.  She seems like she is having gradual renal recovery.   #Acute febrile illness: Currently on cefepime.  Blood and urine culture pending.  Per primary team.   #Acute change in mental status: It seems like she has poor baseline mental status with severe dementia, Parkinson.  Now completely not taking orally.  Supportive care as discussed above.   #Hypertension: BP variable.  Antihypertensive currently on hold.  Plan noted to go to hospice, labs improving.  Nothing further to add, Sign off. Please call us with question.  Subjective: Patient was seen and examined at bedside.  Urine output 1.1 L in 24 hours.  No major  event.  Unable to obtain review of system.  Discussed with the nursing staff. Objective Vital signs in last 24 hours: Vitals:   03/11/22 2346 03/12/22 0347 03/12/22 0551 03/12/22 0729  BP: (!) 145/70 (!) 146/93  (!) 162/105  Pulse: 89 86  89  Resp: 18 18  19   Temp: (!) 100.6 F (38.1 C) 98.6 F (37 C)  99 F (37.2 C)  TempSrc: Oral Oral  Oral  SpO2: 94% 95%  90%  Weight:   42.7 kg   Height:       Weight change:   Intake/Output Summary (Last 24 hours) at 03/12/2022 0856 Last data filed at 03/12/2022 0349 Gross per 24 hour  Intake 100 ml  Output 1151 ml  Net -1051 ml       Labs: RENAL PANEL Recent Labs    07/09/21 1837 01/05/22 1541 03/09/22 1645 03/09/22 2227 03/10/22 0429 03/10/22 1143 03/10/22 1337 03/10/22 1558 03/10/22 2233 03/11/22 0444 03/11/22 0950 03/11/22 1627 03/11/22 2148 03/12/22 0726  NA 135 141 162* 159* 155* 160* 162* 160* 159* 158* 156* 154* 154* 151*  K 4.6 4.7 4.4 4.4 4.1 3.9 4.3 4.1 3.9 3.8 4.0 3.8 3.7 3.8  CL 101 110 124* 125* 120*  --  128* 125* 125* 123* 125* 119* 118* 117*  CO2 26 27 27 24 23   --  25 23 22 23 23 23  21* 22  GLUCOSE 91 107* 97 127* 138*  --  104* 101* 106* 112* 97 133* 140* 132*  BUN 64* 56* 104* 98* 104*  --  107* 106* 108* 101* 102* 102* 97* 89*  CREATININE 2.12* 2.37* 4.53* 4.17* 4.35*  --  4.38* 4.40* 4.29* 4.40* 4.30* 4.25* 4.04* 3.82*  CALCIUM 9.6 8.8* 9.4 8.8* 9.3  --  9.2 9.4 9.2 9.3 9.3 9.0 9.2 9.1  MG  --   --   --   --  2.7*  --   --   --   --  2.7*  --   --   --   --   ALBUMIN 3.8 3.1* 2.5*  --   --   --   --   --   --   --   --   --   --   --      Liver Function Tests: Recent Labs  Lab 03/09/22 1645  AST 17  ALT 10  ALKPHOS 77  BILITOT 0.6  PROT 7.1  ALBUMIN 2.5*   No results for input(s): "LIPASE", "AMYLASE" in the last 168 hours. No results for input(s): "AMMONIA" in the last 168 hours. CBC: Recent Labs    03/09/22 1645 03/10/22 0429 03/10/22 1143 03/11/22 0444 03/12/22 0726  HGB 11.2*  11.0* 10.2* 10.0* 9.8*  MCV 113.5* 114.4*  --  112.4* 107.5*    Cardiac Enzymes: No results for input(s): "CKTOTAL", "CKMB", "CKMBINDEX", "TROPONINI" in the last 168 hours. CBG: No results for input(s): "GLUCAP" in the last 168 hours.  Iron Studies: No results for input(s): "IRON", "TIBC", "TRANSFERRIN", "FERRITIN" in the last 72 hours. Studies/Results: DG CHEST PORT 1 VIEW  Result Date: 03/11/2022 CLINICAL DATA:  [id number] Fall [id number] EXAM: PORTABLE CHEST 1 VIEW COMPARISON:  March 09, 2022 FINDINGS: Evaluation is limited by positioning. The cardiomediastinal silhouette is unchanged in contour.Small LEFT pleural effusion. No RIGHT pleural effusion. No pneumothorax. LEFT retrocardiac opacities, unchanged. Similar appearance of periarticular erosions of the bilateral visualized shoulders. IMPRESSION: Small LEFT pleural effusion with LEFT retrocardiac opacities, atelectasis versus infection. Electronically Signed   By: Meda Klinefelter M.D.   On: 03/11/2022 09:33   EEG adult  Result Date: 03/10/2022 Charlsie Quest, MD     03/10/2022 12:11 PM Patient Name: ARLYNE EHLER MRN: [id number] Epilepsy Attending: Charlsie Quest Referring Physician/Provider: Lincoln Brigham, MD Date: 03/10/2022 Duration: 23.43 mins Patient history: 88yo F with ams. EEG to evaluate for seizure Level of alertness: Awake, asleep AEDs during EEG study: Technical aspects: This EEG study was done with scalp electrodes positioned according to the 10-20 International system of electrode placement. Electrical activity was reviewed with band pass filter of 1-70Hz , sensitivity of 7 uV/mm, display speed of 28mm/sec with a 60Hz  notched filter applied as appropriate. EEG data were recorded continuously and digitally stored.  Video monitoring was available and reviewed as appropriate. Description: During awake state, no clear posterior dominant rhythm was seen.  Sleep was characterized by sleep spindles (12 to 14 Hz), maximal  frontocentral region. EEG also showed continuous generalized 3 to 5 Hz theta-delta slowing. Hyperventilation and photic stimulation were not performed.   ABNORMALITY - Continuous slow, generalized IMPRESSION: This study is suggestive of moderate diffuse encephalopathy, nonspecific etiology. No seizures or epileptiform discharges were seen throughout the recording. Priyanka Annabelle Harman    Medications: Infusions:  ceFEPime (MAXIPIME) IV 1 g (03/11/22 1310)   dextrose 125 mL/hr at 03/12/22 2440    Scheduled Medications:  Chlorhexidine Gluconate Cloth  6 each Topical Daily   heparin injection (subcutaneous)  5,000 Units Subcutaneous Q8H   leptospermum manuka honey  1 Application Topical Daily    have reviewed scheduled and prn medications.  Physical Exam: General:  Chronically ill looking frail elderly female lying on bed with movement of facial muscle Heart:RRR, s1s2 nl Lungs:clear b/l, no crackle Abdomen:soft, Non-tender, non-distended Extremities:No edema Neurology: Hard to assess, dementia.  Amora Sheehy Prasad Ziva Nunziata 03/12/2022,8:56 AM  LOS: 3 days

## 2022-03-12 NOTE — Progress Notes (Signed)
Pharmacy Antibiotic Note  [name] is a 88 y.o. female admitted on 03/09/2022 with pneumonia.  Patient AKI on CKD with prerenal AKI in setting of dehydration. Nephro following. Patient weight 42.7 kg. Pharmacy has been consulted for vancomycin and cefepime dosing.  Dosing vancomycin per unstable renal function to be determined by levels. Given vancomycin 500 mg x1 11/17 with random of 10 today. Will need additional dose of vancomycin to achieve therapeutic levels. 11/17 MRSA nares from positive with 11/17 bcx x2 NGTD < 24h. Tmax 100.6 with wbc 7.0.   Plan: Give vancomycin 750 mg IV x1 Continue cefepime 1g IV q24h Obtain vanc random 11/19 AM to assess if additional dose needed  Monitor renal function, culture results, clinical status and resolution of s/sx infection Narrow abx as able and f/u with appropriate duration  Height: 5\' 2"  (157.5 cm) Weight: 42.7 kg (94 lb 2.2 oz) IBW/kg (Calculated) : 50.1  Temp (24hrs), Avg:99.5 F (37.5 C), Min:98.6 F (37 C), Max:100.6 F (38.1 C)  Recent Labs  Lab 03/09/22 1645 03/09/22 2227 03/10/22 0429 03/10/22 1337 03/11/22 0444 03/11/22 0950 03/11/22 1627 03/11/22 2148 03/12/22 0726 03/12/22 0933  WBC 9.4  --  9.8  --  8.5  --   --   --  7.0  --   CREATININE 4.53* 4.17* 4.35*   < > 4.40* 4.30* 4.25* 4.04* 3.82* 3.74*  LATICACIDVEN 1.2 1.8  --   --   --   --   --   --   --   --   VANCORANDOM  --   --   --   --   --   --   --   --  10  --    < > = values in this interval not displayed.    Estimated Creatinine Clearance: 7.1 mL/min (A) (by C-G formula based on SCr of 3.74 mg/dL (H)).    No Known Allergies   Thank you for allowing pharmacy to be a part of this patient's care.   Gena Fray, PharmD PGY1 Pharmacy Resident   03/12/2022 2:21 PM  [facility] pharmacy phone numbers are listed on [email or web address]

## 2022-03-12 NOTE — Progress Notes (Signed)
   Chart reviewed including vital signs, progress notes, labs, imaging.  Noted patient's overnight fever and addition of vancomycin for treatment of likely pneumonia. Noted gradual improvement of kidney function and mental status back to baseline.   Goal remains treating the treatable and return to [facility] SN[facility] to continue following.  PMT will continue to follow peripherally. Thank you for your referral and allowing PMT to assist in [name]'s care.   Dorthy Cooler, [facility] Palliative Medicine Team  Team Phone # [phone]   NO CHARGE

## 2022-03-12 NOTE — Progress Notes (Signed)
FMTS Brief Progress Note  S:Patient seen at bedside with Dr. Jinny Sanders on nighttime rounds.  Patient resting comfortably with Parkinsonism movements, but in no apparent distress.  Vital signs hemodynamically stable.   O: BP (!) 151/56 (BP Location: Right Arm)   Pulse 76   Temp 98.9 F (37.2 C) (Oral)   Resp 20   Ht 5\' 2"  (1.575 m)   Wt 42.7 kg   SpO2 100%   BMI 17.22 kg/m   General: Elderly female laying in bed CV: RRR Respiratory: Normal WOB, no tachypnea Ext: Warm, dry, cachectic  A/P: Acute toxic metabolic encephalopathy Benztropine resumed today, per neurology. -No changes at this time  Hypernatremia and AKI -Continue LR at 100 mL/HR -Follow-up BMP at 10 PM, will adjust fluids if necessary at that time pending Na level (lowered by 0.5 mEq/L per hour)  Pneumonia Has remained afebrile for nearly 24 hours. -Continue IV cefepime, vancomycin -Monitor for fever  - Orders reviewed. Labs for AM ordered, which was adjusted as needed.    Orvis Brill, DO 03/12/2022, 9:00 PM PGY-2, [place] [facility] Night Resident  Please page [phone] with questions.

## 2022-03-12 NOTE — Progress Notes (Signed)
After seeing patient this morning spoke to patient's son and updated him on overnight event. Informed son patient had a fever of 100.74F overnight and Vanco was added to antibiotics.  Also informed him that her sodium is improving and mental status seems to have improved as well this morning.  In the conversation son reported patient's history of Parkinson and on benztropine 0.5 mg twice daily that she gets at her SNF.  Her benztropine was initially held due to her altered mental status on admission.  Informed son I will discuss with neurology to consider restarting her home medication and he was agreeable to plan.  Spoke to Dr. Rory Percy with neurology about patient and he recommended restarting her home benztropine given that she is on a small dose and also given her extra pyramidal movement. Being that she's on hospice she can follow up with her outpatient neurology for any additional management.  Alen Bleacher, MD PGY-2, [facility] Family [facility] Resident  Please page [phone] with questions.

## 2022-03-13 DIAGNOSIS — I71019 Dissection of thoracic aorta, unspecified: Secondary | ICD-10-CM | POA: Diagnosis not present

## 2022-03-13 DIAGNOSIS — Z7189 Other specified counseling: Secondary | ICD-10-CM | POA: Diagnosis not present

## 2022-03-13 DIAGNOSIS — I472 Ventricular tachycardia, unspecified: Secondary | ICD-10-CM | POA: Diagnosis not present

## 2022-03-13 DIAGNOSIS — G20A1 Parkinson's disease without dyskinesia, without mention of fluctuations: Secondary | ICD-10-CM | POA: Diagnosis not present

## 2022-03-13 DIAGNOSIS — R4182 Altered mental status, unspecified: Secondary | ICD-10-CM | POA: Diagnosis not present

## 2022-03-13 LAB — CBC
HCT: 32.6 % — ABNORMAL LOW (ref 36.0–46.0)
Hemoglobin: 10 g/dL — ABNORMAL LOW (ref 12.0–15.0)
MCH: 33.3 pg (ref 26.0–34.0)
MCHC: 30.7 g/dL (ref 30.0–36.0)
MCV: 108.7 fL — ABNORMAL HIGH (ref 80.0–100.0)
Platelets: 219 10*3/uL (ref 150–400)
RBC: 3 MIL/uL — ABNORMAL LOW (ref 3.87–5.11)
RDW: 13.1 % (ref 11.5–15.5)
WBC: 5.9 10*3/uL (ref 4.0–10.5)
nRBC: 0 % (ref 0.0–0.2)

## 2022-03-13 LAB — BASIC METABOLIC PANEL
Anion gap: 10 (ref 5–15)
Anion gap: 11 (ref 5–15)
BUN: 81 mg/dL — ABNORMAL HIGH (ref 8–23)
BUN: 72 mg/dL — ABNORMAL HIGH (ref 8–23)
CO2: 21 mmol/L — ABNORMAL LOW (ref 22–32)
CO2: 22 mmol/L (ref 22–32)
Calcium: 8.9 mg/dL (ref 8.9–10.3)
Calcium: 9 mg/dL (ref 8.9–10.3)
Chloride: 112 mmol/L — ABNORMAL HIGH (ref 98–111)
Chloride: 110 mmol/L (ref 98–111)
Creatinine, Ser: 3.23 mg/dL — ABNORMAL HIGH (ref 0.44–1.00)
Creatinine, Ser: 2.89 mg/dL — ABNORMAL HIGH (ref 0.44–1.00)
GFR, Estimated: 13 mL/min — ABNORMAL LOW (ref >60)
GFR, Estimated: 15 mL/min — ABNORMAL LOW (ref >60)
Glucose, Bld: 101 mg/dL — ABNORMAL HIGH (ref 70–99)
Glucose, Bld: 79 mg/dL (ref 70–99)
Potassium: 3.5 mmol/L (ref 3.5–5.1)
Potassium: 3.6 mmol/L (ref 3.5–5.1)
Sodium: 143 mmol/L (ref 135–145)
Sodium: 143 mmol/L (ref 135–145)

## 2022-03-13 LAB — PHOSPHORUS: Phosphorus: 4.3 mg/dL (ref 2.5–4.6)

## 2022-03-13 LAB — VANCOMYCIN, RANDOM: Vancomycin Rm: 15 ug/mL

## 2022-03-13 LAB — MAGNESIUM: Magnesium: 2.3 mg/dL (ref 1.7–2.4)

## 2022-03-13 MED ORDER — LACTATED RINGERS IV SOLN: INTRAVENOUS | Status: AC

## 2022-03-13 MED ORDER — SODIUM CHLORIDE 0.9 % IV SOLN
1.0000 g | INTRAVENOUS | Status: DC
  Administered 2022-03-13: 1 g via INTRAVENOUS
  Filled 2022-03-13: qty 10

## 2022-03-13 MED ORDER — VANCOMYCIN HCL 750 MG/150ML IV SOLN
750.0000 mg | Freq: Once | INTRAVENOUS | Status: AC
  Administered 2022-03-13: 750 mg via INTRAVENOUS
  Filled 2022-03-13: qty 150

## 2022-03-13 NOTE — Progress Notes (Signed)
Patient experienced 10 beat Vtach. HR 150s. Current BP 128/92. HR 88. MD notified.  Daymon Larsen, RN

## 2022-03-13 NOTE — Progress Notes (Signed)
   Chart reviewed including vital signs, progress notes, labs.  Noted patient's continues to improve back to baseline, although still at high risk for rapid decline.  Goal remains treating the treatable and return to [facility] SN[facility] with [facility] to continue following. Son is participating in [place] discussions with patient's primary physicians per his preference.  PMT will continue to follow peripherally. Thank you for your referral and allowing PMT to assist in Mrs. Bollinger B Mcclimans's care.   Dorthy Cooler, [facility] Palliative Medicine Team  Team Phone # [phone]   NO CHARGE

## 2022-03-13 NOTE — Progress Notes (Signed)
FMTS Interim Progress Notes   Went to see patient as patient's son mentioned swelling in patient's right arm greater than left arm. Patient's arm is more swollen compared to prior and compared to other arm. Usually appears contracted and cachectic. Not pitting, warm, or erythematous, but more dusky to prior. Patient did not grimace in reaction to palpating.  Plan  - VAS [place] RUE for possible DVT  - continue to monitor

## 2022-03-13 NOTE — Progress Notes (Addendum)
Daily Progress Note Intern Pager: [phone]  Patient name: [name] record number: [id number] Date of birth: [birth year] Age: 88 y.o. Gender: female  Primary Care Provider: Lajean Saver, NP Consultants: Neurology, Nephrology, Palliative Care, Wound care  Code Status: DNI  Pt Overview and Major Events to Date:  11/14 - Admitted  11/17 - Started on vancomycin and cefepime for pneumonia with risk factors for MDR 11/19- de-escalated to ceftriaxone+ vancomycin   Assessment and Plan: [name] a 88 year old female presented with hypernatremia and altered mental status.  Hospital course is complicated with chronic fever of unclear course now being treated with broad spectrum antibiotics for possible pneumonia.   Pertinent PMH/PSH includes congenital deafness, nonverbal, blind, nonambulatory, dementia.   * Hypernatremia Hypernatremia improved to Na of 143 this AM. Corrected 8 meq over 24 hours.  -Nephrology following, appreciate recs - Continue LR 100 mL/hr, transitioning from hypotonic to isotonic fluids now that hypernatremia has improved greatly - Decrease fluid rate tonight to see if hypernatremia remains resolved  -Continue BMP checks BID - Monitor mental status, delirium precautions  V tach ([place]) Patient had 10 beats of V tach this morning. Most likely because she is critically ill.  - EKG ordered  - Checking electrolytes with BMP, Phos, K this evening 6pm.   Parkinson disease Patient with home benztropine for Parkinson's movements  - Continue benztropine now that status has started to improve.   SIRS (systemic inflammatory response syndrome) (HCC) Afebrile currently. Source still presumed PNA. MRSA positive. Bcx NG2D -Discontinue cefepime, transition to vancomycin and ceftriaxone  -Monitor fever curve with routine vitals  Altered mental status Patient appeared to have intermittent AMS at baseline per report from SNF.  Appears improved responds to  touch.  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  Surgical intervention not indicated.  Goals of care, counseling/discussion Goals of care as still ongoing conversation with son.  Yesterday reports that he still wants his mom to be DNI, with partial code and no chest compressions.  Sounds and goal is for his mom to return to [facility] SNF she which is her baseline.  AKI (acute kidney injury) ([place]) Prerenal AKI in the setting of dehydration.  AKI improving with fluids Cr this AM 3.23. Marland Kitchen -Nephrology following, appreciate recs - Continue LR @ 100 mL/hr (Maintenance + fluid resuscitation)   - Trend BMP BID - Avoid nephrotoxic agents as able, including IV contrast  - Hold home torsemide   Pressure injury of skin Noted multiple pressure ulcers on admission that did not appear infectious. - Continue wound care - Continue on air mattress  -Encourage frequent repositioning   FEN/GI: NPO, LR @ 100  PPx: UFH Dispo: Remain admitted for IV antibiotics    Subjective:  Patient faced me and had a more pleasant look on her face.   Objective: Temp:  [98.5 F (36.9 C)-98.9 F (37.2 C)] 98.6 F (37 C) (11/19 0803) Pulse Rate:  [76-88] 82 (11/19 0838) Resp:  [19-20] 19 (11/19 0803) BP: (128-160)/(56-92) 128/92 (11/19 0838) SpO2:  [93 %-100 %] 97 % (11/19 0803) Physical Exam: General: Chronically ill appearing; however improved from prior  Cardiovascular: RRR, pulses palpable and equal  Respiratory: Normal work of breathing on room air  Abdomen: cachectic , no grimacing to palpation, no distention  Extremities: Thin,  no edema  Neuro: PERRLA, less tremulous, faces me when I apply light touch   Laboratory: Most recent CBC Lab Results  Component Value Date   WBC 5.9 03/13/2022   HGB 10.0 (L)  03/13/2022   HCT 32.6 (L) 03/13/2022   MCV 108.7 (H) 03/13/2022   PLT 219 03/13/2022   Most recent BMP    Latest Ref Rng & Units 03/13/2022    1:13 AM  BMP  Glucose 70 - 99 mg/dL 101   BUN 8 - 23 mg/dL 81   Creatinine 0.44 - 1.00 mg/dL 3.23   Sodium 135 - 145 mmol/L 143   Potassium 3.5 - 5.1 mmol/L 3.5   Chloride 98 - 111 mmol/L 112   CO2 22 - 32 mmol/L 21   Calcium 8.9 - 10.3 mg/dL 8.9     Lowry Ram, MD 03/13/2022, 10:21 AM  PGY-1, [place] Intern pager: [phone], text pages welcome Secure chat group [place]

## 2022-03-13 NOTE — Assessment & Plan Note (Signed)
Shorter runs of Vtach overnight. Continue to monitor.  - electrolytes within normal range, will continue to replete as appropriate  - Continue telemetry

## 2022-03-13 NOTE — Evaluation (Signed)
Clinical/Bedside Swallow Evaluation Patient Details  Name: [name] MRN: [id number] Date of Birth: February 18, 1933  Today's Date: 03/13/2022 Time: SLP Start Time (ACUTE ONLY): 51 SLP Stop Time (ACUTE ONLY): 1048 SLP Time Calculation (min) (ACUTE ONLY): 30 min  Past Medical History:  Past Medical History:  Diagnosis Date   Alcohol abuse    Anemia    Aphasia    Cataract    CKD (chronic kidney disease)    CKD (chronic kidney disease), stage IV ([place])    Deaf    Deaf, bilateral    Dementia ([place])    Dementia ([place])    Diabetes mellitus    Dry eyes    Hypercholesteremia    Hyperlipidemia    Hypertension    Past Surgical History:  Past Surgical History:  Procedure Laterality Date   CATARACT EXTRACTION W/ INTRAOCULAR LENS IMPLANT     left eye   EYE SURGERY     PARS PLANA VITRECTOMY Left 05/04/2017   Procedure: PARS PLANA VITRECTOMY LEFT EYE WITH 25 GAUGE WITH ENDOLASER;  Surgeon: Jalene Mullet, MD;  Location: [place];  Service: Ophthalmology;  Laterality: Left;   HPI:  Pt is an 88 yo pt presenting 11/15 with hypernatremia and AMS. Hospital course is complicated with chronic fever of unclear course, now being treated with broad spectrum antibiotics for possible PNA. PMH includes: PD, congenital deafness, congenital deafness, nonambulatory, dementia, aphasia, alcohol abuse, CKD    Assessment / Plan / Recommendation  Clinical Impression  Pt is alert and participatory today during swallow eval, unfortunately not able to get an in-person interpreter, so iPad was used with video interpreter Minette Brine [phone]). At times pt appeared to respond to interpreter (questionably signing back "yes" but difficult to discern in the setting of tremor and hand positioning at baseline). Pt also appeared to respond a little more to single signs for cueing, demonstrated by interpreter and then repeated by SLP. Note that chart says pt has a h/o blindness but she did appear to react to use of ASL and she did  appear to anticipate presentation of spoon/other things in her visual field. SLP and RN also attempted to reposition pt, but her positioning in bed might also be impacting swallowing function, as pt has contractures favoring her L side and cannot be positioned with a more neutral position of her neck. She does seem to be very eager for water and swallowed it fairly automatically, but any time she got more than a small, single sip, there was resultant coughing. This is concerning for aspiration particularly in the setting of PNA. Pt was also given bites of puree but despite multiple trials and repeated cues, pt orally held purees and did not swallow until thin liquids were presented. She does not appear to be ready for a PO diet. Additional instrumental testing to look at oropharyngeal function would be helpful but with her positioning, I think it would be very challenging, if even possible, to visualize her swallowing function (certainly not with MBS, likely at least very limited with FEES). Will plan to f/u when [place] interpreter can be obtained to see what else we can try clinically as her mentation hopefully also starts to clear toward her baseline. SLP Visit Diagnosis: Dysphagia, unspecified (R13.10)    Aspiration Risk  Moderate aspiration risk;Severe aspiration risk;Risk for inadequate nutrition/hydration    Diet Recommendation NPO   Medication Administration: Via alternative means    Other  Recommendations Oral Care Recommendations: Oral care QID Other Recommendations: Have oral suction  available    Recommendations for follow up therapy are one component of a multi-disciplinary discharge planning process, led by the attending physician.  Recommendations may be updated based on patient status, additional functional criteria and insurance authorization.  Follow up Recommendations  (tba)      Assistance Recommended at Discharge    Functional Status Assessment Patient has had a recent  decline in their functional status and demonstrates the ability to make significant improvements in function in a reasonable and predictable amount of time.  Frequency and Duration min 2x/week  2 weeks       Prognosis Prognosis for Safe Diet Advancement: Fair (question baseline level of function)      Swallow Study   General HPI: Pt is an 88 yo pt presenting 11/15 with hypernatremia and AMS. Hospital course is complicated with chronic fever of unclear course, now being treated with broad spectrum antibiotics for possible PNA. PMH includes: PD, congenital deafness, congenital deafness, nonambulatory, dementia, aphasia, alcohol abuse, CKD Type of Study: Bedside Swallow Evaluation Previous Swallow Assessment: none in chart Diet Prior to this Study: NPO Temperature Spikes Noted: No Respiratory Status: Room air History of Recent Intubation: No Behavior/Cognition: Alert;Cooperative;Requires cueing Oral Cavity Assessment: Within Functional Limits Oral Care Completed by SLP: No Oral Cavity - Dentition: Edentulous Vision:  (blind per chart, but appears to see things in front of her - do question if she is seeing the interpreter on the iPad though) Self-Feeding Abilities: Total assist Patient Positioning: Postural control interferes with function Baseline Vocal Quality: Normal    Oral/Motor/Sensory Function     Ice Chips Ice chips: Not tested   Thin Liquid Thin Liquid: Impaired Presentation: Straw Pharyngeal  Phase Impairments: Suspected delayed Swallow;Cough - Immediate;Cough - Delayed    Nectar Thick Nectar Thick Liquid: Not tested   Honey Thick Honey Thick Liquid: Not tested   Puree Puree: Impaired Presentation: Spoon Oral Phase Functional Implications: Oral holding Pharyngeal Phase Impairments: Unable to trigger swallow   Solid     Solid: Not tested      [name]., M.A. [place] Office [phone]  Secure chat preferred  03/13/2022,11:37  AM

## 2022-03-13 NOTE — Progress Notes (Signed)
FMTS Brief Progress Note  S:Patient was seen in bed this PM, Her daughter was also in the room. Daughter notes the patient is more alert today but feels patient is not at her baseline. Daughter says the patient was able to communicate "I love you" earlier to her today. Patient was opening her eyes and able to track some movement.   O: BP (!) 130/90 (BP Location: Right Arm)   Pulse 83   Temp 98.2 F (36.8 C) (Axillary)   Resp 20   Ht 5\' 2"  (1.575 m)   Wt 42.7 kg   SpO2 97%   BMI 17.22 kg/m    General: Pleasant, well-appearing in bed. No acute distress. More alert today but difficult to assess if she is able to respond to stimuli as she is deaf and poor vision. CV: RRR. No murmurs, rubs, or gallops.  Pulmonary: Lungs CTAB. Poor effort.    A/P:  * Hypernatremia Improved to 143 this AM.  -Plan per day team  AMS Appears improved, more alert today. -Continue treating hypernatremia and AKI -Bedrest -NPO   Alesia Morin, MD 03/13/2022, 8:51 PM PGY-1, [place] Night Resident  Please page [phone] with questions.

## 2022-03-13 NOTE — Assessment & Plan Note (Addendum)
Patient with home benztropine for Parkinson's movements  - Continue benztropine now that status has improved

## 2022-03-13 NOTE — Progress Notes (Signed)
Speech Language Pathology Treatment: Dysphagia  Patient Details Name: [name] MRN: [id number] DOB: [birth year] Today's Date: 03/13/2022 Time: 2297-9892 SLP Time Calculation (min) (ACUTE ONLY): 16 min  Assessment / Plan / Recommendation Clinical Impression  RN notified SLP that pt's son, [name], had arrived and was looking for updates from providers. SLP provided education to son in terms of results and recommendations from clinical swallow eval earlier today. Discussed the coughing noted with thin liquids as well as the oral holding with purees, that was concerning for trouble swallowing and suggestive that she is still not quite ready to resume a PO diet. [name] shared initially that his mom does not have any difficulty swallowing at baseline, but as conversation continued, he remembers that she is actually on a modified diet that includes pureed foods and thickened liquids. She was not working with an SLP PTA. He was not sure to which consistency her liquids were thickened, and did try to call the facility to clarify but was not able to get a hold of staff. He said that he will obtain this information for me for tomorrow. Note that [name] also prefers to be present for swallowing therapy tomorrow and prefers that he be used to assist in facilitating language barrier. He does not want SLP to schedule an in-person interpreter at this time. Will f/u for ongoing dysphagia treatment.    HPI HPI: Pt is an 88 yo pt presenting 11/15 with hypernatremia and AMS. Hospital course is complicated with chronic fever of unclear course, now being treated with broad spectrum antibiotics for possible PNA. PMH includes: PD, congenital deafness, congenital deafness, nonambulatory, dementia, aphasia, alcohol abuse, CKD      SLP Plan  Continue with current plan of care      Recommendations for follow up therapy are one component of a multi-disciplinary discharge planning process, led by the attending physician.   Recommendations may be updated based on patient status, additional functional criteria and insurance authorization.    Recommendations  Diet recommendations: NPO Medication Administration: Via alternative means                Oral Care Recommendations: Oral care QID Follow Up Recommendations: Other (comment) (tba) Assistance recommended at discharge: Frequent or constant Supervision/Assistance SLP Visit Diagnosis: Dysphagia, unspecified (R13.10) Plan: Continue with current plan of care           [name]., M.A. [place] Office [phone]  Secure chat preferred   03/13/2022, 3:06 PM

## 2022-03-13 NOTE — Progress Notes (Signed)
Pharmacy Antibiotic Note  [name] is a 88 y.o. female admitted on 03/09/2022 with pneumonia.  Patient AKI on CKD with prerenal AKI in setting of dehydration. Nephro following. Patient weight 42.7 kg. Pharmacy has been consulted for vancomycin and cefepime dosing.  Dosing vancomycin per unstable renal function to be determined by levels. Given vancomycin 750 mg x1 yesterday 11/18 PM. Random of 15 this AM. Will need additional dose of vancomycin to achieve therapeutic levels. 11/17 MRSA nares from positive with 11/17 bcx x2 NGTD x2 days. Afebrile with wbc wnl. Renal function slowly improving. Per family medicine, to continue vancomycin and cefepime for a total duration of 5 days.   Plan: Give vancomycin 750 mg IV x1 Continue cefepime 1g IV q24h Obtain vanc random 11/20 AM to assess if additional dose needed  Monitor renal function, culture results, clinical status and resolution of s/sx infection  Height: 5\' 2"  (157.5 cm) Weight: 42.7 kg (94 lb 2.2 oz) IBW/kg (Calculated) : 50.1  Temp (24hrs), Avg:98.7 F (37.1 C), Min:98.5 F (36.9 C), Max:98.9 F (37.2 C)  Recent Labs  Lab 03/09/22 1645 03/09/22 2227 03/10/22 0429 03/10/22 1337 03/11/22 0444 03/11/22 0950 03/12/22 0726 03/12/22 0933 03/12/22 1533 03/12/22 2222 03/13/22 0113  WBC 9.4  --  9.8  --  8.5  --  7.0  --   --   --  5.9  CREATININE 4.53* 4.17* 4.35*   < > 4.40*   < > 3.82* 3.74* 3.45* 3.35* 3.23*  LATICACIDVEN 1.2 1.8  --   --   --   --   --   --   --   --   --   VANCORANDOM  --   --   --   --   --   --  10  --   --   --  15   < > = values in this interval not displayed.    Estimated Creatinine Clearance: 8.3 mL/min (A) (by C-G formula based on SCr of 3.23 mg/dL (H)).    No Known Allergies   Thank you for allowing pharmacy to be a part of this patient's care.   Gena Fray, PharmD PGY1 Pharmacy Resident   03/13/2022 8:57 AM  [facility] pharmacy phone numbers are listed on [email or web address]

## 2022-03-14 ENCOUNTER — Inpatient Hospital Stay (HOSPITAL_COMMUNITY): Payer: Medicare Other

## 2022-03-14 DIAGNOSIS — R633 Feeding difficulties, unspecified: Secondary | ICD-10-CM

## 2022-03-14 DIAGNOSIS — R627 Adult failure to thrive: Secondary | ICD-10-CM | POA: Diagnosis not present

## 2022-03-14 DIAGNOSIS — I472 Ventricular tachycardia, unspecified: Secondary | ICD-10-CM | POA: Diagnosis not present

## 2022-03-14 DIAGNOSIS — R509 Fever, unspecified: Secondary | ICD-10-CM | POA: Diagnosis not present

## 2022-03-14 DIAGNOSIS — A419 Sepsis, unspecified organism: Secondary | ICD-10-CM

## 2022-03-14 DIAGNOSIS — E870 Hyperosmolality and hypernatremia: Secondary | ICD-10-CM | POA: Diagnosis not present

## 2022-03-14 DIAGNOSIS — R4182 Altered mental status, unspecified: Secondary | ICD-10-CM | POA: Diagnosis not present

## 2022-03-14 DIAGNOSIS — M7989 Other specified soft tissue disorders: Secondary | ICD-10-CM

## 2022-03-14 DIAGNOSIS — R6520 Severe sepsis without septic shock: Secondary | ICD-10-CM

## 2022-03-14 LAB — CBC
HCT: 32.4 % — ABNORMAL LOW (ref 36.0–46.0)
Hemoglobin: 10.1 g/dL — ABNORMAL LOW (ref 12.0–15.0)
MCH: 33.2 pg (ref 26.0–34.0)
MCHC: 31.2 g/dL (ref 30.0–36.0)
MCV: 106.6 fL — ABNORMAL HIGH (ref 80.0–100.0)
Platelets: 250 10*3/uL (ref 150–400)
RBC: 3.04 MIL/uL — ABNORMAL LOW (ref 3.87–5.11)
RDW: 13.1 % (ref 11.5–15.5)
WBC: 6.1 10*3/uL (ref 4.0–10.5)
nRBC: 0 % (ref 0.0–0.2)

## 2022-03-14 LAB — BASIC METABOLIC PANEL
Anion gap: 16 — ABNORMAL HIGH (ref 5–15)
Anion gap: 6 (ref 5–15)
BUN: 70 mg/dL — ABNORMAL HIGH (ref 8–23)
BUN: 65 mg/dL — ABNORMAL HIGH (ref 8–23)
CO2: 19 mmol/L — ABNORMAL LOW (ref 22–32)
CO2: 26 mmol/L (ref 22–32)
Calcium: 9.4 mg/dL (ref 8.9–10.3)
Calcium: 9.3 mg/dL (ref 8.9–10.3)
Chloride: 111 mmol/L (ref 98–111)
Chloride: 111 mmol/L (ref 98–111)
Creatinine, Ser: 2.77 mg/dL — ABNORMAL HIGH (ref 0.44–1.00)
Creatinine, Ser: 2.58 mg/dL — ABNORMAL HIGH (ref 0.44–1.00)
GFR, Estimated: 16 mL/min — ABNORMAL LOW (ref >60)
GFR, Estimated: 17 mL/min — ABNORMAL LOW (ref >60)
Glucose, Bld: 76 mg/dL (ref 70–99)
Glucose, Bld: 114 mg/dL — ABNORMAL HIGH (ref 70–99)
Potassium: 4 mmol/L (ref 3.5–5.1)
Potassium: 3.7 mmol/L (ref 3.5–5.1)
Sodium: 146 mmol/L — ABNORMAL HIGH (ref 135–145)
Sodium: 143 mmol/L (ref 135–145)

## 2022-03-14 LAB — CULTURE, BLOOD (ROUTINE X 2): Culture: NO GROWTH

## 2022-03-14 LAB — FOLATE: Folate: 15.3 ng/mL (ref >5.9)

## 2022-03-14 LAB — VITAMIN B12: Vitamin B-12: 1165 pg/mL — ABNORMAL HIGH (ref 180–914)

## 2022-03-14 LAB — VANCOMYCIN, RANDOM: Vancomycin Rm: 21 ug/mL

## 2022-03-14 MED ORDER — CHLORHEXIDINE GLUCONATE CLOTH 2 % EX PADS
6.0000 | MEDICATED_PAD | Freq: Every day | CUTANEOUS | Status: DC
  Administered 2022-03-14 – 2022-03-16 (×3): 6 via TOPICAL

## 2022-03-14 MED ORDER — SODIUM CHLORIDE 0.9 % IV SOLN: 3.0000 g | INTRAVENOUS | Status: DC

## 2022-03-14 MED ORDER — MUPIROCIN 2 % EX OINT
1.0000 | TOPICAL_OINTMENT | Freq: Two times a day (BID) | CUTANEOUS | Status: DC
  Administered 2022-03-14 – 2022-03-16 (×5): 1 via NASAL
  Filled 2022-03-14 (×2): qty 22

## 2022-03-14 MED ORDER — ACETAMINOPHEN 10 MG/ML IV SOLN
1000.0000 mg | Freq: Once | INTRAVENOUS | Status: AC | PRN
  Administered 2022-03-14: 1000 mg via INTRAVENOUS
  Filled 2022-03-14: qty 100

## 2022-03-14 MED ORDER — LACTATED RINGERS IV SOLN: INTRAVENOUS | Status: AC

## 2022-03-14 NOTE — Assessment & Plan Note (Addendum)
Ate 90% of 2/3 meals yesterday. Continue feeding despite SLP eval and recommendation for NPO per families wishes, they understand risks.  - Continue pureed meals with assistance

## 2022-03-14 NOTE — Progress Notes (Signed)
FMTS Brief Interval Progress Note  Dr. Larae Grooms and I called patient's son to update him regarding SLP eval, ID evaluation, nutrition. We explained to patient's son that patient has probably been chronically aspirating and might not have pneumonia, but aspiration pneumonitis. Reported that ID recommended discontinuing antibiotics as aspiration events are chronic. We explained that aspiration can happen regardless of feeding, but is worsened by feeding. We also discussed that SLP did not recommend feeding as she choked on more than thin liquids. Also discussed that we need to start a conversation regarding patient's nutrition and that next steps may be a core track though we do not recommend this. At this time, the son  told us this was a lot of information for one discussion and would like to continue the conversation at a later time when he comes to the hospital.   Lowry Ram MD  PGY - [place]

## 2022-03-14 NOTE — Progress Notes (Signed)
PT Screen Note  Patient Details Name: JOICE NAZARIO MRN: [id number] DOB: [birth year]   Screen Treatment:    Reason Eval/Treat Not Completed: PT screened, no needs identified, will sign off  Pt is from nursing home with hospice following. Pt is cachetic and contracted and bedbound baseline.  Pt at baseline and has no acute PT needs.  Will sign off.  Abran Richard, PT Acute Rehab [facility] [phone]  Karlton Lemon 03/14/2022, 11:10 AM

## 2022-03-14 NOTE — Progress Notes (Signed)
   Chart reviewed including vital signs, progress notes, labs.  In agreement with primary team and infectious disease regarding chronic aspiration due to underlying Parkinson's and dementia.  Artificial nutrition would likely cause more harm than benefit, as there is no evidence that this improves nutritional status or overall prognosis in patients with dementia.  Conversely, there is well-documented evidence that artificial nutrition in patients with dementia leads to worsening quality of life and increased risk of complications. Burdens of the procedure and the feedings themselves include but are not limited to pain, GI symptoms such as diarrhea and reflux, increased risk of pressure injuries, and use of physical and chemical restraints to prevent patients from pulling out feeding tube.  Recommend risk feeding by hand alternatively for comfort and pleasure.  I have discussed this with primary team.  Son is participating in [place] discussions with patient's primary physicians per his preference.  PMT will continue to follow peripherally. Thank you for your referral and allowing PMT to assist in [name]'s care.   Dorthy Cooler, [facility] Palliative Medicine Team  Team Phone # [phone]   NO CHARGE

## 2022-03-14 NOTE — Progress Notes (Signed)
VASCULAR LAB    Right upper extremity venous duplex has been performed.  See CV proc for preliminary results.   Jessie Schrieber, RVT 03/14/2022, 12:43 PM

## 2022-03-14 NOTE — Consult Note (Signed)
[facility] for Infectious Disease    Date of Admission:  03/09/2022     Reason for Consult: fever     Referring Provider: Owens Shark    Lines:  Peripheral iv's Foley just removed within past 24 hours as of 03/14/22  Abx: 11/20 amp-sulbactam  11/17-18; 11/19 cefepime 11/18 ceftriaxone 11/17-19 vancomycin        Assessment: 88 yo female severely comorbid, former hospice patient, bedbound, congenital deafness, legally blind, parkinsonism, failure to thrive, severe dementia, body flexion contracture, ckd 4, admitted 11/15 for ams with severe dehydration, course complicated by isolated intermittent fever   Patient is unable to provide any history. Baseline mentation/interaction is severely minimal.   Currently and this admission has been on room air.   Hypernatremic/aki at presentation due to a chronic poor PO intake  Not needing supplemental o2 and cxr rather stable with left retrocardiac opacity/basilar atelectasis without frank pneumonia   Her dementia status/parkinsonism/likely aspiration risk and poor volitional intake all bode extremely poor prognosis within the next few months  Her fever I agree is probably pneumonitis.   There has been several studies that review abx use in end of life, and treatment of bsi or pneumonia doesn't seem to add to any quality of life  In this setting, I would have extremely high threshold to use any abx at all after appropriate rigorous diagnostics  I agree with ongoing discussion for hospice    Plan: Stop abx Agree with focus of care on comfort/palliative I would have high threshold (if family want to treat for particular infectious syndrome) to treat with abx after appropriate supportive diagnostics as indicated Will sign off Discussed with primary team     I spent 75 minute reviewing data/chart, and coordinating care and >50% direct face to face time providing counseling/discussing diagnostics/treatment plan with  patient    ------------------------------------------------ Principal Problem:   Hypernatremia Active Problems:   Pressure injury of skin   AKI (acute kidney injury) ([place])   Goals of care, counseling/discussion   Intramural hematoma of thoracic aorta (HCC)   Hypertension   Altered mental status   Severe sepsis ([place])   Fever   Parkinson disease   V tach ([place])   Feeding difficulty    HPI: [name] is a 88 y.o. female severely comorbid, former hospice patient, bedbound, congenital deafness, legally blind, parkinsonism, failure to thrive, severe dementia, body flexion contracture, ckd 4, admitted 11/15 for ams with severe dehydration, course complicated by isolated intermittent fever   Patient is unable to provide any history. Baseline mentation/interaction is severely minimal.   Chart reviewed Discussed with team/nursing staff  Patient had ams (limited baseline however in terms of cognition/physical functioning)  She has had intermittent fever here but stable hemodynamics and no leukocytosis and no o2 supplementation Admission bcx negative Mrsa nares cx positive 11/17 repeat bcx negative Xray serially showed stable left retrocardiac opacity/basilar opacity Fever despite iv abx   Presenting aki/hypernatremia improves with iv fluid. Intake remains limited, complicated by her legally blind/congenital deaf status and also parkinsonism/flexion contracture  Previously on hospice  Ongoing goals of care discussion    Family History  Family history unknown: Yes    Social History   Tobacco Use   Smoking status: Former   Smokeless tobacco: Never  Scientific laboratory technician Use: Never used  Substance Use Topics   Alcohol use: Not Currently   Drug use: No    No Known Allergies  Review of Systems: ROS All Other ROS was negative, except mentioned above   Past Medical History:  Diagnosis Date   Alcohol abuse    Anemia    Aphasia    Cataract    CKD (chronic  kidney disease)    CKD (chronic kidney disease), stage IV ([place])    Deaf    Deaf, bilateral    Dementia ([place])    Dementia ([place])    Diabetes mellitus    Dry eyes    Hypercholesteremia    Hyperlipidemia    Hypertension        Scheduled Meds:  benztropine  0.5 mg Oral BID   Chlorhexidine Gluconate Cloth  6 each Topical Q0600   heparin injection (subcutaneous)  5,000 Units Subcutaneous Q8H   leptospermum manuka honey  1 Application Topical Daily   mupirocin ointment  1 Application Nasal BID   Continuous Infusions:  lactated ringers 100 mL/hr at 03/14/22 1126   PRN Meds:.acetaminophen **OR** acetaminophen, neomycin-bacitracin-polymyxin   OBJECTIVE: Blood pressure (!) 146/98, pulse (!) 125, temperature 97.8 F (36.6 C), temperature source Axillary, resp. rate 20, height 5\' 2"  (1.575 m), weight 42.7 kg, SpO2 97 %.  Physical Exam  General/constitutional: no distress, pleasant, bedbound, tremors chronic HEENT: Normocephalic, PER, Conj Clear, EOMI, Oropharynx clear Neck supple CV: rrr no mrg Lungs: clear to auscultation, normal respiratory effort Abd: Soft, Nontender Ext: no edema Skin: right cheek slight maceration healing well; per staff present on admission sacral ulcer Neuro: flexion contraction, tremors, rigid extremities, no clonus MSK: no peripheral joint swelling/tenderness/warmth     Lab Results Lab Results  Component Value Date   WBC 6.1 03/14/2022   HGB 10.1 (L) 03/14/2022   HCT 32.4 (L) 03/14/2022   MCV 106.6 (H) 03/14/2022   PLT 250 03/14/2022    Lab Results  Component Value Date   CREATININE 2.77 (H) 03/14/2022   BUN 70 (H) 03/14/2022   NA 146 (H) 03/14/2022   K 4.0 03/14/2022   CL 111 03/14/2022   CO2 19 (L) 03/14/2022    Lab Results  Component Value Date   ALT 10 03/09/2022   AST 17 03/09/2022   ALKPHOS 77 03/09/2022   BILITOT 0.6 03/09/2022      Microbiology: Recent Results (from the past 240 hour(s))  Urine Culture     Status: None    Collection Time: 03/09/22  3:52 PM   Specimen: In/Out Cath Urine  Result Value Ref Range Status   Specimen Description IN/OUT CATH URINE  Final   Special Requests NONE  Final   Culture   Final    NO GROWTH Performed at [facility], [address], [place], [place] [address]    Report Status 03/11/2022 FINAL  Final  Blood Culture (routine x 2)     Status: None   Collection Time: 03/09/22  4:35 PM   Specimen: BLOOD  Result Value Ref Range Status   Specimen Description BLOOD SITE NOT SPECIFIED  Final   Special Requests   Final    BOTTLES DRAWN AEROBIC AND ANAEROBIC Blood Culture results may not be optimal due to an inadequate volume of blood received in culture bottles   Culture   Final    NO GROWTH 5 DAYS Performed at [facility], [place] [address], [place], [place] [address]    Report Status 03/14/2022 FINAL  Final  Resp Panel by RT-PCR (Flu A&B, Covid) Anterior Nasal Swab     Status: None   Collection Time: 03/09/22  8:56 PM   Specimen: Anterior Nasal Swab  Result Value Ref Range Status   SARS Coronavirus 2 by RT PCR NEGATIVE NEGATIVE Final    Comment: (NOTE) SARS-CoV-2 target nucleic acids are NOT DETECTED.  The SARS-CoV-2 RNA is generally detectable in upper respiratory specimens during the acute phase of infection. The lowest concentration of SARS-CoV-2 viral copies this assay can detect is 138 copies/mL. A negative result does not preclude SARS-Cov-2 infection and should not be used as the sole basis for treatment or other patient management decisions. A negative result may occur with  improper specimen collection/handling, submission of specimen other than nasopharyngeal swab, presence of viral mutation(s) within the areas targeted by this assay, and inadequate number of viral copies(<138 copies/mL). A negative result must be combined with clinical observations, patient history, and epidemiological information. The expected result is Negative.  Fact Sheet  for Patients:  EntrepreneurPulse.com.au  Fact Sheet for Healthcare Providers:  IncredibleEmployment.be  This test is no t yet approved or cleared by the [place] FDA and  has been authorized for detection and/or diagnosis of SARS-CoV-2 by FDA under an Emergency Use Authorization (EUA). This EUA will remain  in effect (meaning this test can be used) for the duration of the COVID-19 declaration under Section 564(b)(1) of the Act, 21 U.S.C.section 360bbb-3(b)(1), unless the authorization is terminated  or revoked sooner.       Influenza A by PCR NEGATIVE NEGATIVE Final   Influenza B by PCR NEGATIVE NEGATIVE Final    Comment: (NOTE) The Xpert Xpress SARS-CoV-2/FLU/RSV plus assay is intended as an aid in the diagnosis of influenza from Nasopharyngeal swab specimens and should not be used as a sole basis for treatment. Nasal washings and aspirates are unacceptable for Xpert Xpress SARS-CoV-2/FLU/RSV testing.  Fact Sheet for Patients: EntrepreneurPulse.com.au  Fact Sheet for Healthcare Providers: IncredibleEmployment.be  This test is not yet approved or cleared by the [place] FDA and has been authorized for detection and/or diagnosis of SARS-CoV-2 by FDA under an Emergency Use Authorization (EUA). This EUA will remain in effect (meaning this test can be used) for the duration of the COVID-19 declaration under Section 564(b)(1) of the Act, 21 U.S.C. section 360bbb-3(b)(1), unless the authorization is terminated or revoked.  Performed at [facility], [place] [address] [place], [place] [address]   MRSA Next Gen by PCR, Nasal     Status: Abnormal   Collection Time: 03/11/22  1:48 AM   Specimen: Nasal Mucosa; Nasal Swab  Result Value Ref Range Status   MRSA by PCR Next Gen DETECTED (A) NOT DETECTED Final    Comment: RESULT CALLED TO, READ BACK BY AND VERIFIED WITH: RN SHEREE DAILEY ON 03/11/22 @  1806 BY DRT (NOTE) The GeneXpert MRSA Assay (FDA approved for NASAL specimens only), is one component of a comprehensive MRSA colonization surveillance program. It is not intended to diagnose MRSA infection nor to guide or monitor treatment for MRSA infections. Test performance is not FDA approved in patients less than 26 years old. Performed at [facility], [place] [address] [place],  [address]   Urine Culture     Status: None   Collection Time: 03/11/22  9:13 AM   Specimen: Urine, Clean Catch  Result Value Ref Range Status   Specimen Description URINE, CLEAN CATCH  Final   Special Requests NONE  Final   Culture   Final    NO GROWTH Performed at [facility], [place],  [place] [address]    Report Status 03/12/2022 FINAL  Final  Culture, blood (Routine X 2) w Reflex to ID Panel     Status: None (Preliminary result)   Collection Time: 03/11/22  9:50 AM   Specimen: BLOOD RIGHT HAND  Result Value Ref Range Status   Specimen Description BLOOD RIGHT HAND  Final   Special Requests   Final    BOTTLES DRAWN AEROBIC ONLY Blood Culture adequate volume   Culture   Final    NO GROWTH 3 DAYS Performed at [facility], [address] [place], [place] [address]    Report Status PENDING  Incomplete  Culture, blood (Routine X 2) w Reflex to ID Panel     Status: None (Preliminary result)   Collection Time: 03/11/22  9:50 AM   Specimen: BLOOD RIGHT HAND  Result Value Ref Range Status   Specimen Description BLOOD RIGHT HAND  Final   Special Requests IN PEDIATRIC BOTTLE Blood Culture adequate volume  Final   Culture   Final    NO GROWTH 3 DAYS Performed at [facility], [place] [address] [place], [place] [address]    Report Status PENDING  Incomplete     Serology:    Imaging: If present, new imagings (plain films, ct scans, and mri) have been personally visualized and interpreted; radiology reports have been reviewed. Decision making  incorporated into the Impression / Recommendations.  11/15 chest ct 1. Thin crescentic hyperdensity along the course of the thoracic aorta extending from approximately the level of the distal arch to at least the aortic hiatus. Appearance suggests an intramural hematoma. Cardiothoracic consultation and further evaluation with CT angiogram is recommended to assess for aortic dissection. 2. Trace left pleural effusion with minimal dependent airspace consolidation, which may represent atelectasis or pneumonia. 3. Severe degenerative changes of both glenohumeral joints with extensive osseous remodeling. There are moderate-large bilateral glenohumeral joint effusions, nonspecific. 4. 5 mm subpleural nodule at the right lung base. No follow-up needed if patient is low-risk.This recommendation follows the consensus statement: Guidelines for Management of Incidental Pulmonary Nodules Detected on CT Images: From the Fleischner Society 2017; Radiology 2017; 284:228-243. 5. Aortic and coronary artery atherosclerosis  11/20 cxr Persistent small left pleural effusion and left lower lobe atelectasis or pneumonia.    Jabier Mutton, [facility] for Infectious [place] [phone] pager    03/14/2022, 11:53 AM

## 2022-03-14 NOTE — Progress Notes (Signed)
Called patient's son (Mr. Elizebeth Koller) to follow-up with the results of the right upper extremity ultrasound.  I informed son the RUE doppler was negative and showed no clot. Unclear cause of the swellings at this time however we will continue to monitor this closely.  He was understanding and appreciated the follow-up.  Alen Bleacher, MD PGY-2, [facility] Family Medicine Resident  Please page [phone] with questions.

## 2022-03-14 NOTE — Progress Notes (Addendum)
Speech Language Pathology Treatment: Dysphagia  Patient Details Name: [name] MRN: [id number] DOB: 1932-09-09 Today's Date: 03/14/2022 Time: 6712-4580 SLP Time Calculation (min) (ACUTE ONLY): 52 min  Assessment / Plan / Recommendation Clinical Impression  Pt is a little more awake than yesterday and is still eager for POs. Her son, Bland Span, is present to participate in education, assist with interpreting, and feeding pt. Several questions were answered as well about what aspiration is, what aspiration PNA is, and how swallowing is involved. Education was provided as able.   Bland Span shared that pt was on pureed foods and nectar thick liquids at [facility] over the past year, so started with these consistencies. No overt coughing was noted with nectar thick liquids and cough x1 at the end of purees. WIth pureed, pt would clear her mouth of purees but would only swallow once every ~3 boluses. Bland Span also wanted to offer her some more to drink after this, so he gave her some of the honey thick liquids that SLP had brought to the room with one delayed cough as well.   Discussed potential options with pt and the potential for aspiration of secretions considering her difficulty with thin liquids. We discussed other risk factors that she has more chronically that may put her at risk for dysphagia related adverse events. Bland Span acknowledges that his mom is going to remain at risk for aspiration, but he wants to do whatever he feels like he can do to reduce her risk. He asks specifically about doing an x-ray test (MBS). I discussed my reservations about MBS and FEES given pt's contracted state. I find it very likely that we will not be able to see anything on her imaging because of her head positioned downward to the left, if we can keep her in the chair. We did discuss starting a diet at bedside and trying to reduce the risk as much as possible. Bland Span says that pt is typically in  wheelchair at [facility] and she is  more upright, so he does not want to start a diet until we at least try. If we are not able to see anything on MBS, he is agreeable to starting a diet with known risk. MBS scheduled for tomorrow at 130:00 so he can be present.   UPDATE: discussed with Dr. Owens Shark this afternoon after the MD team had a chance to speak with Bland Span as well. Bland Span is now leaning more toward wanting pt to be able to eat for comfort. In light of this decision, team is looking for a recommendation for a comfort focused diet, for which I would recommend Dys 1 diet and nectar thick liquids, which is her baseline diet. Would use careful hand feeding and aspiration precautions to reduce but not eliminate risk. If the decision is shifting more toward comfort, encourage reconsideration of MBS on next date. Dr. Owens Shark said that Bland Span is already doing so and she will notify SLP on next date about his ultimate decision.   HPI HPI: Pt is an 88 yo pt presenting 11/15 with hypernatremia and AMS. Hospital course is complicated with chronic fever of unclear course, now being treated with broad spectrum antibiotics for possible PNA. PMH includes: PD, congenital deafness, congenital deafness, nonambulatory, dementia, aphasia, alcohol abuse, CKD      SLP Plan  MBS      Recommendations for follow up therapy are one component of a multi-disciplinary discharge planning process, led by the attending physician.  Recommendations may be updated based on patient  status, additional functional criteria and insurance authorization.    Recommendations  Diet recommendations: NPO Medication Administration: Via alternative means                Oral Care Recommendations: Oral care QID Follow Up Recommendations: Other (comment) (tba) Assistance recommended at discharge: Frequent or constant Supervision/Assistance SLP Visit Diagnosis: Dysphagia, unspecified (R13.10) Plan: MBS           [name]., M.A. [place] Office [phone]  Secure chat preferred   03/14/2022, 2:01 PM

## 2022-03-14 NOTE — Progress Notes (Signed)
FMTS Brief Note  In to see  patient and son Samina Weekes with Dr. Adah Salvage to discuss updates. Discussed ongoing fevers and ID recommendations to discontinue antibiotics. Discussed that fluids had been off for a short time and sodium immediately rose. Discussed RUE edema and need for ultrasound. Specifically discussed diet considerations, risks of aspiration including worsening respiratory status and subsequent complications.  Discussed overall trajectory and concerns that patient may not return to prior baseline, likely to have continued aspiration events and electrolyte abnormalities. Discussed the difference between comfort measures and aggressive care.   Plan based upon discussion - Allow patient to eat and drink per diet orders from SNF(spoke with SLP Mickel Baas , updated diet). Son is aware of risks including worsening respiratory status including death. Patient is DNR/DNI (only wants ACLS medications).  - Continue IV antibiotics for now--son aware these are likely not prolonging life or improving quality of life. Fevers likely represent pneumonitis.  - Discussed IV fluids--continue for now. Discussed that it seems unlikely patient will be able to maintain sodium off of fluids. Trial diet as above. Will check electrolytes tomorrow AM. Son in agreement that patient would NOT want Cortrak or another feeding tube. We will not pursue either of these interventions.  - Son is going to think about 1.) MBS (may cancel test tomorrow) 2.) antibiotics 3.) ongoing IV fluids  - Will follow up with son results of DVT ultrasound   Dorris Singh, MD  [facility] Medicine Teaching Service

## 2022-03-14 NOTE — Progress Notes (Signed)
Daily Progress Note Intern Pager: [phone]  Patient name: [name] record number: [id number] Date of birth: 08/11/1932 Age: 88 y.o. Gender: female  Primary Care Provider: Lajean Saver, NP Consultants: Palliative Care, Nephrology, SLP, PT, OT Code Status: Partial, ok for medications, no chest compressions or intubation   Pt Overview and Major Events to Date:  11/14 - Admitted  11/17 - Started on vancomycin and cefepime for pneumonia with risk factors for MDR 11/19- de-escalated to ceftriaxone+ vancomycin  11/20 - re-escalated to vancomycin + cefepime   Assessment and Plan: [name] a 88 year old female presented with hypernatremia and altered mental status.  Hospital course is complicated with chronic fever of unclear course now being treated with broad spectrum antibiotics for possible MDR pneumonia.   Pertinent PMH/PSH includes congenital deafness, nonverbal, blind, nonambulatory, dementia.    * Hypernatremia Hypernatremia improved. Decreased fluids yesterday and patient slightly increased Na.  -Nephrology following, appreciate recs - Continue LR at above maintenance 100 mL/hr due to NPO  -Continue BMP checks BID - Monitor mental status, delirium precautions  Feeding difficulty Patient has not been able to eat due to altered mental status and chronic decline. SLP eval showed aspiration with anything more than thin fluids. Continued to recommend NPO. Most likely chronically aspirating.  - Discuss feeding with family as patient most likely became hypernatremic due to decreased feeding and dehydration also is chronically aspirating and cannot tolerate more than thin liquids.  - Continue IVF at 100 mL/hr  V tach [facility]) Patient was in [place] throughout the night. Most likely because she is critically ill.  - electrolytes within normal range, will continue to replete as appropriate  - Continue telemetry   Parkinson disease Patient with home benztropine for  Parkinson's movements  - Continue benztropine now that status has started to improve.   Severe sepsis ([place]) Afebrile currently. Source still presumed PNA. MRSA positive. Bcx NGTD; however, patient fevered when de-escalated from cefepime to rocephin. Presumed MDR pneumonia as she has recently been treated multiple times with antibiotics.  - Now on vancomycin and cefepime  - Consult ID due to unsure source  -Monitor fever curve with routine vitals  Altered mental status Patient appeared to have intermittent AMS at baseline per report from SNF.  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  Surgical intervention not indicated.  Goals of care, counseling/discussion Goals of care as still ongoing conversation with son.  Yesterday reports that he still wants his mom to be DNI, with partial code and no chest compressions.  Sounds and goal is for his mom to return to [facility] she which is her baseline.  AKI (acute kidney injury) ([place]) Prerenal AKI in the setting of dehydration.  AKI improving with fluids . -Nephrology following, appreciate recs - Start LR back at above maintenance 100 mL/hr given no po  - Trend BMP BID - Avoid nephrotoxic agents as able, including IV contrast  - Hold home torsemide   Pressure injury of skin Noted multiple pressure ulcers on admission that did not appear infectious. - Continue wound care - Continue on air mattress  -Encourage frequent repositioning    FEN/GI: N.p.o., IVF at LR 80 mils per hour PPx: Heparin Dispo: Remain admitted, pending clinical improvement  Subjective:  Patient smiled and nodded as I asked her questions.  Objective: Temp:  [98.2 F (36.8 C)-100.8 F (38.2 C)] 98.2 F (36.8 C) (11/20 0810) Pulse Rate:  [44-89] 44 (11/20  0810) Resp:  [18-22] 22 (11/20 0810) BP: (128-139)/(85-109) 139/109 (11/20 0810) SpO2:  [93 %-99 %] 96 % (11/20 0810) Physical Exam: General: Chronically ill-appearing, however improved in mental status Cardiovascular: RRR, pulses equal and palpable, very edematous RUE not pitting, warm, grimace to touch  Respiratory: Normal work of breathing on room air  Abdomen: Soft, non tender, non distended  Extremities: No edema in bilateral lower extremities   Laboratory: Most recent CBC Lab Results  Component Value Date   WBC 6.1 03/14/2022   HGB 10.1 (L) 03/14/2022   HCT 32.4 (L) 03/14/2022   MCV 106.6 (H) 03/14/2022   PLT 250 03/14/2022   Most recent BMP    Latest Ref Rng & Units 03/14/2022    7:47 AM  BMP  Glucose 70 - 99 mg/dL 76   BUN 8 - 23 mg/dL 70   Creatinine 0.44 - 1.00 mg/dL 2.77   Sodium 135 - 145 mmol/L 146   Potassium 3.5 - 5.1 mmol/L 4.0   Chloride 98 - 111 mmol/L 111   CO2 22 - 32 mmol/L 19   Calcium 8.9 - 10.3 mg/dL 9.4     Other pertinent labs B12 wnl, Folate wnl    Lowry Ram, MD 03/14/2022, 11:09 AM  PGY-1, [place] Intern pager: [phone], text pages welcome Secure chat group [place]

## 2022-03-14 NOTE — Progress Notes (Signed)
FMTS Interim Progress Note  Patient intermittent febrile and still source is unknown. Consulted ID and spoke with Dr. Baxter Flattery. She is concerned for possible aspiration pneumonia, she will evaluate the patient and place her recommendations. Updated her on patient's hospitalization and ongoing goals of care discussions, son and family would like to continue to treat but avoid aggressive treatments. Greatly appreciate Dr. Storm Frisk input and recommendations.   Donney Dice, DO 03/14/2022, 8:52 AM PGY-3, [place] Medicine Service pager [phone]

## 2022-03-15 ENCOUNTER — Inpatient Hospital Stay (HOSPITAL_COMMUNITY): Payer: Medicare Other

## 2022-03-15 DIAGNOSIS — E43 Unspecified severe protein-calorie malnutrition: Secondary | ICD-10-CM | POA: Diagnosis present

## 2022-03-15 DIAGNOSIS — J690 Pneumonitis due to inhalation of food and vomit: Secondary | ICD-10-CM | POA: Diagnosis present

## 2022-03-15 DIAGNOSIS — N179 Acute kidney failure, unspecified: Secondary | ICD-10-CM | POA: Diagnosis not present

## 2022-03-15 DIAGNOSIS — E870 Hyperosmolality and hypernatremia: Secondary | ICD-10-CM | POA: Diagnosis not present

## 2022-03-15 LAB — BASIC METABOLIC PANEL
Anion gap: 11 (ref 5–15)
BUN: 67 mg/dL — ABNORMAL HIGH (ref 8–23)
CO2: 21 mmol/L — ABNORMAL LOW (ref 22–32)
Calcium: 9 mg/dL (ref 8.9–10.3)
Chloride: 111 mmol/L (ref 98–111)
Creatinine, Ser: 2.69 mg/dL — ABNORMAL HIGH (ref 0.44–1.00)
GFR, Estimated: 17 mL/min — ABNORMAL LOW (ref >60)
Glucose, Bld: 112 mg/dL — ABNORMAL HIGH (ref 70–99)
Potassium: 4.3 mmol/L (ref 3.5–5.1)
Sodium: 143 mmol/L (ref 135–145)

## 2022-03-15 LAB — CBC
HCT: 29.6 % — ABNORMAL LOW (ref 36.0–46.0)
Hemoglobin: 9.4 g/dL — ABNORMAL LOW (ref 12.0–15.0)
MCH: 33.3 pg (ref 26.0–34.0)
MCHC: 31.8 g/dL (ref 30.0–36.0)
MCV: 105 fL — ABNORMAL HIGH (ref 80.0–100.0)
Platelets: 235 10*3/uL (ref 150–400)
RBC: 2.82 MIL/uL — ABNORMAL LOW (ref 3.87–5.11)
RDW: 12.9 % (ref 11.5–15.5)
WBC: 6 10*3/uL (ref 4.0–10.5)
nRBC: 0 % (ref 0.0–0.2)

## 2022-03-15 MED ORDER — LACTATED RINGERS IV SOLN: INTRAVENOUS | Status: DC

## 2022-03-15 MED ORDER — ADULT MULTIVITAMIN W/MINERALS CH
1.0000 | ORAL_TABLET | Freq: Every day | ORAL | Status: DC
  Administered 2022-03-16: 1 via ORAL
  Filled 2022-03-15: qty 1

## 2022-03-15 NOTE — Progress Notes (Signed)
Brief Palliative Medicine Progress Note:  PMT following peripherally for needs/decline.  Medical records reviewed including progress notes, labs, imaging. Noted anticipated discharge is tomorrow. Son is still in agreement with patient returning to [facility] with continued hospice support. He wishes to pursue comfort feeds with known risk of aspiration. Careful hand feeding with aspiration precautions has been shown to provide the best quality of life in patient's with advanced dementia.  PMT will continue to follow peripherally. If there are any imminent needs please call the service directly. Family also has PMT contact information should further needs arise.  Son is participating in [place] discussions with attending team per his preference.  Thank you for allowing PMT to assist in the care of this patient.  Avana Kreiser M. Tamala Julian [facility] Palliative Medicine Team Team Phone: [phone] NO CHARGE

## 2022-03-15 NOTE — Progress Notes (Signed)
FMTS Interim Progress Note  Went to bedside to check on patient and son also present at bedside. Had an extensive discussion with her progression during this hospitalization. Son believes that his mother looked better yesterday compared to today but he is content that she is more alert than prior days. We discussed discontinuing IV fluids, patient has been eating about 90% of her puree meals. I explained that we want to ensure that she can maintain her sodium level within normal limits, he is agreeable to this and discontinuing fluids. I also explained to him that we have done an extensive workup regarding her intermittent febrile episodes and placed on her a lot of antibiotics up until this point. The ID specialist believes that she has gotten sufficient coverage and recommends not treating without a clear diagnosis or source at this point as they believe it may be due to aspiration pneumonia or pneumonitis. I explained that these are likely chronic at this stage and it likely will not alter her overall clinical picture or be beneficial to her long-term which he also agreed with. He is also agreeable to discontinuing antibiotics. Orders adjusted accordingly. I assured him that we will continue to update him on the status of his mother and offered support as I recognize that this is a difficult time to process all these changes. He was appreciative of the updates and discussion.   Donney Dice, DO 03/15/2022, 6:34 PM PGY-3, [place] Medicine Service pager [phone]

## 2022-03-15 NOTE — Progress Notes (Signed)
OT Cancellation Note  Patient Details Name: NASIAH POLINSKY MRN: [id number] DOB: February 09, 1933   Cancelled Treatment:    Reason Eval/Treat Not Completed: OT screened, no needs identified, will sign off. Pt from SNF with hospice following.  Pt is contracted and per RN total care for ADLs and mobility.  Pt at baseline and no acute needs identified.  OT will sign off.  Jolaine Artist, OT Acute Rehabilitation Services Office [phone]   Delight Stare 03/15/2022, 10:09 AM

## 2022-03-15 NOTE — Progress Notes (Signed)
SLP Cancellation Note  Patient Details Name: [name] MRN: [id number] DOB: 28-Jul-1932   Cancelled treatment:       Reason Eval/Treat Not Completed: Other (comment) SLP called pt's son, [name], this morning to touch base regarding plan for comfort feedings and/or MBS. [name] shared that he had been doing a lot of thinking, and that he believed that MBS would likely be challenging and perhaps not change the plan of care, and so he wanted to cancel this test. He would like to continue with current comfort feedings and says that SLP f/u for education is not needed at this time. SLP therefore planning to sign off but can be available with any new needs. [name] made aware and in agreement.     [name]., M.A. [place] Office [phone]  Secure chat preferred  03/15/2022, 11:13 AM

## 2022-03-15 NOTE — Progress Notes (Addendum)
FMTS Attending Daily Note: Dorris Singh, MD  Team Pager [phone] Pager [phone]  I have seen and examined this patient, reviewed their chart. I have discussed this patient with the resident physician.  I agree with the remainder of the findings, exam, and plan below.   Disposition: pending PO intake, could discharge as early as 11/22 to SNF with hospice.        Daily Progress Note Intern Pager: [phone]  Patient name: [place] record number: [id number] Date of birth: [birth year] Age: 88 y.o. Gender: female  Primary Care Provider: Lajean Saver, NP Consultants: Nephrology, ID, Palliative  Code Status: Partial, Ok for medications, No CPR, No Intubation   Pt Overview and Major Events to Date:  11/14 - Admitted  11/17 - Started on vancomycin and cefepime for pneumonia with risk factors for MDR 11/19- de-escalated to ceftriaxone+ vancomycin  11/20 - re-escalated to vancomycin + cefepime  11/21 - off of abx   Assessment and Plan: Peyten Howat a 88 year old female presented with hypernatremia and altered mental status. Low grade fevers indicative of aspiration pneumonitis. Much improved with hypernatremia resolved.  Pertinent PMH/PSH includes congenital deafness, nonverbal, blind, nonambulatory, dementia.    * Hypernatremia-resolved as of 03/15/2022 Mostly resolved. Continue to monitor  -Nephrology following, appreciate recs -Continue BMP checks BID - Monitor mental status, delirium precautions  Aspiration pneumonitis ([place]) Most likely having chronic aspiration events due to dementia. ID recommends discontinuing antibiotics. Son understands risks of feeding but would like to  continue feeds.  - Discuss discontinuing antibiotics with patient's son.   Feeding difficulty SLP eval showed aspiration with anything more than thin fluids. Continued to recommend NPO. However, patient's son wants to continue feeds understands risks.Most likely chronically aspirating.  - LR  at half maintenance as patient is feeding. Ate 90% of last two meals    V tach (HCC) Shorter runs of Vtach overnight. Continue to monitor.  - electrolytes within normal range, will continue to replete as appropriate  - Continue telemetry   Parkinson disease Patient with home benztropine for Parkinson's movements  - Continue benztropine now that status has started to improve.   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  Surgical intervention not indicated.  Goals of care, counseling/discussion Goals of care as still ongoing conversation with son. Understands that feeding will lead to more aspirations but wants to continue feeds. Goal is for his mom to return to [facility] SNF she which is her baseline. - Discuss abx   AKI (acute kidney injury) ([place]) Improving.  -Nephrology following, appreciate recs - Start LR at half maintenance 50 mL/hr as patient starts to eat more  - Trend BMP BID - Avoid nephrotoxic agents as able, including IV contrast  - Hold home torsemide   Pressure injury of skin Noted multiple pressure ulcers on admission that did not appear infectious. - Continue wound care - Continue on air mattress  -Encourage frequent repositioning    FEN/GI: LR 50 mL/hr, pureed diet  PPx: heparin  Dispo:pending improvement .   Subjective:  Patient responds to touch.   Objective: Temp:  [97.6 F (36.4 C)-99 F (37.2 C)] 99 F (37.2 C) (11/21 1145) Pulse Rate:  [78-133] 89 (11/21 1145) Resp:  [18-22] 18 (11/21 1145) BP: (97-146)/(55-92) 114/74 (11/21 1145) SpO2:  [97 %-100 %] 98 % (11/21 1145) Physical Exam: General: Chronically ill-appearing, however improved in mental status Cardiovascular: RRR, pulses equal and palpable, very edematous  RUE not pitting, warm, grimace to touch  Respiratory: Normal work of breathing on room air  Abdomen: Soft, non tender, non distended   Extremities: No edema in bilateral lower extremities, upper extremities both edematous with 1+ pitting edema     Laboratory: Most recent CBC Lab Results  Component Value Date   WBC 6.0 03/15/2022   HGB 9.4 (L) 03/15/2022   HCT 29.6 (L) 03/15/2022   MCV 105.0 (H) 03/15/2022   PLT 235 03/15/2022   Most recent BMP    Latest Ref Rng & Units 03/15/2022   12:56 AM  BMP  Glucose 70 - 99 mg/dL 112   BUN 8 - 23 mg/dL 67   Creatinine 0.44 - 1.00 mg/dL 2.69   Sodium 135 - 145 mmol/L 143   Potassium 3.5 - 5.1 mmol/L 4.3   Chloride 98 - 111 mmol/L 111   CO2 22 - 32 mmol/L 21   Calcium 8.9 - 10.3 mg/dL 9.0     DVT US:  Negative for DVT in Konrad Saha, MD 03/15/2022, 1:30 PM  PGY-1, [place] Intern pager: [phone], text pages welcome Secure chat group [place]

## 2022-03-15 NOTE — TOC Initial Note (Signed)
Transition of Care [facility]) - Initial/Assessment Note    Patient Details  Name: [name] MRN: [id number] Date of Birth: [birth year]  [facility] CM/SW Contact:    Sierra Sill, LCSW Phone Number: 03/15/2022, 3:09 PM  Clinical Narrative:                  CSW spoke with patient's son, [name]- he confirmed patient is from [facility]. He is agreeable to the patient returning once medically stable.   [facility] confirmed bed availability.  Informed of anticipated d/c tomorrow.  TOC will continue to follow and assist with discharge planning.   [name], MSW, LCSW Clinical Social Worker    Expected Discharge Plan: Skilled Nursing Facility Barriers to Discharge: Continued Medical Work up   Patient Goals and CMS Choice        Expected Discharge Plan and Services Expected Discharge Plan: [place] In-house Referral: Clinical Social Work                                            Prior Living Arrangements/Services                       Activities of Daily Living Home Assistive Devices/Equipment: Wheelchair ADL Screening (condition at time of admission) Patient's cognitive ability adequate to safely complete daily activities?: No Is the patient deaf or have difficulty hearing?: Yes Does the patient have difficulty seeing, even when wearing glasses/contacts?: No Does the patient have difficulty concentrating, remembering, or making decisions?: Yes Patient able to express need for assistance with ADLs?: No Does the patient have difficulty dressing or bathing?: Yes Independently performs ADLs?: No Communication: Dependent Is this a change from baseline?: Change from baseline, expected to last >3 days Dressing (OT): Dependent Is this a change from baseline?: Pre-admission baseline Grooming: Dependent Is this a change from baseline?: Pre-admission baseline Feeding: Dependent Is this a change from baseline?:  Pre-admission baseline Bathing: Dependent Is this a change from baseline?: Pre-admission baseline Toileting: Dependent Is this a change from baseline?: Pre-admission baseline In/Out Bed: Dependent Is this a change from baseline?: Pre-admission baseline Walks in Home: Dependent Is this a change from baseline?: Pre-admission baseline Does the patient have difficulty walking or climbing stairs?: Yes Weakness of Legs: Both Weakness of Arms/Hands: Both  Permission Sought/Granted Permission sought to share information with : Other (comment) (talk with her son- paitent is not oriented X4)                Emotional Assessment           Psych Involvement: No (comment)  Admission diagnosis:  Hypernatremia [E87.0] AKI (acute kidney injury) ([place]) [N17.9] Fever, unspecified fever cause [R50.9] Altered mental status, unspecified altered mental status type [R41.82] Patient Active Problem List   Diagnosis Date Noted   Aspiration pneumonitis ([place]) 03/15/2022   Feeding difficulty 03/14/2022   Failure to thrive in adult 03/14/2022   Parkinson disease 03/13/2022   V tach ([place]) 03/13/2022   Fever 03/11/2022   Hypertension 03/10/2022   Goals of care, counseling/discussion 03/09/2022   Intramural hematoma of thoracic aorta ([place]) 03/09/2022   Pressure injury of skin 10/09/2019   AKI (acute kidney injury) ([place]) 10/09/2019   ARF (acute renal failure) ([place]) 10/07/2019   Hyperkalemia 10/07/2019   Swelling of joint of left shoulder 10/07/2019   Hypertensive urgency  10/07/2019   DM type 2 (diabetes mellitus, type 2) ([place]) 10/07/2019   Dementia ([place]) 10/07/2019   Acute renal failure (ARF) ([place]) 10/07/2019   PCP:  Lajean Saver, NP Pharmacy:   [place], [place] - [address] [place] [address] Phone: [phone] Fax: [phone]     Social Determinants of Health (SDOH) Interventions    Readmission Risk Interventions     No  data to display

## 2022-03-15 NOTE — Progress Notes (Signed)
Nutrition Follow-up  DOCUMENTATION CODES:   Underweight, Severe malnutrition in context of chronic illness  INTERVENTION:  Continue providing assistance with meals.  Provide Mighty Shake po TID with meals, each supplement provides 200 kcal and 7 grams of protein.  Provide Magic cup TID with meals, each supplement provides 290 kcal and 9 grams of protein.  Provide multivitamin daily.  NUTRITION DIAGNOSIS:   Severe Malnutrition related to chronic illness (CKD, dysphagia, unstageable pressure injury) as evidenced by severe fat depletion, severe muscle depletion.  New nutrition diagnosis.  GOAL:   Patient will meet greater than or equal to 90% of their needs  Progressing with diet advancement.  MONITOR:   PO intake, Supplement acceptance, Labs, Weight trends, Skin, I & O's  REASON FOR ASSESSMENT:   Malnutrition Screening Tool    ASSESSMENT:   88 year old female with PMHx of advanced dementia, HTN, CKD, DM admitted with hypernatremia, aspiration PNA, feeding difficulty, V-tach, AKI.  11/20: diet advanced to dysphagia 1 with nectar-thick liquids after goals of care discussion  Spoke with patient's son over the phone to obtain history. He reports pt has been on dysphagia 1 diet with nectar-thick liquids at baseline at facility. She typically receives pureed meat/protein, fruits, and vegetables at meals. At baseline she eats 100% of her trays. Over the past few weeks that had decreased to 25%  and several days PTA she did not have anything by mouth at all, which led to dehydration.  Pt is documented to have eaten 90% of breakfast and lunch today. She is receiving assistance with meals.  Son denies any unplanned weight loss. UBW around 90 lbs. Per review of chart pt was 59 kg in 2019. Suspect wt loss may have been more gradual over time.  Medications reviewed and include: LR at 50 mL/hour  Labs reviewed: BUN 67, Creatinine 2.69. Potassium and phosphorus WNL.  I/O: +3251 mL  since admission  UOP:  125 mL UOP yesterday  Team had goals of care discussion with patient's son. Diet was advanced to dysphagia 1 with nectar-thick liquids with known risk for aspiration. Son decided not to pursue MBS. Son would not want Cortrak tube or other enteral access for enteral nutrition.  NUTRITION - FOCUSED PHYSICAL EXAM:  Flowsheet Row Most Recent Value  Orbital Region Moderate depletion  Upper Arm Region Severe depletion  Thoracic and Lumbar Region Severe depletion  Buccal Region Severe depletion  Temple Region Moderate depletion  Clavicle Bone Region Severe depletion  Clavicle and Acromion Bone Region Severe depletion  Scapular Bone Region Unable to assess  Dorsal Hand Moderate depletion  Patellar Region Severe depletion  Anterior Thigh Region Severe depletion  Posterior Calf Region Severe depletion  Edema (RD Assessment) Mild  Hair Reviewed  Eyes Unable to assess  Mouth Unable to assess  Skin Reviewed  Nails Reviewed       Diet Order:   Diet Order             DIET - DYS 1 Room service appropriate? No; Fluid consistency: Nectar Thick  Diet effective now                  EDUCATION NEEDS:   Not appropriate for education at this time  Skin:  Skin Assessment: Skin Integrity Issues: Skin Integrity Issues:: Unstageable Unstageable: right hip  Last BM:  03/13/22 (no stool characteristics available)  Height:   Ht Readings from Last 1 Encounters:  03/09/22 5\' 2"  (1.575 m)   Weight:   Wt Readings  from Last 1 Encounters:  03/12/22 42.7 kg   Ideal Body Weight:  50 kg  BMI:  Body mass index is 17.22 kg/m.  Estimated Nutritional Needs:   Kcal:  1500-1700  Protein:  75-85 grams  Fluid:  1.5-1.7 L/day  Loanne Drilling, MS, RD, LDN, CNSC Pager number available on Amion

## 2022-03-15 NOTE — Assessment & Plan Note (Signed)
Most likely having chronic aspiration events due to dementia. ID recommends discontinuing antibiotics. Son understands risks of feeding but would like to  continue feeds.  - Discuss discontinuing antibiotics with patient's son.

## 2022-03-16 DIAGNOSIS — R627 Adult failure to thrive: Secondary | ICD-10-CM

## 2022-03-16 LAB — CULTURE, BLOOD (ROUTINE X 2)
Culture: NO GROWTH
Culture: NO GROWTH
Special Requests: ADEQUATE
Special Requests: ADEQUATE

## 2022-03-16 LAB — CBC
HCT: 33.1 % — ABNORMAL LOW (ref 36.0–46.0)
Hemoglobin: 10 g/dL — ABNORMAL LOW (ref 12.0–15.0)
MCH: 32.3 pg (ref 26.0–34.0)
MCHC: 30.2 g/dL (ref 30.0–36.0)
MCV: 106.8 fL — ABNORMAL HIGH (ref 80.0–100.0)
Platelets: 276 10*3/uL (ref 150–400)
RBC: 3.1 MIL/uL — ABNORMAL LOW (ref 3.87–5.11)
RDW: 13.2 % (ref 11.5–15.5)
WBC: 6.1 10*3/uL (ref 4.0–10.5)
nRBC: 0 % (ref 0.0–0.2)

## 2022-03-16 LAB — BASIC METABOLIC PANEL
Anion gap: 11 (ref 5–15)
Anion gap: 12 (ref 5–15)
BUN: 63 mg/dL — ABNORMAL HIGH (ref 8–23)
BUN: 63 mg/dL — ABNORMAL HIGH (ref 8–23)
CO2: 20 mmol/L — ABNORMAL LOW (ref 22–32)
CO2: 20 mmol/L — ABNORMAL LOW (ref 22–32)
Calcium: 9 mg/dL (ref 8.9–10.3)
Calcium: 8.9 mg/dL (ref 8.9–10.3)
Chloride: 111 mmol/L (ref 98–111)
Chloride: 109 mmol/L (ref 98–111)
Creatinine, Ser: 2.37 mg/dL — ABNORMAL HIGH (ref 0.44–1.00)
Creatinine, Ser: 2.45 mg/dL — ABNORMAL HIGH (ref 0.44–1.00)
GFR, Estimated: 19 mL/min — ABNORMAL LOW (ref >60)
GFR, Estimated: 19 mL/min — ABNORMAL LOW (ref >60)
Glucose, Bld: 134 mg/dL — ABNORMAL HIGH (ref 70–99)
Glucose, Bld: 113 mg/dL — ABNORMAL HIGH (ref 70–99)
Potassium: 4.3 mmol/L (ref 3.5–5.1)
Potassium: 4.3 mmol/L (ref 3.5–5.1)
Sodium: 142 mmol/L (ref 135–145)
Sodium: 141 mmol/L (ref 135–145)

## 2022-03-16 MED ORDER — CULTURELLE PO CAPS: 1.0000 | ORAL_CAPSULE | Freq: Every day | ORAL | Status: AC

## 2022-03-16 MED ORDER — MUPIROCIN 2 % EX OINT: 1.0000 | TOPICAL_OINTMENT | Freq: Two times a day (BID) | CUTANEOUS | 0 refills | Status: AC

## 2022-03-16 MED ORDER — TORSEMIDE 10 MG PO TABS: 10.0000 mg | ORAL_TABLET | ORAL | Status: AC | PRN

## 2022-03-16 MED ORDER — MEDIHONEY WOUND/BURN DRESSING EX PSTE: 1.0000 | PASTE | Freq: Every day | CUTANEOUS | Status: AC

## 2022-03-16 NOTE — Discharge Instructions (Addendum)
Thank you for letting us take care of your mom!  She was admitted for hypernatremia or elevated sodium.  This is most likely due to her not eating that much and being dehydrated at her skilled nursing facility.  This could also be acutely worsened because of her being recently sick with pneumonia.  We treated her with fluids and antibiotics during her stay.  We consulted infectious disease which was able to give [place] information that she most likely had chronic inflammation in her lungs rather than pneumonia at this time.  This inflammation is due to her chronically aspirating.  This means that due to her dementia she has limited control of protecting her airway and sometimes the contents of her stomach go into her airway and irritate her lungs.  Since she has been able to eat her meals and maintain her sodium without fluids she is safe to go home.  Please reach out or seek medical attention if she has mental status changes again or seems more drowsy somnolent and is not reacting.

## 2022-03-16 NOTE — Discharge Summary (Addendum)
[facility] Discharge Summary  Patient name: [name] record number: [id number] Date of birth: 01-04-1933 Age: 88 y.o. Gender: female Date of Admission: 03/09/2022  Date of Discharge: 03/16/2022 Admitting Physician: Darci Current, DO  Primary Care Provider: Lajean Saver, NP Consultants: Nephrology, ID, Palliative Care  Indication for Hospitalization: Hypernatremia requiring IVF   Discharge Diagnoses/Problem List:  Principal Problem for Admission: Hypernatremia Other Problems addressed during stay:  Active Problems:   Pressure injury of skin   AKI (acute kidney injury) ([place])   Goals of care, counseling/discussion   Intramural hematoma of thoracic aorta (HCC)   Hypertension   Fever   Parkinson disease   V tach ([place])   Feeding difficulty   Failure to thrive in adult   Protein-calorie malnutrition, severe   Brief Hospital Course:  [name] is a 88 y.o. female presenting with hypernatremia. Past medical history significant for congenital deafness, blindness, hyperlipidemia, hypertension, CKD stage IV, diabetes.  Patient is currently on hospice at [facility].  Her hospital course is outlined below:  Hypernatremia: On presentation patient was found to be altered and although has had steady decline in health in the last several months,report fron SNF and son show her mental status was more altered than her baseline. Her initial lab show hypernatremia with Na at 162 likely secondary to dehydration in the setting of poor PO intake. She was started on normal saline IV fluid, CCM was consulted and recommended started D5 maintenance fluids.  Later she was continued with lactated Ringer's.  Sodium was not reduced more than 12 meq in 24 hours. With fluid resuscitation patient sodium improved and mental status improved as well. She restarted feeding on 11/20. She was corrected slowly over 5 days and at discharge her sodium was 141. She was able to  maintain her sodium without fluids consuming >90% of meals.   AKI: On admission her creatinine was noted to be 4.5.  Her baseline creatinine appears to be less than 2.  She was started on IV fluids which improved her AKI. She was able to maintain po intake off of IVF. Her creatinine at discharge was 2.45.  Goals of Care  Talked to patient's son extensively regarding goals of care given hypernatremia from most likely dehydration and low po intake, chronic aspiration pneumonitis. Patient's son who is POA decided to continue feeds, discontinue IVF and antibiotics. She was feeding and eating most of her meals by time of discharge. She is returning to SNF with hospice. Palliative was consulted during admission.   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  Incidentally found on ED chest CT.  Cardiothoracic surgery was consulted and recommended close blood pressure control but declined surgical intervention as patient is not a good candidate. Monitored blood pressure closely during admission with goal of within 140/90.   Disposition: to SNF  Discharge Condition: stable  Issues for Follow Up:   PCP follow-up: Monitor for po intake and check sodium. Repeat BMP in 5 days.  Do not treat low grade fever, most likely aspiration pneumonitis  Restarting torsemide prn on discharge to manage upper extremity edema.  Continue discussion around goals of care--patient at very high risk of readmission given her chronic progressive Parkinson's and other conditions   Discharge Exam:  Vitals:   03/16/22 0310 03/16/22 0725  BP: (!) 148/65 121/80  Pulse: 88 86  Resp: 17 18  Temp: 98.5 F (36.9 C) 98.2 F (36.8 C)  SpO2: 94% 95%   General:  stable appearing Neuro: Alert, tremulous, but responding to touch  CV: RRR, palpable pulses, 1+ pitting edema in bilateral upper extremity  Resp: No increased work of breathing on room air  Abd: Soft, non distended   Significant Procedures: swallow study, shows  aspiration with more than thin fluids   Significant Labs and Imaging:  Recent Labs  Lab 03/15/22 0056 03/16/22 0153  WBC 6.0 6.1  HGB 9.4* 10.0*  HCT 29.6* 33.1*  PLT 235 276   Recent Labs  Lab 03/14/22 1825 03/15/22 0056 03/16/22 0046 03/16/22 0755  NA 143 143 142 141  K 3.7 4.3 4.3 4.3  CL 111 111 111 109  CO2 26 21* 20* 20*  GLUCOSE 114* 112* 134* 113*  BUN 65* 67* 63* 63*  CREATININE 2.58* 2.69* 2.37* 2.45*  CALCIUM 9.3 9.0 9.0 8.9    Chest Xray  IMPRESSION: Persistent small left pleural effusion and left lower lobe atelectasis or pneumonia.    Results/Tests Pending at Time of Discharge: None  Discharge Medications:  Allergies as of 03/16/2022   No Known Allergies      Medication List     STOP taking these medications    cloNIDine 0.2 MG tablet Commonly known as: CATAPRES   cloNIDine 0.3 MG tablet Commonly known as: CATAPRES   diphenhydrAMINE 25 mg capsule Commonly known as: BENADRYL   doxycycline 100 MG tablet Commonly known as: VIBRA-TABS   furosemide 20 MG tablet Commonly known as: LASIX   silver sulfADIAZINE 1 % cream Commonly known as: SILVADENE   traZODone 50 MG tablet Commonly known as: DESYREL       TAKE these medications    acetaminophen 650 MG suppository Commonly known as: TYLENOL Place 650 mg rectally every 6 (six) hours as needed for mild pain or fever.   ALBUTEROL SULFATE IN Take 1 vial by nebulization every other day.   benztropine 0.5 MG tablet Commonly known as: COGENTIN Take 0.5 mg by mouth 2 (two) times daily.   Combigan 0.2-0.5 % ophthalmic solution Generic drug: brimonidine-timolol Place 1 drop into both eyes 2 (two) times daily.   Culturelle Caps Take 1 capsule by mouth daily for 9 days.   diclofenac Sodium 1 % Gel Commonly known as: VOLTAREN Apply 2 g topically 2 (two) times daily. Left shoulder   eucerin lotion Apply 1 Application topically See admin instructions. Apply to feet topically every  day shift every 2 days for dry feet. Apply to clean feet and cover with socks.   ferrous sulfate 325 (65 FE) MG tablet Take 325 mg by mouth daily.   hydrocortisone cream 1 % Apply 1 Application topically daily. Apply to face around eyes topically one time a day for periorbital dermatitis for 14 days to cleansed face.   Hyoscyamine Sulfate SL 0.125 MG Subl Place 0.125 mg under the tongue every 4 (four) hours as needed (excess secretions).   Ilevro 0.3 % ophthalmic suspension Generic drug: nepafenac Place 1 drop into the left eye every other day.   leptospermum manuka honey Pste paste Apply 1 Application topically daily. Start taking on: March 17, 2022   mirtazapine 7.5 MG tablet Commonly known as: REMERON Take 7.5 mg by mouth at bedtime.   morphine 20 MG/ML concentrated solution Commonly known as: ROXANOL Take 0.25 mLs by mouth every 4 (four) hours as needed for severe pain or shortness of breath.   mupirocin ointment 2 % Commonly known as: BACTROBAN Place 1 Application into the nose 2 (two) times daily.   neomycin-bacitracin-polymyxin  [phone] ointment Apply 1 Application topically daily. Apply to 2nd toe on left foot until 03/11/22   OXYGEN Inhale 2 L/min into the lungs every hour as needed (keep sats>90%).   Pataday 0.1 % ophthalmic solution Generic drug: olopatadine Place 1 drop into both eyes 2 (two) times daily.   sennosides-docusate sodium 8.6-50 MG tablet Commonly known as: SENOKOT-S Take 2 tablets by mouth daily.   torsemide 10 MG tablet Commonly known as: DEMADEX Take 1 tablet (10 mg total) by mouth as needed (for extremity swelling). What changed:  when to take this reasons to take this   XEROFORM PETROLATUM DRESSING EX Apply 1 Application topically See admin instructions. Apply to left lower leg topically every day shift every 2 days for wound healing.        Discharge Instructions: Please refer to Patient Instructions section of EMR for full  details.  Patient was counseled important signs and symptoms that should prompt return to medical care, changes in medications, dietary instructions, activity restrictions, and follow up appointments.    Lowry Ram, MD 03/16/2022, 10:25 AM PGY-1, [place] [facility]  I was personally present and re-performed the exam. I verified the service and findings are accurately documented in the intern's note.  Alen Bleacher, MD 03/16/2022 2:07 PM

## 2022-03-16 NOTE — Progress Notes (Signed)
Brief Palliative Medicine Progress Note:  PMT following peripherally for needs/decline.  Medical records reviewed including progress notes, labs, imaging. Patient remains medically stable for discharge with plan to return to [place] with hospice today. Discussed with TOC coordinating patient's hospice re-enrollment with [facility]. It is recommend she is re-enrolled sooner than later for needed support for her anticipated EOL decline. TOC to assist in coordinating/notifying [facility] of her discharge.  PMT will sign off. If there are any imminent needs please call the service directly. Family also has PMT contact information should further needs arise.  Son is participating in [place] discussions with attending team per his preference.   Thank you for allowing PMT to assist in the care of this patient.  Paz Winsett M. Tamala Julian [facility] Palliative Medicine Team Team Phone: [phone] NO CHARGE

## 2022-03-16 NOTE — Progress Notes (Signed)
Daily Progress Note Intern Pager: [phone]  Patient name: [name] record number: [id number] Date of birth: 11-12-1932 Age: 88 y.o. Gender: female  Primary Care Provider: Lajean Saver, NP Consultants: Palliative, ID  Code Status: Partial, ok for medications, no CPR or Intubation   Pt Overview and Major Events to Date:  11/14 - Admitted  11/17 - Started on vancomycin and cefepime for pneumonia with risk factors for MDR 11/19- de-escalated to ceftriaxone+ vancomycin  11/20 - re-escalated to vancomycin + cefepime  11/21 - off of abx   Assessment and Plan: [name] a 88 year old female presented with hypernatremia and altered mental status. Much improved mental status. Hypernatremia resolved. Pending disposition.  Pertinent PMH/PSH includes congenital deafness, nonverbal, blind, nonambulatory, dementia.    * Hypernatremia-resolved as of 03/15/2022 Resolved. Off of IVF. Continue to monitor through today to see if she can maintain Na off IVF.  -Nephrology following, appreciate recs - BID BMP today and BMP tomorrow AM  - Monitor mental status, delirium precautions  Feeding difficulty Ate 90% of 2/3 meals yesterday. Continue feeding despite SLP eval and recommendation for NPO per families wishes, they understand risks.  - Continue pureed meals with assistance   V tach ([place]) Shorter runs of Vtach overnight. Continue to monitor.  - electrolytes within normal range, will continue to replete as appropriate  - Continue telemetry   Parkinson disease Patient with home benztropine for Parkinson's movements  - Continue benztropine now that status has improved  Intramural hematoma of thoracic aorta [facility]) CT Surgery assessment that patient has chronic intramural hematoma of the thoracic aorta.  Surgical intervention not indicated. - Control BP, goal < 140/90  Goals of care, counseling/discussion Goal is for his mom to return to [facility] with patient  feeding. Son understands risks of aspiration and long term prognosis. Agreed to discontinuing IVF and abx.   AKI (acute kidney injury) ([place]) Improving. Cr 2.37 this AM. Now off of LR.  -Nephrology following, appreciate recs - Trend BMP BID today and BMP tomorrow AM.  - Avoid nephrotoxic agents as able, including IV contrast  - Hold home torsemide   Pressure injury of skin Noted multiple pressure ulcers on admission that did not appear infectious. - Continue wound care - Continue on air mattress  -Encourage frequent repositioning   FEN/GI: Pureed  meals, off IVF  PPx: heparin  Dispo:SNF tomorrow pending monitoring of Na.   Subjective:  Patient alert and responded to touch.   Objective: Temp:  [98.2 F (36.8 C)-99 F (37.2 C)] 98.2 F (36.8 C) (11/22 0725) Pulse Rate:  [84-95] 86 (11/22 0725) Resp:  [14-20] 18 (11/22 0725) BP: (114-148)/(65-80) 121/80 (11/22 0725) SpO2:  [89 %-98 %] 95 % (11/22 0725) Physical Exam: General: stable appearing Neuro: Alert, tremulous, but responding to touch  CV: RRR, palpable pulses, 1+ pitting edema in bilateral upper extremity  Resp: No increased work of breathing on room air  Abd: Soft, non distended   Laboratory: Most recent CBC Lab Results  Component Value Date   WBC 6.1 03/16/2022   HGB 10.0 (L) 03/16/2022   HCT 33.1 (L) 03/16/2022   MCV 106.8 (H) 03/16/2022   PLT 276 03/16/2022   Most recent BMP    Latest Ref Rng & Units 03/16/2022   12:46 AM  BMP  Glucose 70 - 99 mg/dL 134   BUN 8 - 23 mg/dL 63   Creatinine 0.44 - 1.00 mg/dL 2.37   Sodium 135 - 145 mmol/L 142  Potassium 3.5 - 5.1 mmol/L 4.3   Chloride 98 - 111 mmol/L 111   CO2 22 - 32 mmol/L 20   Calcium 8.9 - 10.3 mg/dL 9.0     Sierra Ram, MD 03/16/2022, 8:36 AM  PGY-1, [place] Intern pager: [phone], text pages welcome Secure chat group [place]

## 2022-03-16 NOTE — TOC Transition Note (Addendum)
Transition of Care [facility]) - CM/SW Discharge Note   Patient Details  Name: [name] MRN: [id number] Date of Birth: 1932/09/24  [facility] CM/SW Contact:  Vinie Sill, LCSW Phone Number: 03/16/2022, 12:50 PM   Clinical Narrative:     Patient will Discharge to: [facility] Discharge Date: 03/16/2022 Family Notified: son Transport By: Corey Harold Informed SNF - will need to resume services w/ [facility]- SNF states they will coordinate services Per MD patient is ready for discharge. RN, patient, and facility notified of discharge. Discharge Summary sent to facility. RN given number for [phone], Room 102. Ambulance transport requested for patient.   Clinical Social Worker signing off.  Thurmond Butts, MSW, LCSW Clinical Social Worker    Final next level of care: Skilled Nursing Facility Barriers to Discharge: Barriers Resolved   Patient Goals and CMS Choice        Discharge Placement              Patient chooses bed at: [facility] Patient to be transferred to facility by: [place] Name of family member notified: son Patient and family notified of of transfer: 03/16/22  Discharge Plan and Services In-house Referral: Clinical Social Work                                   Social Determinants of Health (SDOH) Interventions     Readmission Risk Interventions     No data to display

## 2022-03-16 NOTE — Progress Notes (Signed)
Called report to the nurse at the [facility].  Lavenia Atlas, RN

## 2022-03-16 NOTE — Progress Notes (Signed)
FMTS Interim Progress Note  Spoke to son over the phone and provided him with update. Patient has now been off of IVF overnight and is eating about 90% of her meals, she has maintained her Na levels. She has also remained afebrile overnight and we discussed that even if she did become febrile, ID recommends not treating with antibiotics since she has already received broad spectrum coverage and will likely be due to chronic inflammation. I asked him about his hesitations for discharge to the hospice facility today and he says that he wants her to be monitored. I explained to him that we have monitored her throughout this hospital stay and we would not be doing any further interventions. Upon further conversation, son is agreeable to discharge to facility today and he just requests being called when she is ready to be discharged to the facility so he can come over to assist. I conveyed to him that it was a pleasure taking care of his mother and thanked him for allowing our medical team to do so.   Donney Dice, DO 03/16/2022, 9:46 AM PGY-3, [place] Medicine Service pager [phone]

## 2022-03-21 DIAGNOSIS — Z8673 Personal history of transient ischemic attack (TIA), and cerebral infarction without residual deficits: Secondary | ICD-10-CM | POA: Diagnosis not present

## 2022-03-21 DIAGNOSIS — I71019 Dissection of thoracic aorta, unspecified: Secondary | ICD-10-CM | POA: Diagnosis not present

## 2022-03-21 DIAGNOSIS — F809 Developmental disorder of speech and language, unspecified: Secondary | ICD-10-CM | POA: Diagnosis not present

## 2022-03-21 DIAGNOSIS — D631 Anemia in chronic kidney disease: Secondary | ICD-10-CM | POA: Diagnosis not present

## 2022-03-21 DIAGNOSIS — Z09 Encounter for follow-up examination after completed treatment for conditions other than malignant neoplasm: Secondary | ICD-10-CM | POA: Diagnosis not present

## 2022-03-21 DIAGNOSIS — H409 Unspecified glaucoma: Secondary | ICD-10-CM | POA: Diagnosis not present

## 2022-03-21 DIAGNOSIS — Z515 Encounter for palliative care: Secondary | ICD-10-CM | POA: Diagnosis not present

## 2022-03-21 DIAGNOSIS — Z7982 Long term (current) use of aspirin: Secondary | ICD-10-CM | POA: Diagnosis not present

## 2022-03-21 DIAGNOSIS — J690 Pneumonitis due to inhalation of food and vomit: Secondary | ICD-10-CM | POA: Diagnosis not present

## 2022-03-21 DIAGNOSIS — F39 Unspecified mood [affective] disorder: Secondary | ICD-10-CM | POA: Diagnosis not present

## 2022-03-21 DIAGNOSIS — E1122 Type 2 diabetes mellitus with diabetic chronic kidney disease: Secondary | ICD-10-CM | POA: Diagnosis not present

## 2022-03-21 DIAGNOSIS — Z87448 Personal history of other diseases of urinary system: Secondary | ICD-10-CM | POA: Diagnosis not present

## 2022-03-21 DIAGNOSIS — N184 Chronic kidney disease, stage 4 (severe): Secondary | ICD-10-CM | POA: Diagnosis not present

## 2022-03-21 DIAGNOSIS — I150 Renovascular hypertension: Secondary | ICD-10-CM | POA: Diagnosis not present

## 2022-03-25 DIAGNOSIS — E43 Unspecified severe protein-calorie malnutrition: Secondary | ICD-10-CM | POA: Diagnosis not present

## 2022-03-25 DIAGNOSIS — N184 Chronic kidney disease, stage 4 (severe): Secondary | ICD-10-CM | POA: Diagnosis not present

## 2022-03-25 DIAGNOSIS — F39 Unspecified mood [affective] disorder: Secondary | ICD-10-CM | POA: Diagnosis not present

## 2022-03-25 DIAGNOSIS — D500 Iron deficiency anemia secondary to blood loss (chronic): Secondary | ICD-10-CM | POA: Diagnosis not present

## 2022-03-25 DIAGNOSIS — E785 Hyperlipidemia, unspecified: Secondary | ICD-10-CM | POA: Diagnosis not present

## 2022-03-25 DIAGNOSIS — Z8616 Personal history of COVID-19: Secondary | ICD-10-CM | POA: Diagnosis not present

## 2022-03-25 DIAGNOSIS — G2409 Other drug induced dystonia: Secondary | ICD-10-CM | POA: Diagnosis not present

## 2022-03-25 DIAGNOSIS — E118 Type 2 diabetes mellitus with unspecified complications: Secondary | ICD-10-CM | POA: Diagnosis not present

## 2022-03-25 DIAGNOSIS — I129 Hypertensive chronic kidney disease with stage 1 through stage 4 chronic kidney disease, or unspecified chronic kidney disease: Secondary | ICD-10-CM | POA: Diagnosis not present

## 2022-03-25 DIAGNOSIS — F01511 Vascular dementia, unspecified severity, with agitation: Secondary | ICD-10-CM | POA: Diagnosis not present

## 2022-03-25 DIAGNOSIS — M1990 Unspecified osteoarthritis, unspecified site: Secondary | ICD-10-CM | POA: Diagnosis not present

## 2022-03-25 DIAGNOSIS — I679 Cerebrovascular disease, unspecified: Secondary | ICD-10-CM | POA: Diagnosis not present

## 2022-04-25 DIAGNOSIS — F39 Unspecified mood [affective] disorder: Secondary | ICD-10-CM | POA: Diagnosis not present

## 2022-04-25 DIAGNOSIS — I129 Hypertensive chronic kidney disease with stage 1 through stage 4 chronic kidney disease, or unspecified chronic kidney disease: Secondary | ICD-10-CM | POA: Diagnosis not present

## 2022-04-25 DIAGNOSIS — E118 Type 2 diabetes mellitus with unspecified complications: Secondary | ICD-10-CM | POA: Diagnosis not present

## 2022-04-25 DIAGNOSIS — G2409 Other drug induced dystonia: Secondary | ICD-10-CM | POA: Diagnosis not present

## 2022-04-25 DIAGNOSIS — M1990 Unspecified osteoarthritis, unspecified site: Secondary | ICD-10-CM | POA: Diagnosis not present

## 2022-04-25 DIAGNOSIS — D500 Iron deficiency anemia secondary to blood loss (chronic): Secondary | ICD-10-CM | POA: Diagnosis not present

## 2022-04-25 DIAGNOSIS — E43 Unspecified severe protein-calorie malnutrition: Secondary | ICD-10-CM | POA: Diagnosis not present

## 2022-04-25 DIAGNOSIS — E785 Hyperlipidemia, unspecified: Secondary | ICD-10-CM | POA: Diagnosis not present

## 2022-04-25 DIAGNOSIS — F01511 Vascular dementia, unspecified severity, with agitation: Secondary | ICD-10-CM | POA: Diagnosis not present

## 2022-04-25 DIAGNOSIS — N184 Chronic kidney disease, stage 4 (severe): Secondary | ICD-10-CM | POA: Diagnosis not present

## 2022-04-25 DIAGNOSIS — I679 Cerebrovascular disease, unspecified: Secondary | ICD-10-CM | POA: Diagnosis not present

## 2022-04-25 DIAGNOSIS — Z8616 Personal history of COVID-19: Secondary | ICD-10-CM | POA: Diagnosis not present

## 2022-05-03 DIAGNOSIS — L89154 Pressure ulcer of sacral region, stage 4: Secondary | ICD-10-CM | POA: Diagnosis not present

## 2022-05-04 DIAGNOSIS — Z79899 Other long term (current) drug therapy: Secondary | ICD-10-CM | POA: Diagnosis not present

## 2022-05-04 DIAGNOSIS — Z681 Body mass index (BMI) 19 or less, adult: Secondary | ICD-10-CM | POA: Diagnosis not present

## 2022-05-04 DIAGNOSIS — D6489 Other specified anemias: Secondary | ICD-10-CM | POA: Diagnosis not present

## 2022-05-04 DIAGNOSIS — Z789 Other specified health status: Secondary | ICD-10-CM | POA: Diagnosis not present

## 2022-05-04 DIAGNOSIS — Z66 Do not resuscitate: Secondary | ICD-10-CM | POA: Diagnosis not present

## 2022-05-04 DIAGNOSIS — H409 Unspecified glaucoma: Secondary | ICD-10-CM | POA: Diagnosis not present

## 2022-05-04 DIAGNOSIS — R627 Adult failure to thrive: Secondary | ICD-10-CM | POA: Diagnosis not present

## 2022-05-11 DIAGNOSIS — Z9189 Other specified personal risk factors, not elsewhere classified: Secondary | ICD-10-CM | POA: Diagnosis not present

## 2022-05-11 DIAGNOSIS — L89150 Pressure ulcer of sacral region, unstageable: Secondary | ICD-10-CM | POA: Diagnosis not present

## 2022-05-11 DIAGNOSIS — W19XXXA Unspecified fall, initial encounter: Secondary | ICD-10-CM | POA: Diagnosis not present

## 2022-05-11 DIAGNOSIS — H409 Unspecified glaucoma: Secondary | ICD-10-CM | POA: Diagnosis not present

## 2022-05-11 DIAGNOSIS — Z8673 Personal history of transient ischemic attack (TIA), and cerebral infarction without residual deficits: Secondary | ICD-10-CM | POA: Diagnosis not present

## 2022-05-11 DIAGNOSIS — Z515 Encounter for palliative care: Secondary | ICD-10-CM | POA: Diagnosis not present

## 2022-05-11 DIAGNOSIS — F809 Developmental disorder of speech and language, unspecified: Secondary | ICD-10-CM | POA: Diagnosis not present

## 2022-05-11 DIAGNOSIS — I71019 Dissection of thoracic aorta, unspecified: Secondary | ICD-10-CM | POA: Diagnosis not present

## 2022-05-11 DIAGNOSIS — J690 Pneumonitis due to inhalation of food and vomit: Secondary | ICD-10-CM | POA: Diagnosis not present

## 2022-05-11 DIAGNOSIS — I150 Renovascular hypertension: Secondary | ICD-10-CM | POA: Diagnosis not present

## 2022-05-11 DIAGNOSIS — N184 Chronic kidney disease, stage 4 (severe): Secondary | ICD-10-CM | POA: Diagnosis not present

## 2022-05-11 DIAGNOSIS — R54 Age-related physical debility: Secondary | ICD-10-CM | POA: Diagnosis not present

## 2022-05-11 DIAGNOSIS — G2401 Drug induced subacute dyskinesia: Secondary | ICD-10-CM | POA: Diagnosis not present

## 2022-05-11 DIAGNOSIS — R059 Cough, unspecified: Secondary | ICD-10-CM | POA: Diagnosis not present

## 2022-05-24 DIAGNOSIS — R532 Functional quadriplegia: Secondary | ICD-10-CM | POA: Diagnosis not present

## 2022-05-24 DIAGNOSIS — U071 COVID-19: Secondary | ICD-10-CM | POA: Diagnosis not present

## 2022-05-26 DIAGNOSIS — E118 Type 2 diabetes mellitus with unspecified complications: Secondary | ICD-10-CM | POA: Diagnosis not present

## 2022-05-26 DIAGNOSIS — E785 Hyperlipidemia, unspecified: Secondary | ICD-10-CM | POA: Diagnosis not present

## 2022-05-26 DIAGNOSIS — Z8616 Personal history of COVID-19: Secondary | ICD-10-CM | POA: Diagnosis not present

## 2022-05-26 DIAGNOSIS — M1990 Unspecified osteoarthritis, unspecified site: Secondary | ICD-10-CM | POA: Diagnosis not present

## 2022-05-26 DIAGNOSIS — I679 Cerebrovascular disease, unspecified: Secondary | ICD-10-CM | POA: Diagnosis not present

## 2022-05-26 DIAGNOSIS — F01511 Vascular dementia, unspecified severity, with agitation: Secondary | ICD-10-CM | POA: Diagnosis not present

## 2022-05-26 DIAGNOSIS — G2409 Other drug induced dystonia: Secondary | ICD-10-CM | POA: Diagnosis not present

## 2022-05-26 DIAGNOSIS — D500 Iron deficiency anemia secondary to blood loss (chronic): Secondary | ICD-10-CM | POA: Diagnosis not present

## 2022-05-26 DIAGNOSIS — F39 Unspecified mood [affective] disorder: Secondary | ICD-10-CM | POA: Diagnosis not present

## 2022-05-26 DIAGNOSIS — E43 Unspecified severe protein-calorie malnutrition: Secondary | ICD-10-CM | POA: Diagnosis not present

## 2022-05-26 DIAGNOSIS — N184 Chronic kidney disease, stage 4 (severe): Secondary | ICD-10-CM | POA: Diagnosis not present

## 2022-05-26 DIAGNOSIS — I129 Hypertensive chronic kidney disease with stage 1 through stage 4 chronic kidney disease, or unspecified chronic kidney disease: Secondary | ICD-10-CM | POA: Diagnosis not present

## 2022-05-30 DIAGNOSIS — D500 Iron deficiency anemia secondary to blood loss (chronic): Secondary | ICD-10-CM | POA: Diagnosis not present

## 2022-05-30 DIAGNOSIS — F39 Unspecified mood [affective] disorder: Secondary | ICD-10-CM | POA: Diagnosis not present

## 2022-05-30 DIAGNOSIS — N184 Chronic kidney disease, stage 4 (severe): Secondary | ICD-10-CM | POA: Diagnosis not present

## 2022-05-30 DIAGNOSIS — I129 Hypertensive chronic kidney disease with stage 1 through stage 4 chronic kidney disease, or unspecified chronic kidney disease: Secondary | ICD-10-CM | POA: Diagnosis not present

## 2022-05-30 DIAGNOSIS — E43 Unspecified severe protein-calorie malnutrition: Secondary | ICD-10-CM | POA: Diagnosis not present

## 2022-05-30 DIAGNOSIS — I679 Cerebrovascular disease, unspecified: Secondary | ICD-10-CM | POA: Diagnosis not present

## 2022-06-01 DIAGNOSIS — E43 Unspecified severe protein-calorie malnutrition: Secondary | ICD-10-CM | POA: Diagnosis not present

## 2022-06-01 DIAGNOSIS — F39 Unspecified mood [affective] disorder: Secondary | ICD-10-CM | POA: Diagnosis not present

## 2022-06-01 DIAGNOSIS — N184 Chronic kidney disease, stage 4 (severe): Secondary | ICD-10-CM | POA: Diagnosis not present

## 2022-06-01 DIAGNOSIS — I679 Cerebrovascular disease, unspecified: Secondary | ICD-10-CM | POA: Diagnosis not present

## 2022-06-01 DIAGNOSIS — D500 Iron deficiency anemia secondary to blood loss (chronic): Secondary | ICD-10-CM | POA: Diagnosis not present

## 2022-06-01 DIAGNOSIS — I129 Hypertensive chronic kidney disease with stage 1 through stage 4 chronic kidney disease, or unspecified chronic kidney disease: Secondary | ICD-10-CM | POA: Diagnosis not present

## 2022-06-02 DIAGNOSIS — F39 Unspecified mood [affective] disorder: Secondary | ICD-10-CM | POA: Diagnosis not present

## 2022-06-02 DIAGNOSIS — E43 Unspecified severe protein-calorie malnutrition: Secondary | ICD-10-CM | POA: Diagnosis not present

## 2022-06-02 DIAGNOSIS — N184 Chronic kidney disease, stage 4 (severe): Secondary | ICD-10-CM | POA: Diagnosis not present

## 2022-06-02 DIAGNOSIS — I679 Cerebrovascular disease, unspecified: Secondary | ICD-10-CM | POA: Diagnosis not present

## 2022-06-02 DIAGNOSIS — I129 Hypertensive chronic kidney disease with stage 1 through stage 4 chronic kidney disease, or unspecified chronic kidney disease: Secondary | ICD-10-CM | POA: Diagnosis not present

## 2022-06-02 DIAGNOSIS — D500 Iron deficiency anemia secondary to blood loss (chronic): Secondary | ICD-10-CM | POA: Diagnosis not present

## 2022-06-06 DIAGNOSIS — I129 Hypertensive chronic kidney disease with stage 1 through stage 4 chronic kidney disease, or unspecified chronic kidney disease: Secondary | ICD-10-CM | POA: Diagnosis not present

## 2022-06-06 DIAGNOSIS — D500 Iron deficiency anemia secondary to blood loss (chronic): Secondary | ICD-10-CM | POA: Diagnosis not present

## 2022-06-06 DIAGNOSIS — E43 Unspecified severe protein-calorie malnutrition: Secondary | ICD-10-CM | POA: Diagnosis not present

## 2022-06-06 DIAGNOSIS — I679 Cerebrovascular disease, unspecified: Secondary | ICD-10-CM | POA: Diagnosis not present

## 2022-06-06 DIAGNOSIS — F39 Unspecified mood [affective] disorder: Secondary | ICD-10-CM | POA: Diagnosis not present

## 2022-06-06 DIAGNOSIS — N184 Chronic kidney disease, stage 4 (severe): Secondary | ICD-10-CM | POA: Diagnosis not present

## 2022-06-07 DIAGNOSIS — F39 Unspecified mood [affective] disorder: Secondary | ICD-10-CM | POA: Diagnosis not present

## 2022-06-07 DIAGNOSIS — D500 Iron deficiency anemia secondary to blood loss (chronic): Secondary | ICD-10-CM | POA: Diagnosis not present

## 2022-06-07 DIAGNOSIS — E43 Unspecified severe protein-calorie malnutrition: Secondary | ICD-10-CM | POA: Diagnosis not present

## 2022-06-07 DIAGNOSIS — I679 Cerebrovascular disease, unspecified: Secondary | ICD-10-CM | POA: Diagnosis not present

## 2022-06-07 DIAGNOSIS — N184 Chronic kidney disease, stage 4 (severe): Secondary | ICD-10-CM | POA: Diagnosis not present

## 2022-06-07 DIAGNOSIS — I129 Hypertensive chronic kidney disease with stage 1 through stage 4 chronic kidney disease, or unspecified chronic kidney disease: Secondary | ICD-10-CM | POA: Diagnosis not present

## 2022-06-08 DIAGNOSIS — F39 Unspecified mood [affective] disorder: Secondary | ICD-10-CM | POA: Diagnosis not present

## 2022-06-08 DIAGNOSIS — I679 Cerebrovascular disease, unspecified: Secondary | ICD-10-CM | POA: Diagnosis not present

## 2022-06-08 DIAGNOSIS — N184 Chronic kidney disease, stage 4 (severe): Secondary | ICD-10-CM | POA: Diagnosis not present

## 2022-06-08 DIAGNOSIS — I129 Hypertensive chronic kidney disease with stage 1 through stage 4 chronic kidney disease, or unspecified chronic kidney disease: Secondary | ICD-10-CM | POA: Diagnosis not present

## 2022-06-08 DIAGNOSIS — D500 Iron deficiency anemia secondary to blood loss (chronic): Secondary | ICD-10-CM | POA: Diagnosis not present

## 2022-06-08 DIAGNOSIS — E43 Unspecified severe protein-calorie malnutrition: Secondary | ICD-10-CM | POA: Diagnosis not present

## 2022-06-10 DIAGNOSIS — D500 Iron deficiency anemia secondary to blood loss (chronic): Secondary | ICD-10-CM | POA: Diagnosis not present

## 2022-06-10 DIAGNOSIS — N184 Chronic kidney disease, stage 4 (severe): Secondary | ICD-10-CM | POA: Diagnosis not present

## 2022-06-10 DIAGNOSIS — I679 Cerebrovascular disease, unspecified: Secondary | ICD-10-CM | POA: Diagnosis not present

## 2022-06-10 DIAGNOSIS — F39 Unspecified mood [affective] disorder: Secondary | ICD-10-CM | POA: Diagnosis not present

## 2022-06-10 DIAGNOSIS — I129 Hypertensive chronic kidney disease with stage 1 through stage 4 chronic kidney disease, or unspecified chronic kidney disease: Secondary | ICD-10-CM | POA: Diagnosis not present

## 2022-06-10 DIAGNOSIS — E43 Unspecified severe protein-calorie malnutrition: Secondary | ICD-10-CM | POA: Diagnosis not present

## 2022-06-13 DIAGNOSIS — F39 Unspecified mood [affective] disorder: Secondary | ICD-10-CM | POA: Diagnosis not present

## 2022-06-13 DIAGNOSIS — I679 Cerebrovascular disease, unspecified: Secondary | ICD-10-CM | POA: Diagnosis not present

## 2022-06-13 DIAGNOSIS — I129 Hypertensive chronic kidney disease with stage 1 through stage 4 chronic kidney disease, or unspecified chronic kidney disease: Secondary | ICD-10-CM | POA: Diagnosis not present

## 2022-06-13 DIAGNOSIS — N184 Chronic kidney disease, stage 4 (severe): Secondary | ICD-10-CM | POA: Diagnosis not present

## 2022-06-13 DIAGNOSIS — D500 Iron deficiency anemia secondary to blood loss (chronic): Secondary | ICD-10-CM | POA: Diagnosis not present

## 2022-06-13 DIAGNOSIS — E43 Unspecified severe protein-calorie malnutrition: Secondary | ICD-10-CM | POA: Diagnosis not present

## 2022-06-14 DIAGNOSIS — D500 Iron deficiency anemia secondary to blood loss (chronic): Secondary | ICD-10-CM | POA: Diagnosis not present

## 2022-06-14 DIAGNOSIS — I129 Hypertensive chronic kidney disease with stage 1 through stage 4 chronic kidney disease, or unspecified chronic kidney disease: Secondary | ICD-10-CM | POA: Diagnosis not present

## 2022-06-14 DIAGNOSIS — I679 Cerebrovascular disease, unspecified: Secondary | ICD-10-CM | POA: Diagnosis not present

## 2022-06-14 DIAGNOSIS — N184 Chronic kidney disease, stage 4 (severe): Secondary | ICD-10-CM | POA: Diagnosis not present

## 2022-06-14 DIAGNOSIS — L89154 Pressure ulcer of sacral region, stage 4: Secondary | ICD-10-CM | POA: Diagnosis not present

## 2022-06-14 DIAGNOSIS — E43 Unspecified severe protein-calorie malnutrition: Secondary | ICD-10-CM | POA: Diagnosis not present

## 2022-06-14 DIAGNOSIS — F39 Unspecified mood [affective] disorder: Secondary | ICD-10-CM | POA: Diagnosis not present

## 2022-06-15 DIAGNOSIS — F39 Unspecified mood [affective] disorder: Secondary | ICD-10-CM | POA: Diagnosis not present

## 2022-06-15 DIAGNOSIS — N184 Chronic kidney disease, stage 4 (severe): Secondary | ICD-10-CM | POA: Diagnosis not present

## 2022-06-15 DIAGNOSIS — I679 Cerebrovascular disease, unspecified: Secondary | ICD-10-CM | POA: Diagnosis not present

## 2022-06-15 DIAGNOSIS — D500 Iron deficiency anemia secondary to blood loss (chronic): Secondary | ICD-10-CM | POA: Diagnosis not present

## 2022-06-15 DIAGNOSIS — I129 Hypertensive chronic kidney disease with stage 1 through stage 4 chronic kidney disease, or unspecified chronic kidney disease: Secondary | ICD-10-CM | POA: Diagnosis not present

## 2022-06-15 DIAGNOSIS — E43 Unspecified severe protein-calorie malnutrition: Secondary | ICD-10-CM | POA: Diagnosis not present

## 2022-06-16 DIAGNOSIS — N184 Chronic kidney disease, stage 4 (severe): Secondary | ICD-10-CM | POA: Diagnosis not present

## 2022-06-16 DIAGNOSIS — F39 Unspecified mood [affective] disorder: Secondary | ICD-10-CM | POA: Diagnosis not present

## 2022-06-16 DIAGNOSIS — I129 Hypertensive chronic kidney disease with stage 1 through stage 4 chronic kidney disease, or unspecified chronic kidney disease: Secondary | ICD-10-CM | POA: Diagnosis not present

## 2022-06-16 DIAGNOSIS — E43 Unspecified severe protein-calorie malnutrition: Secondary | ICD-10-CM | POA: Diagnosis not present

## 2022-06-16 DIAGNOSIS — D500 Iron deficiency anemia secondary to blood loss (chronic): Secondary | ICD-10-CM | POA: Diagnosis not present

## 2022-06-16 DIAGNOSIS — I679 Cerebrovascular disease, unspecified: Secondary | ICD-10-CM | POA: Diagnosis not present

## 2022-06-20 DIAGNOSIS — F03C0 Unspecified dementia, severe, without behavioral disturbance, psychotic disturbance, mood disturbance, and anxiety: Secondary | ICD-10-CM | POA: Diagnosis not present

## 2022-06-20 DIAGNOSIS — I71019 Dissection of thoracic aorta, unspecified: Secondary | ICD-10-CM | POA: Diagnosis not present

## 2022-06-20 DIAGNOSIS — N184 Chronic kidney disease, stage 4 (severe): Secondary | ICD-10-CM | POA: Diagnosis not present

## 2022-06-20 DIAGNOSIS — E1122 Type 2 diabetes mellitus with diabetic chronic kidney disease: Secondary | ICD-10-CM | POA: Diagnosis not present

## 2022-06-20 DIAGNOSIS — E46 Unspecified protein-calorie malnutrition: Secondary | ICD-10-CM | POA: Diagnosis not present

## 2022-06-20 DIAGNOSIS — F325 Major depressive disorder, single episode, in full remission: Secondary | ICD-10-CM | POA: Diagnosis not present

## 2022-06-20 DIAGNOSIS — R627 Adult failure to thrive: Secondary | ICD-10-CM | POA: Diagnosis not present

## 2022-06-24 DEATH — deceased
